# Patient Record
Sex: Female | Born: 1945 | Race: Black or African American | Hispanic: No | Marital: Married | Smoking: Former smoker
Health system: Southern US, Community
[De-identification: ages and names within clinical notes are randomized; demographics above are authoritative.]

## PROBLEM LIST (undated history)

## (undated) DIAGNOSIS — I10 Essential (primary) hypertension: Secondary | ICD-10-CM

## (undated) DIAGNOSIS — M1990 Unspecified osteoarthritis, unspecified site: Secondary | ICD-10-CM

## (undated) DIAGNOSIS — C50919 Malignant neoplasm of unspecified site of unspecified female breast: Secondary | ICD-10-CM

## (undated) DIAGNOSIS — Z803 Family history of malignant neoplasm of breast: Secondary | ICD-10-CM

## (undated) DIAGNOSIS — I639 Cerebral infarction, unspecified: Secondary | ICD-10-CM

## (undated) DIAGNOSIS — R0602 Shortness of breath: Secondary | ICD-10-CM

## (undated) DIAGNOSIS — K219 Gastro-esophageal reflux disease without esophagitis: Secondary | ICD-10-CM

## (undated) DIAGNOSIS — G4730 Sleep apnea, unspecified: Secondary | ICD-10-CM

## (undated) HISTORY — DX: Family history of malignant neoplasm of breast: Z80.3

## (undated) HISTORY — DX: Cerebral infarction, unspecified: I63.9

## (undated) HISTORY — PX: NO PAST SURGERIES: SHX2092

---

## 2001-02-09 ENCOUNTER — Ambulatory Visit (HOSPITAL_COMMUNITY): Admission: RE | Admit: 2001-02-09 | Discharge: 2001-02-09 | Payer: Self-pay | Admitting: Internal Medicine

## 2001-02-09 ENCOUNTER — Encounter: Payer: Self-pay | Admitting: Internal Medicine

## 2001-02-09 ENCOUNTER — Encounter: Admission: RE | Admit: 2001-02-09 | Discharge: 2001-02-09 | Payer: Self-pay | Admitting: Internal Medicine

## 2001-04-21 ENCOUNTER — Encounter: Admission: RE | Admit: 2001-04-21 | Discharge: 2001-04-21 | Payer: Self-pay | Admitting: Internal Medicine

## 2001-05-18 ENCOUNTER — Encounter: Admission: RE | Admit: 2001-05-18 | Discharge: 2001-05-18 | Payer: Self-pay | Admitting: Internal Medicine

## 2001-05-19 ENCOUNTER — Encounter: Payer: Self-pay | Admitting: Internal Medicine

## 2001-05-19 ENCOUNTER — Ambulatory Visit (HOSPITAL_COMMUNITY): Admission: RE | Admit: 2001-05-19 | Discharge: 2001-05-19 | Payer: Self-pay | Admitting: Internal Medicine

## 2001-05-19 ENCOUNTER — Encounter (INDEPENDENT_AMBULATORY_CARE_PROVIDER_SITE_OTHER): Payer: Self-pay | Admitting: Internal Medicine

## 2004-06-30 ENCOUNTER — Emergency Department (HOSPITAL_COMMUNITY): Admission: EM | Admit: 2004-06-30 | Discharge: 2004-06-30 | Payer: Self-pay | Admitting: Emergency Medicine

## 2004-07-06 ENCOUNTER — Ambulatory Visit: Payer: Self-pay | Admitting: Internal Medicine

## 2004-07-13 ENCOUNTER — Ambulatory Visit: Payer: Self-pay | Admitting: Internal Medicine

## 2004-07-31 ENCOUNTER — Ambulatory Visit (HOSPITAL_COMMUNITY): Admission: RE | Admit: 2004-07-31 | Discharge: 2004-07-31 | Payer: Self-pay | Admitting: Internal Medicine

## 2004-07-31 ENCOUNTER — Ambulatory Visit: Payer: Self-pay | Admitting: Internal Medicine

## 2004-08-08 ENCOUNTER — Ambulatory Visit (HOSPITAL_COMMUNITY): Admission: RE | Admit: 2004-08-08 | Discharge: 2004-08-08 | Payer: Self-pay | Admitting: Internal Medicine

## 2004-08-08 ENCOUNTER — Ambulatory Visit: Payer: Self-pay | Admitting: Cardiology

## 2004-08-14 ENCOUNTER — Ambulatory Visit: Payer: Self-pay | Admitting: Internal Medicine

## 2004-08-21 ENCOUNTER — Ambulatory Visit: Payer: Self-pay | Admitting: Internal Medicine

## 2004-09-04 ENCOUNTER — Ambulatory Visit: Payer: Self-pay | Admitting: Internal Medicine

## 2005-03-19 ENCOUNTER — Ambulatory Visit: Payer: Self-pay | Admitting: Hospitalist

## 2005-03-26 ENCOUNTER — Ambulatory Visit: Payer: Self-pay | Admitting: Hospitalist

## 2005-04-09 ENCOUNTER — Ambulatory Visit: Payer: Self-pay | Admitting: Internal Medicine

## 2006-04-30 DIAGNOSIS — F1021 Alcohol dependence, in remission: Secondary | ICD-10-CM

## 2006-04-30 DIAGNOSIS — K219 Gastro-esophageal reflux disease without esophagitis: Secondary | ICD-10-CM | POA: Insufficient documentation

## 2006-04-30 DIAGNOSIS — M545 Low back pain: Secondary | ICD-10-CM | POA: Insufficient documentation

## 2006-04-30 DIAGNOSIS — I10 Essential (primary) hypertension: Secondary | ICD-10-CM

## 2006-07-03 DIAGNOSIS — K5900 Constipation, unspecified: Secondary | ICD-10-CM

## 2006-07-03 DIAGNOSIS — Z87891 Personal history of nicotine dependence: Secondary | ICD-10-CM

## 2006-07-03 DIAGNOSIS — K649 Unspecified hemorrhoids: Secondary | ICD-10-CM

## 2011-12-12 ENCOUNTER — Encounter (HOSPITAL_COMMUNITY): Payer: Self-pay | Admitting: *Deleted

## 2011-12-12 ENCOUNTER — Emergency Department (HOSPITAL_COMMUNITY)
Admission: EM | Admit: 2011-12-12 | Discharge: 2011-12-13 | Disposition: A | Discharge status: Home Health | Payer: Medicare HMO | Attending: Emergency Medicine | Admitting: Emergency Medicine

## 2011-12-12 ENCOUNTER — Emergency Department (HOSPITAL_COMMUNITY): Payer: Medicare HMO

## 2011-12-12 DIAGNOSIS — Z79899 Other long term (current) drug therapy: Secondary | ICD-10-CM | POA: Insufficient documentation

## 2011-12-12 DIAGNOSIS — M129 Arthropathy, unspecified: Secondary | ICD-10-CM | POA: Insufficient documentation

## 2011-12-12 DIAGNOSIS — R5381 Other malaise: Secondary | ICD-10-CM | POA: Insufficient documentation

## 2011-12-12 DIAGNOSIS — K219 Gastro-esophageal reflux disease without esophagitis: Secondary | ICD-10-CM | POA: Insufficient documentation

## 2011-12-12 DIAGNOSIS — N390 Urinary tract infection, site not specified: Secondary | ICD-10-CM

## 2011-12-12 DIAGNOSIS — R42 Dizziness and giddiness: Secondary | ICD-10-CM | POA: Insufficient documentation

## 2011-12-12 DIAGNOSIS — I10 Essential (primary) hypertension: Secondary | ICD-10-CM | POA: Insufficient documentation

## 2011-12-12 DIAGNOSIS — R531 Weakness: Secondary | ICD-10-CM

## 2011-12-12 HISTORY — DX: Essential (primary) hypertension: I10

## 2011-12-12 HISTORY — DX: Unspecified osteoarthritis, unspecified site: M19.90

## 2011-12-12 HISTORY — DX: Gastro-esophageal reflux disease without esophagitis: K21.9

## 2011-12-12 LAB — CBC WITH DIFFERENTIAL/PLATELET
Basophils Absolute: 0.1 10*3/uL (ref 0.0–0.1)
Basophils Relative: 0 % (ref 0–1)
Eosinophils Absolute: 0.1 10*3/uL (ref 0.0–0.7)
Eosinophils Relative: 0 % (ref 0–5)
HCT: 40.2 % (ref 36.0–46.0)
Hemoglobin: 13 g/dL (ref 12.0–15.0)
Lymphocytes Relative: 11 % — ABNORMAL LOW (ref 12–46)
Lymphs Abs: 1.9 10*3/uL (ref 0.7–4.0)
MCH: 27.7 pg (ref 26.0–34.0)
MCHC: 32.3 g/dL (ref 30.0–36.0)
MCV: 85.7 fL (ref 78.0–100.0)
Monocytes Absolute: 1.5 10*3/uL — ABNORMAL HIGH (ref 0.1–1.0)
Monocytes Relative: 9 % (ref 3–12)
Neutro Abs: 13.7 10*3/uL — ABNORMAL HIGH (ref 1.7–7.7)
Neutrophils Relative %: 80 % — ABNORMAL HIGH (ref 43–77)
Platelets: 197 10*3/uL (ref 150–400)
RBC: 4.69 MIL/uL (ref 3.87–5.11)
RDW: 14.2 % (ref 11.5–15.5)
WBC: 17.2 10*3/uL — ABNORMAL HIGH (ref 4.0–10.5)

## 2011-12-12 LAB — URINALYSIS, ROUTINE W REFLEX MICROSCOPIC
Bilirubin Urine: NEGATIVE
Glucose, UA: NEGATIVE mg/dL
Ketones, ur: NEGATIVE mg/dL
Leukocytes, UA: NEGATIVE
Nitrite: NEGATIVE
Protein, ur: 30 mg/dL — AB
Specific Gravity, Urine: 1.018 (ref 1.005–1.030)
Urobilinogen, UA: 1 mg/dL (ref 0.0–1.0)
pH: 5.5 (ref 5.0–8.0)

## 2011-12-12 LAB — URINE MICROSCOPIC-ADD ON

## 2011-12-12 LAB — COMPREHENSIVE METABOLIC PANEL
ALT: 12 U/L (ref 0–35)
AST: 17 U/L (ref 0–37)
Albumin: 3.8 g/dL (ref 3.5–5.2)
Alkaline Phosphatase: 77 U/L (ref 39–117)
BUN: 17 mg/dL (ref 6–23)
CO2: 26 mEq/L (ref 19–32)
Calcium: 9.5 mg/dL (ref 8.4–10.5)
Chloride: 98 mEq/L (ref 96–112)
Creatinine, Ser: 1.23 mg/dL — ABNORMAL HIGH (ref 0.50–1.10)
GFR calc Af Amer: 52 mL/min — ABNORMAL LOW (ref >90)
GFR calc non Af Amer: 45 mL/min — ABNORMAL LOW (ref >90)
Glucose, Bld: 111 mg/dL — ABNORMAL HIGH (ref 70–99)
Potassium: 3.7 mEq/L (ref 3.5–5.1)
Sodium: 136 mEq/L (ref 135–145)
Total Bilirubin: 0.5 mg/dL (ref 0.3–1.2)
Total Protein: 8.7 g/dL — ABNORMAL HIGH (ref 6.0–8.3)

## 2011-12-12 MED ORDER — SODIUM CHLORIDE 0.9 % IV BOLUS (SEPSIS)
1000.0000 mL | Freq: Once | INTRAVENOUS | Status: AC
  Administered 2011-12-12: 1000 mL via INTRAVENOUS

## 2011-12-12 MED ORDER — CIPROFLOXACIN HCL 500 MG PO TABS: 500.0000 mg | ORAL_TABLET | Freq: Two times a day (BID) | ORAL | Status: AC

## 2011-12-12 MED ORDER — DEXTROSE 5 % IV SOLN
1.0000 g | Freq: Once | INTRAVENOUS | Status: AC
  Administered 2011-12-12: 1 g via INTRAVENOUS
  Filled 2011-12-12: qty 10

## 2011-12-12 NOTE — ED Notes (Signed)
Pt. Attempted to urinate, but unsuccessful. Will continue to monitor.

## 2011-12-12 NOTE — Discharge Instructions (Signed)
Fatigue Fatigue is a feeling of tiredness, lack of energy, lack of motivation, or feeling tired all the time. Having enough rest, good nutrition, and reducing stress will normally reduce fatigue. Consult your caregiver if it persists. The nature of your fatigue will help your caregiver to find out its cause. The treatment is based on the cause.  CAUSES  There are many causes for fatigue. Most of the time, fatigue can be traced to one or more of your habits or routines. Most causes fit into one or more of three general areas. They are: Lifestyle problems  Sleep disturbances.   Overwork.   Physical exertion.   Unhealthy habits.   Poor eating habits or eating disorders.   Alcohol and/or drug use .   Lack of proper nutrition (malnutrition).  Psychological problems  Stress and/or anxiety problems.   Depression.   Grief.   Boredom.  Medical Problems or Conditions  Anemia.   Pregnancy.   Thyroid gland problems.   Recovery from major surgery.   Continuous pain.   Emphysema or asthma that is not well controlled   Allergic conditions.   Diabetes.   Infections (such as mononucleosis).   Obesity.   Sleep disorders, such as sleep apnea.   Heart failure or other heart-related problems.   Cancer.   Kidney disease.   Liver disease.   Effects of certain medicines such as antihistamines, cough and cold remedies, prescription pain medicines, heart and blood pressure medicines, drugs used for treatment of cancer, and some antidepressants.  SYMPTOMS  The symptoms of fatigue include:   Lack of energy.   Lack of drive (motivation).   Drowsiness.   Feeling of indifference to the surroundings.  DIAGNOSIS  The details of how you feel help guide your caregiver in finding out what is causing the fatigue. You will be asked about your present and past health condition. It is important to review all medicines that you take, including prescription and non-prescription items. A  thorough exam will be done. You will be questioned about your feelings, habits, and normal lifestyle. Your caregiver may suggest blood tests, urine tests, or other tests to look for common medical causes of fatigue.  TREATMENT  Fatigue is treated by correcting the underlying cause. For example, if you have continuous pain or depression, treating these causes will improve how you feel. Similarly, adjusting the dose of certain medicines will help in reducing fatigue.  HOME CARE INSTRUCTIONS   Try to get the required amount of good sleep every night.   Eat a healthy and nutritious diet, and drink enough water throughout the day.   Practice ways of relaxing (including yoga or meditation).   Exercise regularly.   Make plans to change situations that cause stress. Act on those plans so that stresses decrease over time. Keep your work and personal routine reasonable.   Avoid street drugs and minimize use of alcohol.   Start taking a daily multivitamin after consulting your caregiver.  SEEK MEDICAL CARE IF:   You have persistent tiredness, which cannot be accounted for.   You have fever.   You have unintentional weight loss.   You have headaches.   You have disturbed sleep throughout the night.   You are feeling sad.   You have constipation.   You have dry skin.   You have gained weight.   You are taking any new or different medicines that you suspect are causing fatigue.   You are unable to sleep at night.     You develop any unusual swelling of your legs or other parts of your body.  SEEK IMMEDIATE MEDICAL CARE IF:   You are feeling confused.   Your vision is blurred.   You feel faint or pass out.   You develop severe headache.   You develop severe abdominal, pelvic, or back pain.   You develop chest pain, shortness of breath, or an irregular or fast heartbeat.   You are unable to pass a normal amount of urine.   You develop abnormal bleeding such as bleeding from  the rectum or you vomit blood.   You have thoughts about harming yourself or committing suicide.   You are worried that you might harm someone else.  MAKE SURE YOU:   Understand these instructions.   Will watch your condition.   Will get help right away if you are not doing well or get worse.  Document Released: 03/31/2007 Document Revised: 05/23/2011 Document Reviewed: 03/31/2007 [facility] Patient Information 2012 [place], [place].Urinary Tract Infection Infections of the urinary tract can start in several places. A bladder infection (cystitis), a kidney infection (pyelonephritis), and a prostate infection (prostatitis) are different types of urinary tract infections (UTIs). They usually get better if treated with medicines (antibiotics) that kill germs. Take all the medicine until it is gone. You or your child may feel better in a few days, but TAKE ALL MEDICINE or the infection may not respond and may become more difficult to treat. HOME CARE INSTRUCTIONS   Drink enough water and fluids to keep the urine clear or pale yellow. Cranberry juice is especially recommended, in addition to large amounts of water.   Avoid caffeine, tea, and carbonated beverages. They tend to irritate the bladder.   Alcohol may irritate the prostate.   Only take over-the-counter or prescription medicines for pain, discomfort, or fever as directed by your caregiver.  To prevent further infections:  Empty the bladder often. Avoid holding urine for long periods of time.   After a bowel movement, women should cleanse from front to back. Use each tissue only once.   Empty the bladder before and after sexual intercourse.  FINDING OUT THE RESULTS OF YOUR TEST Not all test results are available during your visit. If your or your child's test results are not back during the visit, make an appointment with your caregiver to find out the results. Do not assume everything is normal if you have not heard from your  caregiver or the medical facility. It is important for you to follow up on all test results. SEEK MEDICAL CARE IF:   There is back pain.   Your baby is older than 3 months with a rectal temperature of 100.5 F (38.1 C) or higher for more than 1 day.   Your or your child's problems (symptoms) are no better in 3 days. Return sooner if you or your child is getting worse.  SEEK IMMEDIATE MEDICAL CARE IF:   There is severe back pain or lower abdominal pain.   You or your child develops chills.   You have a fever.   Your baby is older than 3 months with a rectal temperature of 102 F (38.9 C) or higher.   Your baby is 8 months old or younger with a rectal temperature of 100.4 F (38 C) or higher.   There is nausea or vomiting.   There is continued burning or discomfort with urination.  MAKE SURE YOU:   Understand these instructions.   Will watch your condition.  Will get help right away if you are not doing well or get worse.  Document Released: 03/13/2005 Document Revised: 05/23/2011 Document Reviewed: 10/16/2006 [facility] Patient Information 2012 [place], [place].

## 2011-12-12 NOTE — ED Notes (Signed)
EKG old and new given to EDP, Beaton,MD.

## 2011-12-12 NOTE — ED Notes (Signed)
Pt states she has had frequent urination. Pt also c/o dizziness and weakness. Pt states it is becoming difficult for her swallowing. Pt daughter state patient was looking funny. Pt denies any loc

## 2011-12-13 NOTE — ED Provider Notes (Signed)
History    66yF with generalized fatigue. Onset a couple days ago. Persistent and stable. Urinating more than usual but no other urinary complaints. No fever or chills. No n/v/d. No trauma. NO new meds. Denies ingestion. Does endorse depression. No difficulty breathing but sometimes feels like things get stuck when swallows. No sore throat. No change in voice. No cp or sob. No acute numbness or tingling. No focal loss of strength. No HA. No acute visual chnages.   CSN: [id number]  Arrival date & time 12/12/11  4098   First MD Initiated Contact with Patient 12/12/11 1925      Chief Complaint  Patient presents with  . Dizziness  . Weakness    (Consider location/radiation/quality/duration/timing/severity/associated sxs/prior treatment) HPI  Past Medical History  Diagnosis Date  . GERD (gastroesophageal reflux disease)   . Arthritis   . Hypertension     History reviewed. No pertinent past surgical history.  No family history on file.  History  Substance Use Topics  . Smoking status: Never Smoker   . Smokeless tobacco: Not on file  . Alcohol Use: No    OB History    Grav Para Term Preterm Abortions TAB SAB Ect Mult Living                  Review of Systems   Review of symptoms negative unless otherwise noted in HPI.   Allergies  Review of patient's allergies indicates no known allergies.  Home Medications   Current Outpatient Rx  Name Route Sig Dispense Refill  . CIPROFLOXACIN HCL 500 MG PO TABS Oral Take 1 tablet (500 mg total) by mouth 2 (two) times daily. 14 tablet 0    BP 185/89  Pulse 87  Temp 100.1 F (37.8 C) (Rectal)  Resp 20  SpO2 94%  Physical Exam  Nursing note and vitals reviewed. Constitutional: She is oriented to person, place, and time. She appears well-developed and well-nourished. No distress.  HENT:  Head: Normocephalic and atraumatic.  Eyes: Conjunctivae are normal. Pupils are equal, round, and reactive to light. Right eye exhibits  no discharge. Left eye exhibits no discharge.  Neck: Normal range of motion. Neck supple.  Cardiovascular: Normal rate, regular rhythm and normal heart sounds.  Exam reveals no gallop and no friction rub.   No murmur heard. Pulmonary/Chest: Effort normal and breath sounds normal. No respiratory distress.  Abdominal: Soft. She exhibits no distension. There is no tenderness.  Genitourinary:       No cva tenderness.  Musculoskeletal: She exhibits no edema and no tenderness.  Lymphadenopathy:    She has no cervical adenopathy.  Neurological: She is alert and oriented to person, place, and time. No cranial nerve deficit. She exhibits normal muscle tone. Coordination normal.       Speech clear and content appropriate. Good finger to nose b/l. Gait steady.  Skin: Skin is warm and dry.  Psychiatric: She has a normal mood and affect. Her behavior is normal. Thought content normal.    ED Course  Procedures (including critical care time)  Labs Reviewed  URINALYSIS, ROUTINE W REFLEX MICROSCOPIC - Abnormal; Notable for the following:    APPearance CLOUDY (*)     Hgb urine dipstick TRACE (*)     Protein, ur 30 (*)     All other components within normal limits  CBC WITH DIFFERENTIAL - Abnormal; Notable for the following:    WBC 17.2 (*)     Neutrophils Relative 80 (*)  Neutro Abs 13.7 (*)     Lymphocytes Relative 11 (*)     Monocytes Absolute 1.5 (*)     All other components within normal limits  COMPREHENSIVE METABOLIC PANEL - Abnormal; Notable for the following:    Glucose, Bld 111 (*)     Creatinine, Ser 1.23 (*)     Total Protein 8.7 (*)     GFR calc non Af Amer 45 (*)     GFR calc Af Amer 52 (*)     All other components within normal limits  URINE MICROSCOPIC-ADD ON - Abnormal; Notable for the following:    Squamous Epithelial / LPF MANY (*)     Bacteria, UA FEW (*)     All other components within normal limits   Dg Chest 2 View  12/12/2011  *RADIOLOGY REPORT*  Clinical Data:  Fever with cough and dizziness.  Difficulty swallowing.  CHEST - 2 VIEW  Comparison: None.  Findings: Cardiomegaly.  Calcified tortuous aorta.  Mild vascular congestion without overt failure or infiltrates.  Degenerative change thoracic spine. No effusion or pneumothorax.  IMPRESSION: Cardiomegaly, mild vascular congestion.  No active infiltrates or overt failure.  Original Report Authenticated By: Elsie Stain, M.D.   EKG:  Rhythm: nsr Rate: 82 Axis: left Intervals: normal ST segments: NS St changes    1. UTI (lower urinary tract infection)   2. Generalized weakness       MDM  66yF with generalized weakness. Questionable UTI. Given fatigue and leukocytosis will tx. Do not feel that needs admit at this time. Nonfocal neuro exam. Handling PO in ED. Return precautions discussed.         Raeford Razor, MD 12/19/11 [phone]

## 2012-03-04 ENCOUNTER — Ambulatory Visit (HOSPITAL_COMMUNITY)
Admission: RE | Admit: 2012-03-04 | Discharge: 2012-03-04 | Disposition: A | Discharge status: Home / Self Care | Payer: Medicare HMO | Source: Ambulatory Visit | Attending: Orthopedic Surgery | Admitting: Orthopedic Surgery

## 2012-03-04 ENCOUNTER — Encounter: Payer: Self-pay | Admitting: Internal Medicine

## 2012-03-04 ENCOUNTER — Ambulatory Visit (INDEPENDENT_AMBULATORY_CARE_PROVIDER_SITE_OTHER): Payer: Medicare HMO | Admitting: Internal Medicine

## 2012-03-04 VITALS — BP 191/101 | HR 75 | Temp 98.1°F | Ht 64.0 in | Wt 232.1 lb

## 2012-03-04 DIAGNOSIS — R609 Edema, unspecified: Secondary | ICD-10-CM

## 2012-03-04 DIAGNOSIS — R0602 Shortness of breath: Secondary | ICD-10-CM

## 2012-03-04 DIAGNOSIS — R9431 Abnormal electrocardiogram [ECG] [EKG]: Secondary | ICD-10-CM | POA: Insufficient documentation

## 2012-03-04 DIAGNOSIS — M545 Low back pain, unspecified: Secondary | ICD-10-CM

## 2012-03-04 DIAGNOSIS — Z0000 Encounter for general adult medical examination without abnormal findings: Secondary | ICD-10-CM

## 2012-03-04 DIAGNOSIS — R5383 Other fatigue: Secondary | ICD-10-CM

## 2012-03-04 DIAGNOSIS — M25561 Pain in right knee: Secondary | ICD-10-CM

## 2012-03-04 DIAGNOSIS — R42 Dizziness and giddiness: Secondary | ICD-10-CM

## 2012-03-04 DIAGNOSIS — K219 Gastro-esophageal reflux disease without esophagitis: Secondary | ICD-10-CM

## 2012-03-04 DIAGNOSIS — G479 Sleep disorder, unspecified: Secondary | ICD-10-CM

## 2012-03-04 DIAGNOSIS — E669 Obesity, unspecified: Secondary | ICD-10-CM

## 2012-03-04 DIAGNOSIS — R5381 Other malaise: Secondary | ICD-10-CM

## 2012-03-04 DIAGNOSIS — R600 Localized edema: Secondary | ICD-10-CM | POA: Insufficient documentation

## 2012-03-04 DIAGNOSIS — I517 Cardiomegaly: Secondary | ICD-10-CM

## 2012-03-04 DIAGNOSIS — Z23 Encounter for immunization: Secondary | ICD-10-CM

## 2012-03-04 DIAGNOSIS — Z87891 Personal history of nicotine dependence: Secondary | ICD-10-CM

## 2012-03-04 DIAGNOSIS — M25569 Pain in unspecified knee: Secondary | ICD-10-CM

## 2012-03-04 DIAGNOSIS — I10 Essential (primary) hypertension: Secondary | ICD-10-CM

## 2012-03-04 DIAGNOSIS — N189 Chronic kidney disease, unspecified: Secondary | ICD-10-CM

## 2012-03-04 DIAGNOSIS — K644 Residual hemorrhoidal skin tags: Secondary | ICD-10-CM

## 2012-03-04 DIAGNOSIS — Z1231 Encounter for screening mammogram for malignant neoplasm of breast: Secondary | ICD-10-CM

## 2012-03-04 DIAGNOSIS — F1021 Alcohol dependence, in remission: Secondary | ICD-10-CM

## 2012-03-04 LAB — CBC
Hemoglobin: 13.1 g/dL (ref 12.0–15.0)
MCH: 27.4 pg (ref 26.0–34.0)
MCHC: 32.7 g/dL (ref 30.0–36.0)
RDW: 14.7 % (ref 11.5–15.5)
WBC: 7.6 10*3/uL (ref 4.0–10.5)

## 2012-03-04 LAB — LIPID PANEL
Cholesterol: 220 mg/dL — ABNORMAL HIGH (ref 0–200)
HDL: 36 mg/dL — ABNORMAL LOW (ref >39)
Total CHOL/HDL Ratio: 6.1 Ratio
VLDL: 33 mg/dL (ref 0–40)

## 2012-03-04 MED ORDER — METOPROLOL SUCCINATE ER 25 MG PO TB24: 12.5000 mg | ORAL_TABLET | Freq: Every day | ORAL | Status: DC

## 2012-03-04 MED ORDER — CHLORTHALIDONE 25 MG PO TABS: 12.5000 mg | ORAL_TABLET | Freq: Every day | ORAL | Status: DC

## 2012-03-04 NOTE — Assessment & Plan Note (Signed)
Left axis deviation on ekg, cardiomegaly on cxr. Likely secondary to uncontrolled chronic hypertension.  -Followup echo -Chest x-ray -Start hypertensive medication including metoprolol and chlorthalidone, will likely need to increase dosage with regular followup. -Continue to monitor

## 2012-03-04 NOTE — Assessment & Plan Note (Signed)
Trouble sleeping at night for several years. Claims to nap several times during the day and has trouble sleeping at night, uses 2-3 pillows and lays on her side.  Denies feeling short of breath or feeling like she cant catch her breath at night.  Possibly secondary to poor sleep hygiene vs. OSA vs. Orthopnea? Discussed improve methods of sleep hygiene, less napping during the day to improve sleep in bed at night, weight loss, and possible further investigation of sleep apnea if needed in the future.   -continue to monitor -f/u echo, cxr

## 2012-03-04 NOTE — Assessment & Plan Note (Addendum)
Possibly decreased breath sounds bilaterally, chronic shortness of breath with exertion even with short distances walking from car to clinic today is an example, tachycardia upon exertion, sleeps with 2-3 pillows at night and sleeps on side but does not sleep well, previous cxr from June 2013 shows cardiomegaly.  Hx of smoking in the 1970s, 1 pack/day for at least one year but then stopped. Associated with occasional dizziness.  May be secondary to CHF.   EKG 03/04/12 ABNORMAL: 69bpm NSR, left axis deviation, t wave changes minor flattening and inversions, leads I,V2, no chest pain.  Compared to last ekg from 11/2011  -f/u echo--scheduled for 03/10/12 -f/u cxr--can get done today -continue to monitor

## 2012-03-04 NOTE — Progress Notes (Signed)
Subjective:   Patient ID: [name] female   DOB: 06/18/1945 66 y.o.   MRN: [id number]  HPI: [name] is a 66 y.o. pleasant African American female with past medical history of uncontrolled hypertension who does not regularly followup with primary care physician and last hospital visit was in June 2013 for possible UTI treated with ciprofloxacin.  She also endorses a past medical history of acid reflux, lower back pain, and bilateral knee pain. She was previously prescribed hydrochlorothiazide 25 mg daily in 2003 with her last filled prescription noted to be in 2004. She claims to be non-adherent with medication and does not like taking medications, and did not regularly take her blood pressure medications. She does not check her blood pressure. She comes in today to reestablish care, after being encouraged to go to the doctor by her family members.  Today, [name] endorses occasional dizziness upon standing and walking, shortness of breath even on short distances for example walking from her car to clinic today, fatigue, lower back pain, bilateral knee pain, and lower extremity edema.  She claims to be having all these symptoms for several years. She does not claim that the shortness of breath has gotten worse, but does endorse sleeping with approximately 2-3 pillows at night, and does not sleep well throughout the night having to sleep on her side. She does nap several times throughout the day. She also reports weight gain most likely secondary to not following a healthy diet. She worked at [employer] for several years but is now currently getting disability secondary to chronic pain. She also endorses acid reflux worse at night especially when having a heavy and fatty meal. She does not like taking medications and does not want to take any unnecessary medications at this time.  Finally she notes two episodes in August 2013 of bright red blood streaking in her stool.  She thought it was secondary to her history of hemorrhoids. The bleeding spontaneously resolved and she denies any pain, constipation or diarrhea since.  She endorses a strong malodorous smell in her urine for several months now, but denies any dysuria, hematuria, urgency, frequency, or bladder pressure. She does not drink much if any water but does drink several sodas in particular Pepsi daily.  She reports no other major complaints at this time. She denies any fever, headaches, chest pain, nausea, vomiting, vision disturbances, sore throat, ear pain, and abdominal pain.  Because she has not been to a primary care physician in many years, I discussed with her in detail issues in regards to health maintenance. She has agreed to get her tetanus and pneumococcal vaccinations today but would like to defer flu vaccination until her next followup visit. She will also reconsider getting a Pap smear on followup visit. She did agree to get referrals for mammogram, colonoscopy, and DEXA scan. She is unsure if her insurance will cover her for Zostavax vaccine and would like to investigate further before her getting the prescription. I will also do baseline lab work and urinalysis if she can produce a sufficient sample. I discussed with her in detail in regards to weight loss, diet modifications, exercise as tolerated. And also went over her previous chest x-ray findings which showed cardiomegaly. I also discussed extensively nonadherence with medications and the need to properly adhere to her blood pressure medications which we will start this visit. Her blood pressure in clinic today was noted to be 191/101.  Dr. Kem Kays and I discussed with  her in detail in regards to the possibility of heart failure secondary to the symptoms she is exhibiting in particular shortness of breath upon exertion, left axis deviation on EKG, cardiomegaly on chest x-ray, lower extremity edema, and fatigue. We will need to run more tests for further  examination and she is in agreement.  Past Medical History  Diagnosis Date  . GERD (gastroesophageal reflux disease)   . Arthritis   . Hypertension    Current Outpatient Prescriptions  Medication Sig Dispense Refill  . chlorthalidone (HYGROTON) 25 MG tablet Take 0.5 tablets (12.5 mg total) by mouth daily.  30 tablet  5  . metoprolol succinate (TOPROL XL) 25 MG 24 hr tablet Take 0.5 tablets (12.5 mg total) by mouth daily.  30 tablet  11   History reviewed. No pertinent family history. History   Social History  . Marital Status: Married    Spouse Name: N/A    Number of Children: N/A  . Years of Education: N/A   Social History Main Topics  . Smoking status: Former Smoker    Types: Cigarettes    Start date: 06/17/1968  . Smokeless tobacco: None  . Alcohol Use: No  . Drug Use: No  . Sexually Active: No   Other Topics Concern  . None   Social History Narrative  . None   Review of Systems: Constitutional:  Fatigue.Denies fever, chills, diaphoresis, appetite change. HEENT:  Difficulty hearing.Denies photophobia, eye pain, redness, ear pain, congestion, sore throat, rhinorrhea, sneezing, mouth sores, trouble swallowing, neck pain, neck stiffness and tinnitus.   Respiratory:  Shortness of breath and dyspnea on exertion.Denies cough, chest tightness,  and wheezing.   Cardiovascular:  Leg swelling, occasional heart flutter. Denies chest pain, palpitations.  Gastrointestinal:  Acid reflux, history of hemorrhoids with one episode in August of bright red blood streaks in stool.Denies nausea, vomiting, abdominal pain, diarrhea, constipation,and abdominal distention.  Genitourinary:  Malodorous urine.Denies dysuria, urgency, frequency, hematuria, flank pain and difficulty urinating.  Musculoskeletal:  Chronic lower back pain and bilateral knee pain, bilateral hand and feet swelling, and difficulty walking. Skin: Denies pallor, rash and wound.  Neurological:  Occasional dizziness. Denies  seizures, syncope, weakness, light-headedness, numbness and headaches.  Hematological: Denies adenopathy. Easy bruising, personal or family bleeding history  Psychiatric/Behavioral: Sleep disturbances at night. Denies suicidal ideation, mood changes, confusion, nervousness, and agitation.  Objective:  Physical Exam: Filed Vitals:   03/04/12 1444 03/04/12 1459 03/04/12 1500 03/04/12 1502  BP: 197/90 182/94 191/96 191/101  Pulse: 71 73 96 75  Temp: 98.1 F (36.7 C)     TempSrc: Oral     Height: 5\' 4"  (1.626 m)     Weight: 232 lb 1.6 oz (105.28 kg)     SpO2: 95%      Constitutional: Vital signs reviewed.  Patient is a well-developed and well-nourished female in no acute distress and cooperative with exam. Alert and oriented x3.  Head: Normocephalic and atraumatic Ear: TM normal bilaterally Mouth: no erythema or exudates, MMM Eyes: PERRLA, EOMI, conjunctivae normal, No scleral icterus.  Neck: Supple, Trachea midline normal ROM Cardiovascular: RRR, S1 normal, S2 normal, +  2/6 holosystolic murmur most prominent left sternal border ,pulses symmetric and intact bilaterally Pulmonary/Chest: CTAB, possible decreased breath sounds bilaterally in upper lobes, no wheezes, rales, or rhonchi Abdominal: Soft.  Obese,Non-tender, non-distended, bowel sounds are normal, no masses, organomegaly, or guarding present.  GU: no CVA tenderness Musculoskeletal:  +1 pitting edema bilateral lower extremities, mild edema of bilateral hands, ROM  full and nontender, + 2D/P b/l Neurological: A&O x3, Strength is normal and symmetric bilaterally, cranial nerve II-XII are grossly intact, no focal motor deficit, sensory intact to light touch bilaterally.  Skin: Warm, dry and intact. No rash, cyanosis, or clubbing.  Psychiatric: Normal mood and affect. speech and behavior is normal. Judgment and thought content normal. Cognition and memory are normal.   Assessment & Plan:  Case discussed with Dr. Kem Kays  Appointment  for re-establishment of care.  HTN: started on metoprolol xl and chlorthalidone 12.5mg  PO QD Possible CHF with symptoms of dyspnea on exertion, fatigue, b/l lower extremity edema, and cardiomegaly--repeat ekg done, f/u echo, f/u cxr CKD: unable to give urine sample today, recollect next visit, f/u cmp, urine protein/cr Health maintenance: Tdap and pneumococcal vaccine given today, influenza and pap smear deferred to next visit by patient, referrals for mammogram, colonoscopy, and dexa scan given today.    F/u cbc, cmp, tsh, echo, cxr, lipid panel, and f/u visit 3 weeks--recheck bmp and K+ level at that time s/p started chlorthalidone

## 2012-03-04 NOTE — Assessment & Plan Note (Signed)
Claims to have lower back pain for several years in addition to bilateral knee pain. Denies any trauma. Endorses having difficulty moving around for the past several years which she thinks is likely secondary to old age.   -Will continue to monitor

## 2012-03-04 NOTE — Patient Instructions (Signed)
Please go for echocardiogram of your heart and chest xray as scheduled as soon as possible  Please start taking your blood pressure medicine DAILY: Metoprolol and Chlorthalidone once a day each as prescribed, they have been sent to your pharmacy.  If metoprolol is not covered by insurance call the clinic and let us know and we can adjust it as soon as possible.   RETURN TO CLINIC FOR FOLLOW UP APPOINTMENT WITH ME IN 3 WEEKS--we will check your blood test again for potassium level at that time and get a urine sample  Continue drinking lots of water as we discussed  Please follow up with your appointments for colonoscopy, mammogram, and dexa scan (bone test)  Please continue to try to lose weight, portion control, cutting out soda's as much as possible  I will call you with your results for all your tests  We will do flu shot next visit and maybe papsmear if you like  Today you got your pneumococcal and Tdap vaccinations in clinic  Please take care of yourself and see you soon.  Let me know if you have any questions or concerns

## 2012-03-04 NOTE — Assessment & Plan Note (Addendum)
Creatinine noted to be 1.23 in June 2013 when visiting the hospital for possible UTI and found to have leukocytosis of 17 and temp of 100 F, given ciprofloxacin at that time upon discharged .  Possibly secondary to uncontrolled hypertension.    -Followup CMP -Was not able to produce urine sample for urinalysis today, will recollect for urinalysis on next followup visit, urine protein to creatinine ratio also on followup visit -Continue to monitor

## 2012-03-04 NOTE — Assessment & Plan Note (Signed)
Stopped drinking approximately 10 years ago. She denies heavy use of alcohol in the past but does claim to drink occasionally on the weekends before quitting.

## 2012-03-04 NOTE — Assessment & Plan Note (Addendum)
Chronic uncontrolled hypertension. Was previously on hydrochlorothiazide 25 mg in 2003 which she claims to take on and off until 2004 which is the last time she had a prescription filled.  Blood pressure in clinic today noted to be 197/90. Orthostatic vital signs done showing blood pressure 182/94 with a pulse of 73 when lying and blood pressure 191/96 with a heart rate of 96 when sitting.  Also complains of shortness of breath and occasional dizziness. Shortness of breath is worse upon exertion even with short distances but not worsening. Dizziness occurs sometimes upon standing and usually when walking.  Blood pressure in June 2013 was noted to be 185/89. [name] claims to not like taking medication which is why she has been nonadherent in the past. I discussed with her extensively the importance of taking her medications and the effects of long-term uncontrolled hypertension. She is agreed to be adherent with her medication. I will continue to follow her closely in clinic. We will start her on antihypertensive medication today.  -Start Toprol-XL 12.5 mg daily and chlorthalidone 12.5 mg daily; will likely need to adjust dosage with regular follow-up.   -Counseled on weight loss and diet modifications, reducing sodium intake as much as possible -Will continue to monitor -Return to clinic for followup in 3 weeks

## 2012-03-04 NOTE — Assessment & Plan Note (Signed)
Claims to have chronic bilateral lower extremity edema. On physical exam noted to have +1 pitting edema of bilateral legs.  Possibly secondary to congestive heart failure in the setting of uncontrolled chronic hypertension.  Will also check thyroid function.  -Followup TSH -Followup echo -Counseled on weight loss and advised to put her feet up when sitting -Started on chlorthalidone for hypertension, was previously on hydrochlorothiazide in 2003-2004 been nonadherent with medication -Continue to monitor

## 2012-03-04 NOTE — Assessment & Plan Note (Signed)
Used to smoke approximately 2 packs a day in the 1970s for at least one year and then quit.  No recent smoking history.

## 2012-03-04 NOTE — Assessment & Plan Note (Signed)
Claims to have fatigue with decreased energy for several years and associated with shortness of breath and lower extremity edema and weight gain. Possibly secondary to hypothyroidism versus congestive heart failure versus anemia?  -Followup CBC -Followup echo -Followup TSH -Continue to monitor

## 2012-03-04 NOTE — Assessment & Plan Note (Addendum)
Claims to be told that she had hemorrhoids since the birth of her last child.  She denies any pain. She does however endorse 2 days of occasional bright red streaking of blood in stool in August 2013 that spontaneously resolved. She thought it was likely secondary to hemorrhoids. She denies any constipation or diarrhea. I discussed with her colonoscopy screening in which she's in agreement with.  I will refer her during this visit.  No other episodes of bloody bowel movements since August 2013.  -Followup colonoscopy -Continue to monitor

## 2012-03-04 NOTE — Assessment & Plan Note (Addendum)
Intermittent, worse with exertion at times and also upon standing at times.  Spontaneously resolves quickly.  Orthostatic vital signs done in clinic today: 182/94 lying and 191/96 sitting.  Hx of uncontrolled chronic hypertension, cardiomegaly, and shortness of breath worse with exertion.    -f/u lab work -started on antihypertensive medication today--metoprolol and chlorthalidone, expressed importance of daily adherence to medication -continue to monitor -encouraged to increase water intake and decrease soda intake -f/u echo

## 2012-03-04 NOTE — Assessment & Plan Note (Signed)
Counseled on weight loss, exercise, diet control and modifications. Endorses drinking plenty of sodas nondiet daily with very little water intake.  Claims to have significant weight gain over the past few years along with decreased exercise.  Weight was 232 pounds today  -Followup lipid panel -Discussed dietary changes, exercise, weight loss; she claims she will continue to work on it -Will continue to monitor

## 2012-03-04 NOTE — Assessment & Plan Note (Signed)
Claims to have had acid reflex worse with fatty foods and occasionally at night when she eats late. She does not wish to take any medication for her acid reflux at this time. I presented her with several options and will likely discuss with her on followup visit. She does not like to take medication. I counseled her on diet modifications, portion control, 80 and regularly and having dinner earlier at night.  -Will continue to monitor

## 2012-03-04 NOTE — Assessment & Plan Note (Addendum)
Claims to have long history of bilateral knee pain. She claims that she has difficulty walking for the past few years, but thinks that is mainly due to age. I discussed with her the availability of DEXA scanning which she is interested in and would like to do I will refer her this visit.  Bilateral knee x-rays from 2002 show:1. LARGE EFFUSION ON THE LEFT. 2. MILD DEGENERATIVE CHANGES ON THE LEFT AND MINIMAL DEGENERATIVE CHANGES ON THE RIGHT.  -Followup DEXA scan -Continue to monitor her -Takes Tylenol when necessary for pain

## 2012-03-05 LAB — COMPLETE METABOLIC PANEL WITH GFR
AST: 12 U/L (ref 0–37)
Albumin: 4.3 g/dL (ref 3.5–5.2)
Alkaline Phosphatase: 63 U/L (ref 39–117)
BUN: 20 mg/dL (ref 6–23)
Calcium: 9.6 mg/dL (ref 8.4–10.5)
GFR, Est Non African American: 50 mL/min — ABNORMAL LOW
Glucose, Bld: 89 mg/dL (ref 70–99)
Potassium: 4.1 mEq/L (ref 3.5–5.3)
Sodium: 141 mEq/L (ref 135–145)
Total Bilirubin: 0.6 mg/dL (ref 0.3–1.2)
Total Protein: 7.9 g/dL (ref 6.0–8.3)

## 2012-03-09 NOTE — Progress Notes (Signed)
INTERNAL MEDICINE TEACHING ATTENDING ADDENDUM - Jonah Blue, DO: I personally saw and evaluated [name] in this clinic visit in conjunction with the resident, Dr. Virgina Organ. I have discussed patient's plan of care with medical resident during this visit. I have confirmed the physical exam findings and have read and agree with the clinic note including the plan.

## 2012-03-10 ENCOUNTER — Ambulatory Visit (HOSPITAL_COMMUNITY)
Admission: RE | Admit: 2012-03-10 | Discharge: 2012-03-10 | Disposition: A | Discharge status: Home / Self Care | Payer: Medicare HMO | Source: Ambulatory Visit | Attending: Internal Medicine | Admitting: Internal Medicine

## 2012-03-10 DIAGNOSIS — I10 Essential (primary) hypertension: Secondary | ICD-10-CM | POA: Insufficient documentation

## 2012-03-10 DIAGNOSIS — R0602 Shortness of breath: Secondary | ICD-10-CM | POA: Insufficient documentation

## 2012-03-10 DIAGNOSIS — Z0000 Encounter for general adult medical examination without abnormal findings: Secondary | ICD-10-CM

## 2012-03-10 DIAGNOSIS — I059 Rheumatic mitral valve disease, unspecified: Secondary | ICD-10-CM | POA: Insufficient documentation

## 2012-03-10 DIAGNOSIS — I517 Cardiomegaly: Secondary | ICD-10-CM

## 2012-03-10 NOTE — Progress Notes (Signed)
Echocardiogram 2D Echocardiogram has been performed.  [name] 03/10/2012, 12:56 PM

## 2012-03-20 ENCOUNTER — Ambulatory Visit (HOSPITAL_COMMUNITY)
Admission: RE | Admit: 2012-03-20 | Discharge: 2012-03-20 | Disposition: A | Discharge status: Home / Self Care | Payer: Medicare HMO | Source: Ambulatory Visit | Attending: Internal Medicine | Admitting: Internal Medicine

## 2012-03-20 DIAGNOSIS — Z1231 Encounter for screening mammogram for malignant neoplasm of breast: Secondary | ICD-10-CM | POA: Insufficient documentation

## 2012-03-20 DIAGNOSIS — Z0000 Encounter for general adult medical examination without abnormal findings: Secondary | ICD-10-CM

## 2012-03-24 ENCOUNTER — Telehealth: Payer: Self-pay | Admitting: Internal Medicine

## 2012-03-24 ENCOUNTER — Other Ambulatory Visit: Payer: Self-pay | Admitting: Internal Medicine

## 2012-03-24 DIAGNOSIS — N63 Unspecified lump in unspecified breast: Secondary | ICD-10-CM

## 2012-03-24 NOTE — Progress Notes (Signed)
Have this patient seen [place] in the next month. She needs to have U/S performed.

## 2012-03-24 NOTE — Progress Notes (Signed)
Has appt 10/9, dr Everardo Beals

## 2012-03-24 NOTE — Telephone Encounter (Signed)
Telephone Call Addendum:  I called and spoke to [name] this evening to go over the results of her echocardiogram, lab results, cxr, and mammogram.  She understands all her results and is scheduled to come to the clinic tomorrow for her regular follow up appointment with Dr. Everardo Beals in the morning as well.  I reviewed her mammogram findings in details and she claims she was already contacted by the breast center to schedule follow up diagnostic mammogram and ultrasound which she is scheduled to get on Friday 03/27/12.  Otherwise, she claims to be doing well, taking her medications for the most part.  She does admit to missing some days on her BP medication but is back on track.   We will continue to follow up.

## 2012-03-25 ENCOUNTER — Encounter: Payer: Self-pay | Admitting: Internal Medicine

## 2012-03-25 ENCOUNTER — Ambulatory Visit (INDEPENDENT_AMBULATORY_CARE_PROVIDER_SITE_OTHER): Payer: Medicare HMO | Admitting: Internal Medicine

## 2012-03-25 VITALS — BP 181/91 | HR 66 | Temp 97.2°F | Wt 234.1 lb

## 2012-03-25 DIAGNOSIS — I10 Essential (primary) hypertension: Secondary | ICD-10-CM

## 2012-03-25 NOTE — Patient Instructions (Signed)
-  Please take your medication everyday.  Continue the same dose (half a tablet of each).  Incorporate taking your medication into your daily routine - for example, take it everyday with your coffee or when you brush your teeth.  Make sure you take your medication at the same time everyday.  Please be sure to bring all of your medications with you to every visit.  Should you have any new or worsening symptoms, please be sure to call the clinic at [phone].

## 2012-03-25 NOTE — Progress Notes (Signed)
  Subjective:   Patient ID: [name] female   DOB: June 30, 1945 66 y.o.   MRN: [id number]  HPI: [name] is a 66 y.o. woman with h/o HTN, arthritis & GERD presents today for HTN follow up.  Dizziness improved, no complaints and knows about diagnositic mammo on friday  No vision changes / sob  Some sleepiness with medication - missing some doses, no meds today, taking half of both chlorthalidone and metoprolol  Past Medical History  Diagnosis Date  . GERD (gastroesophageal reflux disease)   . Arthritis   . Hypertension    Current Outpatient Prescriptions  Medication Sig Dispense Refill  . chlorthalidone (HYGROTON) 25 MG tablet Take 0.5 tablets (12.5 mg total) by mouth daily.  30 tablet  5  . metoprolol succinate (TOPROL XL) 25 MG 24 hr tablet Take 0.5 tablets (12.5 mg total) by mouth daily.  30 tablet  11   Family History  Problem Relation Age of Onset  . Heart disease Mother   . Cancer Brother    History   Social History  . Marital Status: Married    Spouse Name: N/A    Number of Children: N/A  . Years of Education: N/A   Social History Main Topics  . Smoking status: Former Smoker -- 2.0 packs/day    Types: Cigarettes    Start date: 06/17/1968  . Smokeless tobacco: None   Comment: Quit in 1970s  . Alcohol Use: No     former drinker for approximately 10 years  . Drug Use: No  . Sexually Active: Yes    Birth Control/ Protection: Post-menopausal   Other Topics Concern  . None   Social History Narrative  . None   Review of Systems: General: no fevers, chills, changes in weight, changes in appetite Skin: no rash HEENT: no blurry vision, hearing changes, sore throat Pulm: no dyspnea, coughing, wheezing CV: no chest pain, palpitations, shortness of breath Abd: no abdominal pain, nausea/vomiting, diarrhea/constipation GU: no dysuria, hematuria, polyuria Ext: no arthralgias, myalgias Neuro: no weakness, numbness, or tingling  Objective:    Physical Exam: Filed Vitals:   03/25/12 1048  BP: 181/91  Pulse: 66  Temp: 97.2 F (36.2 C)  TempSrc: Oral  Weight: 234 lb 1.6 oz (106.187 kg)   Constitutional: Vital signs reviewed.  Patient is a well-developed and well-nourished woman in no acute distress and cooperative with exam. Head: Normocephalic and atraumatic Mouth: no erythema or exudates, MMM Eyes: PERRL, EOMI, conjunctivae normal, No scleral icterus.  Neck: Supple, Trachea midline normal ROM, No JVD, mass, thyromegaly, or carotid bruit present.  Cardiovascular: RRR, S1 normal, S2 normal, +systolic murmur at left sternal border, pulses symmetric and intact bilaterally, 1+ b/l LE pitting edema Pulmonary/Chest: CTAB, no wheezes, rales, or rhonchi Abdominal: Soft. Non-tender, non-distended, bowel sounds are normal, no masses, organomegaly, or guarding present.  Neurological: A&O x3 Skin: Warm, dry and intact. No rash, cyanosis, or clubbing.  Psychiatric: Normal mood and affect. speech and behavior is normal. Judgment and thought content normal. Cognition and memory are normal.   Assessment & Plan:  Case and care discussed with Dr. Kem Kays. Please see problem oriented charting for further details. Patient to return in 2 weeks for BP follow up and bmet.

## 2012-03-25 NOTE — Assessment & Plan Note (Signed)
Patient has been nonadherent to medications. She should take medication everyday at the same time daily for the next two weeks and then return for BP check & BMET.  She voices understanding.

## 2012-03-27 ENCOUNTER — Ambulatory Visit
Admission: RE | Admit: 2012-03-27 | Discharge: 2012-03-27 | Disposition: A | Discharge status: Home / Self Care | Payer: Medicare HMO | Source: Ambulatory Visit | Attending: Internal Medicine | Admitting: Internal Medicine

## 2012-03-27 DIAGNOSIS — N63 Unspecified lump in unspecified breast: Secondary | ICD-10-CM

## 2012-04-08 ENCOUNTER — Encounter: Payer: Self-pay | Admitting: Internal Medicine

## 2012-04-08 ENCOUNTER — Ambulatory Visit (INDEPENDENT_AMBULATORY_CARE_PROVIDER_SITE_OTHER): Payer: Medicare HMO | Admitting: Internal Medicine

## 2012-04-08 VITALS — BP 166/82 | HR 66 | Temp 98.3°F | Wt 233.7 lb

## 2012-04-08 DIAGNOSIS — I10 Essential (primary) hypertension: Secondary | ICD-10-CM

## 2012-04-08 LAB — BASIC METABOLIC PANEL
CO2: 32 mEq/L (ref 19–32)
Calcium: 9.3 mg/dL (ref 8.4–10.5)
Creat: 1.27 mg/dL — ABNORMAL HIGH (ref 0.50–1.10)
Glucose, Bld: 101 mg/dL — ABNORMAL HIGH (ref 70–99)

## 2012-04-08 NOTE — Patient Instructions (Signed)
-  Continue taking your medications every day as you have been.  Great job keeping up with the routine!  Please be sure to bring all of your medications with you to every visit.  Should you have any new or worsening symptoms, please be sure to call the clinic at [phone].

## 2012-04-08 NOTE — Progress Notes (Signed)
  Subjective:   Patient ID: [name] female   DOB: 04-Jun-1946 66 y.o.   MRN: [id number]  HPI: [name] is a 66 y.o. woman with h/o HTN, arthritis & GERD presents today for HTN follow up.  Continues to report dizziness with medications, but not for long.  No vision changes/ SOB/CP.  Reports fatigue/sleepiness with medications again today.   Past Medical History  Diagnosis Date  . GERD (gastroesophageal reflux disease)   . Arthritis   . Hypertension    Current Outpatient Prescriptions  Medication Sig Dispense Refill  . chlorthalidone (HYGROTON) 25 MG tablet Take 0.5 tablets (12.5 mg total) by mouth daily.  30 tablet  5  . metoprolol succinate (TOPROL XL) 25 MG 24 hr tablet Take 0.5 tablets (12.5 mg total) by mouth daily.  30 tablet  11   Family History  Problem Relation Age of Onset  . Heart disease Mother   . Cancer Brother    History   Social History  . Marital Status: Married    Spouse Name: N/A    Number of Children: N/A  . Years of Education: N/A   Social History Main Topics  . Smoking status: Former Smoker -- 2.0 packs/day    Types: Cigarettes    Start date: 06/17/1968  . Smokeless tobacco: Not on file   Comment: Quit in 1970s  . Alcohol Use: No     former drinker for approximately 10 years  . Drug Use: No  . Sexually Active: Yes    Birth Control/ Protection: Post-menopausal   Other Topics Concern  . Not on file   Social History Narrative  . No narrative on file   Review of Systems: General: no fevers, chills, changes in weight, changes in appetite  Skin: no rash  HEENT: no blurry vision, hearing changes, sore throat  Pulm: no dyspnea, coughing, wheezing  CV: no chest pain, palpitations, shortness of breath  Abd: no abdominal pain, nausea/vomiting, diarrhea/constipation  GU: no dysuria, hematuria, polyuria  Ext: no arthralgias, myalgias  Neuro: no weakness, numbness, or tingling   Objective:  Physical Exam: Filed Vitals:   04/08/12 1008 04/08/12 1025  BP: 192/95 166/82  Pulse: 66 66  Temp: 98.3 F (36.8 C)   TempSrc: Oral   Weight: 233 lb 11.2 oz (106.006 kg)     Constitutional: Vital signs reviewed. Patient is a well-developed and well-nourished woman in no acute distress and cooperative with exam.  Head: Normocephalic and atraumatic  Mouth: no erythema or exudates, MMM  Eyes: PERRL, EOMI, conjunctivae normal, No scleral icterus.  Neck: Supple, Trachea midline normal ROM, No JVD, mass, thyromegaly, or carotid bruit present.  Cardiovascular: RRR, S1 normal, S2 normal, +systolic murmur at left sternal border, pulses symmetric and intact bilaterally, 1+ b/l LE pitting edema  Pulmonary/Chest: CTAB, no wheezes, rales, or rhonchi  Abdominal: Soft. Non-tender, non-distended, bowel sounds are normal, no masses, organomegaly, or guarding present.  Neurological: A&O x3  Skin: Warm, dry and intact. No rash, cyanosis, or clubbing.  Psychiatric: Normal mood and affect. speech and behavior is normal. Judgment and thought content normal. Cognition and memory are normal.    Assessment & Plan:  Case and care discussed with Dr. Criselda Peaches. Please see problem oriented charting for further details. Patient to return in 1 month for BP follow up.

## 2012-04-08 NOTE — Assessment & Plan Note (Signed)
Patient has been adherent to medications.  Given complaints of dizziness/fatigue & patient has not taken meds today (daily at 11am), I will not change regimen at this time.  I will give her another month to adjust to daily medications and have her return, at which point we can consider increasing chlorthalidone to 25mg  daily.  BMET today.

## 2012-04-22 NOTE — Addendum Note (Signed)
Addended by: Bufford Spikes on: 04/22/2012 10:39 AM   Modules accepted: Orders

## 2012-05-08 ENCOUNTER — Ambulatory Visit: Payer: Medicare HMO | Admitting: Internal Medicine

## 2012-08-12 ENCOUNTER — Encounter: Payer: Self-pay | Admitting: Internal Medicine

## 2014-02-02 ENCOUNTER — Emergency Department (HOSPITAL_COMMUNITY): Payer: Medicare HMO

## 2014-02-02 ENCOUNTER — Inpatient Hospital Stay (HOSPITAL_COMMUNITY)
Admission: EM | Admit: 2014-02-02 | Discharge: 2014-02-08 | DRG: 065 | Disposition: A | Discharge status: Rehabilitation Facility | Payer: Medicare HMO | Attending: Neurology | Admitting: Neurology

## 2014-02-02 DIAGNOSIS — Z8249 Family history of ischemic heart disease and other diseases of the circulatory system: Secondary | ICD-10-CM

## 2014-02-02 DIAGNOSIS — I10 Essential (primary) hypertension: Secondary | ICD-10-CM

## 2014-02-02 DIAGNOSIS — I619 Nontraumatic intracerebral hemorrhage, unspecified: Principal | ICD-10-CM | POA: Diagnosis present

## 2014-02-02 DIAGNOSIS — E669 Obesity, unspecified: Secondary | ICD-10-CM | POA: Diagnosis present

## 2014-02-02 DIAGNOSIS — Z823 Family history of stroke: Secondary | ICD-10-CM

## 2014-02-02 DIAGNOSIS — R471 Dysarthria and anarthria: Secondary | ICD-10-CM | POA: Diagnosis present

## 2014-02-02 DIAGNOSIS — Z8673 Personal history of transient ischemic attack (TIA), and cerebral infarction without residual deficits: Secondary | ICD-10-CM | POA: Diagnosis present

## 2014-02-02 DIAGNOSIS — I159 Secondary hypertension, unspecified: Secondary | ICD-10-CM

## 2014-02-02 DIAGNOSIS — R29898 Other symptoms and signs involving the musculoskeletal system: Secondary | ICD-10-CM | POA: Diagnosis not present

## 2014-02-02 DIAGNOSIS — Z87891 Personal history of nicotine dependence: Secondary | ICD-10-CM

## 2014-02-02 DIAGNOSIS — R29810 Facial weakness: Secondary | ICD-10-CM | POA: Diagnosis present

## 2014-02-02 DIAGNOSIS — I629 Nontraumatic intracranial hemorrhage, unspecified: Secondary | ICD-10-CM | POA: Diagnosis present

## 2014-02-02 DIAGNOSIS — I610 Nontraumatic intracerebral hemorrhage in hemisphere, subcortical: Secondary | ICD-10-CM

## 2014-02-02 DIAGNOSIS — Z6838 Body mass index (BMI) 38.0-38.9, adult: Secondary | ICD-10-CM

## 2014-02-02 DIAGNOSIS — K219 Gastro-esophageal reflux disease without esophagitis: Secondary | ICD-10-CM | POA: Diagnosis present

## 2014-02-02 DIAGNOSIS — E785 Hyperlipidemia, unspecified: Secondary | ICD-10-CM | POA: Diagnosis present

## 2014-02-02 LAB — I-STAT CHEM 8, ED
BUN: 23 mg/dL (ref 6–23)
Calcium, Ion: 1.15 mmol/L (ref 1.13–1.30)
Chloride: 107 mEq/L (ref 96–112)
Creatinine, Ser: 1.5 mg/dL — ABNORMAL HIGH (ref 0.50–1.10)
Glucose, Bld: 116 mg/dL — ABNORMAL HIGH (ref 70–99)
HEMATOCRIT: 45 % (ref 36.0–46.0)
HEMOGLOBIN: 15.3 g/dL — AB (ref 12.0–15.0)
POTASSIUM: 3.7 meq/L (ref 3.7–5.3)
Sodium: 142 mEq/L (ref 137–147)
TCO2: 26 mmol/L (ref 0–100)

## 2014-02-02 LAB — CBC
HEMATOCRIT: 42.5 % (ref 36.0–46.0)
Hemoglobin: 13.8 g/dL (ref 12.0–15.0)
MCH: 27.9 pg (ref 26.0–34.0)
MCHC: 32.5 g/dL (ref 30.0–36.0)
MCV: 86 fL (ref 78.0–100.0)
PLATELETS: 273 10*3/uL (ref 150–400)
RBC: 4.94 MIL/uL (ref 3.87–5.11)
RDW: 13.6 % (ref 11.5–15.5)
WBC: 7.6 10*3/uL (ref 4.0–10.5)

## 2014-02-02 LAB — DIFFERENTIAL
Basophils Absolute: 0.1 10*3/uL (ref 0.0–0.1)
Basophils Relative: 1 % (ref 0–1)
EOS ABS: 0.1 10*3/uL (ref 0.0–0.7)
Eosinophils Relative: 1 % (ref 0–5)
Lymphocytes Relative: 24 % (ref 12–46)
Lymphs Abs: 1.8 10*3/uL (ref 0.7–4.0)
MONO ABS: 0.6 10*3/uL (ref 0.1–1.0)
Monocytes Relative: 7 % (ref 3–12)
NEUTROS ABS: 5.1 10*3/uL (ref 1.7–7.7)
Neutrophils Relative %: 67 % (ref 43–77)

## 2014-02-02 LAB — I-STAT TROPONIN, ED: Troponin i, poc: 0.02 ng/mL (ref 0.00–0.08)

## 2014-02-02 MED ORDER — LABETALOL HCL 5 MG/ML IV SOLN
INTRAVENOUS | Status: AC
  Administered 2014-02-02: 10 mg via INTRAVENOUS
  Filled 2014-02-02: qty 4

## 2014-02-02 MED ORDER — NICARDIPINE HCL IN NACL 20-0.86 MG/200ML-% IV SOLN
3.0000 mg/h | INTRAVENOUS | Status: DC
  Administered 2014-02-02: 5 mg/h via INTRAVENOUS
  Administered 2014-02-03 (×2): 15 mg/h via INTRAVENOUS
  Administered 2014-02-03 (×2): 10 mg/h via INTRAVENOUS
  Administered 2014-02-03: 12.5 mg/h via INTRAVENOUS
  Administered 2014-02-03: 5 mg/h via INTRAVENOUS
  Administered 2014-02-03: 15 mg/h via INTRAVENOUS
  Administered 2014-02-03: 7.5 mg/h via INTRAVENOUS
  Administered 2014-02-03: 10 mg/h via INTRAVENOUS
  Administered 2014-02-03: 15 mg/h via INTRAVENOUS
  Administered 2014-02-04 (×2): 5 mg/h via INTRAVENOUS
  Filled 2014-02-02 (×16): qty 200

## 2014-02-02 MED ORDER — LABETALOL HCL 5 MG/ML IV SOLN
10.0000 mg | Freq: Once | INTRAVENOUS | Status: AC
  Administered 2014-02-02: 10 mg via INTRAVENOUS

## 2014-02-02 NOTE — ED Notes (Signed)
MD at bedside. 

## 2014-02-02 NOTE — ED Provider Notes (Signed)
CSN: [id number]     Arrival date & time 02/02/14  2301 History   First MD Initiated Contact with Patient 02/02/14 2318     Chief Complaint  Patient presents with  . Code Stroke     (Consider location/radiation/quality/duration/timing/severity/associated sxs/prior Treatment) HPI  [name] is a 68 y.o. female who was seen at triage, at 23:14 with complaints of dysarthria and left-sided weakness. Store called based on his symptoms starting at 21:30. She is hypertensive. Will await CT results for consideration of thrombolysis.  Patient was at church tonight when she suddenly began to be awake. She is brought here by her husband by private vehicle for evaluation.   Level 5 caveat- acute serious illness   Past Medical History  Diagnosis Date  . GERD (gastroesophageal reflux disease)   . Arthritis   . Hypertension    No past surgical history on file. Family History  Problem Relation Age of Onset  . Heart disease Mother   . Cancer Brother    History  Substance Use Topics  . Smoking status: Former Smoker -- 2.00 packs/day    Types: Cigarettes    Start date: 06/17/1968  . Smokeless tobacco: Not on file     Comment: Quit in 1970s  . Alcohol Use: No     Comment: former drinker for approximately 10 years   OB History   Grav Para Term Preterm Abortions TAB SAB Ect Mult Living                 Review of Systems  Unable to perform ROS     Allergies  Review of patient's allergies indicates no known allergies.  Home Medications   Prior to Admission medications   Medication Sig Start Date End Date Taking? Authorizing Provider  chlorthalidone (HYGROTON) 25 MG tablet Take 0.5 tablets (12.5 mg total) by mouth daily. 03/04/12   Wilber Oliphant, MD  metoprolol succinate (TOPROL XL) 25 MG 24 hr tablet Take 0.5 tablets (12.5 mg total) by mouth daily. 03/04/12 03/04/13  Wilber Oliphant, MD   BP 192/104  Pulse 88  Temp(Src) 97.8 F (36.6 C) (Oral)  Resp 18  SpO2  95% Physical Exam  Nursing note and vitals reviewed. Constitutional: She is oriented to person, place, and time. She appears well-developed.  Overweight  HENT:  Head: Normocephalic and atraumatic.  Eyes: Conjunctivae and EOM are normal. Pupils are equal, round, and reactive to light.  Neck: Normal range of motion and phonation normal. Neck supple.  Cardiovascular: Normal rate, regular rhythm and intact distal pulses.   Pulmonary/Chest: Effort normal and breath sounds normal. She exhibits no tenderness.  Abdominal: Soft. She exhibits no distension. There is no tenderness. There is no guarding.  Musculoskeletal: Normal range of motion.  Neurological: She is alert and oriented to person, place, and time. She exhibits normal muscle tone.  Dysarthria present. Left arm drift. Mild weakness of face, and left arm.  Skin: Skin is warm and dry.  Psychiatric: She has a normal mood and affect. Her behavior is normal. Judgment and thought content normal.    ED Course  Procedures (including critical care time)  23:33- Dr. Leonel Ramsay is assuming care for acute right BG bleed. Thrombolytics not given d/t intracranial bleeding.  Medications - No data to display  Patient Vitals for the past 24 hrs:  BP Temp Temp src Pulse Resp SpO2  02/02/14 2313 192/104 mmHg 97.8 F (36.6 C) Oral 88 18 95 %    11:40 PM Reevaluation  with update and discussion. After initial assessment and treatment, an updated evaluation reveals PE unchanged. [place]  APTT  CBC  DIFFERENTIAL  COMPREHENSIVE METABOLIC PANEL  CBG MONITORING, ED  I-STAT CHEM 8, ED  I-STAT TROPOININ, ED    Imaging Review No results found.   EKG Interpretation   Date/Time:  Wednesday February 02 2014 23:58:46 EDT Ventricular Rate:  83 PR Interval:  167 QRS Duration: 94 QT Interval:  423 QTC Calculation: 497 R Axis:   -12 Text Interpretation:  Sinus rhythm Borderline T wave  abnormalities  Borderline prolonged QT interval since last tracing no significant change  Confirmed by Eulis Foster  MD, Joshuan Bolander ([id number]) on 02/03/2014 12:15:35 AM      MDM   Final diagnoses:  Intracranial bleed  Secondary hypertension, unspecified    Patient will needs admission for observation and treatment , and stabilization  Nursing Notes Reviewed/ Care Coordinated, and agree without changes. Applicable Imaging Reviewed.  Interpretation of Laboratory Data incorporated into ED treatment  Admission to the stroke team.    Richarda Blade, MD 02/03/14 Laureen Abrahams

## 2014-02-02 NOTE — ED Notes (Addendum)
2130: at church. Started shout and became shake. Had to sit down. Pt. States, "haven't eat and drink." family said, "mouth twisted to left." no dysarthia. Rt. Tongue deviation. Mild slurred speech. Left sided weakness. Really bad headache.

## 2014-02-02 NOTE — Code Documentation (Signed)
68 yo bf brought in via pvt vehicle to triage for HA & Lt weakness.  Per pt she was at church praising the [place] when she developed Lt side weakness.  On assessment pt has Lt side weakness, facial droop, & slow speech, slight sensory changes.  NIH 8.

## 2014-02-03 ENCOUNTER — Encounter (HOSPITAL_COMMUNITY): Payer: Self-pay

## 2014-02-03 ENCOUNTER — Inpatient Hospital Stay (HOSPITAL_COMMUNITY): Payer: Medicare HMO

## 2014-02-03 DIAGNOSIS — I619 Nontraumatic intracerebral hemorrhage, unspecified: Secondary | ICD-10-CM | POA: Diagnosis present

## 2014-02-03 DIAGNOSIS — Z6838 Body mass index (BMI) 38.0-38.9, adult: Secondary | ICD-10-CM | POA: Diagnosis not present

## 2014-02-03 DIAGNOSIS — R471 Dysarthria and anarthria: Secondary | ICD-10-CM | POA: Diagnosis present

## 2014-02-03 DIAGNOSIS — E785 Hyperlipidemia, unspecified: Secondary | ICD-10-CM | POA: Diagnosis present

## 2014-02-03 DIAGNOSIS — Z8673 Personal history of transient ischemic attack (TIA), and cerebral infarction without residual deficits: Secondary | ICD-10-CM | POA: Diagnosis present

## 2014-02-03 DIAGNOSIS — I6350 Cerebral infarction due to unspecified occlusion or stenosis of unspecified cerebral artery: Secondary | ICD-10-CM

## 2014-02-03 DIAGNOSIS — I10 Essential (primary) hypertension: Secondary | ICD-10-CM | POA: Diagnosis present

## 2014-02-03 DIAGNOSIS — E669 Obesity, unspecified: Secondary | ICD-10-CM | POA: Diagnosis present

## 2014-02-03 DIAGNOSIS — Z823 Family history of stroke: Secondary | ICD-10-CM | POA: Diagnosis not present

## 2014-02-03 DIAGNOSIS — I629 Nontraumatic intracranial hemorrhage, unspecified: Secondary | ICD-10-CM | POA: Diagnosis present

## 2014-02-03 DIAGNOSIS — Z87891 Personal history of nicotine dependence: Secondary | ICD-10-CM | POA: Diagnosis not present

## 2014-02-03 DIAGNOSIS — K219 Gastro-esophageal reflux disease without esophagitis: Secondary | ICD-10-CM | POA: Diagnosis present

## 2014-02-03 DIAGNOSIS — R29898 Other symptoms and signs involving the musculoskeletal system: Secondary | ICD-10-CM | POA: Diagnosis present

## 2014-02-03 DIAGNOSIS — I6789 Other cerebrovascular disease: Secondary | ICD-10-CM | POA: Diagnosis not present

## 2014-02-03 DIAGNOSIS — R29810 Facial weakness: Secondary | ICD-10-CM | POA: Diagnosis present

## 2014-02-03 DIAGNOSIS — Z8249 Family history of ischemic heart disease and other diseases of the circulatory system: Secondary | ICD-10-CM | POA: Diagnosis not present

## 2014-02-03 DIAGNOSIS — Z5189 Encounter for other specified aftercare: Secondary | ICD-10-CM | POA: Diagnosis not present

## 2014-02-03 LAB — COMPREHENSIVE METABOLIC PANEL
ALK PHOS: 73 U/L (ref 39–117)
ALT: 10 U/L (ref 0–35)
AST: 14 U/L (ref 0–37)
Albumin: 4.3 g/dL (ref 3.5–5.2)
Anion gap: 13 (ref 5–15)
BUN: 22 mg/dL (ref 6–23)
CHLORIDE: 101 meq/L (ref 96–112)
CO2: 25 mEq/L (ref 19–32)
Calcium: 9.8 mg/dL (ref 8.4–10.5)
Creatinine, Ser: 1.38 mg/dL — ABNORMAL HIGH (ref 0.50–1.10)
GFR calc non Af Amer: 39 mL/min — ABNORMAL LOW (ref >90)
GFR, EST AFRICAN AMERICAN: 45 mL/min — AB (ref >90)
Glucose, Bld: 111 mg/dL — ABNORMAL HIGH (ref 70–99)
POTASSIUM: 3.8 meq/L (ref 3.7–5.3)
Sodium: 139 mEq/L (ref 137–147)
TOTAL PROTEIN: 8.7 g/dL — AB (ref 6.0–8.3)
Total Bilirubin: 0.5 mg/dL (ref 0.3–1.2)

## 2014-02-03 LAB — HEMOGLOBIN A1C
Hgb A1c MFr Bld: 5.8 % — ABNORMAL HIGH (ref <5.7)
Mean Plasma Glucose: 120 mg/dL — ABNORMAL HIGH (ref <117)

## 2014-02-03 LAB — PROTIME-INR
INR: 1.09 (ref 0.00–1.49)
PROTHROMBIN TIME: 14.1 s (ref 11.6–15.2)

## 2014-02-03 LAB — CBC
HCT: 38.5 % (ref 36.0–46.0)
Hemoglobin: 12.3 g/dL (ref 12.0–15.0)
MCH: 27.8 pg (ref 26.0–34.0)
MCHC: 31.9 g/dL (ref 30.0–36.0)
MCV: 86.9 fL (ref 78.0–100.0)
Platelets: 251 10*3/uL (ref 150–400)
RBC: 4.43 MIL/uL (ref 3.87–5.11)
RDW: 13.8 % (ref 11.5–15.5)
WBC: 7.6 10*3/uL (ref 4.0–10.5)

## 2014-02-03 LAB — CBG MONITORING, ED: GLUCOSE-CAPILLARY: 113 mg/dL — AB (ref 70–99)

## 2014-02-03 LAB — LIPID PANEL
Cholesterol: 195 mg/dL (ref 0–200)
HDL: 42 mg/dL (ref >39)
LDL CALC: 136 mg/dL — AB (ref 0–99)
Total CHOL/HDL Ratio: 4.6 RATIO
Triglycerides: 87 mg/dL (ref <150)
VLDL: 17 mg/dL (ref 0–40)

## 2014-02-03 LAB — BASIC METABOLIC PANEL
ANION GAP: 16 — AB (ref 5–15)
BUN: 18 mg/dL (ref 6–23)
CALCIUM: 8.9 mg/dL (ref 8.4–10.5)
CO2: 21 mEq/L (ref 19–32)
Chloride: 106 mEq/L (ref 96–112)
Creatinine, Ser: 1.19 mg/dL — ABNORMAL HIGH (ref 0.50–1.10)
GFR, EST AFRICAN AMERICAN: 54 mL/min — AB (ref >90)
GFR, EST NON AFRICAN AMERICAN: 46 mL/min — AB (ref >90)
Glucose, Bld: 151 mg/dL — ABNORMAL HIGH (ref 70–99)
Potassium: 3.8 mEq/L (ref 3.7–5.3)
Sodium: 143 mEq/L (ref 137–147)

## 2014-02-03 LAB — APTT: APTT: 30 s (ref 24–37)

## 2014-02-03 LAB — TSH: TSH: 0.923 u[IU]/mL (ref 0.350–4.500)

## 2014-02-03 LAB — MRSA PCR SCREENING: MRSA BY PCR: NEGATIVE

## 2014-02-03 MED ORDER — LABETALOL HCL 5 MG/ML IV SOLN: 10.0000 mg | INTRAVENOUS | Status: DC | PRN

## 2014-02-03 MED ORDER — ACETAMINOPHEN 650 MG RE SUPP
650.0000 mg | RECTAL | Status: DC | PRN
Start: 2014-02-03 — End: 2014-02-04

## 2014-02-03 MED ORDER — STROKE: EARLY STAGES OF RECOVERY BOOK
Freq: Once | Status: DC
  Filled 2014-02-03 (×2): qty 1

## 2014-02-03 MED ORDER — SODIUM CHLORIDE 0.9 % IV SOLN
INTRAVENOUS | Status: DC
  Administered 2014-02-03: 50 mL via INTRAVENOUS

## 2014-02-03 MED ORDER — SENNOSIDES-DOCUSATE SODIUM 8.6-50 MG PO TABS
1.0000 | ORAL_TABLET | Freq: Two times a day (BID) | ORAL | Status: DC
  Filled 2014-02-03: qty 1

## 2014-02-03 MED ORDER — METOPROLOL TARTRATE 25 MG PO TABS
25.0000 mg | ORAL_TABLET | Freq: Two times a day (BID) | ORAL | Status: DC
  Administered 2014-02-03 (×2): 25 mg via ORAL
  Filled 2014-02-03 (×4): qty 1

## 2014-02-03 MED ORDER — ACETAMINOPHEN 325 MG PO TABS
650.0000 mg | ORAL_TABLET | ORAL | Status: DC | PRN
  Administered 2014-02-03 – 2014-02-08 (×9): 650 mg via ORAL
  Filled 2014-02-03 (×9): qty 2

## 2014-02-03 MED ORDER — AMLODIPINE BESYLATE 10 MG PO TABS
10.0000 mg | ORAL_TABLET | Freq: Every day | ORAL | Status: DC
  Administered 2014-02-03 – 2014-02-08 (×6): 10 mg via ORAL
  Filled 2014-02-03 (×6): qty 1

## 2014-02-03 MED ORDER — SENNOSIDES-DOCUSATE SODIUM 8.6-50 MG PO TABS
1.0000 | ORAL_TABLET | Freq: Two times a day (BID) | ORAL | Status: DC
  Administered 2014-02-03 – 2014-02-08 (×9): 1 via ORAL
  Filled 2014-02-03 (×12): qty 1

## 2014-02-03 MED ORDER — PANTOPRAZOLE SODIUM 40 MG IV SOLR
40.0000 mg | Freq: Every day | INTRAVENOUS | Status: DC
  Administered 2014-02-03 – 2014-02-04 (×2): 40 mg via INTRAVENOUS
  Filled 2014-02-03 (×3): qty 40

## 2014-02-03 NOTE — ED Notes (Signed)
The pt  Reports that she does not take any medicines for anything.  She knows she has high bp  But she is afraid of meds

## 2014-02-03 NOTE — Progress Notes (Signed)
  Echocardiogram 2D Echocardiogram has been performed.  Mauricio Po 02/03/2014, 3:22 PM

## 2014-02-03 NOTE — Progress Notes (Signed)
*  PRELIMINARY RESULTS* Vascular Ultrasound Carotid Duplex (Doppler) has been completed. Findings suggest 1-39% internal carotid artery stenosis bilaterally. Vertebral arteries are patent with antegrade flow.   02/03/2014  Estella Husk, RVT

## 2014-02-03 NOTE — H&P (Signed)
Neurology H&P  CC: Left-sided weakness  History is obtained from: Patient, family  HPI: [name] is a 68 y.o. female with a history of hypertension who is not currently taking any treatment who presents with sudden onset left-sided weakness while in church earlier today. She also complains of headache.  She is been told that she has hypertension the past, but did not like taking medications and therefore has not been under treatment for it for the past 2 years.  LKW: 9:30 PM tpa given?: no, hemorrhage    ROS: A 14 point ROS was performed and is negative except as noted in the HPI.   Past Medical History  Diagnosis Date  . GERD (gastroesophageal reflux disease)   . Arthritis   . Hypertension     Family History: Brother-intracranial hemorrhage  Social History: Tob: Quit 1970s  Exam: Current vital signs: BP 154/71  Pulse 82  Temp(Src) 97.8 F (36.6 C) (Oral)  Resp 20  SpO2 94% Vital signs in last 24 hours: Temp:  [97.8 F (36.6 C)] 97.8 F (36.6 C) (08/19 2313) Pulse Rate:  [77-89] 82 (08/20 0024) Resp:  [18-27] 20 (08/20 0024) BP: (137-218)/(70-107) 154/71 mmHg (08/20 0024) SpO2:  [94 %-98 %] 94 % (08/20 0024)  General: In bed, NAD HEENT: No signs of mucositis or other mouth lesions CV: Regular rate and rhythm Resp: Clear to auscultation Abdomen: Nontender, nondistended Extremities: Trace edema Mental Status: Patient is awake, alert, oriented to person, place, month, year, and situation. Immediate and remote memory are intact. Patient is able to give a clear and coherent history. No signs of aphasia or neglect Cranial Nerves: II: Visual Fields are full. Pupils are equal, round, and reactive to light.  Discs are difficult to visualize. III,IV, VI: EOMI without ptosis or diploplia.  V: Facial sensation is symmetric to pin VII: Facial movement is notable for left facial weakness VIII: hearing is intact to voice X: Uvula elevates symmetrically XI:  Shoulder shrug is symmetric. XII: tongue is midline without atrophy or fasciculations.  Motor: Tone is normal. Bulk is normal. 5/5 strength was present on the right side, she has 2/5 strength of the left arm and leg Sensory: Sensation is mildly diminished to pin in the left arm Deep Tendon Reflexes: 2+ and symmetric in the biceps and patellae.  Cerebellar: FNF and HKS are intact on right, unable to perform on left Gait: Not tested due to weakness   I have reviewed labs in epic and the results pertinent to this consultation are: Mildly elevated creatinine, appears consistent with previous measurements  I have reviewed the images obtained: CT head-right basal ganglia hemorrhage with IVH  Impression: 68 year old female with a history of hypertension that is untreated who presents with right basal ganglia hemorrhage. She will be admitted to the intensive care unit for aggressive blood pressure control.  Recommendations: 1) Admit to ICU 2) no antiplatelets or anticoagulants 3) blood pressure control with goal systolic <062 4) Frequent neuro checks 5) If symptoms worsen or there is decreased mental status, repeat stat head CT 6) PT,OT,ST   This patient is critically ill and at significant risk of neurological worsening, death and care requires constant monitoring of vital signs, hemodynamics,respiratory and cardiac monitoring, neurological assessment, discussion with family, other specialists and medical decision making of high complexity. I spent 60 minutes of neurocritical care time  in the care of  this patient.  Roland Rack, MD [facility] [phone]  If 7pm- 7am, please page neurology on call  as listed in College. 02/03/2014  12:35 AM

## 2014-02-03 NOTE — Progress Notes (Signed)
STROKE TEAM PROGRESS NOTE   HISTORY [name] is a 68 y.o. female with a history of hypertension who is not currently taking any treatment who presents with sudden onset left-sided weakness while in church earlier today 02/02/2014 at 0930 pm.. She also complains of headache. She is been told that she has hypertension the past, but did not like taking medications and therefore has not been under treatment for it for the past 2 years. Patient was not administered TPA secondary to hemorrhage. She was admitted to the neuro ICU for further evaluation and treatment.   SUBJECTIVE (INTERVAL HISTORY) Her granddaughter is at the bedside.  Overall she feels her condition is stable. She stated that she was not compliant with medication and she did not like any medication. But now she knows she had a brain bleeding and she will take care of her BP and take medications at home. She complains mild headache this am. No N/V.  OBJECTIVE  Ct Head Wo Contrast 02/03/2014 9:35am  IMPRESSION: Right thalamic hemorrhage with ventricular extension unchanged from yesterday.     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 This also blood in the right lateral ventricle.      BMET    Component Value Date/Time   NA 142 02/02/2014 2349   K 3.7 02/02/2014 2349   CL 107 02/02/2014 2349   CO2 25 02/02/2014 2340   GLUCOSE 116* 02/02/2014 2349   BUN 23 02/02/2014 2349   CREATININE 1.50* 02/02/2014 2349   CREATININE 1.27* 04/08/2012 1109   CALCIUM 9.8 02/02/2014 2340   GFRNONAA 39* 02/02/2014 2340   GFRNONAA 50* 03/04/2012 1644   GFRAA 45* 02/02/2014 2340   GFRAA 58* 03/04/2012 1644   CBC    Component Value Date/Time   WBC 7.6 02/02/2014 2340   RBC 4.94 02/02/2014 2340   HGB 15.3* 02/02/2014 2349   HCT 45.0 02/02/2014 2349   PLT 273 02/02/2014 2340   MCV 86.0 02/02/2014 2340   MCH 27.9 02/02/2014 2340   MCHC 32.5 02/02/2014 2340   RDW 13.6 02/02/2014 2340   LYMPHSABS 1.8  02/02/2014 2340   MONOABS 0.6 02/02/2014 2340   EOSABS 0.1 02/02/2014 2340   BASOSABS 0.1 02/02/2014 2340    PHYSICAL EXAM  Temp:  [97.8 F (36.6 C)-99.5 F (37.5 C)] 99.5 F (37.5 C) (08/20 1116) Pulse Rate:  [77-89] 87 (08/20 1115) Resp:  [13-30] 26 (08/20 1115) BP: (135-218)/(59-107) 140/60 mmHg (08/20 1115) SpO2:  [92 %-99 %] 97 % (08/20 1115) Weight:  [224 lb 3.3 oz (101.7 kg)] 224 lb 3.3 oz (101.7 kg) (08/20 0100)  General - Well nourished, well developed, in no apparent distress.  Ophthalmologic - not able to see through.  Cardiovascular - Regular rate and rhythm with no murmur.  Mental Status -  Level of arousal and orientation to time, place, and person were intact. Language including expression, naming, repetition, comprehension was assessed and found intact. Fund of Knowledge was assessed and was intact.  Cranial Nerves II - XII - II - Visual field intact OU. III, IV, VI - Extraocular movements intact. V - Facial sensation intact bilaterally. VII - Facial movement intact bilaterally. VIII - Hearing & vestibular intact bilaterally. X - Palate elevates symmetrically. XI - Chin turning & shoulder shrug intact bilaterally. XII - Tongue protrusion intact.  Motor Strength - The patient's strength was 4-/5 LUE proximal, 5/5 distal, 4/5 LLE proximal and 4+/5 distally. positive pronator drift on the left.  Bulk was normal and fasciculations were absent.   Motor Tone - Muscle tone was assessed at the neck and appendages and was normal.  Reflexes - The patient's reflexes were normal in all extremities and she had no pathological reflexes.  Sensory - Light touch, temperature/pinprick were assessed and were normal.    Coordination - The patient had normal movements in the hands and feet with no ataxia or dysmetria.  Tremor was absent.  Gait and Station - not tested due to BP concern.   ASSESSMENT/PLAN  [name] is a 68 y.o. female with hx of hypertension  not taking medications presenting with left sided weakness. 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 Volume: 15 cc. Placed on cardene drip. Stroke work up underway. Repeat CT in am showed stable hematoma and ventricular extension.  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  Check 2D Echo     Check Carotid   SCDs for VTE prophylaxis. Consider heparin subq tomorrow  Clear Liquid thin liquids, waiting for swallow eval.   Therapy needs:  evals pending for Friday  Risk factor management/education  Patient counseled to be compliant with his antithrombotic medications  Malignant Hypertension   BP 218/107 on admission  Placed on cardene drip  Home meds:  Metoprolol 12.5, hygroton 12.5.   Will start on metoprolol and amlodipine due to elevated Cre  BP 135-218/66-107 past 24h  SBP goal < 140  Unstable, try to wean off cardene  Patient counseled to be compliant with his blood pressure medications  Hyperlipidemia Hx  Check lipids in am (alone with HgbA1c, TSH)  Other Stroke Risk Factors   Obesity, Body mass index is 38.47 kg/(m^2).    Family hx stroke (brother with Doyle)  Hospital day # 1  This patient is critically ill due to left BG bleeding and difficulty BP control and at significant risk of neurological worsening, death form hematoma expansion, hydrocephalus, brain edema and cerebral herniation. This patient's care requires constant monitoring of vital signs, hemodynamics, respiratory and cardiac monitoring, review of multiple databases, neurological assessment, discussion with family, other specialists and medical decision making of high complexity. I spent 40 minutes of neurocritical care time in the care of this  patient.  Rosalin Hawking, MD PhD Stroke Neurology 02/03/2014 12:52 PM  To contact Stroke Continuity provider, please refer to [email or web address] After hours, contact General Neurology

## 2014-02-03 NOTE — ED Notes (Signed)
Pt has returned from [place], neurologist at bedside. RR RN at bedside. Family being updated by Dr. Leonel Ramsay

## 2014-02-03 NOTE — ED Notes (Signed)
The pt passed her swallow screen

## 2014-02-03 NOTE — ED Notes (Signed)
Husband and minister at the bedside

## 2014-02-03 NOTE — ED Notes (Signed)
Pt c/o a headache.  She reports that it is not as bad as it was.

## 2014-02-03 NOTE — Progress Notes (Addendum)
Pt had worsening mental status. She was up and wake whole morning. Had multiple family member visit in the morning. Pt fully awake alert and conversant at that time. This afternoon, she became more lethargic, sleepy, drowsy. Came to bedside for evaluation, she was drowsy, but able to open eyes on command, but fell back to sleep without stimulation. She was still orientated to place time and people but seems worsening left arm and leg strength. PERRL, EOMI, denies any HA, N/V. Will repeat stat CT again to rule out any hematoma expansion or hydrocephalus. BP was controlled well with cardene, < 140. Had po meds amlodipine and metoprolol given.   Rosalin Hawking, MD PhD Stroke Neurology 02/03/2014 4:22 PM   ADDENDUM Pt condition much improved after her daughter and grandaugter are in room. She bascially woke up and conversed normally. Back to her previous state. Will d/c stat CT. Continue to monitor BP and mental status.

## 2014-02-04 DIAGNOSIS — I619 Nontraumatic intracerebral hemorrhage, unspecified: Secondary | ICD-10-CM

## 2014-02-04 DIAGNOSIS — I10 Essential (primary) hypertension: Secondary | ICD-10-CM

## 2014-02-04 LAB — CBC
HCT: 38.2 % (ref 36.0–46.0)
HEMOGLOBIN: 12.3 g/dL (ref 12.0–15.0)
MCH: 27.8 pg (ref 26.0–34.0)
MCHC: 32.2 g/dL (ref 30.0–36.0)
MCV: 86.4 fL (ref 78.0–100.0)
Platelets: 241 10*3/uL (ref 150–400)
RBC: 4.42 MIL/uL (ref 3.87–5.11)
RDW: 14 % (ref 11.5–15.5)
WBC: 10.1 10*3/uL (ref 4.0–10.5)

## 2014-02-04 LAB — BASIC METABOLIC PANEL
ANION GAP: 12 (ref 5–15)
BUN: 16 mg/dL (ref 6–23)
CHLORIDE: 105 meq/L (ref 96–112)
CO2: 23 mEq/L (ref 19–32)
Calcium: 8.9 mg/dL (ref 8.4–10.5)
Creatinine, Ser: 1.21 mg/dL — ABNORMAL HIGH (ref 0.50–1.10)
GFR calc non Af Amer: 45 mL/min — ABNORMAL LOW (ref >90)
GFR, EST AFRICAN AMERICAN: 52 mL/min — AB (ref >90)
Glucose, Bld: 122 mg/dL — ABNORMAL HIGH (ref 70–99)
Potassium: 3.9 mEq/L (ref 3.7–5.3)
SODIUM: 140 meq/L (ref 137–147)

## 2014-02-04 MED ORDER — HEPARIN SODIUM (PORCINE) 5000 UNIT/ML IJ SOLN
5000.0000 [IU] | Freq: Three times a day (TID) | INTRAMUSCULAR | Status: DC
  Administered 2014-02-04 – 2014-02-08 (×10): 5000 [IU] via SUBCUTANEOUS
  Filled 2014-02-04 (×14): qty 1

## 2014-02-04 MED ORDER — METOPROLOL TARTRATE 50 MG PO TABS
50.0000 mg | ORAL_TABLET | Freq: Two times a day (BID) | ORAL | Status: DC
Start: 2014-02-04 — End: 2014-02-05
  Administered 2014-02-04 (×2): 50 mg via ORAL
  Filled 2014-02-04 (×4): qty 1

## 2014-02-04 MED ORDER — PANTOPRAZOLE SODIUM 40 MG PO TBEC
40.0000 mg | DELAYED_RELEASE_TABLET | Freq: Every day | ORAL | Status: DC
  Administered 2014-02-05 – 2014-02-08 (×4): 40 mg via ORAL
  Filled 2014-02-04 (×4): qty 1

## 2014-02-04 MED ORDER — LABETALOL HCL 5 MG/ML IV SOLN
10.0000 mg | INTRAVENOUS | Status: DC | PRN
  Administered 2014-02-05: 10 mg via INTRAVENOUS
  Filled 2014-02-04: qty 4

## 2014-02-04 NOTE — Evaluation (Signed)
Physical Therapy Evaluation Patient Details Name: [name] MRN: [id number] DOB: June 19, 1945 Today's Date: 02/04/2014   History of Present Illness  [name] is a 68 y.o. female with hx of hypertension not taking medications presenting with left sided weakness. 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  Clinical Impression  Patient demonstrates deficits in functional mobility as indicated below. Will need continued skilled PT to address deficits and maximize function. At this time, recommend CIR upon acute discharge to facilitate safety with mobility and recovery of function.    Follow Up Recommendations CIR;Supervision/Assistance - 24 hour    Equipment Recommendations  Other (comment) (TBD)    Recommendations for Other Services Rehab consult;OT consult     Precautions / Restrictions Precautions Precautions: Fall      Mobility  Bed Mobility Overal bed mobility: Needs Assistance Bed Mobility: Supine to Sit     Supine to sit: Mod assist;+2 for physical assistance     General bed mobility comments: Assist for initiation of movement, cues for hand placement and assit to faciliate trunk power up to upright position and provide stability.  Transfers Overall transfer level: Needs assistance Equipment used: 2 person hand held assist (face to face with gait belt) Transfers: Sit to/from [employer] Transfers Sit to Stand: Mod assist;+2 physical assistance Stand pivot transfers: Mod assist;+2 physical assistance       General transfer comment: performed SPT x2 with cues for safety and hand placement, max cues for pivotal steps cues to attend to LLE and initiate steps  Ambulation/Gait                Stairs            Wheelchair Mobility    Modified Rankin (Stroke Patients Only) Modified Rankin (Stroke Patients Only) Pre-Morbid Rankin Score: No symptoms Modified Rankin: Moderately severe  disability     Balance Overall balance assessment: Needs assistance Sitting-balance support: Feet supported Sitting balance-Leahy Scale: Poor Sitting balance - Comments: posterior support provided initially Postural control: Posterior lean Standing balance support: Bilateral upper extremity supported;During functional activity Standing balance-Leahy Scale: Poor Standing balance comment: difficulty attending to movement with LLE, able to perform with increased time and cues to attend                             Pertinent Vitals/Pain Pain Assessment: No/denies pain    Home Living Family/patient expects to be discharged to:: Private residence Living Arrangements: Spouse/significant other Available Help at Discharge: Family Type of Home: House Home Access: Level entry (threshold step)     Home Layout: One level Home Equipment: None      Prior Function Level of Independence: Independent               Hand Dominance   Dominant Hand: Right    Extremity/Trunk Assessment   Upper Extremity Assessment: Defer to OT evaluation           Lower Extremity Assessment: Generalized weakness;LLE deficits/detail;Difficult to assess due to impaired cognition   LLE Deficits / Details: asymetrical weakness LLE, inattention  Cervical / Trunk Assessment:  (increased body habitus)  Communication      Cognition Arousal/Alertness: Lethargic Behavior During Therapy: Flat affect Overall Cognitive Status: Impaired/Different from baseline Area of Impairment: Attention;Following commands;Safety/judgement;Awareness;Problem solving   Current Attention Level: Focused   Following Commands: Follows one step commands consistently;Follows one step commands with increased time Safety/Judgement: Decreased awareness of  deficits Awareness: Intellectual Problem Solving: Slow processing;Decreased initiation;Difficulty sequencing;Requires verbal cues;Requires tactile cues General  Comments: patient with inattention to Left side, cues for tasks, patient able to carry out function with LLE when cued    General Comments      Exercises        Assessment/Plan    PT Assessment Patient needs continued PT services  PT Diagnosis Difficulty walking;Abnormality of gait   PT Problem List Decreased strength;Decreased activity tolerance;Decreased balance;Decreased mobility;Decreased safety awareness;Obesity  PT Treatment Interventions DME instruction;Gait training;Functional mobility training;Therapeutic activities;Therapeutic exercise;Balance training;Patient/family education   PT Goals (Current goals can be found in the Care Plan section) Acute Rehab PT Goals Patient Stated Goal: to be able to walk again PT Goal Formulation: With patient Time For Goal Achievement: 02/18/14 Potential to Achieve Goals: Fair    Frequency Min 3X/week   Barriers to discharge        Co-evaluation               End of Session Equipment Utilized During Treatment: Gait belt;Oxygen Activity Tolerance: Patient tolerated treatment well;Patient limited by fatigue;Patient limited by lethargy Patient left: in chair;with call bell/phone within reach Nurse Communication: Mobility status         Time: 5035-4656 PT Time Calculation (min): 20 min   Charges:   PT Evaluation $Initial PT Evaluation Tier I: 1 Procedure PT Treatments $Therapeutic Activity: 8-22 mins   PT G CodesDuncan Dull 02/04/2014, 2:23 PM Alben Deeds, [place] DPT  [phone]

## 2014-02-04 NOTE — Evaluation (Signed)
Speech Language Pathology Evaluation Patient Details Name: GISSELLA NIBLACK MRN: [id number] DOB: June 12, 1946 Today's Date: 02/04/2014 Time: 1157-1207 SLP Time Calculation (min): 10 min  Problem List:  Patient Active Problem List   Diagnosis Date Noted  . ICH (intracerebral hemorrhage) 02/03/2014  . Intracranial bleed 02/03/2014  . Shortness of breath 03/04/2012  . Bilateral lower extremity edema 03/04/2012  . Knee pain, bilateral 03/04/2012  . Cardiomegaly 03/04/2012  . Obesity (BMI 30-39.9) 03/04/2012  . Fatigue 03/04/2012  . Chronic kidney disease unknown stage 03/04/2012  . Dizziness 03/04/2012  . Sleep disturbance 03/04/2012  . Hemorrhoids 07/03/2006  . TOBACCO ABUSE, HX OF 07/03/2006  . HYPERTENSION 04/30/2006  . GERD 04/30/2006  . LOW BACK PAIN 04/30/2006  . Hx of alcohol use 04/30/2006   Past Medical History:  Past Medical History  Diagnosis Date  . GERD (gastroesophageal reflux disease)   . Arthritis   . Hypertension    Past Surgical History: History reviewed. No pertinent past surgical history. HPI:  68 year old female with a history of hypertension that is untreated who presents with right thalamic hemorrhage with ventricular extension    Assessment / Plan / Recommendation Clinical Impression  Pt participated in limited cognitive-linguistic evaluation due to lethargy, requiring max verbal/tactile prompting to improve alertness and attention to assessment tasks.  When alert, maintaining closed eyes, pt answered basic orientation and biographic questions.  Recommend continued SLP f/u as MS improves to address overall cognition.     SLP Assessment  Patient needs continued Speech Lanaguage Pathology Services    Follow Up Recommendations    TBA   Frequency and Duration min 3x week  2 weeks        SLP Goals  SLP Goals Potential to Achieve Goals: Fair  SLP Evaluation Prior Functioning  Type of Home: House  Lives With: Spouse Vocation: Retired (worked  at [employer] for 14 years)   Cognition  Overall Cognitive Status: Impaired/Different from baseline Arousal/Alertness: Lethargic Orientation Level: Oriented to person;Oriented to place;Oriented to situation Attention: Focused Focused Attention: Impaired Focused Attention Impairment: Verbal basic Memory: Impaired Memory Impairment: Retrieval deficit Awareness: Impaired Awareness Impairment: Intellectual impairment Problem Solving: Impaired Problem Solving Impairment: Verbal basic;Functional basic Safety/Judgment: Impaired    Comprehension  Auditory Comprehension Overall Auditory Comprehension: Appears within functional limits for tasks assessed    Expression Expression Primary Mode of Expression: Verbal Verbal Expression Overall Verbal Expression:  (TBA)   Oral / Motor Oral Motor/Sensory Function Overall Oral Motor/Sensory Function: Appears within functional limits for tasks assessed Motor Speech Overall Motor Speech: Appears within functional limits for tasks assessed   Kimara Bencomo L. Tivis Ringer, [place] CCC/SLP Pager [phone]      Juan Quam Laurice 02/04/2014, 1:10 PM

## 2014-02-04 NOTE — Consult Note (Signed)
Physical Medicine and Rehabilitation Consult  Reason for Consult: Left sided weakness, facial droop, left inattention Referring Physician:  Dr. Erlinda Hong.    HPI: [name] is a 68 y.o. female with history of GERD, HTN--no medications X 2 years; who was admitted on 02/02/14 with sudden onset of left sided weakness, facial droop as well as speech difficulty as well as HA.  BP at admission 192/104.  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 Carotid dopplers with 1-39% ICS stenosis. 2D echo with EF 65-70%, calcified MV and grade 1 diastolic dysfunction. Follow up CCT with stable hemorrhage. Therapy initiated and patient limited by left sided weak, left inattention, has difficulty following commands with decreased awareness of deficits. CIR recommended by MD and therapy team.   Review of Systems  Constitutional: Negative for chills and weight loss.  HENT: Negative for ear discharge and nosebleeds.   Respiratory: Negative for cough.   Cardiovascular: Negative for chest pain and palpitations.  Gastrointestinal: Negative for nausea and vomiting.  Neurological: Positive for dizziness, sensory change, speech change and focal weakness.     Past Medical History  Diagnosis Date  . GERD (gastroesophageal reflux disease)   . Arthritis   . Hypertension     History reviewed. No pertinent past surgical history.  Family History  Problem Relation Age of Onset  . Heart disease Mother   . Cancer Brother    Social History:  Married.  reports that she has quit smoking in '70's. Her smoking use included Cigarettes. She started smoking about 45 years ago. She smoked 2.00 packs per day. She does not have any smokeless tobacco history on file. She reports that she does not drink alcohol or use illicit drugs.  Allergies: No Known Allergies  No prescriptions prior to admission    Home: Home  Living Family/patient expects to be discharged to:: Private residence Living Arrangements: Spouse/significant other Available Help at Discharge: Family Type of Home: House Home Access: Level entry (threshold step) Home Layout: One level Home Equipment: None  Lives With: Spouse  Functional History: Prior Function Level of Independence: Independent Comments: At [facility] when stroke occurred Functional Status:  Mobility: Bed Mobility Overal bed mobility: Needs Assistance Bed Mobility: Supine to Sit Supine to sit: Mod assist;+2 for physical assistance General bed mobility comments: Assist for initiation of movement, cues for hand placement and assit to faciliate trunk power up to upright position and provide stability. Transfers Overall transfer level: Needs assistance Equipment used: 1 person hand held assist (face to face with gait belt) Transfers: Sit to/from [employer] Transfers Sit to Stand: Mod assist Stand pivot transfers: Mod assist General transfer comment: slow processing; mod cues for L attention. able to maintain control LLE during mobility. Able to use LUE to assist with handrest on recliner      ADL: ADL Overall ADL's : Needs assistance/impaired Eating/Feeding: Set up;Sitting Grooming: Minimal assistance;Sitting Upper Body Bathing: Minimal assitance;Sitting Lower Body Bathing: Maximal assistance;Sit to/from stand Upper Body Dressing : Moderate assistance;Sitting Lower Body Dressing: Maximal assistance;Sit to/from stand Toilet Transfer: Moderate assistance;Stand-pivot;BSC Toileting- Clothing Manipulation and Hygiene: Maximal assistance;Sit to/from stand Functional mobility during ADLs: Moderate assistance General ADL Comments: affected by L inattention and LUE weakness  Cognition: Cognition Overall Cognitive Status: Impaired/Different from baseline Arousal/Alertness: Lethargic Orientation Level: Oriented to place;Oriented to situation;Oriented to  person Attention: Focused Focused Attention: Impaired Focused Attention Impairment: Verbal basic Memory:  Impaired Memory Impairment: Retrieval deficit Awareness: Impaired Awareness Impairment: Intellectual impairment Problem Solving: Impaired Problem Solving Impairment: Verbal basic;Functional basic Safety/Judgment: Impaired Cognition Arousal/Alertness: Lethargic Behavior During Therapy: Flat affect Overall Cognitive Status: Impaired/Different from baseline Area of Impairment: Attention;Following commands;Safety/judgement;Awareness;Problem solving Current Attention Level: Sustained Following Commands: Follows one step commands consistently Safety/Judgement: Decreased awareness of deficits Awareness: Emergent Problem Solving: Slow processing;Requires verbal cues;Requires tactile cues;Difficulty sequencing General Comments: L inattention  Blood pressure 129/63, pulse 58, temperature 98.2 F (36.8 C), temperature source Oral, resp. rate 20, height 5\' 4"  (1.626 m), weight 101.7 kg (224 lb 3.3 oz), SpO2 99.00%. Physical Exam  Constitutional: She appears well-developed and well-nourished.  Obese, sitting in chair eating   HENT:  Head: Normocephalic and atraumatic.  Right Ear: External ear normal.  Left Ear: External ear normal.  Eyes: Conjunctivae are normal. Pupils are equal, round, and reactive to light.  Neck: No JVD present. No tracheal deviation present. No thyromegaly present.  Cardiovascular: Normal rate.   Respiratory: Effort normal.  GI: She exhibits no distension.  Musculoskeletal:  Bilateral UE edema, left more than right  Lymphadenopathy:    She has no cervical adenopathy.  Neurological:  Left tongue deviation, dysarthric but intelligible. LUE: 1-2 deltoid, bicep, tricep, wrist 2+, HI 2+.  LLE: 2/5 HF, KE and ankle.  Sensation 1/2 Left upper and lower---does sense gross touch.   Displays reasonable insight and awareness  Psychiatric: She has a normal mood and  affect. Her behavior is normal.    Results for orders placed during the hospital encounter of 02/02/14 (from the past 24 hour(s))  BASIC METABOLIC PANEL     Status: Abnormal   Collection Time    02/04/14  3:28 AM      Result Value Ref Range   Sodium 140  137 - 147 mEq/L   Potassium 3.9  3.7 - 5.3 mEq/L   Chloride 105  96 - 112 mEq/L   CO2 23  19 - 32 mEq/L   Glucose, Bld 122 (*) 70 - 99 mg/dL   BUN 16  6 - 23 mg/dL   Creatinine, Ser 1.21 (*) 0.50 - 1.10 mg/dL   Calcium 8.9  8.4 - 10.5 mg/dL   GFR calc non Af Amer 45 (*) >90 mL/min   GFR calc Af Amer 52 (*) >90 mL/min   Anion gap 12  5 - 15  CBC     Status: None   Collection Time    02/04/14  3:28 AM      Result Value Ref Range   WBC 10.1  4.0 - 10.5 K/uL   RBC 4.42  3.87 - 5.11 MIL/uL   Hemoglobin 12.3  12.0 - 15.0 g/dL   HCT 38.2  36.0 - 46.0 %   MCV 86.4  78.0 - 100.0 fL   MCH 27.8  26.0 - 34.0 pg   MCHC 32.2  30.0 - 36.0 g/dL   RDW 14.0  11.5 - 15.5 %   Platelets 241  150 - 400 K/uL   Ct Head Wo Contrast  02/03/2014   CLINICAL DATA:  Intracranial hemorrhage  EXAM: CT HEAD WITHOUT CONTRAST  TECHNIQUE: Contiguous axial images were obtained from the base of the skull through the vertex without intravenous contrast.  COMPARISON:  02/02/2014  FINDINGS: Right thalamic hemorrhage unchanged measuring approximately 25 x 20 mm. There is extension into the right lateral ventricle. Moderate blood in the right lateral ventricle is unchanged. No hydrocephalus. No new hemorrhage  Ventricle size is normal.  No acute ischemic infarct or mass lesion.  IMPRESSION: Right thalamic hemorrhage with ventricular extension unchanged from yesterday.   Electronically Signed   By: Franchot Gallo M.D.   On: 02/03/2014 09:42   Ct Head (brain) Wo Contrast  02/03/2014   CLINICAL DATA:  Left-sided weakness.  EXAM: CT HEAD WITHOUT CONTRAST  TECHNIQUE: Contiguous axial images were obtained from the base of the skull through the vertex without intravenous  contrast.  COMPARISON:  None.  FINDINGS: There is a hematoma in the right thymic area measuring a maximum of 2.6 x 1.9 cm. There is mass effect on the third ventricle and there is blood in the right lateral ventricle. No ventricular dilatation. No sub fluid collection. There are benign-appearing basal ganglia calcifications. Periventricular white matter disease is noted. The brainstem and cerebellum are grossly normal. The CSF spaces around the brainstem are maintained.  The bony structures are unremarkable. The paranasal sinuses and mastoid air cells are clear. The globes are intact.  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 This also blood in the right lateral ventricle.   Electronically Signed   By: Kalman Jewels M.D.   On: 02/03/2014 00:15    Assessment/Plan: Diagnosis: right thalamic hemorrhage 1. Does the need for close, 24 hr/day medical supervision in concert with the patient's rehab needs make it unreasonable for this patient to be served in a less intensive setting? Yes 2. Co-Morbidities requiring supervision/potential complications: htn, bilateral knee pain, obesity 3. Due to bladder management, bowel management, safety, skin/wound care, disease management, medication administration, pain management and patient education, does the patient require 24 hr/day rehab nursing? Yes 4. Does the patient require coordinated care of a physician, rehab nurse, PT (1-2 hrs/day, 5 days/week), OT (1-2 hrs/day, 5 days/week) and SLP (1-2 hrs/day, 5 days/week) to address physical and functional deficits in the context of the above medical diagnosis(es)? Yes Addressing deficits in the following areas: balance, endurance, locomotion, strength, transferring, bowel/bladder control, bathing, dressing, feeding, grooming, toileting, cognition, speech and psychosocial support 5. Can the patient actively participate in an intensive therapy program of at least 3 hrs of therapy per day at  least 5 days per week? Yes 6. The potential for patient to make measurable gains while on inpatient rehab is excellent 7. Anticipated functional outcomes upon discharge from inpatient rehab are supervision  with PT, supervision with OT, modified independent with SLP. 8. Estimated rehab length of stay to reach the above functional goals is: 13-17 days 9. Does the patient have adequate social supports to accommodate these discharge functional goals? Yes 10. Anticipated D/C setting: Home 11. Anticipated post D/C treatments: HH therapy and Outpatient therapy 12. Overall Rehab/Functional Prognosis: excellent  RECOMMENDATIONS: This patient's condition is appropriate for continued rehabilitative care in the following setting: CIR Patient has agreed to participate in recommended program. Yes Note that insurance prior authorization may be required for reimbursement for recommended care.  Comment: Rehab Admissions Coordinator to follow up.  Thanks,  Meredith Staggers, MD, Mellody Drown     02/04/2014

## 2014-02-04 NOTE — Progress Notes (Signed)
Occupational Therapy Evaluation Patient Details Name: [name] MRN: [id number] DOB: 1946-03-02 Today's Date: 02/04/2014    History of Present Illness [name] is a 68 y.o. female with hx of hypertension not taking medications presenting with left sided weakness. 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   Clinical Impression   PTA, pt was independent with ADL and mobility. Very involved with her church. Pt presents with L hemiparesis and L inattention, in addition to deficits listed below and is an excellent CIR candidate. Pt will benefit from skilled OT services to facilitate D/C to CIR due to below deficits.    Follow Up Recommendations  CIR;Supervision/Assistance - 24 hour    Equipment Recommendations  3 in 1 bedside comode;Tub/shower bench    Recommendations for Other Services Rehab consult     Precautions / Restrictions Precautions Precautions: Fall Precaution Comments: L inattention      Mobility Bed Mobility Overal bed mobility: Needs Assistance Bed Mobility: Supine to Sit     Supine to sit: Mod assist;+2 for ambulation     General bed mobility comments:Good motor control of LLE. Impacted by L inattention  Transfers Overall transfer level: Needs assistance Equipment used: 1 person hand held assist Transfers: Sit to/from [employer] Sit to Stand: Mod assist Stand pivot transfers: Mod assist       General transfer comment: slow processing; mod cues for L attention. able to maintain control LLE during mobility. Able to use LUE to assist with handrest on recliner    Balance Overall balance assessment: Needs assistance Sitting-balance support: Feet supported Sitting balance-Leahy Scale: Poor Sitting balance - Comments: posterior support provided initially Postural control: Posterior lean Standing balance support: Bilateral upper extremity supported;During functional  activity Standing balance-Leahy Scale: Poor Standing balance comment: difficulty attending to movement with LLE, able to perform with increased time and cues to attend                            ADL Overall ADL's : Needs assistance/impaired Eating/Feeding: Set up;Sitting   Grooming: Minimal assistance;Sitting   Upper Body Bathing: Minimal assitance;Sitting   Lower Body Bathing: Maximal assistance;Sit to/from stand   Upper Body Dressing : Moderate assistance;Sitting   Lower Body Dressing: Maximal assistance;Sit to/from stand   Toilet Transfer: Moderate assistance;Stand-pivot;BSC   Toileting- Clothing Manipulation and Hygiene: Maximal assistance;Sit to/from stand       Functional mobility during ADLs: Moderate assistance General ADL Comments: affected by L inattention and LUE weakness     Vision                 Additional Comments: unable to maintain visual attention in L visual field. VF appear intact   Perception     Praxis Praxis Praxis tested?: Within functional limits    Pertinent Vitals/Pain Pain Assessment: No/denies pain     Hand Dominance Right   Extremity/Trunk Assessment Upper Extremity Assessment Upper Extremity Assessment: LUE deficits/detail LUE Deficits / Details: Brunstrom level V (relative independence from synergy) LUE Sensation: decreased light touch LUE Coordination: decreased fine motor;decreased gross motor Able to use LUE to bring washcloth to face and wipe face gently; forgets she has washcloth in her hand; requires mod vc to use washcloth   Lower Extremity Assessment Lower Extremity Assessment: Defer to PT evaluation LLE Deficits / Details: asymetrical weakness LLE, inattention LLE Coordination: decreased fine motor;decreased gross motor   Cervical / Trunk Assessment Cervical / Trunk  Assessment: Other exceptions (increased body habitus) Cervical / Trunk Exceptions: R bias   Communication  Communication Communication: No difficulties   Cognition Arousal/Alertness: Lethargic Behavior During Therapy: Flat affect Overall Cognitive Status: Impaired/Different from baseline Area of Impairment: Attention;Following commands;Safety/judgement;Awareness;Problem solving   Current Attention Level: Sustained   Following Commands: Follows one step commands consistently Safety/Judgement: Decreased awareness of deficits Awareness: Emergent Problem Solving: Slow processing;Requires verbal cues;Requires tactile cues;Difficulty sequencing General Comments: L inattention   General Comments       Exercises       Shoulder Instructions      Home Living Family/patient expects to be discharged to:: Private residence Living Arrangements: Spouse/significant other Available Help at Discharge: Family Type of Home: House Home Access: Level entry (threshold step)     Home Layout: One level     Bathroom Shower/Tub: Tub/shower unit Shower/tub characteristics: Architectural technologist: Standard     Home Equipment: None      Lives With: Spouse    Prior Functioning/Environment Level of Independence: Independent        Comments: At [facility] stroke occurred    OT Diagnosis: Generalized weakness;Cognitive deficits;Disturbance of vision;Hemiplegia non-dominant side   OT Problem List: Decreased strength;Decreased range of motion;Decreased activity tolerance;Impaired balance (sitting and/or standing);Impaired vision/perception;Decreased coordination;Decreased cognition;Decreased safety awareness;Decreased knowledge of use of DME or AE;Impaired sensation;Obesity;Impaired UE functional use   OT Treatment/Interventions: Self-care/ADL training;Therapeutic exercise;Neuromuscular education;DME and/or AE instruction;Therapeutic activities;Cognitive remediation/compensation;Visual/perceptual remediation/compensation;Patient/family education;Balance training    OT Goals(Current goals can be  found in the care plan section) Acute Rehab OT Goals Patient Stated Goal: to be able to take care of myself OT Goal Formulation: With patient Time For Goal Achievement: 02/18/14 Potential to Achieve Goals: Good  OT Frequency: Min 3X/week   Barriers to D/C:            Co-evaluation              End of Session Equipment Utilized During Treatment: Gait belt;Oxygen Nurse Communication: Mobility status;Other (comment) (transfer pt toward R side)  Activity Tolerance: Patient tolerated treatment well Patient left: in chair;with call bell/phone within reach   Time: 1432-1521 OT Time Calculation (min): 49 min Charges:  OT General Charges $OT Visit: 1 Procedure OT Evaluation $Initial OT Evaluation Tier I: 1 Procedure OT Treatments $Self Care/Home Management : 23-37 mins G-Codes:    Brodey Bonn,HILLARY 28-Feb-2014, 3:47 PM   [facility], OTR/L  [phone] 02-28-14

## 2014-02-04 NOTE — Progress Notes (Signed)
Rehab Admissions Coordinator Note:  Patient was screened by Haniyah Maciolek L for appropriateness for an Inpatient Acute Rehab Consult.  At this time, we are recommending Inpatient Rehab consult.  Maleena Eddleman, PT Rehabilitation Admissions Coordinator [phone]  

## 2014-02-04 NOTE — Progress Notes (Signed)
Patient being transferred to 3w bed 9. Family aware. Patient stable. No neurological changes.

## 2014-02-04 NOTE — Progress Notes (Signed)
STROKE TEAM PROGRESS NOTE   HISTORY [name] is a 68 y.o. female with a history of hypertension who is not currently taking any treatment who presents with sudden onset left-sided weakness while in church earlier today 02/02/2014 at 0930 pm.. She also complains of headache. She is been told that she has hypertension the past, but did not like taking medications and therefore has not been under treatment for it for the past 2 years. Patient was not administered TPA secondary to hemorrhage. She was admitted to the neuro ICU for further evaluation and treatment.   SUBJECTIVE (INTERVAL HISTORY) Her husband is at the bedside.  Overall she feels her condition is stable. She denies any headache, N/V. She still has high BP overnight and she was kept on cardene drip. Will loose BP goal today and try to wean off cardene and transfer to floor if able.   OBJECTIVE  Ct Head Wo Contrast 02/03/2014 9:35am  IMPRESSION: Right thalamic hemorrhage with ventricular extension unchanged from yesterday.     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 This also blood in the right lateral ventricle.     CUS - 1-39% internal carotid artery stenosis bilaterally. Vertebral arteries are patent with antegrade flow.   Echo - - Left ventricle: The cavity size was normal. Wall thickness was normal. Systolic function was vigorous. The estimated ejection fraction was in the range of 65% to 70%. Doppler parameters are consistent with abnormal left ventricular relaxation (grade 1 diastolic dysfunction). - Mitral valve: Calcified annulus. Mildly thickened leaflets .  BMET    Component Value Date/Time   NA 140 02/04/2014 0328   K 3.9 02/04/2014 0328   CL 105 02/04/2014 0328   CO2 23 02/04/2014 0328   GLUCOSE 122* 02/04/2014 0328   BUN 16 02/04/2014 0328   CREATININE 1.21* 02/04/2014 0328   CREATININE 1.27* 04/08/2012 1109   CALCIUM 8.9 02/04/2014 0328   GFRNONAA 45*  02/04/2014 0328   GFRNONAA 50* 03/04/2012 1644   GFRAA 52* 02/04/2014 0328   GFRAA 58* 03/04/2012 1644   CBC    Component Value Date/Time   WBC 10.1 02/04/2014 0328   RBC 4.42 02/04/2014 0328   HGB 12.3 02/04/2014 0328   HCT 38.2 02/04/2014 0328   PLT 241 02/04/2014 0328   MCV 86.4 02/04/2014 0328   MCH 27.8 02/04/2014 0328   MCHC 32.2 02/04/2014 0328   RDW 14.0 02/04/2014 0328   LYMPHSABS 1.8 02/02/2014 2340   MONOABS 0.6 02/02/2014 2340   EOSABS 0.1 02/02/2014 2340   BASOSABS 0.1 02/02/2014 2340   LDL 136 and A1C 5.8  PHYSICAL EXAM  Temp:  [98 F (36.7 C)-99.4 F (37.4 C)] 98.2 F (36.8 C) (08/21 0800) Pulse Rate:  [57-83] 57 (08/21 1200) Resp:  [16-29] 21 (08/21 1200) BP: (119-146)/(55-80) 146/67 mmHg (08/21 1200) SpO2:  [90 %-99 %] 99 % (08/21 1200)  General - Well nourished, well developed, in no apparent distress.  Ophthalmologic - not able to see through.  Cardiovascular - Regular rate and rhythm with no murmur.  Mental Status -  Level of arousal and orientation to time, place, and person were intact. Language including expression, naming, repetition, comprehension was assessed and found intact. Fund of Knowledge was assessed and was intact.  Cranial Nerves II - XII - II - Visual field intact OU. III, IV, VI - Extraocular movements intact. V - Facial sensation intact bilaterally. VII - Facial movement intact bilaterally. VIII - Hearing &  vestibular intact bilaterally. X - Palate elevates symmetrically. XI - Chin turning & shoulder shrug intact bilaterally. XII - Tongue protrusion intact.  Motor Strength - The patient's strength was 4-/5 LUE proximal, 5-/5 distal, 4+/5 LLE proximal and 5/5 distally. positive pronator drift on the left. Bulk was normal and fasciculations were absent.   Motor Tone - Muscle tone was assessed at the neck and appendages and was normal.  Reflexes - The patient's reflexes were normal in all extremities and she had no pathological  reflexes.  Sensory - Light touch, temperature/pinprick were assessed and were normal.    Coordination - The patient had normal movements in the hands and feet with no ataxia or dysmetria.  Tremor was absent.  Gait and Station - not tested due to BP concern.   ASSESSMENT/PLAN  [name] is a 68 y.o. female with hx of hypertension not taking medications presenting with left sided weakness. 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 Volume: 15 cc. Placed on cardene drip. Stroke work up underway. Repeat CT in am showed stable hematoma and ventricular extension.  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  2D Echo unremarkable  Carotid unremarkable   SCDs and heparin subq for VTE prophylaxis.   Clear Liquid thin liquids, waiting for swallow eval.   Therapy needs: CIR  Risk factor management/education  Patient counseled to be compliant with his antithrombotic medications  Malignant Hypertension   BP 218/107 on admission  Still on cardene drip this am  Home meds:  Metoprolol 12.5, hygroton 12.5.   on metoprolol 50mg  and amlodipine 10mg   BP 130-150/50-60 past 24h  SBP goal < 160  try to wean off cardene  Patient counseled to be compliant with his blood pressure medications  Hyperlipidemia Hx  LDL 136, will hold off statin for now due to [place]  Other Stroke Risk Factors   Obesity, Body mass index is 38.47 kg/(m^2).    Family hx stroke (brother with [place])  Hospital day # 2  This patient is critically ill due to left BG bleeding and difficulty BP control and at significant risk of neurological worsening, death form hematoma  expansion, hydrocephalus, brain edema and cerebral herniation. This patient's care requires constant monitoring of vital signs, hemodynamics, respiratory and cardiac monitoring, review of multiple databases, neurological assessment, discussion with family, other specialists and medical decision making of high complexity. I spent 31 minutes of neurocritical care time in the care of this patient.  Rosalin Hawking, MD PhD Stroke Neurology 02/04/2014 2:42 PM  To contact Stroke Continuity provider, please refer to [email or web address] After hours, contact General Neurology

## 2014-02-05 DIAGNOSIS — I619 Nontraumatic intracerebral hemorrhage, unspecified: Principal | ICD-10-CM

## 2014-02-05 LAB — BASIC METABOLIC PANEL
Anion gap: 14 (ref 5–15)
BUN: 18 mg/dL (ref 6–23)
CO2: 23 mEq/L (ref 19–32)
Calcium: 9.3 mg/dL (ref 8.4–10.5)
Chloride: 103 mEq/L (ref 96–112)
Creatinine, Ser: 1.25 mg/dL — ABNORMAL HIGH (ref 0.50–1.10)
GFR calc Af Amer: 50 mL/min — ABNORMAL LOW (ref >90)
GFR, EST NON AFRICAN AMERICAN: 43 mL/min — AB (ref >90)
GLUCOSE: 99 mg/dL (ref 70–99)
POTASSIUM: 4.2 meq/L (ref 3.7–5.3)
SODIUM: 140 meq/L (ref 137–147)

## 2014-02-05 LAB — CBC
HCT: 41.4 % (ref 36.0–46.0)
Hemoglobin: 13.5 g/dL (ref 12.0–15.0)
MCH: 28.2 pg (ref 26.0–34.0)
MCHC: 32.6 g/dL (ref 30.0–36.0)
MCV: 86.4 fL (ref 78.0–100.0)
PLATELETS: 260 10*3/uL (ref 150–400)
RBC: 4.79 MIL/uL (ref 3.87–5.11)
RDW: 13.9 % (ref 11.5–15.5)
WBC: 8.7 10*3/uL (ref 4.0–10.5)

## 2014-02-05 MED ORDER — METOPROLOL TARTRATE 50 MG PO TABS
75.0000 mg | ORAL_TABLET | Freq: Two times a day (BID) | ORAL | Status: DC
  Administered 2014-02-05 – 2014-02-08 (×7): 75 mg via ORAL
  Filled 2014-02-05 (×10): qty 1

## 2014-02-05 NOTE — Progress Notes (Signed)
STROKE TEAM PROGRESS NOTE   HISTORY [name] is a 68 y.o. female with a history of hypertension who is not currently taking any treatment who presents with sudden onset left-sided weakness while in church earlier today 02/02/2014 at 0930 pm.. She also complains of headache. She is been told that she has hypertension the past, but did not like taking medications and therefore has not been under treatment for it for the past 2 years. Patient was not administered TPA secondary to hemorrhage. She was admitted to the neuro ICU for further evaluation and treatment.   SUBJECTIVE (INTERVAL HISTORY) No family is at the bedside.  Overall she feels her condition is stable. She denies any headache, N/V. She still has slightly high BP overnight and will increase her metoprolol dose.   OBJECTIVE  Ct Head Wo Contrast 02/03/2014 9:35am  IMPRESSION: Right thalamic hemorrhage with ventricular extension unchanged from yesterday.     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 This also blood in the right lateral ventricle.     CUS - 1-39% internal carotid artery stenosis bilaterally. Vertebral arteries are patent with antegrade flow.   Echo - - Left ventricle: The cavity size was normal. Wall thickness was normal. Systolic function was vigorous. The estimated ejection fraction was in the range of 65% to 70%. Doppler parameters are consistent with abnormal left ventricular relaxation (grade 1 diastolic dysfunction). - Mitral valve: Calcified annulus. Mildly thickened leaflets .  BMET    Component Value Date/Time   NA 140 02/05/2014 0550   K 4.2 02/05/2014 0550   CL 103 02/05/2014 0550   CO2 23 02/05/2014 0550   GLUCOSE 99 02/05/2014 0550   BUN 18 02/05/2014 0550   CREATININE 1.25* 02/05/2014 0550   CREATININE 1.27* 04/08/2012 1109   CALCIUM 9.3 02/05/2014 0550   GFRNONAA 43* 02/05/2014 0550   GFRNONAA 50* 03/04/2012 1644   GFRAA 50* 02/05/2014 0550    GFRAA 58* 03/04/2012 1644   CBC    Component Value Date/Time   WBC 8.7 02/05/2014 0550   RBC 4.79 02/05/2014 0550   HGB 13.5 02/05/2014 0550   HCT 41.4 02/05/2014 0550   PLT 260 02/05/2014 0550   MCV 86.4 02/05/2014 0550   MCH 28.2 02/05/2014 0550   MCHC 32.6 02/05/2014 0550   RDW 13.9 02/05/2014 0550   LYMPHSABS 1.8 02/02/2014 2340   MONOABS 0.6 02/02/2014 2340   EOSABS 0.1 02/02/2014 2340   BASOSABS 0.1 02/02/2014 2340   LDL 136 and A1C 5.8  PHYSICAL EXAM  Temp:  [97.4 F (36.3 C)-99.3 F (37.4 C)] 98.8 F (37.1 C) (08/22 0853) Pulse Rate:  [58-71] 64 (08/22 0853) Resp:  [13-22] 18 (08/22 0853) BP: (102-176)/(58-89) 142/78 mmHg (08/22 0853) SpO2:  [91 %-99 %] 96 % (08/22 0853) Weight:  [230 lb 2.6 oz (104.4 kg)] 230 lb 2.6 oz (104.4 kg) (08/21 2338)  General - Well nourished, well developed, in no apparent distress.  Ophthalmologic - not able to see through.  Cardiovascular - Regular rate and rhythm with no murmur.  Mental Status -  Level of arousal and orientation to time, place, and person were intact. Language including expression, naming, repetition, comprehension was assessed and found intact. Fund of Knowledge was assessed and was intact.  Cranial Nerves II - XII - II - Visual field intact OU. III, IV, VI - Extraocular movements intact. V - Facial sensation intact bilaterally. VII - Facial movement intact bilaterally. VIII - Hearing & vestibular  intact bilaterally. X - Palate elevates symmetrically. XI - Chin turning & shoulder shrug intact bilaterally. XII - Tongue protrusion intact.  Motor Strength - The patient's strength was 4-/5 LUE proximal, 5-/5 distal, 4+/5 LLE proximal and 5/5 distally. positive pronator drift on the left. Bulk was normal and fasciculations were absent.   Motor Tone - Muscle tone was assessed at the neck and appendages and was normal.  Reflexes - The patient's reflexes were normal in all extremities and she had no pathological  reflexes.  Sensory - Light touch, temperature/pinprick were assessed and were normal.    Coordination - The patient had normal movements in the hands and feet with no ataxia or dysmetria.  Tremor was absent.  Gait and Station - not tested due to BP concern.   ASSESSMENT/PLAN  [name] is a 67 y.o. female with hx of hypertension not taking medications presenting with left sided weakness. 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 Volume: 15 cc. Placed on cardene drip. Stroke work up underway. Repeat CT in am showed stable hematoma and ventricular extension.  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  2D Echo unremarkable  Carotid unremarkable   SCDs and heparin subq for VTE prophylaxis.   Cardiac thin liquids   Therapy needs: CIR  Risk factor management/education  Patient counseled to be compliant with his antithrombotic medications  Malignant Hypertension   BP 218/107 on admission  Still on cardene drip this am  Home meds:  Metoprolol 12.5, hygroton 12.5.   increase metoprolol to 75mg  and continue amlodipine 10mg   BP 130-150/50-60 past 24h  SBP goal < 160  Patient counseled to be compliant with his blood pressure medications  Hyperlipidemia Hx  LDL 136, will hold off statin for now due to [place]  Other Stroke Risk Factors   Obesity, Body mass index is 39.49 kg/(m^2).    Family hx stroke (brother with [place])  Hospital day # 3  Rosalin Hawking, MD PhD Stroke Neurology 02/05/2014 2:54 PM  To contact Stroke Continuity provider, please refer to [email or web address] After hours, contact General Neurology

## 2014-02-05 NOTE — Progress Notes (Signed)
RN called to room by family. Pt c/o of SOB. O2 sat 91% on RA. Pt placed on 1.5 L via [place] O2 sat went up to 98 %. Pt stated she felt better. Will continue to monitor the pt. Hoover Brunette, RN

## 2014-02-06 LAB — CBC
HEMATOCRIT: 41.3 % (ref 36.0–46.0)
Hemoglobin: 12.9 g/dL (ref 12.0–15.0)
MCH: 26.8 pg (ref 26.0–34.0)
MCHC: 31.2 g/dL (ref 30.0–36.0)
MCV: 85.7 fL (ref 78.0–100.0)
Platelets: 216 10*3/uL (ref 150–400)
RBC: 4.82 MIL/uL (ref 3.87–5.11)
RDW: 13.7 % (ref 11.5–15.5)
WBC: 6.2 10*3/uL (ref 4.0–10.5)

## 2014-02-06 LAB — BASIC METABOLIC PANEL
Anion gap: 12 (ref 5–15)
BUN: 22 mg/dL (ref 6–23)
CHLORIDE: 104 meq/L (ref 96–112)
CO2: 25 meq/L (ref 19–32)
Calcium: 9.3 mg/dL (ref 8.4–10.5)
Creatinine, Ser: 1.24 mg/dL — ABNORMAL HIGH (ref 0.50–1.10)
GFR calc Af Amer: 51 mL/min — ABNORMAL LOW (ref >90)
GFR calc non Af Amer: 44 mL/min — ABNORMAL LOW (ref >90)
GLUCOSE: 106 mg/dL — AB (ref 70–99)
Potassium: 3.9 mEq/L (ref 3.7–5.3)
SODIUM: 141 meq/L (ref 137–147)

## 2014-02-06 MED ORDER — ATORVASTATIN CALCIUM 20 MG PO TABS
20.0000 mg | ORAL_TABLET | Freq: Every day | ORAL | Status: DC
  Administered 2014-02-06 – 2014-02-07 (×2): 20 mg via ORAL
  Filled 2014-02-06 (×3): qty 1

## 2014-02-06 NOTE — Progress Notes (Signed)
STROKE TEAM PROGRESS NOTE   HISTORY [name] is a 68 y.o. female with a history of hypertension who is not currently taking any treatment who presents with sudden onset left-sided weakness while in church earlier today 02/02/2014 at 0930 pm.. She also complains of headache. She is been told that she has hypertension the past, but did not like taking medications and therefore has not been under treatment for it for the past 2 years. Patient was not administered TPA secondary to hemorrhage. She was admitted to the neuro ICU for further evaluation and treatment.   SUBJECTIVE (INTERVAL HISTORY) Husband is at the bedside.  Overall she feels her condition is stable. She denies any headache, N/V, but complains of indigestion after breakfast. She still has slightly high BP overnight and will increase her metoprolol dose.   OBJECTIVE  Ct Head Wo Contrast 02/03/2014 9:35am  IMPRESSION: Right thalamic hemorrhage with ventricular extension unchanged from yesterday.     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 This also blood in the right lateral ventricle.     CUS - 1-39% internal carotid artery stenosis bilaterally. Vertebral arteries are patent with antegrade flow.   Echo - - Left ventricle: The cavity size was normal. Wall thickness was normal. Systolic function was vigorous. The estimated ejection fraction was in the range of 65% to 70%. Doppler parameters are consistent with abnormal left ventricular relaxation (grade 1 diastolic dysfunction). - Mitral valve: Calcified annulus. Mildly thickened leaflets .  BMET    Component Value Date/Time   NA 141 02/06/2014 0515   K 3.9 02/06/2014 0515   CL 104 02/06/2014 0515   CO2 25 02/06/2014 0515   GLUCOSE 106* 02/06/2014 0515   BUN 22 02/06/2014 0515   CREATININE 1.24* 02/06/2014 0515   CREATININE 1.27* 04/08/2012 1109   CALCIUM 9.3 02/06/2014 0515   GFRNONAA 44* 02/06/2014 0515   GFRNONAA 50*  03/04/2012 1644   GFRAA 51* 02/06/2014 0515   GFRAA 58* 03/04/2012 1644   CBC    Component Value Date/Time   WBC 6.2 02/06/2014 0515   RBC 4.82 02/06/2014 0515   HGB 12.9 02/06/2014 0515   HCT 41.3 02/06/2014 0515   PLT 216 02/06/2014 0515   MCV 85.7 02/06/2014 0515   MCH 26.8 02/06/2014 0515   MCHC 31.2 02/06/2014 0515   RDW 13.7 02/06/2014 0515   LYMPHSABS 1.8 02/02/2014 2340   MONOABS 0.6 02/02/2014 2340   EOSABS 0.1 02/02/2014 2340   BASOSABS 0.1 02/02/2014 2340   LDL 136 and A1C 5.8  PHYSICAL EXAM  Temp:  [97.4 F (36.3 C)-98.8 F (37.1 C)] 97.4 F (36.3 C) (08/23 1210) Pulse Rate:  [51-63] 63 (08/23 1210) Resp:  [16-18] 18 (08/23 1210) BP: (139-165)/(65-75) 152/75 mmHg (08/23 1210) SpO2:  [94 %-99 %] 99 % (08/23 1210)  General - Well nourished, well developed, in no apparent distress.  Ophthalmologic - not able to see through.  Cardiovascular - Regular rate and rhythm with no murmur.  Mental Status -  Level of arousal and orientation to time, place, and person were intact. Language including expression, naming, repetition, comprehension was assessed and found intact. Fund of Knowledge was assessed and was intact.  Cranial Nerves II - XII - II - Visual field intact OU. III, IV, VI - Extraocular movements intact. V - Facial sensation intact bilaterally. VII - Facial movement intact bilaterally. VIII - Hearing & vestibular intact bilaterally. X - Palate elevates symmetrically. XI - Chin turning &  shoulder shrug intact bilaterally. XII - Tongue protrusion intact.  Motor Strength - The patient's strength was 4-/5 LUE proximal, 5-/5 distal, 4+/5 LLE proximal and 5/5 distally. positive pronator drift on the left. Bulk was normal and fasciculations were absent.   Motor Tone - Muscle tone was assessed at the neck and appendages and was normal.  Reflexes - The patient's reflexes were normal in all extremities and she had no pathological reflexes.  Sensory - Light touch,  temperature/pinprick were assessed and were normal.    Coordination - The patient had normal movements in the hands and feet with no ataxia or dysmetria.  Tremor was absent.  Gait and Station - not tested due to BP concern.   ASSESSMENT/PLAN  [name] is a 68 y.o. female with hx of hypertension not taking medications presenting with left sided weakness. 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 Volume: 15 cc. Placed on cardene drip. Stroke work up underway. Repeat CT in am showed stable hematoma and ventricular extension.  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  2D Echo unremarkable  Carotid unremarkable   SCDs and heparin subq for VTE prophylaxis.   Cardiac thin liquids   Therapy needs: CIR - admit Monday  Risk factor management/education  Patient counseled to be compliant with his antithrombotic medications  Malignant Hypertension   BP 218/107 on admission  Still on cardene drip this am  Home meds:  Metoprolol 12.5, hygroton 12.5.   increase metoprolol to 75mg  and continue amlodipine 10mg   BP 139-165/50-60 past 24h  SBP goal < 160  Patient counseled to be compliant with his blood pressure medications  Hyperlipidemia Hx  LDL 136, start lipitor 20mg  daily today.  Other Stroke Risk Factors   Obesity, Body mass index is 39.49 kg/(m^2).    Family hx stroke (brother with [place])  Hospital day # 4  Rosalin Hawking, MD PhD Stroke Neurology 02/06/2014 1:04 PM  To contact Stroke Continuity provider, please refer to [email or web address] After hours, contact General Neurology

## 2014-02-07 DIAGNOSIS — I629 Nontraumatic intracranial hemorrhage, unspecified: Secondary | ICD-10-CM

## 2014-02-07 LAB — BASIC METABOLIC PANEL
ANION GAP: 12 (ref 5–15)
BUN: 24 mg/dL — ABNORMAL HIGH (ref 6–23)
CHLORIDE: 102 meq/L (ref 96–112)
CO2: 25 meq/L (ref 19–32)
CREATININE: 1.52 mg/dL — AB (ref 0.50–1.10)
Calcium: 9.3 mg/dL (ref 8.4–10.5)
GFR calc Af Amer: 40 mL/min — ABNORMAL LOW (ref >90)
GFR calc non Af Amer: 34 mL/min — ABNORMAL LOW (ref >90)
Glucose, Bld: 118 mg/dL — ABNORMAL HIGH (ref 70–99)
POTASSIUM: 4.2 meq/L (ref 3.7–5.3)
Sodium: 139 mEq/L (ref 137–147)

## 2014-02-07 LAB — CBC
HCT: 39.3 % (ref 36.0–46.0)
HEMOGLOBIN: 12.8 g/dL (ref 12.0–15.0)
MCH: 27.6 pg (ref 26.0–34.0)
MCHC: 32.6 g/dL (ref 30.0–36.0)
MCV: 84.9 fL (ref 78.0–100.0)
PLATELETS: 263 10*3/uL (ref 150–400)
RBC: 4.63 MIL/uL (ref 3.87–5.11)
RDW: 13.8 % (ref 11.5–15.5)
WBC: 7.1 10*3/uL (ref 4.0–10.5)

## 2014-02-07 NOTE — Progress Notes (Signed)
Physical Therapy Treatment Patient Details Name: LORETTA DOUTT MRN: [id number] DOB: [birth year] Today's Date: 02/07/2014    History of Present Illness Ms. EZELL MELIKIAN is a 68 y.o. female with hx of hypertension not taking medications presenting with left sided weakness. 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    PT Comments    Pt progressing with mobility and able to ambulate with assist today. Pt continues to demonstrate left inattention, decreased awareness and cueing needed for all mobility. Dgtr present and assisting throughout session. Family encouraged to sit to pt's left to assist with attention to left, pt encouraged to continue moving LLE to increase strength and function. Will continue to follow to maximize mobility, balance and gait and would benefit from CIR.   Follow Up Recommendations  CIR;Supervision/Assistance - 24 hour     Equipment Recommendations       Recommendations for Other Services       Precautions / Restrictions Precautions Precautions: Fall Precaution Comments: L inattention, left hemiparesis    Mobility  Bed Mobility Overal bed mobility: Needs Assistance Bed Mobility: Rolling;Sidelying to Sit Rolling: Min guard Sidelying to sit: Min assist       General bed mobility comments: cues for sequence with increased time pt able to use rail to roll and min assist to fully elevate trunk from surface  Transfers Overall transfer level: Needs assistance   Transfers: Sit to/from Stand;Stand Pivot Transfers Sit to Stand: Min assist Stand pivot transfers: Min assist       General transfer comment: cues for hand placement, foot position, scooting to edge of surface and assist for anterior translation. With sitting cues for bil hand placement, safety and sequence  Ambulation/Gait Ambulation/Gait assistance: Min assist;+2 safety/equipment (dgtr followed with chair) Ambulation Distance (Feet): 50 Feet (15'  then 52' after seated rest) Assistive device: Rolling walker (2 wheeled) Gait Pattern/deviations: Shuffle;Decreased stride length   Gait velocity interpretation: Below normal speed for age/gender General Gait Details: pt with min assist to maintain grip of LUE on RW, cues to step into RW and midline right to left pt with tendency to pull Rw right. May perform better with hemiwalker. Max cues to scan environment and attend to left   Stairs            Wheelchair Mobility    Modified Rankin (Stroke Patients Only) Modified Rankin (Stroke Patients Only) Pre-Morbid Rankin Score: No symptoms Modified Rankin: Moderately severe disability     Balance Overall balance assessment: Needs assistance   Sitting balance-Leahy Scale: Good       Standing balance-Leahy Scale: Fair                      Cognition Arousal/Alertness: Awake/alert Behavior During Therapy: Flat affect Overall Cognitive Status: Impaired/Different from baseline Area of Impairment: Attention;Following commands;Safety/judgement;Awareness;Problem solving   Current Attention Level: Sustained Memory: Decreased short-term memory;Decreased recall of precautions Following Commands: Follows one step commands consistently Safety/Judgement: Decreased awareness of deficits   Problem Solving: Slow processing;Requires verbal cues;Requires tactile cues;Difficulty sequencing General Comments: L inattention    Exercises      General Comments        Pertinent Vitals/Pain Pain Assessment: 0-10 Pain Score: 6  Pain Location: head Pain Descriptors / Indicators: Aching Pain Intervention(s): Repositioned;Other (comment) (RN notified)    Home Living                      Prior Function  PT Goals (current goals can now be found in the care plan section) Progress towards PT goals: Progressing toward goals    Frequency       PT Plan Current plan remains appropriate    Co-evaluation              End of Session Equipment Utilized During Treatment: Gait belt Activity Tolerance: Patient tolerated treatment well Patient left: in chair;with call bell/phone within reach;with chair alarm set;with family/visitor present     Time: 5374-8270 PT Time Calculation (min): 33 min  Charges:  $Gait Training: 8-22 mins $Therapeutic Activity: 8-22 mins                    G Codes:      Melford Aase 02-12-2014, 1:33 PM Elwyn Reach, [place]

## 2014-02-07 NOTE — Progress Notes (Signed)
Rehab admissions - I met with pt and her family in follow up to rehab MD consult to explain the possibility of inpatient rehab. Questions were answered and pt/family are interested in pursuing this. Several dtrs, aunt and grandson were present. Informational brochures were given.  I will now open her case with [employer] to seek insurance authorization. I will keep the pt/family and medical team aware of any updates.  Brenda, case manager is aware. Thanks.  Janine Turnington, PT Rehabilitation Admissions Coordinator [phone]  

## 2014-02-07 NOTE — Care Management Note (Signed)
    Page 1 of 1   02/07/2014     11:50:13 AM CARE MANAGEMENT NOTE 02/07/2014  Patient:  ARIANY, KESSELMAN   Account Number:  [id number]  Date Initiated:  02/07/2014  Documentation initiated by:  GRAVES-BIGELOW,Casimir Barcellos  Subjective/Objective Assessment:   Pt admitted for Left sided weakness. 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 Per daughters pt is from home with husband.     Action/Plan:   CIR is consulting and if not approved via insurance backup plan home with [facility] services. CM did call the financial counselor per request of family for medicaid assistance.   Anticipated DC Date:  02/07/2014   Anticipated DC Plan:  IP REHAB FACILITY  In-house referral  Financial Counselor      DC Planning Services  CM consult      Choice offered to / List presented to:             Status of service:  In process, will continue to follow Medicare Important Message given?  YES (If response is "NO", the following Medicare IM given date fields will be blank) Date Medicare IM given:  02/07/2014 Medicare IM given by:  GRAVES-BIGELOW,Ruffin Lada Date Additional Medicare IM given:   Additional Medicare IM given by:    Discharge Disposition:    Per UR Regulation:  Reviewed for med. necessity/level of care/duration of stay  If discussed at [place] of Stay Meetings, dates discussed:    Comments:

## 2014-02-07 NOTE — Progress Notes (Signed)
STROKE TEAM PROGRESS NOTE   HISTORY [name] is a 68 y.o. female with a history of hypertension who is not currently taking any treatment who presents with sudden onset left-sided weakness while in church earlier today 02/02/2014 at 0930 pm.. She also complains of headache. She is been told that she has hypertension the past, but did not like taking medications and therefore has not been under treatment for it for the past 2 years. Patient was not administered TPA secondary to hemorrhage. She was admitted to the neuro ICU for further evaluation and treatment.   SUBJECTIVE (INTERVAL HISTORY) Multiple family members are at the bedside. She is feeling better and would like to go home but likely needs inpatient rehabilitation medical stable to go there today if bed is available  OBJECTIVE Temp:  [97.4 F (36.3 C)-98.5 F (36.9 C)] 98.3 F (36.8 C) (08/24 0745) Pulse Rate:  [55-63] 61 (08/24 0900) Resp:  [16-18] 16 (08/24 0745) BP: (138-169)/(63-93) 142/86 mmHg (08/24 0859) SpO2:  [96 %-99 %] 99 % (08/24 0745)  Ct Head Wo Contrast 02/03/2014 9:35am  IMPRESSION: Right thalamic hemorrhage with ventricular extension unchanged from yesterday.     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 This also blood in the right lateral ventricle.     CUS - 1-39% internal carotid artery stenosis bilaterally. Vertebral arteries are patent with antegrade flow.   Echo - - Left ventricle: The cavity size was normal. Wall thickness was normal. Systolic function was vigorous. The estimated ejection fraction was in the range of 65% to 70%. Doppler parameters are consistent with abnormal left ventricular relaxation (grade 1 diastolic dysfunction). - Mitral valve: Calcified annulus. Mildly thickened leaflets .  BMET    Component Value Date/Time   NA 139 02/07/2014 0345   K 4.2 02/07/2014 0345   CL 102 02/07/2014 0345   CO2 25 02/07/2014 0345   GLUCOSE 118*  02/07/2014 0345   BUN 24* 02/07/2014 0345   CREATININE 1.52* 02/07/2014 0345   CREATININE 1.27* 04/08/2012 1109   CALCIUM 9.3 02/07/2014 0345   GFRNONAA 34* 02/07/2014 0345   GFRNONAA 50* 03/04/2012 1644   GFRAA 40* 02/07/2014 0345   GFRAA 58* 03/04/2012 1644   CBC    Component Value Date/Time   WBC 7.1 02/07/2014 0345   RBC 4.63 02/07/2014 0345   HGB 12.8 02/07/2014 0345   HCT 39.3 02/07/2014 0345   PLT 263 02/07/2014 0345   MCV 84.9 02/07/2014 0345   MCH 27.6 02/07/2014 0345   MCHC 32.6 02/07/2014 0345   RDW 13.8 02/07/2014 0345   LYMPHSABS 1.8 02/02/2014 2340   MONOABS 0.6 02/02/2014 2340   EOSABS 0.1 02/02/2014 2340   BASOSABS 0.1 02/02/2014 2340   Lipid Panel     Component Value Date/Time   CHOL 195 02/03/2014 1050   TRIG 87 02/03/2014 1050   HDL 42 02/03/2014 1050   CHOLHDL 4.6 02/03/2014 1050   VLDL 17 02/03/2014 1050   LDLCALC 136* 02/03/2014 1050   A1C 5.8  PHYSICAL EXAM  Temp:  [97.4 F (36.3 C)-98.5 F (36.9 C)] 98.3 F (36.8 C) (08/24 0745) Pulse Rate:  [55-63] 61 (08/24 0900) Resp:  [16-18] 16 (08/24 0745) BP: (138-169)/(63-93) 142/86 mmHg (08/24 0859) SpO2:  [96 %-99 %] 99 % (08/24 0745)  General - obese middle aged african american lady., in no apparent distress.  Ophthalmologic - not able to see through.  Cardiovascular - Regular rate and rhythm with no murmur.  Mental Status -  Level of arousal and orientation to time, place, and person were intact. Language including expression, naming, repetition, comprehension was assessed and found intact. Fund of Knowledge was assessed and was intact.  Cranial Nerves II - XII - II - Visual field intact OU. III, IV, VI - Extraocular movements intact. V - Facial sensation intact bilaterally. VII - Facial movement intact bilaterally. VIII - Hearing & vestibular intact bilaterally. X - Palate elevates symmetrically. XI - Chin turning & shoulder shrug intact bilaterally. XII - Tongue protrusion intact.  Motor Strength -  The patient's strength was 4-/5 LUE proximal, 5-/5 distal, 4+/5 LLE proximal and 5/5 distally. positive pronator drift on the left. Bulk was normal and fasciculations were absent.   Motor Tone - Muscle tone was assessed at the neck and appendages and was normal.  Reflexes - The patient's reflexes were normal in all extremities and she had no pathological reflexes.  Sensory - Light touch, temperature/pinprick were assessed and were normal.    Coordination - The patient had normal movements in the hands and feet with no ataxia or dysmetria.  Tremor was absent.  Gait and Station - not tested due to BP concern.   ASSESSMENT/PLAN  [name] is a 68 y.o. female with hx of hypertension not taking medications presenting with left sided weakness. 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 Volume: 15 cc. Placed on cardene drip. Stroke work up underway. Repeat CT in am showed stable hematoma and ventricular extension.  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  2D Echo unremarkable  Carotid unremarkable   SCDs and heparin subq for VTE prophylaxis.   Cardiac thin liquids   Therapy needs: CIR - admit Monday  Risk factor management/education  Patient counseled to be compliant with his antithrombotic medications  Malignant Hypertension   BP 218/107 on admission  Still on cardene drip this am  Home meds:  Metoprolol 12.5, hygroton 12.5.   increase metoprolol to 75mg  and continue amlodipine 10mg   BP 139-165/50-60 past 24h  SBP goal < 160  Patient counseled to be compliant with his blood pressure medications  Hyperlipidemia Hx  LDL 136, start lipitor 20mg  daily today.  Other Stroke Risk Factors   Obesity, Body mass  index is 39.49 kg/(m^2).    Family hx stroke (brother with [place])  Hospital day # 5  Antony Contras, MD   To contact Stroke Continuity provider, please refer to [email or web address] After hours, contact General Neurology

## 2014-02-08 ENCOUNTER — Encounter (HOSPITAL_COMMUNITY): Payer: Self-pay | Admitting: *Deleted

## 2014-02-08 ENCOUNTER — Inpatient Hospital Stay (HOSPITAL_COMMUNITY)
Admission: RE | Admit: 2014-02-08 | Discharge: 2014-02-16 | DRG: 945 | Disposition: A | Discharge status: Home Health | Payer: Medicare HMO | Source: Intra-hospital | Attending: Physical Medicine & Rehabilitation | Admitting: Physical Medicine & Rehabilitation

## 2014-02-08 DIAGNOSIS — F1010 Alcohol abuse, uncomplicated: Secondary | ICD-10-CM

## 2014-02-08 DIAGNOSIS — Z5189 Encounter for other specified aftercare: Secondary | ICD-10-CM | POA: Diagnosis present

## 2014-02-08 DIAGNOSIS — G479 Sleep disorder, unspecified: Secondary | ICD-10-CM

## 2014-02-08 DIAGNOSIS — Z8249 Family history of ischemic heart disease and other diseases of the circulatory system: Secondary | ICD-10-CM

## 2014-02-08 DIAGNOSIS — G609 Hereditary and idiopathic neuropathy, unspecified: Secondary | ICD-10-CM | POA: Diagnosis not present

## 2014-02-08 DIAGNOSIS — I619 Nontraumatic intracerebral hemorrhage, unspecified: Secondary | ICD-10-CM

## 2014-02-08 DIAGNOSIS — Z87891 Personal history of nicotine dependence: Secondary | ICD-10-CM

## 2014-02-08 DIAGNOSIS — I10 Essential (primary) hypertension: Secondary | ICD-10-CM | POA: Diagnosis not present

## 2014-02-08 DIAGNOSIS — R29810 Facial weakness: Secondary | ICD-10-CM | POA: Diagnosis not present

## 2014-02-08 DIAGNOSIS — K2100 Gastro-esophageal reflux disease with esophagitis, without bleeding: Secondary | ICD-10-CM

## 2014-02-08 DIAGNOSIS — G4730 Sleep apnea, unspecified: Secondary | ICD-10-CM | POA: Diagnosis not present

## 2014-02-08 DIAGNOSIS — Z8673 Personal history of transient ischemic attack (TIA), and cerebral infarction without residual deficits: Secondary | ICD-10-CM | POA: Diagnosis present

## 2014-02-08 DIAGNOSIS — K5900 Constipation, unspecified: Secondary | ICD-10-CM | POA: Diagnosis not present

## 2014-02-08 DIAGNOSIS — E669 Obesity, unspecified: Secondary | ICD-10-CM

## 2014-02-08 DIAGNOSIS — I610 Nontraumatic intracerebral hemorrhage in hemisphere, subcortical: Secondary | ICD-10-CM

## 2014-02-08 DIAGNOSIS — K219 Gastro-esophageal reflux disease without esophagitis: Secondary | ICD-10-CM | POA: Diagnosis not present

## 2014-02-08 DIAGNOSIS — N183 Chronic kidney disease, stage 3 unspecified: Secondary | ICD-10-CM

## 2014-02-08 DIAGNOSIS — R42 Dizziness and giddiness: Secondary | ICD-10-CM

## 2014-02-08 DIAGNOSIS — R471 Dysarthria and anarthria: Secondary | ICD-10-CM | POA: Diagnosis not present

## 2014-02-08 DIAGNOSIS — E785 Hyperlipidemia, unspecified: Secondary | ICD-10-CM

## 2014-02-08 DIAGNOSIS — I517 Cardiomegaly: Secondary | ICD-10-CM

## 2014-02-08 HISTORY — DX: Shortness of breath: R06.02

## 2014-02-08 HISTORY — DX: Sleep apnea, unspecified: G47.30

## 2014-02-08 LAB — BASIC METABOLIC PANEL
ANION GAP: 15 (ref 5–15)
BUN: 29 mg/dL — ABNORMAL HIGH (ref 6–23)
CALCIUM: 9.9 mg/dL (ref 8.4–10.5)
CHLORIDE: 102 meq/L (ref 96–112)
CO2: 23 mEq/L (ref 19–32)
CREATININE: 1.47 mg/dL — AB (ref 0.50–1.10)
GFR calc non Af Amer: 36 mL/min — ABNORMAL LOW (ref >90)
GFR, EST AFRICAN AMERICAN: 41 mL/min — AB (ref >90)
Glucose, Bld: 111 mg/dL — ABNORMAL HIGH (ref 70–99)
Potassium: 4.2 mEq/L (ref 3.7–5.3)
SODIUM: 140 meq/L (ref 137–147)

## 2014-02-08 LAB — CBC
HCT: 41 % (ref 36.0–46.0)
Hemoglobin: 13.1 g/dL (ref 12.0–15.0)
MCH: 27.8 pg (ref 26.0–34.0)
MCHC: 32 g/dL (ref 30.0–36.0)
MCV: 86.9 fL (ref 78.0–100.0)
Platelets: 283 10*3/uL (ref 150–400)
RBC: 4.72 MIL/uL (ref 3.87–5.11)
RDW: 13.7 % (ref 11.5–15.5)
WBC: 8.2 10*3/uL (ref 4.0–10.5)

## 2014-02-08 MED ORDER — SORBITOL 70 % SOLN
30.0000 mL | Freq: Every day | Status: DC | PRN
  Administered 2014-02-15: 30 mL via ORAL
  Filled 2014-02-08: qty 30

## 2014-02-08 MED ORDER — ACETAMINOPHEN 325 MG PO TABS: 650.0000 mg | ORAL_TABLET | ORAL | Status: DC | PRN

## 2014-02-08 MED ORDER — METOPROLOL TARTRATE 50 MG PO TABS
75.0000 mg | ORAL_TABLET | Freq: Two times a day (BID) | ORAL | Status: DC
  Administered 2014-02-08 – 2014-02-12 (×8): 75 mg via ORAL
  Filled 2014-02-08 (×10): qty 1

## 2014-02-08 MED ORDER — GABAPENTIN 100 MG PO CAPS
100.0000 mg | ORAL_CAPSULE | Freq: Two times a day (BID) | ORAL | Status: DC
  Administered 2014-02-08 – 2014-02-16 (×16): 100 mg via ORAL
  Filled 2014-02-08 (×20): qty 1

## 2014-02-08 MED ORDER — ONDANSETRON HCL 4 MG/2ML IJ SOLN: 4.0000 mg | Freq: Four times a day (QID) | INTRAMUSCULAR | Status: DC | PRN

## 2014-02-08 MED ORDER — ACETAMINOPHEN 325 MG PO TABS
325.0000 mg | ORAL_TABLET | ORAL | Status: DC | PRN
  Administered 2014-02-08 – 2014-02-13 (×8): 650 mg via ORAL
  Filled 2014-02-08 (×8): qty 2

## 2014-02-08 MED ORDER — HEPARIN SODIUM (PORCINE) 5000 UNIT/ML IJ SOLN
5000.0000 [IU] | Freq: Three times a day (TID) | INTRAMUSCULAR | Status: DC
  Administered 2014-02-09 – 2014-02-16 (×23): 5000 [IU] via SUBCUTANEOUS
  Filled 2014-02-08 (×26): qty 1

## 2014-02-08 MED ORDER — AMLODIPINE BESYLATE 10 MG PO TABS: 10.0000 mg | ORAL_TABLET | Freq: Every day | ORAL | Status: DC

## 2014-02-08 MED ORDER — ATORVASTATIN CALCIUM 20 MG PO TABS
20.0000 mg | ORAL_TABLET | Freq: Every day | ORAL | Status: DC
  Administered 2014-02-08 – 2014-02-15 (×8): 20 mg via ORAL
  Filled 2014-02-08 (×9): qty 1

## 2014-02-08 MED ORDER — HEPARIN SODIUM (PORCINE) 5000 UNIT/ML IJ SOLN: 5000.0000 [IU] | Freq: Three times a day (TID) | INTRAMUSCULAR | Status: DC

## 2014-02-08 MED ORDER — ONDANSETRON HCL 4 MG PO TABS: 4.0000 mg | ORAL_TABLET | Freq: Four times a day (QID) | ORAL | Status: DC | PRN

## 2014-02-08 MED ORDER — SORBITOL 70 % SOLN
30.0000 mL | Freq: Every day | Status: DC | PRN
  Filled 2014-02-08: qty 30

## 2014-02-08 MED ORDER — PANTOPRAZOLE SODIUM 40 MG PO TBEC: 40.0000 mg | DELAYED_RELEASE_TABLET | Freq: Every day | ORAL | Status: DC

## 2014-02-08 MED ORDER — ONDANSETRON HCL 4 MG PO TABS
4.0000 mg | ORAL_TABLET | Freq: Four times a day (QID) | ORAL | Status: DC | PRN
Start: 2014-02-08 — End: 2014-02-08

## 2014-02-08 MED ORDER — AMLODIPINE BESYLATE 10 MG PO TABS
10.0000 mg | ORAL_TABLET | Freq: Every day | ORAL | Status: DC
  Administered 2014-02-09 – 2014-02-16 (×8): 10 mg via ORAL
  Filled 2014-02-08 (×10): qty 1

## 2014-02-08 MED ORDER — ATORVASTATIN CALCIUM 20 MG PO TABS
20.0000 mg | ORAL_TABLET | Freq: Every day | ORAL | Status: DC
  Filled 2014-02-08: qty 1

## 2014-02-08 MED ORDER — METOPROLOL TARTRATE 50 MG PO TABS
75.0000 mg | ORAL_TABLET | Freq: Two times a day (BID) | ORAL | Status: DC
  Filled 2014-02-08: qty 1

## 2014-02-08 MED ORDER — SENNOSIDES-DOCUSATE SODIUM 8.6-50 MG PO TABS
1.0000 | ORAL_TABLET | Freq: Two times a day (BID) | ORAL | Status: DC
  Filled 2014-02-08: qty 1

## 2014-02-08 MED ORDER — PANTOPRAZOLE SODIUM 40 MG PO TBEC
40.0000 mg | DELAYED_RELEASE_TABLET | Freq: Every day | ORAL | Status: DC
  Administered 2014-02-09 – 2014-02-16 (×8): 40 mg via ORAL
  Filled 2014-02-08 (×10): qty 1

## 2014-02-08 MED ORDER — SENNOSIDES-DOCUSATE SODIUM 8.6-50 MG PO TABS
1.0000 | ORAL_TABLET | Freq: Two times a day (BID) | ORAL | Status: DC
  Administered 2014-02-08 – 2014-02-16 (×15): 1 via ORAL
  Filled 2014-02-08 (×19): qty 1

## 2014-02-08 NOTE — Progress Notes (Signed)
Received pt. As a transfer from [address].Pt. And family were oriented to unit.Safety video was showed,safety plan was sign.Pt. Skin is intact.No equipment at bedside.Keep monitoring pt. Closely and assessing her needs.

## 2014-02-08 NOTE — Progress Notes (Signed)
[facility] Admission Coordinator Signed Physical Medicine and Rehabilitation PMR Pre-admission Service date: 02/08/2014 11:14 AM  Related encounter: Admission (Discharged) from 02/02/2014 in [place]   PMR Admission Coordinator Pre-Admission Assessment  Patient: [name] is an 68 y.o., female  MRN: [id number]  DOB: [birth year]  Height: _0  (162.6 cm)  Weight: 101.878 kg (224 lb 9.6 oz)  Insurance Information  HMO: yes PPO: PCP: IPA: 80/20: OTHER:  PRIMARY[employer]arthur Rossetti Medicare HMO Policy#: [id number] Subscriber: self  CM Name: Clayborne Artist, RN Phone#: [phone] Fax#: [phone]  Follow up will be with onsite reviewer Shae.  Pre-Cert#: [id number] Employer: retired  Benefits: Phone #: [phone] Name: Tommas Olp. Date: 06-17-12 Deduct: none Out of Pocket Max: $4900 (met $45.00)  Life Max: unlimited  CIR: $245/day for days 1-7 SNF: $0/day for days 1-20; $156/copay per day for days 21-100 (100 days visit)  Outpatient: 100% Co-Pay: $10 copay  Home Health: 100% Co-Pay: no copay, visit limit based on medical necessity  DME: 80% Co-Pay: 20%  Providers: in network  Family would like assistance in starting the Medicaid application. Acute care case manager aware and states that she has contacted Development worker, community.  Emergency Contact Information    Contact Information     Name  Relation  Home  Work  Mobile     Geron,James   [phone]   Wake  Daughter  [phone]   [phone]     Deeann Saint  Daughter    [phone]     Churchill,Betty  Daughter    [phone]        Current Medical History  Patient Admitting Diagnosis: right thalamic hemorrhage  History of Present Illness: [name] is a 68 y.o. female with history of GERD, HTN--no medications X 2 years; who was admitted on 02/02/14 with sudden onset of left sided weakness, facial droop as well as speech difficulty as well as HA. BP at admission  192/104. 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 Carotid dopplers with 1-39% ICS stenosis. 2D echo with EF 65-70%, calcified MV and grade 1 diastolic dysfunction. Follow up CCT with stable hemorrhage. Therapy initiated and patient limited by left sided weak, left inattention, has difficulty following commands with decreased awareness of deficits. CIR recommended by MD and therapy team.  NIH Total: 4  Past Medical History    Past Medical History    Diagnosis  Date    .  GERD (gastroesophageal reflux disease)     .  Arthritis     .  Hypertension      Family History  family history includes Cancer in her brother; Heart disease in her mother.  Prior Rehab/Hospitalizations: none  Current Medications  Current facility-administered medications: stroke: mapping our early stages of recovery book, , Does not apply, Once, Roland Rack, MD; acetaminophen (TYLENOL) tablet 650 mg, 650 mg, Oral, Q4H PRN, Roland Rack, MD, 650 mg at 02/08/14 0311; amLODipine (NORVASC) tablet 10 mg, 10 mg, Oral, Daily, Rosalin Hawking, MD, 10 mg at 02/08/14 1050; atorvastatin (LIPITOR) tablet 20 mg, 20 mg, Oral, q1800, Rosalin Hawking, MD, 20 mg at 02/07/14 1701  heparin injection 5,000 Units, 5,000 Units, Subcutaneous, 3 times per day, Rosalin Hawking, MD, 5,000 Units at 02/07/14 2200; labetalol (NORMODYNE,TRANDATE) injection 10-20 mg, 10-20 mg, Intravenous, Q2H PRN, Rosalin Hawking, MD, 10 mg at 02/05/14 0446; metoprolol tartrate (LOPRESSOR) tablet  75 mg, 75 mg, Oral, BID, Rosalin Hawking, MD, 75 mg at 02/08/14 1050; pantoprazole (PROTONIX) EC tablet 40 mg, 40 mg, Oral, Daily, Rosalin Hawking, MD, 40 mg at 02/08/14 1050  senna-docusate (Senokot-S) tablet 1 tablet, 1 tablet, Oral, BID, Roland Rack, MD, 1 tablet at 02/08/14 1050  Patients Current Diet: Cardiac  Precautions / Restrictions  Precautions  Precautions: Fall  Precaution Comments: L  inattention  Prior Activity Level  Community (5-7x/wk): Pt was independent prior to admit, enjoys shopping and her flowers.  Home Assistive Devices / Equipment  Home Assistive Devices/Equipment: None  Home Equipment: None  Prior Functional Level  Prior Function  Level of Independence: Independent  Comments: At [facility] when stroke occurred  Current Functional Level    Cognition  Arousal/Alertness: Lethargic  Overall Cognitive Status: Impaired/Different from baseline  Current Attention Level: Selective  Orientation Level: Oriented to person;Oriented to place;Oriented to situation;Disoriented to time  Following Commands: Follows one step commands consistently  Safety/Judgement: Decreased awareness of safety;Decreased awareness of deficits  General Comments: L inattention  Attention: Focused  Focused Attention: Impaired  Focused Attention Impairment: Verbal basic  Memory: Impaired  Memory Impairment: Retrieval deficit  Awareness: Impaired  Awareness Impairment: Intellectual impairment  Problem Solving: Impaired  Problem Solving Impairment: Verbal basic;Functional basic  Safety/Judgment: Impaired    Extremity Assessment  (includes Sensation/Coordination)      ADLs  Overall ADL's : Needs assistance/impaired  Eating/Feeding: Set up;Sitting  Grooming: Minimal assistance;Sitting  Upper Body Bathing: Supervision/ safety;Set up;Sitting  Lower Body Bathing: Maximal assistance;Sit to/from stand  Upper Body Dressing : Moderate assistance;Sitting  Lower Body Dressing: Moderate assistance;Sit to/from stand  Toilet Transfer: Minimal assistance;Ambulation  Toileting- Clothing Manipulation and Hygiene: Maximal assistance;Sit to/from stand  Functional mobility during ADLs: Minimal assistance;Rolling walker  General ADL Comments: On entrance to room, pt ambulating with fmaily @ RW level. Explained to family need to have staff assist pt with all aspects of mobility to reduce risk of falls. Educated  family on L inattention and attnetional deficits caused by CVA. Discussed need to decrease noise and distraction so that pt can work on getting better and the importance of rest peiods during the day so that her brain can "heal". Family verbalized understanding.    Mobility  Overal bed mobility: Needs Assistance  Bed Mobility: Rolling;Sidelying to Sit  Rolling: Min guard  Sidelying to sit: Min assist  Supine to sit: Mod assist;+2 for physical assistance  General bed mobility comments: cues for sequence with increased time pt able to use rail to roll and min assist to fully elevate trunk from surface    Transfers  Overall transfer level: Needs assistance  Equipment used: 1 person hand held assist (face to face with gait belt)  Transfers: Sit to/from Stand;Stand Pivot Transfers  Sit to Stand: Min assist  Stand pivot transfers: Min assist  General transfer comment: vc for positioning and to use L hand to assist with transfers; tactile cues for trunk control to facilitate appropriate weight shifts and control speed of descent    Ambulation / Gait / Stairs / Wheelchair Mobility  Ambulation/Gait  Ambulation/Gait assistance: Min assist;+2 safety/equipment (dgtr followed with chair)  Ambulation Distance (Feet): 50 Feet (15' then 32' after seated rest)  Assistive device: Rolling walker (2 wheeled)  Gait Pattern/deviations: Shuffle;Decreased stride length  Gait velocity interpretation: Below normal speed for age/gender  General Gait Details: pt with min assist to maintain grip of LUE on RW, cues to step into RW and midline right to left  pt with tendency to pull Rw right. May perform better with hemiwalker. Max cues to scan environment and attend to left    Posture / Balance  Dynamic Sitting Balance  Sitting balance - Comments: postural control affected by inattention    Special needs/care consideration  BiPAP/CPAP - pt was fitted for CPAP but never got the unit due to financial limitations  CPM no   Continuous Drip IV no  Dialysis no  Life Vest no  Oxygen no  Special Bed no  Trach Size no  Wound Vac (area) no  Skin - no current issues  Bowel mgmt: last BM on 02-07-14  Bladder mgmt: currently using bedside commode or commode with assist  Diabetic mgmt no  Note: pt prefers female caregivers/staff. I made charge nurse on rehab unit aware.    Previous Home Environment  Living Arrangements: Spouse/significant other  Lives With: Spouse  Available Help at Discharge: Family  Type of Home: House  Home Layout: One level  Home Access: Level entry (threshold step)  Bathroom Shower/Tub: Administrator, Civil Service: [place]: No  Discharge Living Setting  Plans for Discharge Living Setting: Patient's home  Type of Home at Discharge: House  Discharge Home Layout: One level  Discharge Home Access: Stairs to enter  Entrance Stairs-Rails: None  Entrance Stairs-Number of Steps: 2  Does the patient have any problems obtaining your medications?: No  Social/Family/Support Systems  Patient Roles: (involved with her family)  Contact Information: husband and three dtrs (three dtrs are in communication with their father)  Anticipated Caregiver: husband and 3 dtrs  Anticipated Caregiver's Contact Information: see above  Ability/Limitations of Caregiver: none  Caregiver Availability: 24/7  Discharge Plan Discussed with Primary Caregiver: Yes  Is Caregiver In Agreement with Plan?: Yes  Does Caregiver/Family have Issues with Lodging/Transportation while Pt is in Rehab?: No  Goals/Additional Needs  Patient/Family Goal for Rehab: Supervision with PT/OT and Mod Ind with SLP  Expected length of stay: 13-17 days  Cultural Considerations: none  Dietary Needs: heart healthy  Equipment Needs: to be determined  Pt/Family Agrees to Admission and willing to participate: Yes (spoke with 2 daughters and several other family in room)  Program Orientation Provided & Reviewed with  Pt/Caregiver Including Roles & Responsibilities: Yes  Decrease burden of Care through IP rehab admission: NA  Possible need for SNF placement upon discharge: not anticipated  Patient Condition: This patient's medical and functional status has changed since the consult dated: 02-04-14 in which the Rehabilitation Physician determined and documented that the patient's condition is appropriate for intensive rehabilitative care in an inpatient rehabilitation facility. See "History of Present Illness" (above) for medical update. Functional changes are: minimal to moderate assist with mobility, ambulating 50' intervals. Patient's medical and functional status update has been discussed with the Rehabilitation physician and patient remains appropriate for inpatient rehabilitation. Will admit to inpatient rehab today.  Preadmission Screen Completed By: Nanetta Batty, PT, 02/08/2014 1357 AM  ______________________________________________________________________  Discussed status with Dr. Naaman Plummer on 02/08/14 at 1357 and received telephone approval for admission today.  Admission Coordinator: Nanetta Batty, PT, time 1357/Date 02/08/14    Cosigned by: Meredith Staggers, MD [02/08/2014 2:06 PM]

## 2014-02-08 NOTE — Progress Notes (Signed)
Occupational Therapy Treatment Patient Details Name: [name] MRN: [id number] DOB: 1945-08-25 Today's Date: 02/08/2014    History of present illness [name] is a 68 y.o. female with hx of hypertension not taking medications presenting with left sided weakness. 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   OT comments  Pt making steady progress. Increased strength LUE. L inattention. Excellent CIR candidate. Educated family on need to have staff assist with mobilizing pt for safety reasons. Anticipate D/C to CIR.  Follow Up Recommendations  CIR;Supervision/Assistance - 24 hour    Equipment Recommendations  3 in 1 bedside comode;Tub/shower bench    Recommendations for Other Services Rehab consult    Precautions / Restrictions Precautions Precautions: Fall Precaution Comments: L inattention       Mobility Bed Mobility                  Transfers Overall transfer level: Needs assistance   Transfers: Sit to/from Stand;Stand Pivot Transfers Sit to Stand: Min assist Stand pivot transfers: Min assist       General transfer comment: vc for positioning and to use L hand to assist with transfers; tactile cues for trunk control to facilitate appropriate weight shifts and control speed of descent    Balance Overall balance assessment: Needs assistance   Sitting balance-Leahy Scale: Fair Sitting balance - Comments: postural control affected by inattention     Standing balance-Leahy Scale: Fair Standing balance comment: balance affected by inattention. Requires cues for positionoing to maintain balance when distracted                   ADL Overall ADL's : Needs assistance/impaired         Upper Body Bathing: Supervision/ safety;Set up;Sitting           Lower Body Dressing: Moderate assistance;Sit to/from stand   Toilet Transfer: Minimal assistance;Ambulation           Functional mobility  during ADLs: Minimal assistance;Rolling walker General ADL Comments: On entrance to room, pt ambulating with fmaily @ RW level. Explained to family need to have staff assist pt with all aspects of mobility to reduce risk of falls. Educated family on L inattention and attnetional deficits caused by CVA. Discussed need to decrease noise and distraction so that pt can work on getting better and the importance of rest peiods during the day so that her brain can "heal". Family verbalized understanding.       Vision                     Perception     Praxis      Cognition   Behavior During Therapy: Flat affect Overall Cognitive Status: Impaired/Different from baseline Area of Impairment: Attention;Following commands;Safety/judgement;Awareness;Problem solving   Current Attention Level: Selective Memory: Decreased recall of precautions;Decreased short-term memory  Following Commands: Follows one step commands consistently Safety/Judgement: Decreased awareness of safety;Decreased awareness of deficits Awareness: Emergent Problem Solving: Slow processing;Difficulty sequencing;Requires verbal cues General Comments: L inattention    Extremity/Trunk Assessment               Exercises Other Exercises Other Exercises: core strengthening ex Other Exercises: activities to increase attention to L Other Exercises: PNF patterns with LUE. Family educated   Shoulder Instructions       General Comments      Pertinent Vitals/ Pain       Pain Assessment: 0-10 Pain Score: 3  Pain Location:  back Pain Descriptors / Indicators: Aching Pain Intervention(s): Monitored during session;Repositioned  Home Living                                          Prior Functioning/Environment              Frequency Min 3X/week     Progress Toward Goals  OT Goals(current goals can now be found in the care plan section)  Progress towards OT goals: Progressing toward  goals  Acute Rehab OT Goals Patient Stated Goal: to be able to take care of myself OT Goal Formulation: With patient Time For Goal Achievement: 02/18/14 Potential to Achieve Goals: Good ADL Goals Pt Will Perform Eating: with modified independence;sitting Pt Will Perform Grooming: with modified independence;sitting Pt Will Perform Upper Body Bathing: with supervision;sitting Pt Will Perform Upper Body Dressing: with supervision;sitting Pt Will Transfer to Toilet: with supervision;bedside commode Additional ADL Goal #1: Demonstrate anticipatory awareness during ADL task with min vc for use of LUE  Plan Discharge plan remains appropriate    Co-evaluation                 End of Session     Activity Tolerance Patient tolerated treatment well   Patient Left in chair;with call bell/phone within reach;with family/visitor present   Nurse Communication Mobility status        Time: 6195-0932 OT Time Calculation (min): 24 min  Charges: OT General Charges $OT Visit: 1 Procedure OT Treatments $Self Care/Home Management : 23-37 mins  Leeyah Heather,HILLARY 02/08/2014, 11:05 AM   Maurie Boettcher, OTR/L  [phone] 02/08/2014

## 2014-02-08 NOTE — Progress Notes (Signed)
Meredith Staggers, MD Physician Signed Physical Medicine and Rehabilitation Consult Note Service date: 02/04/2014 4:04 PM  Related encounter: Admission (Discharged) from 02/02/2014 in [place]           Physical Medicine and Rehabilitation Consult   Reason for Consult: Left sided weakness, facial droop, left inattention Referring Physician:  Dr. Erlinda Hong.      HPI: [name] is a 68 y.o. female with history of GERD, HTN--no medications X 2 years; who was admitted on 02/02/14 with sudden onset of left sided weakness, facial droop as well as speech difficulty as well as HA.  BP at admission 192/104.  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 Carotid dopplers with 1-39% ICS stenosis. 2D echo with EF 65-70%, calcified MV and grade 1 diastolic dysfunction. Follow up CCT with stable hemorrhage. Therapy initiated and patient limited by left sided weak, left inattention, has difficulty following commands with decreased awareness of deficits. CIR recommended by MD and therapy team.    Review of Systems  Constitutional: Negative for chills and weight loss.  HENT: Negative for ear discharge and nosebleeds.   Respiratory: Negative for cough.   Cardiovascular: Negative for chest pain and palpitations.  Gastrointestinal: Negative for nausea and vomiting.  Neurological: Positive for dizziness, sensory change, speech change and focal weakness.       Past Medical History   Diagnosis  Date   .  GERD (gastroesophageal reflux disease)     .  Arthritis     .  Hypertension        History reviewed. No pertinent past surgical history.    Family History   Problem  Relation  Age of Onset   .  Heart disease  Mother     .  Cancer  Brother      Social History:  Married.  reports that she has quit smoking in '70's. Her smoking use included Cigarettes. She started smoking about 45  years ago. She smoked 2.00 packs per day. She does not have any smokeless tobacco history on file. She reports that she does not drink alcohol or use illicit drugs.   Allergies: No Known Allergies    No prescriptions prior to admission      Home: Home Living Family/patient expects to be discharged to:: Private residence Living Arrangements: Spouse/significant other Available Help at Discharge: Family Type of Home: House Home Access: Level entry (threshold step) Home Layout: One level Home Equipment: None  Lives With: Spouse   Functional History: Prior Function Level of Independence: Independent Comments: At [facility] when stroke occurred Functional Status:   Mobility: Bed Mobility Overal bed mobility: Needs Assistance Bed Mobility: Supine to Sit Supine to sit: Mod assist;+2 for physical assistance General bed mobility comments: Assist for initiation of movement, cues for hand placement and assit to faciliate trunk power up to upright position and provide stability. Transfers Overall transfer level: Needs assistance Equipment used: 1 person hand held assist (face to face with gait belt) Transfers: Sit to/from [employer] Transfers Sit to Stand: Mod assist Stand pivot transfers: Mod assist General transfer comment: slow processing; mod cues for L attention. able to maintain control LLE during mobility. Able to use LUE to assist with handrest on recliner   ADL: ADL Overall ADL's : Needs assistance/impaired Eating/Feeding: Set up;Sitting Grooming: Minimal assistance;Sitting Upper Body Bathing: Minimal assitance;Sitting Lower Body Bathing: Maximal assistance;Sit to/from  stand Upper Body Dressing : Moderate assistance;Sitting Lower Body Dressing: Maximal assistance;Sit to/from stand Toilet Transfer: Moderate assistance;Stand-pivot;BSC Toileting- Clothing Manipulation and Hygiene: Maximal assistance;Sit to/from stand Functional mobility during ADLs: Moderate  assistance General ADL Comments: affected by L inattention and LUE weakness   Cognition: Cognition Overall Cognitive Status: Impaired/Different from baseline Arousal/Alertness: Lethargic Orientation Level: Oriented to place;Oriented to situation;Oriented to person Attention: Focused Focused Attention: Impaired Focused Attention Impairment: Verbal basic Memory: Impaired Memory Impairment: Retrieval deficit Awareness: Impaired Awareness Impairment: Intellectual impairment Problem Solving: Impaired Problem Solving Impairment: Verbal basic;Functional basic Safety/Judgment: Impaired Cognition Arousal/Alertness: Lethargic Behavior During Therapy: Flat affect Overall Cognitive Status: Impaired/Different from baseline Area of Impairment: Attention;Following commands;Safety/judgement;Awareness;Problem solving Current Attention Level: Sustained Following Commands: Follows one step commands consistently Safety/Judgement: Decreased awareness of deficits Awareness: Emergent Problem Solving: Slow processing;Requires verbal cues;Requires tactile cues;Difficulty sequencing General Comments: L inattention   Blood pressure 129/63, pulse 58, temperature 98.2 F (36.8 C), temperature source Oral, resp. rate 20, height 5\' 4"  (1.626 m), weight 101.7 kg (224 lb 3.3 oz), SpO2 99.00%. Physical Exam  Constitutional: She appears well-developed and well-nourished.  Obese, sitting in chair eating   HENT:   Head: Normocephalic and atraumatic.  Right Ear: External ear normal.  Left Ear: External ear normal.  Eyes: Conjunctivae are normal. Pupils are equal, round, and reactive to light.  Neck: No JVD present. No tracheal deviation present. No thyromegaly present.  Cardiovascular: Normal rate.   Respiratory: Effort normal.  GI: She exhibits no distension.  Musculoskeletal:  Bilateral UE edema, left more than right  Lymphadenopathy:    She has no cervical adenopathy.  Neurological:  Left tongue  deviation, dysarthric but intelligible. LUE: 1-2 deltoid, bicep, tricep, wrist 2+, HI 2+.  LLE: 2/5 HF, KE and ankle.  Sensation 1/2 Left upper and lower---does sense gross touch.   Displays reasonable insight and awareness  Psychiatric: She has a normal mood and affect. Her behavior is normal.     Results for orders placed during the hospital encounter of 02/02/14 (from the past 24 hour(s))   BASIC METABOLIC PANEL     Status: Abnormal     Collection Time      02/04/14  3:28 AM       Result  Value  Ref Range     Sodium  140   137 - 147 mEq/L     Potassium  3.9   3.7 - 5.3 mEq/L     Chloride  105   96 - 112 mEq/L     CO2  23   19 - 32 mEq/L     Glucose, Bld  122 (*)  70 - 99 mg/dL     BUN  16   6 - 23 mg/dL     Creatinine, Ser  1.21 (*)  0.50 - 1.10 mg/dL     Calcium  8.9   8.4 - 10.5 mg/dL     GFR calc non Af Amer  45 (*)  >90 mL/min     GFR calc Af Amer  52 (*)  >90 mL/min     Anion gap  12   5 - 15   CBC     Status: None     Collection Time      02/04/14  3:28 AM       Result  Value  Ref Range     WBC  10.1   4.0 - 10.5 K/uL     RBC  4.42   3.87 - 5.11 MIL/uL  Hemoglobin  12.3   12.0 - 15.0 g/dL     HCT  38.2   36.0 - 46.0 %     MCV  86.4   78.0 - 100.0 fL     MCH  27.8   26.0 - 34.0 pg     MCHC  32.2   30.0 - 36.0 g/dL     RDW  14.0   11.5 - 15.5 %     Platelets  241   150 - 400 K/uL    Ct Head Wo Contrast   02/03/2014   CLINICAL DATA:  Intracranial hemorrhage  EXAM: CT HEAD WITHOUT CONTRAST  TECHNIQUE: Contiguous axial images were obtained from the base of the skull through the vertex without intravenous contrast.  COMPARISON:  02/02/2014  FINDINGS: Right thalamic hemorrhage unchanged measuring approximately 25 x 20 mm. There is extension into the right lateral ventricle. Moderate blood in the right lateral ventricle is unchanged. No hydrocephalus. No new hemorrhage  Ventricle size is normal.  No acute ischemic infarct or mass lesion.  IMPRESSION: Right thalamic  hemorrhage with ventricular extension unchanged from yesterday.   Electronically Signed   By: Franchot Gallo M.D.   On: 02/03/2014 09:42    Ct Head (brain) Wo Contrast   02/03/2014   CLINICAL DATA:  Left-sided weakness.  EXAM: CT HEAD WITHOUT CONTRAST  TECHNIQUE: Contiguous axial images were obtained from the base of the skull through the vertex without intravenous contrast.  COMPARISON:  None.  FINDINGS: There is a hematoma in the right thymic area measuring a maximum of 2.6 x 1.9 cm. There is mass effect on the third ventricle and there is blood in the right lateral ventricle. No ventricular dilatation. No sub fluid collection. There are benign-appearing basal ganglia calcifications. Periventricular white matter disease is noted. The brainstem and cerebellum are grossly normal. The CSF spaces around the brainstem are maintained.  The bony structures are unremarkable. The paranasal sinuses and mastoid air cells are clear. The globes are intact.  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 This also blood in the right lateral ventricle.   Electronically Signed   By: Kalman Jewels M.D.   On: 02/03/2014 00:15     Assessment/Plan: Diagnosis: right thalamic hemorrhage Does the need for close, 24 hr/day medical supervision in concert with the patient's rehab needs make it unreasonable for this patient to be served in a less intensive setting? Yes Co-Morbidities requiring supervision/potential complications: htn, bilateral knee pain, obesity Due to bladder management, bowel management, safety, skin/wound care, disease management, medication administration, pain management and patient education, does the patient require 24 hr/day rehab nursing? Yes Does the patient require coordinated care of a physician, rehab nurse, PT (1-2 hrs/day, 5 days/week), OT (1-2 hrs/day, 5 days/week) and SLP (1-2 hrs/day, 5 days/week) to address physical and functional deficits in the context of the  above medical diagnosis(es)? Yes Addressing deficits in the following areas: balance, endurance, locomotion, strength, transferring, bowel/bladder control, bathing, dressing, feeding, grooming, toileting, cognition, speech and psychosocial support Can the patient actively participate in an intensive therapy program of at least 3 hrs of therapy per day at least 5 days per week? Yes The potential for patient to make measurable gains while on inpatient rehab is excellent Anticipated functional outcomes upon discharge from inpatient rehab are supervision with PT, supervision with OT, modified independent with SLP. Estimated rehab length of stay to reach the above functional goals is: 13-17 days Does the patient have adequate social supports  to accommodate these discharge functional goals? Yes Anticipated D/C setting: Home Anticipated post D/C treatments: HH therapy and Outpatient therapy Overall Rehab/Functional Prognosis: excellent   RECOMMENDATIONS: This patient's condition is appropriate for continued rehabilitative care in the following setting: CIR Patient has agreed to participate in recommended program. Yes Note that insurance prior authorization may be required for reimbursement for recommended care.   Comment: Rehab Admissions Coordinator to follow up.   Thanks,   Meredith Staggers, MD, Mellody Drown         02/04/2014    Revision History...     Date/Time User Action   02/04/2014 4:38 PM Meredith Staggers, MD Sign   02/04/2014 4:28 PM Bary Leriche, PA-C Share  View Details Report   Routing History...     Date/Time From To Method   02/04/2014 4:38 PM Meredith Staggers, MD Meredith Staggers, MD In Basket   02/04/2014 4:38 PM Meredith Staggers, MD Wilber Oliphant, MD In [place]
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
## 2014-02-08 NOTE — PMR Pre-admission (Signed)
PMR Admission Coordinator Pre-Admission Assessment  Patient: [name] is an 68 y.o., female MRN: [id number] DOB: [birth year] Height: 5' 4" (162.6 cm) Weight: 101.878 kg (224 lb 9.6 oz)              Insurance Information HMO: yes    PPO:      PCP:      IPA:      80/20:      OTHER:  [employer] Medicare HMO      Policy#: [id number]      Subscriber: self CM Name: Clayborne Artist, RN      Phone#: [phone]     Fax#: [phone] Follow up will be with onsite reviewer Shae. Pre-Cert#: [id number]      Employer: retired Benefits:  Phone #: [phone]     Name: Tommas Olp. Date: 06-17-12     Deduct: none      Out of Pocket Max: $4900 (met $45.00)      Life Max: unlimited CIR: $245/day for days 1-7      SNF: $0/day for days 1-20; $156/copay per day for days 21-100 (100 days visit) Outpatient: 100%     Co-Pay: $10 copay Home Health: 100%      Co-Pay: no copay, visit limit based on medical necessity DME: 80%     Co-Pay: 20% Providers: in network  Family would like assistance in starting the Medicaid application. Acute care case manager aware and states that she has contacted Development worker, community.  Emergency Contact Information Contact Information   Name Relation Home Work Mobile   Stolp,James  [phone]  [place] Daughter [phone]  [phone]   Deeann Saint Daughter   [phone]   Pless,Betty Daughter   [phone]     Current Medical History  Patient Admitting Diagnosis: right thalamic hemorrhage  History of Present Illness: [name] is a 68 y.o. female with history of GERD, HTN--no medications X 2 years; who was admitted on 02/02/14 with sudden onset of left sided weakness, facial droop as well as speech difficulty as well as HA.  BP at admission 192/104.  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 Carotid dopplers with 1-39% ICS stenosis. 2D echo with EF  65-70%, calcified MV and grade 1 diastolic dysfunction. Follow up CCT with stable hemorrhage. Therapy initiated and patient limited by left sided weak, left inattention, has difficulty following commands with decreased awareness of deficits. CIR recommended by MD and therapy team.   NIH Total: 4  Past Medical History  Past Medical History  Diagnosis Date  . GERD (gastroesophageal reflux disease)   . Arthritis   . Hypertension     Family History  family history includes Cancer in her brother; Heart disease in her mother.  Prior Rehab/Hospitalizations: none   Current Medications  Current facility-administered medications: stroke: mapping our early stages of recovery book, , Does not apply, Once, Roland Rack, MD;  acetaminophen (TYLENOL) tablet 650 mg, 650 mg, Oral, Q4H PRN, Roland Rack, MD, 650 mg at 02/08/14 0311;  amLODipine (NORVASC) tablet 10 mg, 10 mg, Oral, Daily, Rosalin Hawking, MD, 10 mg at 02/08/14 1050;  atorvastatin (LIPITOR) tablet 20 mg, 20 mg, Oral, q1800, Rosalin Hawking, MD, 20 mg at 02/07/14 1701 heparin injection 5,000 Units, 5,000 Units, Subcutaneous, 3 times per day, Rosalin Hawking, MD, 5,000 Units at 02/07/14 2200;  labetalol (NORMODYNE,TRANDATE) injection 10-20 mg, 10-20 mg, Intravenous, Q2H PRN, Rosalin Hawking, MD, 10  mg at 02/05/14 0446;  metoprolol tartrate (LOPRESSOR) tablet 75 mg, 75 mg, Oral, BID, Rosalin Hawking, MD, 75 mg at 02/08/14 1050;  pantoprazole (PROTONIX) EC tablet 40 mg, 40 mg, Oral, Daily, Rosalin Hawking, MD, 40 mg at 02/08/14 1050 senna-docusate (Senokot-S) tablet 1 tablet, 1 tablet, Oral, BID, Roland Rack, MD, 1 tablet at 02/08/14 1050  Patients Current Diet: Cardiac  Precautions / Restrictions Precautions Precautions: Fall Precaution Comments: L inattention   Prior Activity Level Community (5-7x/wk): Pt was independent prior to admit, enjoys shopping and her flowers.  Home Assistive Devices / Equipment Home Assistive Devices/Equipment:  None Home Equipment: None  Prior Functional Level Prior Function Level of Independence: Independent Comments: At [facility] when stroke occurred  Current Functional Level Cognition  Arousal/Alertness: Lethargic Overall Cognitive Status: Impaired/Different from baseline Current Attention Level: Selective Orientation Level: Oriented to person;Oriented to place;Oriented to situation;Disoriented to time Following Commands: Follows one step commands consistently Safety/Judgement: Decreased awareness of safety;Decreased awareness of deficits General Comments: L inattention Attention: Focused Focused Attention: Impaired Focused Attention Impairment: Verbal basic Memory: Impaired Memory Impairment: Retrieval deficit Awareness: Impaired Awareness Impairment: Intellectual impairment Problem Solving: Impaired Problem Solving Impairment: Verbal basic;Functional basic Safety/Judgment: Impaired    Extremity Assessment (includes Sensation/Coordination)          ADLs  Overall ADL's : Needs assistance/impaired Eating/Feeding: Set up;Sitting Grooming: Minimal assistance;Sitting Upper Body Bathing: Supervision/ safety;Set up;Sitting Lower Body Bathing: Maximal assistance;Sit to/from stand Upper Body Dressing : Moderate assistance;Sitting Lower Body Dressing: Moderate assistance;Sit to/from stand Toilet Transfer: Minimal assistance;Ambulation Toileting- Clothing Manipulation and Hygiene: Maximal assistance;Sit to/from stand Functional mobility during ADLs: Minimal assistance;Rolling walker General ADL Comments: On entrance to room, pt ambulating with fmaily @ RW level. Explained to family need to have staff assist pt with all aspects of mobility to reduce risk of falls. Educated family on L inattention and attnetional deficits caused by CVA. Discussed need to decrease noise and distraction so that pt can work on getting better and the importance of rest peiods during the day so that her brain can  "heal". Family verbalized understanding.     Mobility  Overal bed mobility: Needs Assistance Bed Mobility: Rolling;Sidelying to Sit Rolling: Min guard Sidelying to sit: Min assist Supine to sit: Mod assist;+2 for physical assistance General bed mobility comments: cues for sequence with increased time pt able to use rail to roll and min assist to fully elevate trunk from surface    Transfers  Overall transfer level: Needs assistance Equipment used: 1 person hand held assist (face to face with gait belt) Transfers: Sit to/from Stand;Stand Pivot Transfers Sit to Stand: Min assist Stand pivot transfers: Min assist General transfer comment: vc for positioning and to use L hand to assist with transfers; tactile cues for trunk control to facilitate appropriate weight shifts and control speed of descent    Ambulation / Gait / Stairs / Wheelchair Mobility  Ambulation/Gait Ambulation/Gait assistance: Min assist;+2 safety/equipment (dgtr followed with chair) Ambulation Distance (Feet): 50 Feet (15' then 47' after seated rest) Assistive device: Rolling walker (2 wheeled) Gait Pattern/deviations: Shuffle;Decreased stride length Gait velocity interpretation: Below normal speed for age/gender General Gait Details: pt with min assist to maintain grip of LUE on RW, cues to step into RW and midline right to left pt with tendency to pull Rw right. May perform better with hemiwalker. Max cues to scan environment and attend to left    Posture / Balance Dynamic Sitting Balance Sitting balance - Comments: postural control affected by inattention  Special needs/care consideration BiPAP/CPAP - pt was fitted for CPAP but never got the unit due to financial limitations CPM no  Continuous Drip IV no  Dialysis no         Life Vest no  Oxygen no  Special Bed no  Trach Size no  Wound Vac (area) no       Skin - no current issues                               Bowel mgmt: last BM on 02-07-14 Bladder mgmt:  currently using bedside commode or commode with assist Diabetic mgmt no  Note: pt prefers female caregivers/staff. I made charge nurse on rehab unit aware.   Previous Home Environment Living Arrangements: Spouse/significant other  Lives With: Spouse Available Help at Discharge: Family Type of Home: House Home Layout: One level Home Access: Level entry (threshold step) Bathroom Shower/Tub: Chiropodist: [place]: No  Discharge Living Setting Plans for Discharge Living Setting: Patient's home Type of Home at Discharge: House Discharge Home Layout: One level Discharge Home Access: Stairs to enter Entrance Stairs-Rails: None Entrance Stairs-Number of Steps: 2 Does the patient have any problems obtaining your medications?: No  Social/Family/Support Systems Patient Roles:  (involved with her family) Contact Information: husband and three dtrs (three dtrs are in communication with their father) Anticipated Caregiver: husband and 3 dtrs Anticipated Caregiver's Contact Information: see above Ability/Limitations of Caregiver: none Caregiver Availability: 24/7 Discharge Plan Discussed with Primary Caregiver: Yes Is Caregiver In Agreement with Plan?: Yes Does Caregiver/Family have Issues with Lodging/Transportation while Pt is in Rehab?: No  Goals/Additional Needs Patient/Family Goal for Rehab: Supervision with PT/OT and Mod Ind with SLP Expected length of stay: 13-17 days Cultural Considerations: none Dietary Needs: heart healthy Equipment Needs: to be determined Pt/Family Agrees to Admission and willing to participate: Yes (spoke with 2 daughters and several other family in room) Program Orientation Provided & Reviewed with Pt/Caregiver Including Roles  & Responsibilities: Yes   Decrease burden of Care through IP rehab admission: NA  Possible need for SNF placement upon discharge: not anticipated  Patient Condition: This patient's medical  and functional status has changed since the consult dated: 02-04-14 in which the Rehabilitation Physician determined and documented that the patient's condition is appropriate for intensive rehabilitative care in an inpatient rehabilitation facility. See "History of Present Illness" (above) for medical update. Functional changes are: minimal to moderate assist with mobility, ambulating 50' intervals. Patient's medical and functional status update has been discussed with the Rehabilitation physician and patient remains appropriate for inpatient rehabilitation. Will admit to inpatient rehab today.  Preadmission Screen Completed By:  Nanetta Batty, PT, 02/08/2014 1357 AM ______________________________________________________________________   Discussed status with Dr. Naaman Plummer on 02/08/14 at 1357 and received telephone approval for admission today.  Admission Coordinator: Nanetta Batty, PT, time 1357/Date 02/08/14

## 2014-02-08 NOTE — Progress Notes (Addendum)
Rehab admissions - We did receive insurance authorization for inpatient rehab from [facility]. I spoke with neuro NP Ivin Booty and she stated pt is medically stable for inpatient rehab. Bed is available and will admit to inpatient rehab later today.  I updated pt and her family. Three daughters and other family were present and I completed admission paperwork with pt's daughters. All were pleased with the plan for inpatient rehab. Daughters stated that they would update their father on this plan.   I updated pt's RN, Hassan Rowan with case management and [place] with social work. Please call me with any questions. Thanks.   Nanetta Batty, PT Rehabilitation Admissions Coordinator [phone]

## 2014-02-08 NOTE — Progress Notes (Signed)
STROKE TEAM PROGRESS NOTE   HISTORY [name] is a 68 y.o. female with a history of hypertension who is not currently taking any treatment who presents with sudden onset left-sided weakness while in church earlier today 02/02/2014 at 0930 pm.. She also complains of headache. She is been told that she has hypertension the past, but did not like taking medications and therefore has not been under treatment for it for the past 2 years. Patient was not administered TPA secondary to hemorrhage. She was admitted to the neuro ICU for further evaluation and treatment.   SUBJECTIVE (INTERVAL HISTORY) She is doing well without any neurological changes. Her blood pressures were well controlled. She has been accepted for inpatient rehabilitation but we are awaiting insurance approval.  OBJECTIVE Temp:  [98.3 F (36.8 C)-98.9 F (37.2 C)] 98.9 F (37.2 C) (08/25 0906) Pulse Rate:  [52-75] 75 (08/25 0906) Resp:  [16-18] 18 (08/25 0906) BP: (126-165)/(64-99) 149/88 mmHg (08/25 0906) SpO2:  [96 %-100 %] 99 % (08/25 0906) Weight:  [101.878 kg (224 lb 9.6 oz)] 101.878 kg (224 lb 9.6 oz) (08/25 0400)  Ct Head Wo Contrast 02/03/2014 9:35am  IMPRESSION: Right thalamic hemorrhage with ventricular extension unchanged from yesterday.     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 This also blood in the right lateral ventricle.     CUS - 1-39% internal carotid artery stenosis bilaterally. Vertebral arteries are patent with antegrade flow.   Echo - - Left ventricle: The cavity size was normal. Wall thickness was normal. Systolic function was vigorous. The estimated ejection fraction was in the range of 65% to 70%. Doppler parameters are consistent with abnormal left ventricular relaxation (grade 1 diastolic dysfunction). - Mitral valve: Calcified annulus. Mildly thickened leaflets .  BMET    Component Value Date/Time   NA 140 02/08/2014 0546   K 4.2  02/08/2014 0546   CL 102 02/08/2014 0546   CO2 23 02/08/2014 0546   GLUCOSE 111* 02/08/2014 0546   BUN 29* 02/08/2014 0546   CREATININE 1.47* 02/08/2014 0546   CREATININE 1.27* 04/08/2012 1109   CALCIUM 9.9 02/08/2014 0546   GFRNONAA 36* 02/08/2014 0546   GFRNONAA 50* 03/04/2012 1644   GFRAA 41* 02/08/2014 0546   GFRAA 58* 03/04/2012 1644   CBC    Component Value Date/Time   WBC 8.2 02/08/2014 0546   RBC 4.72 02/08/2014 0546   HGB 13.1 02/08/2014 0546   HCT 41.0 02/08/2014 0546   PLT 283 02/08/2014 0546   MCV 86.9 02/08/2014 0546   MCH 27.8 02/08/2014 0546   MCHC 32.0 02/08/2014 0546   RDW 13.7 02/08/2014 0546   LYMPHSABS 1.8 02/02/2014 2340   MONOABS 0.6 02/02/2014 2340   EOSABS 0.1 02/02/2014 2340   BASOSABS 0.1 02/02/2014 2340   Lipid Panel     Component Value Date/Time   CHOL 195 02/03/2014 1050   TRIG 87 02/03/2014 1050   HDL 42 02/03/2014 1050   CHOLHDL 4.6 02/03/2014 1050   VLDL 17 02/03/2014 1050   LDLCALC 136* 02/03/2014 1050   A1C 5.8  PHYSICAL EXAM  Temp:  [98.3 F (36.8 C)-98.9 F (37.2 C)] 98.9 F (37.2 C) (08/25 0906) Pulse Rate:  [52-75] 75 (08/25 0906) Resp:  [16-18] 18 (08/25 0906) BP: (126-165)/(64-99) 149/88 mmHg (08/25 0906) SpO2:  [96 %-100 %] 99 % (08/25 0906) Weight:  [101.878 kg (224 lb 9.6 oz)] 101.878 kg (224 lb 9.6 oz) (08/25 0400)  General - obese middle  aged african american lady., in no apparent distress.  Ophthalmologic - not able to see through.  Cardiovascular - Regular rate and rhythm with no murmur.  Mental Status -  Level of arousal and orientation to time, place, and person were intact. Language including expression, naming, repetition, comprehension was assessed and found intact. Fund of Knowledge was assessed and was intact.  Cranial Nerves II - XII - II - Visual field intact OU. III, IV, VI - Extraocular movements intact. V - Facial sensation intact bilaterally. VII - Facial movement intact bilaterally. VIII - Hearing & vestibular intact  bilaterally. X - Palate elevates symmetrically. XI - Chin turning & shoulder shrug intact bilaterally. XII - Tongue protrusion intact.  Motor Strength - The patient's strength was 4-/5 LUE proximal, 5-/5 distal, 4+/5 LLE proximal and 5/5 distally. positive pronator drift on the left. Bulk was normal and fasciculations were absent.   Motor Tone - Muscle tone was assessed at the neck and appendages and was normal.  Reflexes - The patient's reflexes were normal in all extremities and she had no pathological reflexes.  Sensory - Light touch, temperature/pinprick were assessed and were normal.    Coordination - The patient had normal movements in the hands and feet with no ataxia or dysmetria.  Tremor was absent.  Gait and Station - not tested due to BP concern.   ASSESSMENT/PLAN  [name] is a 68 y.o. female with hx of hypertension not taking medications presenting with left sided weakness. 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 Volume: 15 cc. Placed on cardene drip.  . Repeat CT i showed stable hematoma and ventricular extension.  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  2D Echo unremarkable  Carotid unremarkable   SCDs and heparin subq for VTE prophylaxis.   Cardiac thin liquids   Therapy needs: CIR - admission awaited  Risk factor management/education  Dispo:  CIR, await insurance approval  Malignant Hypertension   BP 218/107 on admission  Still on cardene drip this am  Home meds:  Metoprolol 12.5, hygroton 12.5.   increase metoprolol to 75mg  and continue amlodipine 10mg   SBP goal < 160  Patient counseled to be compliant with blood pressure medications  Hyperlipidemia Hx  LDL 136, start lipitor  20mg  daily today.  Other Stroke Risk Factors   Obesity, Body mass index is 38.53 kg/(m^2).    Family hx stroke (brother with [place])  Hospital day # 6  Antony Contras, MD   To contact Stroke Continuity provider, please refer to [email or web address] After hours, contact General Neurology

## 2014-02-09 ENCOUNTER — Inpatient Hospital Stay (HOSPITAL_COMMUNITY): Payer: Medicare HMO | Admitting: Physical Therapy

## 2014-02-09 ENCOUNTER — Inpatient Hospital Stay (HOSPITAL_COMMUNITY): Payer: Medicare HMO | Admitting: Occupational Therapy

## 2014-02-09 ENCOUNTER — Inpatient Hospital Stay (HOSPITAL_COMMUNITY): Payer: Medicare HMO | Admitting: Speech Pathology

## 2014-02-09 DIAGNOSIS — Z5189 Encounter for other specified aftercare: Secondary | ICD-10-CM

## 2014-02-09 DIAGNOSIS — I619 Nontraumatic intracerebral hemorrhage, unspecified: Secondary | ICD-10-CM

## 2014-02-09 LAB — COMPREHENSIVE METABOLIC PANEL
ALK PHOS: 70 U/L (ref 39–117)
ALT: 41 U/L — ABNORMAL HIGH (ref 0–35)
ANION GAP: 13 (ref 5–15)
AST: 24 U/L (ref 0–37)
Albumin: 3.6 g/dL (ref 3.5–5.2)
BILIRUBIN TOTAL: 0.4 mg/dL (ref 0.3–1.2)
BUN: 29 mg/dL — AB (ref 6–23)
CHLORIDE: 101 meq/L (ref 96–112)
CO2: 24 meq/L (ref 19–32)
Calcium: 9.4 mg/dL (ref 8.4–10.5)
Creatinine, Ser: 1.34 mg/dL — ABNORMAL HIGH (ref 0.50–1.10)
GFR, EST AFRICAN AMERICAN: 46 mL/min — AB (ref >90)
GFR, EST NON AFRICAN AMERICAN: 40 mL/min — AB (ref >90)
Glucose, Bld: 105 mg/dL — ABNORMAL HIGH (ref 70–99)
POTASSIUM: 4.3 meq/L (ref 3.7–5.3)
Sodium: 138 mEq/L (ref 137–147)
Total Protein: 7.9 g/dL (ref 6.0–8.3)

## 2014-02-09 LAB — CBC WITH DIFFERENTIAL/PLATELET
Basophils Absolute: 0.1 10*3/uL (ref 0.0–0.1)
Basophils Relative: 2 % — ABNORMAL HIGH (ref 0–1)
Eosinophils Absolute: 0.2 10*3/uL (ref 0.0–0.7)
Eosinophils Relative: 3 % (ref 0–5)
HEMATOCRIT: 40.9 % (ref 36.0–46.0)
HEMOGLOBIN: 13.2 g/dL (ref 12.0–15.0)
LYMPHS ABS: 2.1 10*3/uL (ref 0.7–4.0)
Lymphocytes Relative: 34 % (ref 12–46)
MCH: 27.6 pg (ref 26.0–34.0)
MCHC: 32.3 g/dL (ref 30.0–36.0)
MCV: 85.4 fL (ref 78.0–100.0)
MONOS PCT: 10 % (ref 3–12)
Monocytes Absolute: 0.6 10*3/uL (ref 0.1–1.0)
NEUTROS PCT: 51 % (ref 43–77)
Neutro Abs: 3.2 10*3/uL (ref 1.7–7.7)
Platelets: 252 10*3/uL (ref 150–400)
RBC: 4.79 MIL/uL (ref 3.87–5.11)
RDW: 13.6 % (ref 11.5–15.5)
WBC: 6.3 10*3/uL (ref 4.0–10.5)
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
## 2014-02-09 NOTE — Evaluation (Signed)
Physical Therapy Assessment and Plan  Patient Details  Name: [name] MRN: [id number] Date of Birth: [birth year]  PT Diagnosis: Abnormal posture, Abnormality of gait, Cognitive deficits, Hemiplegia non-dominant, Impaired sensation and Muscle weakness Rehab Potential: Good ELOS: 7 days   Today's Date: 02/09/2014 PT Individual Time: 1410-1510 PT Individual Time Calculation (min): 60 min    Problem List:  Patient Active Problem List   Diagnosis Date Noted  . 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 ICH (intracerebral hemorrhage) 02/03/2014  . Intracranial bleed 02/03/2014  . Shortness of breath 03/04/2012  . Bilateral lower extremity edema 03/04/2012  . Knee pain, bilateral 03/04/2012  . Cardiomegaly 03/04/2012  . Obesity (BMI 30-39.9) 03/04/2012  . Fatigue 03/04/2012  . Chronic kidney disease unknown stage 03/04/2012  . Dizziness 03/04/2012  . Sleep disturbance 03/04/2012  . Hemorrhoids 07/03/2006  . TOBACCO ABUSE, HX OF 07/03/2006  . HYPERTENSION 04/30/2006  . GERD 04/30/2006  . LOW BACK PAIN 04/30/2006  . Hx of alcohol use 04/30/2006    Past Medical History:  Past Medical History  Diagnosis Date  . GERD (gastroesophageal reflux disease)   . Arthritis   . Hypertension   . Shortness of breath   . Sleep apnea    Past Surgical History: No past surgical history on file.  Assessment & Plan Clinical Impression: [name] is a 68 y.o. right-handed female with history of GERD, HTN--no medications X 2 years; who was admitted on 02/02/14 with sudden onset of left sided weakness, facial droop as well as speech difficulty as well as HA. BP at admission 192/104. 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 Patient did not receive TPA. Carotid dopplers with 1-39% ICS stenosis. 2D echo with EF 65-70%, calcified MV and grade 1 diastolic dysfunction. Follow up CCT with stable hemorrhage. Therapy  initiated and patient limited by left sided weak, left inattention, has difficulty following commands with decreased awareness of deficits. Subcutaneous heparin added for DVT prophylaxis 02/04/2014. Maintained on a regular consistency diet. Patient transferred to CIR on 02/08/2014 .   Patient currently requires min with mobility secondary to muscle weakness, decreased cardiorespiratoy endurance and impaired timing and sequencing, abnormal tone, unbalanced muscle activation, decreased coordination and decreased motor planning.  Prior to hospitalization, patient was independent  with mobility and lived with Spouse in a House home.  Home access is 2Stairs to enter.  Patient will benefit from skilled PT intervention to maximize safe functional mobility, minimize fall risk and decrease caregiver burden for planned discharge home with 24 hour supervision.  Anticipate patient will benefit from follow up [place] at discharge.  PT - End of Session Activity Tolerance: Decreased this session;Tolerates 30+ min activity with multiple rests Endurance Deficit: Yes PT Assessment Rehab Potential: Good Barriers to Discharge: Inaccessible home environment PT Patient demonstrates impairments in the following area(s): Balance;Behavior;Endurance;Motor;Perception;Safety;Sensory PT Transfers Functional Problem(s): Bed Mobility;Bed to Chair;Car;Furniture PT Locomotion Functional Problem(s): Ambulation;Wheelchair Mobility;Stairs PT Plan PT Intensity: Minimum of 1-2 x/day ,45 to 90 minutes PT Frequency: 5 out of 7 days PT Duration Estimated Length of Stay: 7 days PT Treatment/Interventions: Ambulation/gait training;Balance/vestibular training;Discharge planning;DME/adaptive equipment instruction;Functional mobility training;Neuromuscular re-education;Patient/family education;Stair training;Therapeutic Activities;Therapeutic Exercise;UE/LE Strength taining/ROM;UE/LE Coordination activities;Wheelchair propulsion/positioning;Pain  management;Disease management/prevention PT Transfers Anticipated Outcome(s): supervision PT Locomotion Anticipated Outcome(s): supervision PT Recommendation Follow Up Recommendations: Home health PT Patient destination: Home Equipment Recommended: To be determined  Skilled Therapeutic Intervention Skilled therapeutic intervention initiated after completion of  evaluation. Discussed with patient falls risk, safety within room, and focus of therapy during stay. Discussed possible LOS, goals, and f/u therapy.  PT Evaluation Precautions/Restrictions Precautions Precautions: Fall Precaution Comments: L inattention Restrictions Weight Bearing Restrictions: No General Chart Reviewed: Yes Family/Caregiver Present: Yes Vital SignsTherapy Vitals Temp: 98.6 F (37 C) Temp src: Oral Pulse Rate: 50 Resp: 18 BP: 118/61 mmHg Patient Position (if appropriate): Lying Oxygen Therapy SpO2: 98 % O2 Device: None (Room air) Pain Pain Assessment Pain Assessment: No/denies pain Home Living/Prior Functioning Home Living Living Arrangements: Spouse/significant other Available Help at Discharge: Family Type of Home: House Home Access: Stairs to enter Technical brewer of Steps: 2 Home Layout: One level  Lives With: Spouse Prior Function Level of Independence: Independent with transfers;Independent with gait;Independent with basic ADLs  Able to Take Stairs?: Yes Driving: Yes Vocation: Retired Leisure: Hobbies-yes (Comment) Comments: fishing, gardening, going to church  Vision/Perception   Defer to OT evaluation  Cognition Overall Cognitive Status: Impaired/Different from baseline Arousal/Alertness: Lethargic Orientation Level: Oriented to person;Oriented to place;Oriented to situation;Disoriented to time (generally) Attention: Sustained Sustained Attention: Impaired Sustained Attention Impairment: Verbal basic;Functional basic Memory: Impaired Awareness: Impaired Awareness  Impairment: Intellectual impairment Problem Solving: Impaired Problem Solving Impairment: Verbal basic;Functional basic Executive Function: Reasoning;Decision Making;Initiating;Self Monitoring;Self Correcting Reasoning: Impaired Reasoning Impairment: Functional basic;Verbal basic Decision Making: Impaired Decision Making Impairment: Verbal basic;Functional basic Initiating: Impaired Initiating Impairment: Verbal basic;Functional basic Self Monitoring: Impaired Self Monitoring Impairment: Functional basic;Verbal basic Self Correcting: Impaired Self Correcting Impairment: Verbal basic;Functional basic Safety/Judgment: Impaired Sensation Sensation Light Touch: Impaired Detail Light Touch Impaired Details: Impaired LLE;Impaired LUE Stereognosis: Not tested Hot/Cold: Not tested Proprioception: Not tested Coordination Gross Motor Movements are Fluid and Coordinated: No Fine Motor Movements are Fluid and Coordinated: No Coordination and Movement Description: decreased coordination LUE > LLE Heel Shin Test: impaired LLE Motor  Motor Motor: Hemiplegia;Abnormal postural alignment and control Motor - Skilled Clinical Observations: L hemiplegia UE > LE, impairments due to L inattention  Mobility Bed Mobility Bed Mobility: Supine to Sit;Sit to Supine Supine to Sit: 5: Supervision Sit to Supine: 5: Supervision Transfers Transfers: Yes Stand Pivot Transfers: 4: Min assist;With armrests Stand Pivot Transfer Details (indicate cue type and reason): sit <> stand with supervision, min assist to facilitate weightshifting for transfer, verbal cues for hand placement Locomotion  Ambulation Ambulation: Yes Ambulation/Gait Assistance: 4: Min assist;4: Min guard Ambulation Distance (Feet): 100 Feet Assistive device: Rolling walker Gait Gait: Yes Gait Pattern: Impaired Gait Pattern: Step-to pattern;Step-through pattern;Decreased step length - left;Decreased stride length;Shuffle;Wide base of  support;Trunk flexed;Decreased trunk rotation Gait velocity: decreased Stairs / Additional Locomotion Stairs: Yes Stairs Assistance: 4: Min assist;4: Min guard Stairs Assistance Details: Verbal cues for technique;Verbal cues for sequencing Stair Management Technique: Two rails;Forwards Number of Stairs: 5 Height of Stairs: 6 Wheelchair Mobility Wheelchair Mobility: Yes Wheelchair Assistance: 2: Max Technical sales engineer Details: Verbal cues for sequencing;Tactile cues for sequencing;Verbal cues for precautions/safety Wheelchair Propulsion: Both upper extremities Wheelchair Parts Management: Needs assistance Distance: 30 ft  Trunk/Postural Assessment  Cervical Assessment Cervical Assessment: Within Functional Limits Thoracic Assessment Thoracic Assessment: Exceptions to [facility] (kyphotic) Lumbar Assessment Lumbar Assessment: Exceptions to [facility] (decreased extensibility, history of back pain) Postural Control Postural Control: Deficits on evaluation Righting Reactions: delayed/impaired, limited by L inattention Protective Responses: delayed/impaired, limited by L inattention  Balance Balance Balance Assessed: Yes Static Standing Balance Static Standing - Balance Support: During functional activity Static Standing - Level of Assistance: 5: Stand by assistance Dynamic Standing Balance Dynamic  Standing - Balance Support: During functional activity;Bilateral upper extremity supported Dynamic Standing - Level of Assistance: 5: Stand by assistance Extremity Assessment  RLE Assessment RLE Assessment: Within Functional Limits LLE Assessment LLE Assessment: Exceptions to [facility] LLE Strength LLE Overall Strength: Deficits LLE Overall Strength Comments: grossly 4+ to 5/5 except hip flexion 4-/5  FIM:  FIM - Bed/Chair Transfer Bed/Chair Transfer Assistive Devices: Arm rests;Walker Bed/Chair Transfer: 5: Supine > Sit: Supervision (verbal cues/safety issues);5: Sit > Supine: Supervision  (verbal cues/safety issues);4: Bed > Chair or W/C: Min A (steadying Pt. > 75%);4: Chair or W/C > Bed: Min A (steadying Pt. > 75%) FIM - Locomotion: Wheelchair Distance: 30 ft Locomotion: Wheelchair: 1: Travels less than 50 ft with maximal assistance (Pt: 25 - 49%) FIM - Locomotion: Ambulation Locomotion: Ambulation Assistive Devices: Administrator Ambulation/Gait Assistance: 4: Min assist;4: Min guard Locomotion: Ambulation: 2: Travels 50 - 149 ft with minimal assistance (Pt.>75%) FIM - Locomotion: Stairs Locomotion: Scientist, physiological: Hand rail - 2 Locomotion: Stairs: 2: Up and Down 4 - 11 stairs with minimal assistance (Pt.>75%)   Refer to Care Plan for Long Term Goals  Recommendations for other services: None  Discharge Criteria: Patient will be discharged from PT if patient refuses treatment 3 consecutive times without medical reason, if treatment goals not met, if there is a change in medical status, if patient makes no progress towards goals or if patient is discharged from hospital.  The above assessment, treatment plan, treatment alternatives and goals were discussed and mutually agreed upon: by patient and by family  Laretta Alstrom 02/09/2014, 5:00 PM

## 2014-02-09 NOTE — Progress Notes (Signed)
Social Work Assessment and Plan Social Work Assessment and Plan  Patient Details  Name: [name] MRN: [id number] Date of Birth: [birth year]  Today's Date: 02/09/2014  Problem List:  Patient Active Problem List   Diagnosis Date Noted  . 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 ICH (intracerebral hemorrhage) 02/03/2014  . Intracranial bleed 02/03/2014  . Shortness of breath 03/04/2012  . Bilateral lower extremity edema 03/04/2012  . Knee pain, bilateral 03/04/2012  . Cardiomegaly 03/04/2012  . Obesity (BMI 30-39.9) 03/04/2012  . Fatigue 03/04/2012  . Chronic kidney disease unknown stage 03/04/2012  . Dizziness 03/04/2012  . Sleep disturbance 03/04/2012  . Hemorrhoids 07/03/2006  . TOBACCO ABUSE, HX OF 07/03/2006  . HYPERTENSION 04/30/2006  . GERD 04/30/2006  . LOW BACK PAIN 04/30/2006  . Hx of alcohol use 04/30/2006   Past Medical History:  Past Medical History  Diagnosis Date  . GERD (gastroesophageal reflux disease)   . Arthritis   . Hypertension   . Shortness of breath   . Sleep apnea    Past Surgical History: No past surgical history on file. Social History:  reports that she has quit smoking. Her smoking use included Cigarettes. She started smoking about 45 years ago. She smoked 2.00 packs per day. She does not have any smokeless tobacco history on file. She reports that she does not drink alcohol or use illicit drugs.  Family / Support Systems Marital Status: Married Patient Roles: Spouse;Parent;Volunteer Spouse/Significant OtherJeneen Rinks  [phone]  [phone] Children: Mary-daughter  [phone]  [phone]  Lattie [place]-daughter  [phone]-cell Other Supports: Betty-daughter  [phone] Anticipated Caregiver: husband and daughter's Ability/Limitations of Caregiver: husband ambulates with a cane, daughter's work-husbnand can do light assist Caregiver Availability: 24/7 Family Dynamics: Close knit family who are involved and supportive.  They will do  anything for their [place] and Daddy.  Someone is laways here with pt,  this makes pt feel  better and family feels more comfortable with this  Social History Preferred language: English Religion:  Cultural Background: No issues Education: High School Read: Yes Write: Yes Employment Status: Retired Freight forwarder Issues: No issues Guardian/Conservator: None-according to MD pt is capablee of making her own decisions while here.   Abuse/Neglect Physical Abuse: Denies Verbal Abuse: Denies Sexual Abuse: Denies Exploitation of patient/patient's resources: Denies Self-Neglect: Denies  Emotional Status [name] affect, behavior adn adjustment status: Pt is motivated to improve and wants to only be here five days.  This is her goal for going home.  She has always been one who has taken care of others not the other way around.  She is learning from this and will do what her God wants her to do. Recent Psychosocial Issues: Other medical issues but has managed Pyschiatric History: No history deferred depression screen at this time due to coping appropriately at this time and she has a very strong faith to get her through difficult times.  She is very involved in her church. Substance Abuse History: No issues  Patient / Family Perceptions, Expectations & Goals Pt/Family understanding of illness & functional limitations: Pt and family can explain her storke and deficits.  They are very pleased wioth the progress they have sen since her admission and are hopeful it will continue.  They ask MD their questions and feel they are beign addressed. Premorbid pt/family roles/activities: Wife, Mother, grandmother, retiree, [place] member, etc Anticipated changes in roles/activities/participation: resume Pt/family expectations/goals: Pt states: " I want to take care of myself, my God will make this happen."  Husband states: " We are amazed how well she is doing and blessed."  Electronics engineer: None Premorbid Home Care/DME Agencies: None Transportation available at discharge: [employer] referrals recommended: Support group (specify) ([facility] SUpport group)  Discharge Planning Living Arrangements: Spouse/significant other Support Systems: Spouse/significant other;Children;Other relatives;Friends/neighbors;Church/faith community Type of Residence: Private residence Insurance Resources: Multimedia programmer (specify) ([place]) Financial Resources: Social Security;Family Support Financial Screen Referred: Yes Living Expenses: Lives with family Money Management: Spouse;Patient Does the patient have any problems obtaining your medications?: No Home Management: Both but mostly patient Patient/Family Preliminary Plans: Return home with husband who can provide superivison levle due to his own health issues.  Daughter's are willing to assist but they work.  Pt should do well here and get to superivison level, making good progress. Social Work Anticipated Follow Up Needs: HH/OP;Support Group  Clinical Impression Very pleasant couple who are willing to help each other for pt to attain her goal.  Husband and daughter's always have someone here with pt to provide emotional support. Pt doing well and should be a short length of stay.  Await team's evaluations and work on discharge needs.  Elease Hashimoto 02/09/2014, 4:08 PM

## 2014-02-09 NOTE — Progress Notes (Signed)
Patient information reviewed and entered into eRehab system by Sharetha Newson, RN, CRRN, PPS Coordinator.  Information including medical coding and functional independence measure will be reviewed and updated through discharge.     Per nursing patient was given "Data Collection Information Summary for Patients in Inpatient Rehabilitation Facilities with attached "Privacy Act Statement-Health Care Records" upon admission.  

## 2014-02-09 NOTE — Progress Notes (Signed)
Occupational Therapy Assessment and Plan  Patient Details  Name: [name] MRN: [id number] Date of Birth: March 24, 1946  OT Diagnosis: hemiplegia affecting non-dominant side and muscle weakness (generalized) Rehab Potential: Rehab Potential: Good (for stated goals) ELOS: 7 days   Today's Date: 02/09/2014 OT Individual Time:  1751- 1059 60 minutes      Problem List:  Patient Active Problem List   Diagnosis Date Noted  . 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 ICH (intracerebral hemorrhage) 02/03/2014  . Intracranial bleed 02/03/2014  . Shortness of breath 03/04/2012  . Bilateral lower extremity edema 03/04/2012  . Knee pain, bilateral 03/04/2012  . Cardiomegaly 03/04/2012  . Obesity (BMI 30-39.9) 03/04/2012  . Fatigue 03/04/2012  . Chronic kidney disease unknown stage 03/04/2012  . Dizziness 03/04/2012  . Sleep disturbance 03/04/2012  . Hemorrhoids 07/03/2006  . TOBACCO ABUSE, HX OF 07/03/2006  . HYPERTENSION 04/30/2006  . GERD 04/30/2006  . LOW BACK PAIN 04/30/2006  . Hx of alcohol use 04/30/2006    Past Medical History:  Past Medical History  Diagnosis Date  . GERD (gastroesophageal reflux disease)   . Arthritis   . Hypertension   . Shortness of breath   . Sleep apnea    Past Surgical History: No past surgical history on file.  Assessment & Plan Clinical Impression: Patient is a 68 y.o. year old ight-handed female with history of GERD, HTN--no medications X 2 years; who was admitted on 02/02/14 with sudden onset of left sided weakness, facial droop as well as speech difficulty as well as HA. BP at admission 192/104. 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 Patient did not receive TPA. Carotid dopplers with 1-39% ICS stenosis. 2D echo with EF 65-70%, calcified MV and grade 1 diastolic dysfunction. Follow up CCT with stable hemorrhage. Therapy initiated and patient limited by left  sided weak, left inattention, has difficulty following commands with decreased awareness of deficits. Patient transferred to CIR on 02/08/2014 .    Patient currently requires min with basic self-care skills secondary to muscle weakness, decreased cardiorespiratoy endurance, decreased coordination, decreased attention to left, decreased problem solving, decreased safety awareness and decreased memory and decreased standing balance, hemiplegia and decreased balance strategies.  Prior to hospitalization, patient could complete ADLs with independent .  Patient will benefit from skilled intervention to increase independence with basic self-care skills prior to discharge home with care partner.  Anticipate patient will require 24 hour supervision and OT follow up recommended.  OT - End of Session Activity Tolerance: Improving;Tolerates < 10 min activity, no significant change in vital signs Endurance Deficit: Yes OT Assessment Rehab Potential: Good (for stated goals) Barriers to Discharge:  (none) OT Patient demonstrates impairments in the following area(s): Balance;Cognition;Endurance;Motor;Safety;Vision;Sensory OT Basic ADL's Functional Problem(s): Grooming;Bathing;Dressing;Toileting OT Advanced ADL's Functional Problem(s):  (husband performs) OT Transfers Functional Problem(s): Toilet;Tub/Shower OT Additional Impairment(s):  (L UE strengthening) OT Plan OT Intensity: Minimum of 1-2 x/day, 45 to 90 minutes OT Frequency: 5 out of 7 days OT Duration/Estimated Length of Stay: 7 days OT Treatment/Interventions: Balance/vestibular training;Discharge planning;Cognitive remediation/compensation;DME/adaptive equipment instruction;Functional mobility training;Pain management;Psychosocial support;Therapeutic Activities;UE/LE Strength taining/ROM;Visual/perceptual remediation/compensation;UE/LE Coordination activities;Therapeutic Exercise;Self Care/advanced ADL retraining;Patient/family education;Neuromuscular  re-education OT Basic Self-Care Anticipated Outcome(s): S overall OT Toileting Anticipated Outcome(s): S overall OT Bathroom Transfers Anticipated Outcome(s): S overall OT Recommendation Recommendations for Other Services: Neuropsych consult;Other (comment) (RT) Patient destination: Home Follow Up Recommendations: 24 hour supervision/assistance Equipment Recommended: 3 in  1 bedside comode;Tub/shower bench Equipment Details: has cane   Skilled Therapeutic Intervention OT evaluation initiated and completed. OT educated pt and spouse on OT purpose, POC, and goals. Pt and spouse in full agreement with POC. Pt performed ADL session seated on EOB with close supervision for balance while seated. Pt displaying L inattention and requiring Max verbal cues to attend to L side while bathing. Dressing also performed seated EOB with Min A for dynamic standing balance while putting skirt and undergarments up. Family requesting pt to return home as soon as possible and able to provide 24/7 supervision.   OT Evaluation Precautions/Restrictions  Precautions Precautions: Fall Precaution Comments: L inattention Restrictions Weight Bearing Restrictions: No General Chart Reviewed: Yes Vital Signs Therapy Vitals Temp: 98.6 F (37 C) Temp src: Oral Pulse Rate: 50 Resp: 18 BP: 118/61 mmHg Patient Position (if appropriate): Lying Oxygen Therapy SpO2: 98 % O2 Device: None (Room air) Pain Pain Assessment Pain Assessment: No/denies pain Pain Score: 0-No pain Home Living/Prior Functioning Home Living Family/patient expects to be discharged to:: Private residence Living Arrangements: Spouse/significant other Available Help at Discharge: Family Type of Home: House Home Access: Stairs to enter Technical brewer of Steps: 2 Entrance Stairs-Rails: None Home Layout: One level Additional Comments: tub/shower with curtain  Lives With: Spouse Prior Function Level of Independence: Independent with  transfers;Independent with gait;Independent with basic ADLs  Able to Take Stairs?: Yes Driving: Yes Vocation: Retired Leisure: Hobbies-yes (Comment) Comments: church activities, "fellowship" with others, gardening/flowers Vision/Perception  Vision- History Baseline Vision/History: Wears glasses Wears Glasses: Reading only Patient Visual Report: Other (comment) (Pt reports no change from baseline) Vision- Assessment Vision Assessment?: Vision impaired- to be further tested in functional context Additional Comments: L inattention  Cognition Overall Cognitive Status: Impaired/Different from baseline Arousal/Alertness: Lethargic (Pt reports, "I am so tired. I need some sleep.") Orientation Level: Oriented to person;Oriented to place;Oriented to situation;Disoriented to time Attention: Sustained Sustained Attention: Impaired Sustained Attention Impairment: Verbal basic;Functional basic Memory: Impaired Memory Impairment: Retrieval deficit;Decreased recall of new information Awareness: Impaired Awareness Impairment: Intellectual impairment Problem Solving: Impaired Problem Solving Impairment: Verbal basic;Functional basic Executive Function: Reasoning;Decision Making;Initiating;Self Monitoring;Self Correcting Reasoning: Impaired Reasoning Impairment: Functional basic;Verbal basic Decision Making: Impaired Decision Making Impairment: Verbal basic;Functional basic Initiating: Impaired Initiating Impairment: Verbal basic;Functional basic Self Monitoring: Impaired Self Monitoring Impairment: Functional basic;Verbal basic Self Correcting: Impaired Self Correcting Impairment: Verbal basic;Functional basic Safety/Judgment: Impaired Sensation Sensation Light Touch: Impaired Detail Light Touch Impaired Details: Impaired LLE;Impaired LUE Stereognosis: Not tested Hot/Cold: Impaired Detail Hot/Cold Impaired Details: Impaired LUE;Impaired LLE Proprioception: Not tested Coordination Gross  Motor Movements are Fluid and Coordinated: No Fine Motor Movements are Fluid and Coordinated: No Coordination and Movement Description: decreased coordination LUE > LLE. Pt unable to fasten bra and increased time with buttons and zippers on clothing Heel Shin Test: impaired LLE Motor  Motor Motor: Hemiplegia;Abnormal postural alignment and control Motor - Skilled Clinical Observations: L hemiplegia UE > LE, impairments due to L inattention Mobility  Bed Mobility Bed Mobility: Supine to Sit;Sit to Supine Supine to Sit: 5: Supervision Sit to Supine: 5: Supervision  Trunk/Postural Assessment  Cervical Assessment Cervical Assessment: Within Functional Limits Thoracic Assessment Thoracic Assessment: Exceptions to [facility] (kyphotic posture) Lumbar Assessment Lumbar Assessment: Exceptions to [facility] (hx of back pain) Postural Control Postural Control: Deficits on evaluation Righting Reactions: delayed/impaired, limited by L inattention Protective Responses: delayed/impaired, limited by L inattention  Balance Balance Balance Assessed: Yes Dynamic Sitting Balance Sitting balance - Comments: Min A while seated EOB  for bathing and dressing Static Standing Balance Static Standing - Balance Support: During functional activity Static Standing - Level of Assistance: 5: Stand by assistance Dynamic Standing Balance Dynamic Standing - Balance Support: During functional activity;No upper extremity supported Dynamic Standing - Level of Assistance: 4: Min assist Dynamic Standing - Balance Activities:  (LB dressing and bathing) Extremity/Trunk Assessment RUE Assessment RUE Assessment: Within Functional Limits LUE Assessment LUE Assessment: Exceptions to WFL (AROM is WFLs, strength 3+/5 grossly)  FIM:  FIM - Grooming Grooming Steps: Wash, rinse, dry face;Wash, rinse, dry hands;Oral care, brush teeth, clean dentures Grooming: 4: Patient completes 3 of 4 or 4 of 5 steps FIM - Bathing Bathing Steps  Patient Completed: Chest;Right Arm;Left Arm;Abdomen;Front perineal area;Right upper leg;Left upper leg;Right lower leg (including foot);Left lower leg (including foot) Bathing: 4: Min-Patient completes 8-9 83f10 parts or 75+ percent FIM - Upper Body Dressing/Undressing Upper body dressing/undressing steps patient completed: Thread/unthread right bra strap;Thread/unthread left bra strap;Thread/unthread right sleeve of pullover shirt/dresss;Thread/unthread left sleeve of pullover shirt/dress;Put head through opening of pull over shirt/dress;Pull shirt over trunk Upper body dressing/undressing: 4: Min-Patient completed 75 plus % of tasks FIM - Lower Body Dressing/Undressing Lower body dressing/undressing steps patient completed: Thread/unthread right underwear leg;Thread/unthread left underwear leg;Pull underwear up/down;Thread/unthread right pants leg;Thread/unthread left pants leg;Fasten/unfasten pants;Don/Doff right sock;Don/Doff left sock;Pull pants up/down Lower body dressing/undressing: 4: Min-Patient completed 75 plus % of tasks FIM - Toileting Toileting: 0: Activity did not occur FIM - BControl and instrumentation engineerDevices: Arm rests;Walker Bed/Chair Transfer: 5: Supine > Sit: Supervision (verbal cues/safety issues);5: Sit > Supine: Supervision (verbal cues/safety issues);4: Bed > Chair or W/C: Min A (steadying Pt. > 75%);4: Chair or W/C > Bed: Min A (steadying Pt. > 75%) FIM - TAir cabin crewTransfers: 0-Activity did not occur FIM - TCamera operatorTransfers: 0-Activity did not occur or was simulated   Refer to Care Plan for Long Term Goals  Recommendations for other services: None, Neuropsych and Other: RT  Discharge Criteria: Patient will be discharged from OT if patient refuses treatment 3 consecutive times without medical reason, if treatment goals not met, if there is a change in medical status, if patient makes no progress towards goals or  if patient is discharged from hospital.  The above assessment, treatment plan, treatment alternatives and goals were discussed and mutually agreed upon: by patient and by family  PPhineas Semen8/26/2015, 5:29 PM

## 2014-02-09 NOTE — Care Management Note (Signed)
Inpatient Rehabilitation Center Individual Statement of Services  Patient Name:  [name]  Date:  02/09/2014  Welcome to the [facility].  Our goal is to provide you with an individualized program based on your diagnosis and situation, designed to meet your specific needs.  With this comprehensive rehabilitation program, you will be expected to participate in at least 3 hours of rehabilitation therapies Monday-Friday, with modified therapy programming on the weekends.  Your rehabilitation program will include the following services:  Physical Therapy (PT), Occupational Therapy (OT), Speech Therapy (ST), 24 hour per day rehabilitation nursing, Case Management (Social Worker), Rehabilitation Medicine, Nutrition Services and Pharmacy Services  Weekly team conferences will be held on Wednesday to discuss your progress.  Your Social Worker will talk with you frequently to get your input and to update you on team discussions.  Team conferences with you and your family in attendance may also be held.  Expected length of stay: 7 days  Overall anticipated outcome: supervision level  Depending on your progress and recovery, your program may change. Your Social Worker will coordinate services and will keep you informed of any changes. Your Social Worker's name and contact numbers are listed  below.  The following services may also be recommended but are not provided by the [facility] will be made to provide these services after discharge if needed.  Arrangements include referral to agencies that provide these services.  Your insurance has been verified to be:  [employer] Your primary doctor is:  Jerene Pitch  Pertinent information will be shared with your doctor and your insurance company.  Social Worker:  Ovidio Kin, [place] or ([phone]  Information discussed with and copy given to patient by: Elease Hashimoto, 02/09/2014, 4:09 PM

## 2014-02-09 NOTE — Evaluation (Signed)
Speech Language Pathology Assessment and Plan  Patient Details  Name: [name] MRN: [id number] Date of Birth: [birth year]  SLP Diagnosis: Cognitive Impairments  Rehab Potential: Good ELOS: 14 days   Today's Date: 02/09/2014 SLP Individual Time: 1937-9024 SLP Individual Time Calculation (min): 60 min   Problem List:  Patient Active Problem List   Diagnosis Date Noted  . 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 ICH (intracerebral hemorrhage) 02/03/2014  . Intracranial bleed 02/03/2014  . Shortness of breath 03/04/2012  . Bilateral lower extremity edema 03/04/2012  . Knee pain, bilateral 03/04/2012  . Cardiomegaly 03/04/2012  . Obesity (BMI 30-39.9) 03/04/2012  . Fatigue 03/04/2012  . Chronic kidney disease unknown stage 03/04/2012  . Dizziness 03/04/2012  . Sleep disturbance 03/04/2012  . Hemorrhoids 07/03/2006  . TOBACCO ABUSE, HX OF 07/03/2006  . HYPERTENSION 04/30/2006  . GERD 04/30/2006  . LOW BACK PAIN 04/30/2006  . Hx of alcohol use 04/30/2006   Past Medical History:  Past Medical History  Diagnosis Date  . GERD (gastroesophageal reflux disease)   . Arthritis   . Hypertension   . Shortness of breath   . Sleep apnea    Past Surgical History: No past surgical history on file.  Assessment / Plan / Recommendation Clinical Impression [name] is a 68 y.o. right-handed female with history of GERD, HTN--no medications X 2 years; who was admitted on 02/02/14 with sudden onset of left sided weakness, facial droop as well as speech difficulty as well as HA. BP at admission 192/104. 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 Patient did not receive TPA. Carotid dopplers with 1-39% ICS stenosis. 2D echo with EF 65-70%, calcified MV and grade 1 diastolic dysfunction. Follow up CCT with stable hemorrhage. Therapy initiated and patient limited by left sided weak, left inattention, has  difficulty following commands with decreased awareness of deficits. Subcutaneous heparin added for DVT prophylaxis 02/04/2014. Maintained on a regular consistency diet. CIR recommended by MD and therapy team. Patient was admitted for comprehensive rehabilitation program 02/08/14. Cognitive-linguistic evaluation completed.  Patient demonstrates moderate cognitive deficits characterized by impaired sustained attention, orientation, recall, intellectual awareness, left attention and basic problem solving resulting in decreased safety awareness during completion of basic self-care tasks.  Prior to admission patient and spouse reported that she had poor recall, reading, writing, math and clock use skilled; however, she was functional with use of compensatory strategies and was independently driving, cooking and cleaning.  As a result, skilled SLP services are recommended to address deficits, maximize functional independence and reduce overall burden of care prior to discharge home with 24/7 family Supervision.     Skilled Therapeutic Interventions           Cognitive-linguistic evaluation completed with results and recommendations reviewed with patient and family.  SLP also initiated a skilled intervention during clock drawing task to address left inattention with Max multimodal cues.     SLP Assessment  Patient will need skilled [facility] during CIR admission    Recommendations  Oral Care Recommendations: Oral care BID Patient destination: Home Follow up Recommendations: 24 hour supervision/assistance;Outpatient SLP Equipment Recommended: None recommended by SLP    SLP Frequency 5 out of 7 days   SLP Treatment/Interventions Cognitive remediation/compensation;Cueing hierarchy;Environmental controls;Functional tasks;Internal/external aids;Medication managment;Patient/family education;Therapeutic Activities    Pain Pain Assessment Pain Assessment: No/denies pain  Prior  Functioning Cognitive/Linguistic Baseline: Baseline deficits Baseline deficit details: poor  recall, reading, writing, math and time calculations PTA Type of Home: House  Lives With: Spouse Available Help at Discharge: Family;Available 24 hours/day Vocation: Retired (worked at [employer] for 14 years)  Short Term Goals: Week 1: SLP Short Term Goal 1 (Week 1): Patient will sustain attention to fuctional task for 5 minutes with Mod cues for redirections SLP Short Term Goal 2 (Week 1): Patient will label 1 physical and 1 cognitive deficit with Mod question cues SLP Short Term Goal 3 (Week 1): Patient will initiate use of external aids to for orientation and recall with Mod question cues SLP Short Term Goal 4 (Week 1): Patient will attend to left of environment during functional tasks with Min question cues  See FIM for current functional status Refer to Care Plan for Long Term Goals  Recommendations for other services: None  Discharge Criteria: Patient will be discharged from SLP if patient refuses treatment 3 consecutive times without medical reason, if treatment goals not met, if there is a change in medical status, if patient makes no progress towards goals or if patient is discharged from hospital.  The above assessment, treatment plan, treatment alternatives and goals were discussed and mutually agreed upon: by patient and by family  Carmelia Roller., [place]  [place] 02/09/2014, 11:56 AM

## 2014-02-10 ENCOUNTER — Inpatient Hospital Stay (HOSPITAL_COMMUNITY): Payer: Medicare HMO | Admitting: Occupational Therapy

## 2014-02-10 ENCOUNTER — Inpatient Hospital Stay (HOSPITAL_COMMUNITY): Payer: Medicare HMO | Admitting: Speech Pathology

## 2014-02-10 ENCOUNTER — Inpatient Hospital Stay (HOSPITAL_COMMUNITY): Payer: Medicare HMO | Admitting: Physical Therapy

## 2014-02-10 DIAGNOSIS — I10 Essential (primary) hypertension: Secondary | ICD-10-CM

## 2014-02-10 DIAGNOSIS — Z5189 Encounter for other specified aftercare: Secondary | ICD-10-CM

## 2014-02-10 DIAGNOSIS — I619 Nontraumatic intracerebral hemorrhage, unspecified: Secondary | ICD-10-CM

## 2014-02-10 NOTE — Progress Notes (Signed)
Social Work Patient ID: [name], female   DOB: 29-Aug-1945, 68 y.o.   MRN: [id number] Met with pt, husband and daughter's to answer questions and give team conference results.  Aware of her supervision level goals and discharge date of 9/2. Pt reported MD said she could leave earlier if continued to do well here.  Daughter's wanted to complete Medicaid application, gave and completed will send in. Also had questions regarding scooter, which pt is ambulating over 100 ft at supervision level.  Also wants to see if CPAP covered by her insurance. Will check into this. Pt plans to now follow up with MD and take her medicines like she is suppose too.  Will touch base with team if need to move up discharge date.

## 2014-02-10 NOTE — Plan of Care (Signed)
Problem: RH PAIN MANAGEMENT Goal: RH STG PAIN MANAGED AT OR BELOW [name] PAIN GOAL </=3  Outcome: Progressing PRN tylenol for headache effective in reducing pain to acceptable level

## 2014-02-10 NOTE — Progress Notes (Signed)
68 y.o. right-handed female with history of GERD, HTN--no medications X 2 years; who was admitted on 02/02/14 with sudden onset of left sided weakness, facial droop as well as speech difficulty as well as HA. BP at admission 192/104. 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 Patient did not receive TPA. Carotid dopplers with 1-39% ICS stenosis. 2D echo with EF 65-70%, calcified MV and grade 1 diastolic dysfunction. Follow up CCT with stable hemorrhage  Subjective/Complaints: Pt asking when she can go home, no pains, voiding well Is there fluid on my brain?  Review of Systems - Negative except weakness on left and constipation Objective: Vital Signs: Blood pressure 144/66, pulse 50, temperature 98 F (36.7 C), temperature source Oral, resp. rate 18, height 5' 4"  (1.626 m), weight 101.1 kg (222 lb 14.2 oz), SpO2 100.00%. No results found. Results for orders placed during the hospital encounter of 02/08/14 (from the past 72 hour(s))  CBC WITH DIFFERENTIAL     Status: Abnormal   Collection Time    02/09/14  4:15 AM      Result Value Ref Range   WBC 6.3  4.0 - 10.5 K/uL   RBC 4.79  3.87 - 5.11 MIL/uL   Hemoglobin 13.2  12.0 - 15.0 g/dL   HCT 40.9  36.0 - 46.0 %   MCV 85.4  78.0 - 100.0 fL   MCH 27.6  26.0 - 34.0 pg   MCHC 32.3  30.0 - 36.0 g/dL   RDW 13.6  11.5 - 15.5 %   Platelets 252  150 - 400 K/uL   Neutrophils Relative % 51  43 - 77 %   Neutro Abs 3.2  1.7 - 7.7 K/uL   Lymphocytes Relative 34  12 - 46 %   Lymphs Abs 2.1  0.7 - 4.0 K/uL   Monocytes Relative 10  3 - 12 %   Monocytes Absolute 0.6  0.1 - 1.0 K/uL   Eosinophils Relative 3  0 - 5 %   Eosinophils Absolute 0.2  0.0 - 0.7 K/uL   Basophils Relative 2 (*) 0 - 1 %   Basophils Absolute 0.1  0.0 - 0.1 K/uL  COMPREHENSIVE METABOLIC PANEL     Status: Abnormal   Collection Time    02/09/14  4:15 AM      Result Value Ref Range   Sodium 138   137 - 147 mEq/L   Potassium 4.3  3.7 - 5.3 mEq/L   Chloride 101  96 - 112 mEq/L   CO2 24  19 - 32 mEq/L   Glucose, Bld 105 (*) 70 - 99 mg/dL   BUN 29 (*) 6 - 23 mg/dL   Creatinine, Ser 1.34 (*) 0.50 - 1.10 mg/dL   Calcium 9.4  8.4 - 10.5 mg/dL   Total Protein 7.9  6.0 - 8.3 g/dL   Albumin 3.6  3.5 - 5.2 g/dL   AST 24  0 - 37 U/L   ALT 41 (*) 0 - 35 U/L   Alkaline Phosphatase 70  39 - 117 U/L   Total Bilirubin 0.4  0.3 - 1.2 mg/dL   GFR calc non Af Amer 40 (*) >90 mL/min   GFR calc Af Amer 46 (*) >90 mL/min   Comment: (NOTE)     The eGFR has been calculated using the CKD EPI equation.     This calculation has not been validated in all clinical situations.  eGFR's persistently <90 mL/min signify possible Chronic Kidney     Disease.   Anion gap 13  5 - 15     HEENT: normal and no facial droop Cardio: RRR and no murmur Resp: CTA B/L and unlabored GI: BS positive and NT, ND Extremity:  Pulses positive and No Edema Skin:   Intact Neuro: Alert/Oriented, Cranial Nerve II-XII normal, Abnormal Sensory reduce light touch, pp, and proprio LUE and LLE,  Motor 5/5 bilat delt bi tri grip, Abnormal FMC Ataxic/ dec FMC  Musc/Skel:  Normal and Other no pain with AROM left shoulder GEN NAD   Assessment/Plan: 1. 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 Physiatrist is providing close team supervision and 24 hour management of active medical problems listed below. Physiatrist and rehab team continue to assess barriers to discharge/monitor patient progress toward functional and medical goals. FIM: FIM - Bathing Bathing Steps Patient Completed: Chest;Right Arm;Left Arm;Abdomen;Front perineal area;Right upper leg;Left upper leg;Right lower leg (including foot);Left lower leg (including foot) Bathing: 4: Min-Patient completes 8-9 [id number] parts or 75+ percent  FIM - Upper Body  Dressing/Undressing Upper body dressing/undressing steps patient completed: Thread/unthread right bra strap;Thread/unthread left bra strap;Thread/unthread right sleeve of pullover shirt/dresss;Thread/unthread left sleeve of pullover shirt/dress;Put head through opening of pull over shirt/dress;Pull shirt over trunk Upper body dressing/undressing: 4: Min-Patient completed 75 plus % of tasks FIM - Lower Body Dressing/Undressing Lower body dressing/undressing steps patient completed: Thread/unthread right underwear leg;Thread/unthread left underwear leg;Pull underwear up/down;Thread/unthread right pants leg;Thread/unthread left pants leg;Fasten/unfasten pants;Don/Doff right sock;Don/Doff left sock;Pull pants up/down Lower body dressing/undressing: 4: Min-Patient completed 75 plus % of tasks  FIM - Toileting Toileting steps completed by patient: Adjust clothing prior to toileting Toileting Assistive Devices: Grab bar or rail for support Toileting: 2: Max-Patient completed 1 of 3 steps  FIM - TRadio producerDevices: WInsurance account managerTransfers: 4-To toilet/BSC: Min A (steadying Pt. > 75%);4-From toilet/BSC: Min A (steadying Pt. > 75%)  FIM - Bed/Chair Transfer Bed/Chair Transfer Assistive Devices: Arm rests;Walker Bed/Chair Transfer: 5: Supine > Sit: Supervision (verbal cues/safety issues);5: Sit > Supine: Supervision (verbal cues/safety issues);4: Bed > Chair or W/C: Min A (steadying Pt. > 75%);4: Chair or W/C > Bed: Min A (steadying Pt. > 75%)  FIM - Locomotion: Wheelchair Distance: 30 ft Locomotion: Wheelchair: 1: Travels less than 50 ft with maximal assistance (Pt: 25 - 49%) FIM - Locomotion: Ambulation Locomotion: Ambulation Assistive Devices: WAdministratorAmbulation/Gait Assistance: 4: Min assist;4: Min guard Locomotion: Ambulation: 2: Travels 50 - 149 ft with minimal assistance (Pt.>75%)  Comprehension Comprehension Mode: Auditory Comprehension: 5-Follows  basic conversation/direction: With extra time/assistive device  Expression Expression Mode: Verbal Expression: 5-Expresses basic needs/ideas: With no assist  Social Interaction Social Interaction: 3-Interacts appropriately 50 - 74% of the time - May be physically or verbally inappropriate.  Problem Solving Problem Solving: 2-Solves basic 25 - 49% of the time - needs direction more than half the time to initiate, plan or complete simple activities  Memory Memory: 2-Recognizes or recalls 25 - 49% of the time/requires cueing 51 - 75% of the time  Medical Problem List and Plan:   1. 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 DVT Prophylaxis/Anticoagulation: Subcutaneous heparin initiated 02/04/2014. Monitor for any bleeding episodes   3. Pain Management: Tylenol as needed   4. Hypertension. Norvasc 10 mg daily, Lopressor 75 mg  twice a day. Monitor with increased mobility   5. Neuropsych: This patient is capable of making decisions on her own behalf.   6. Skin/Wound Care: Routine skin checks   7. Hyperlipidemia. Lipitor   8. GERD. Protonix   9. Peripheral neuropathy: ?etiology---may be related to prior [facility] abuse             -will add low dose gabapentin   LOS (Days) 2 A FACE TO FACE EVALUATION WAS PERFORMED  KIRSTEINS,ANDREW E 02/10/2014, 6:56 AM

## 2014-02-10 NOTE — Progress Notes (Signed)
Physical Therapy Session Note  Patient Details  Name: [name] MRN: [id number] Date of Birth: May 21, 1946  Today's Date: 02/10/2014 PT Individual Time: 1406-1506 PT Individual Time Calculation (min): 60 min   Short Term Goals: Week 1:  PT Short Term Goal 1 (Week 1): = LTGs due to anticipated LOS  Skilled Therapeutic Interventions/Progress Updates:   Pt received asleep in bed, daughter at bedside. Session focused on activity tolerance, balance, gait, LUE/LE NMR, attending to left. Pt requires max tactile cues and name calling to arouse by therapist and daughter, pt repeatedly stating that she will get up but remaining in bed with eyes closed for prolonged time. After approximately 15 min, patient reporting need for bathroom. Coaxed patient to transfer supine > sit with supervision and bed rail. Pt ambulated to/from bathroom using RW with min A. Gait training using RW 2 x 100 ft, overall min guard and max verbal cues to attend to LUE on hand grip and cues for increased L step length due to decreased proprioception and L inattention. Pt with effective carryover with improved B step length. For coordination and LLE NMR, pt performed step taps to small step using LLE, requires mod-max A for balance and cues for foot placement as pt tended to step L foot on step in front of R foot, resulting in LOB. For LUE NMR and attending to L, pt performed reaching task for LUE to place/remove clothespins at top of vertical rod. Pt dropped one clothespin, requires two attempts and mod A for balance to pick up object from floor. Pt requires frequent seated rest breaks due to fatigue and chronic back pain in standing. Pt returned to room and left seated in recliner with quick release belt on, all needs within reach. Continue to reinforce safety in room per daughter's report that patient was found by family ambulating in room again without supervision.   Therapy Documentation Precautions:   Precautions Precautions: Fall Precaution Comments: L inattention Restrictions Weight Bearing Restrictions: No Pain: Pain Assessment Pain Assessment: No/denies pain  See FIM for current functional status  Therapy/Group: Individual Therapy  Laretta Alstrom 02/10/2014, 3:08 PM

## 2014-02-10 NOTE — Patient Care Conference (Signed)
Inpatient RehabilitationTeam Conference and Plan of Care Update Date: 02/09/2014   Time: 11;55 AM    Patient Name: [name]      Medical Record Number: [id number]  Date of Birth: 05/01/1946 Sex: Female         Room/Bed: 4M04C/4M04C-01 Payor Info: Payor: [employer] MEDICARE / Plan: [employer] [employer] / Product Type: *No Product type* /    Admitting Diagnosis: R THALAMIC HEMORRHAGE  Admit Date/Time:  02/08/2014  3:54 PM Admission Comments: No comment available   Primary Diagnosis:  <principal problem not specified> Principal Problem: <principal problem not specified>  Patient Active Problem List   Diagnosis Date Noted  . 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 ICH (intracerebral hemorrhage) 02/03/2014  . Intracranial bleed 02/03/2014  . Shortness of breath 03/04/2012  . Bilateral lower extremity edema 03/04/2012  . Knee pain, bilateral 03/04/2012  . Cardiomegaly 03/04/2012  . Obesity (BMI 30-39.9) 03/04/2012  . Fatigue 03/04/2012  . Chronic kidney disease unknown stage 03/04/2012  . Dizziness 03/04/2012  . Sleep disturbance 03/04/2012  . Hemorrhoids 07/03/2006  . TOBACCO ABUSE, HX OF 07/03/2006  . HYPERTENSION 04/30/2006  . GERD 04/30/2006  . LOW BACK PAIN 04/30/2006  . Hx of alcohol use 04/30/2006    Expected Discharge Date: Expected Discharge Date: 02/16/14  Team Members Present: Physician leading conference: Dr. Alysia Penna Social Worker Present: Ovidio Kin, LCSW Nurse Present: Heather Roberts, RN PT Present: Carney Living, PT OT Present: Benay Pillow, OT SLP Present: Windell Moulding, SLP PPS Coordinator present : Daiva Nakayama, RN, CRRN     Current Status/Progress Goal Weekly Team Focus  Medical   left side weakness, HTN  gradually reduce BP  assessment and d/c planning   Bowel/Bladder   Continent of bowel and bladder; LBM 8/24  Continent of bowel and bladder  Continue giving patient scheduled bowel medication; offer bathroom PRN   Swallow/Nutrition/  Hydration     WFL        ADL's   overall Min A for self care, functional transfers, and functional mobility  Supervision  Attending to L, endurance, safety, ADL retraining, patient and family education   Mobility   Overall min A for gait up to 100 ft using RW, transfers, stairs with 2 rails, supervision bed mobility  Supervision  attending to L, activity tolerance, safety with functional mobility, L NMR   Communication   [facility]  N/A      Safety/Cognition/ Behavioral Observations  Mod assist   Supervision   increase sustained and left attention    Pain   Headache- 8/10 treated with tylenol  Pain </=3  Offer pain medication prior to therapy and PRN   Skin   No skin issues noted  No new skin breakdown  Assist patient with pressure relief q2hr in bed and q1hr in wheelchair      *See Care Plan and progress notes for long and short-term goals.  Barriers to Discharge: still need physical assist    Possible Resolutions to Barriers:  upgrade to sup so husband can care for her    Discharge Planning/Teaching Needs:    Home with husband who can provide supervision level and their three daughter's-husband has health issues of his own     Team Discussion:  New eval doing well. Goals set at supervision level.  Husband is here to observe in therapies. Left-inattention issues  Revisions to Treatment Plan:  New eval   Continued Need for Acute Rehabilitation Level of Care: The patient requires daily medical management by  a physician with specialized training in physical medicine and rehabilitation for the following conditions: Daily direction of a multidisciplinary physical rehabilitation program to ensure safe treatment while eliciting the highest outcome that is of practical value to the patient.: Yes Daily medical management of patient stability for increased activity during participation in an intensive rehabilitation regime.: Yes Daily analysis of laboratory values and/or radiology reports  with any subsequent need for medication adjustment of medical intervention for : Neurological problems  Sarah Baez, [place] 02/10/2014, 3:36 PM

## 2014-02-10 NOTE — Progress Notes (Signed)
Speech Language Pathology Daily Session Note  Patient Details  Name: [name] MRN: [id number] Date of Birth: 04-Oct-1945  Today's Date: 02/10/2014 SLP Individual Time: 1103-1203 SLP Individual Time Calculation (min): 60 min  Short Term Goals: Week 1: SLP Short Term Goal 1 (Week 1): Patient will sustain attention to fuctional task for 5 minutes with Mod cues for redirections SLP Short Term Goal 2 (Week 1): Patient will label 1 physical and 1 cognitive deficit with Mod question cues SLP Short Term Goal 3 (Week 1): Patient will initiate use of external aids to for orientation and recall with Mod question cues SLP Short Term Goal 4 (Week 1): Patient will attend to left of environment during functional tasks with Min question cues  Skilled Therapeutic Interventions:  Pt was seen for skilled speech therapy targeting cognitive goals.  Upon arrival, pt was asleep in recliner, easily awakened to voice and light touch.  Pt was pleasantly interactive and participatory throughout therapy session.  Pt required overall mod assist multimodal cuing to sustain attention to basic self care tasks to don socks and shoes prior to transfer to wheelchair as she was noted to become distracted when multitasking (i.e. Attempting to converse with daughter while donning shoes and socks).  SLP facilitated the session with a structured sorting task targeting organization and sustained attention with pt requiring overall supervision cues to complete task with 100% accuracy.  Furthermore, pt completed a 4-step picture sequencing task with mod faded to min assist verbal and visual cues to increase awareness of perseveration.  Continue per current plan of care.   FIM:  Comprehension Comprehension Mode: Auditory Comprehension: 5-Follows basic conversation/direction: With extra time/assistive device Expression Expression Mode: Verbal Expression: 5-Expresses basic needs/ideas: With extra time/assistive device Social  Interaction Social Interaction: 4-Interacts appropriately 75 - 89% of the time - Needs redirection for appropriate language or to initiate interaction. Problem Solving Problem Solving: 3-Solves basic 50 - 74% of the time/requires cueing 25 - 49% of the time Memory Memory: 2-Recognizes or recalls 25 - 49% of the time/requires cueing 51 - 75% of the time  Pain Pain Assessment Pain Assessment: No/denies pain  Therapy/Group: Individual Therapy  Windell Moulding, M.A. CCC-SLP  Arla Boutwell, Selinda Orion 02/10/2014, 12:50 PM

## 2014-02-10 NOTE — Progress Notes (Signed)
Occupational Therapy Session Note  Patient Details  Name: [name] MRN: [id number] Date of Birth: 02-25-1946  Today's Date: 02/10/2014 OT Individual Time: [phone] OT Individual Time Calculation (min): 60 min    Short Term Goals: Week 1:  OT Short Term Goal 1 (Week 1): STG = LTG secondary to short ELOS  Skilled Therapeutic Interventions/Progress Updates:  Pt with 6/10 c/o pain in lower back this session. RN notified and pain medication given. Patient has 2 daughters present during ADL session for family education. Pt ambulated with RW and steady assist to bathroom for tub bench transfer. Pt performing transfer with Min A for safety and balance. Pt demonstrating impulsive behavior of standing up in shower after therapist providing verbal cues and demonstration of how wash buttocks without standing. OT and pt later discussing situation and pt agreeing that she should not of stood up because "I could of fell." Pt performed dressing seated on EOB with SBA for sitting balance. Pt performed UB dressing with set up A. Pt hooking bra and putting on like sports bra with verbal cues from therapist. Pt wearing jean jumper this session but did put on underwear with Min A for balance while standing with use of RW. Pt stood at sink side for grooming with Min A while standing and brushing teeth and combing hair with left hand. OT discussed energy conservation techniques with pt to be utilized during ADLs. Education to continue.Pt in supine position upon exiting the room as pt reports continued increase in lower back pain and laying down alleviates pain. Call bell and all other items within reach upon entering the room.  Therapy Documentation Precautions:  Precautions Precautions: Fall Precaution Comments: L inattention Restrictions Weight Bearing Restrictions: No Pain: Pain Assessment Pain Assessment: 0-10 Pain Score: 6  Pain Location: Back Pain Orientation: Lower Pain Descriptors /  Indicators: Aching Pain Frequency: Occasional Pain Onset: On-going Patients Stated Pain Goal: 2 Pain Intervention(s): Medication (See eMAR);Repositioned Multiple Pain Sites: No  See FIM for current functional status  Therapy/Group: Individual Therapy  Phineas Semen 02/10/2014, 12:37 PM

## 2014-02-11 ENCOUNTER — Inpatient Hospital Stay (HOSPITAL_COMMUNITY): Payer: Medicare HMO | Admitting: Physical Therapy

## 2014-02-11 ENCOUNTER — Inpatient Hospital Stay (HOSPITAL_COMMUNITY): Payer: Medicare HMO | Admitting: Speech Pathology

## 2014-02-11 ENCOUNTER — Encounter (HOSPITAL_COMMUNITY): Payer: Medicare HMO | Admitting: Occupational Therapy

## 2014-02-11 MED ORDER — ALUM & MAG HYDROXIDE-SIMETH 200-200-20 MG/5ML PO SUSP
30.0000 mL | Freq: Four times a day (QID) | ORAL | Status: DC | PRN
  Administered 2014-02-11 – 2014-02-14 (×3): 30 mL via ORAL
  Filled 2014-02-11 (×3): qty 30

## 2014-02-11 NOTE — Progress Notes (Signed)
Occupational Therapy Session Note  Patient Details  Name: [name] MRN: [id number] Date of Birth: 1945-06-25  Today's Date: 02/11/2014 OT Individual Time: 0905-1005 OT Individual Time Calculation (min): 60 min    Short Term Goals: Week 1:  OT Short Term Goal 1 (Week 1): STG = LTG secondary to short ELOS  Skilled Therapeutic Interventions/Progress Updates:    pt performed shower and dressing this session.  She was able to ambulate to the shower seat without use of assistive device and min hand held assist.  She was able to perform all bathing sit to stand with supervision and min instructional cueing to integrate the LUE as much as possible.  Ambulated back out to the bedside chair with min assist for dressing tasks.  She was able to perform all UB dressing with supervision including donning bra over her head after fastening it.  She needed mod instructional cueing for sequencing LB dressing, in order to start with the LLE instead of the right.  She demonstrated increased difficulty donning socks including using both UEs but instead just use the RUE.  Finished by using the stool to prop up her foot to work on tying her shoes after crossing both LEs over her knees to donn the shoes first.  Encouraged pt to use the LUE as much as possible with functional tasks including reaching back to the chair to sit down and then for pushing up from the chair to stand.   Therapy Documentation Precautions:  Precautions Precautions: Fall Precaution Comments: L inattention Restrictions Weight Bearing Restrictions: No  Pain: Pain Assessment Pain Assessment: Faces Faces Pain Scale: Hurts little more Pain Type: Chronic pain Pain Location: Back Pain Orientation: Lower Pain Descriptors / Indicators: Aching;Dull;Discomfort Pain Frequency: Intermittent Pain Onset: On-going Patients Stated Pain Goal: 1 Pain Intervention(s): Medication (See eMAR) ADL: See FIM for current functional  status  Therapy/Group: Individual Therapy  Taichi Repka OTR/L 02/11/2014, 11:22 AM
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
## 2014-02-11 NOTE — Progress Notes (Signed)
Speech Language Pathology Daily Session Note  Patient Details  Name: [name] MRN: [id number] Date of Birth: 02-09-1946  Today's Date: 02/11/2014 SLP Individual Time: 1108-1210 SLP Individual Time Calculation (min): 62 min  Short Term Goals: Week 1: SLP Short Term Goal 1 (Week 1): Patient will sustain attention to fuctional task for 5 minutes with Mod cues for redirections SLP Short Term Goal 2 (Week 1): Patient will label 1 physical and 1 cognitive deficit with Mod question cues SLP Short Term Goal 3 (Week 1): Patient will initiate use of external aids to for orientation and recall with Mod question cues SLP Short Term Goal 4 (Week 1): Patient will attend to left of environment during functional tasks with Min question cues  Skilled Therapeutic Interventions:  Pt was seen for skilled speech therapy targeting cognitive goals.  Pt was seated upright in recliner upon arrival, awake, alert, and agreeable to participate in ST.  SLP facilitated the session with a basic task targeting visual scanning and sustained attention with pt requiring overall min assist verbal and visual cues to complete task.  Additionally, pt was >80% accurate for verbal reasoning and problem solving when identifying problems in pictures with min assist question cues.  Pt was noted with improved awareness of cognitive deficits during today's therapy session and was able to identify memory changes resulting from this acute stroke with overall min assist question cues.  Upon return to room, pt benefited from supervision instructional cues to apply wheelchair brakes prior to transferring to recliner as well as to recall use of quick release belt for safety precautions.  Left upright in room with quick release belt donned; tray set up provided for lunch meal tray.  Continue per current plan of care.    FIM:  Comprehension Comprehension Mode: Auditory Comprehension: 5-Follows basic conversation/direction: With extra  time/assistive device Expression Expression Mode: Verbal Expression: 5-Expresses basic needs/ideas: With extra time/assistive device Social Interaction Social Interaction: 4-Interacts appropriately 75 - 89% of the time - Needs redirection for appropriate language or to initiate interaction. Problem Solving Problem Solving: 3-Solves basic 50 - 74% of the time/requires cueing 25 - 49% of the time Memory Memory: 2-Recognizes or recalls 25 - 49% of the time/requires cueing 51 - 75% of the time  Pain Pain Assessment Pain Assessment: No/denies pain  Therapy/Group: Individual Therapy  Windell Moulding, M.A. CCC-SLP  Niharika Savino, Selinda Orion 02/11/2014, 3:42 PM

## 2014-02-11 NOTE — Progress Notes (Signed)
Physical Therapy Session Note  Patient Details  Name: [name] MRN: [id number] Date of Birth: December 03, 1945  Today's Date: 02/11/2014 PT Individual Time: 1405-1505 PT Individual Time Calculation (min): 60 min   Short Term Goals: Week 1:  PT Short Term Goal 1 (Week 1): = LTGs due to anticipated LOS  Skilled Therapeutic Interventions/Progress Updates:   Pt received on toilet, handoff from RN tech. Pt ambulated from bathroom to arm chair with min A. Seated in chair, pt attempted to don shoes by bending forward and by crossing legs, requires assist from therapist to don shoes and tie laces. Gait training 2 x 150 ft using RW, supervision-min guard. Pt performed car transfer with supervision, total cues for sequencing and technique. Pt performed sit <> stand x 5 from mat for LE strengthening and endurance. Pt negotiated up/down 5 stairs x 2 with 1-2 rails, min guard and total verbal/tactile cues for sequencing, technique, and attending to L hand. Pt performed NuStep using BLEs only Level 4-6 x total 10 min, total cues to attend to L foot and to locate time on L side of machine information. Pt requires total assist for attending to L side for obstacle negotiation, RW management, gait, and balance with no evidence of carryover for compensatory strategies for L neglect within session. Pt left seated in recliner with QRB on, all needs within reach.   Therapy Documentation Precautions:  Precautions Precautions: Fall Precaution Comments: L inattention Restrictions Weight Bearing Restrictions: No Vital Signs: Therapy Vitals Temp: 97.7 F (36.5 C) Temp src: Oral Pulse Rate: 56 Resp: 18 BP: 167/69 mmHg Patient Position (if appropriate): Sitting Oxygen Therapy SpO2: 97 % O2 Device: None (Room air) Pain: Pain Assessment Pain Assessment: No/denies pain Pain Score: 4  Pain Type: Chronic pain Pain Location: Back Pain Orientation: Lower Pain Onset: On-going  See FIM for current functional  status  Therapy/Group: Individual Therapy  Laretta Alstrom 02/11/2014, 4:13 PM

## 2014-02-11 NOTE — IPOC Note (Signed)
Overall Plan of Care [facility]) Patient Details Name: [name] MRN: [id number] DOB: [birth year]  Admitting Diagnosis: R THALAMIC HEMORRHAGE  Hospital Problems: Active Problems:   ICH (intracerebral hemorrhage)     Functional Problem List: Nursing Bladder;Bowel;Safety;Skin Integrity;Pain  PT Balance;Behavior;Endurance;Motor;Perception;Safety;Sensory  OT Balance;Cognition;Endurance;Motor;Safety;Vision;Sensory  SLP Cognition  TR         Basic ADL's: OT Grooming;Bathing;Dressing;Toileting     Advanced  ADL's: OT  (husband performs)     Transfers: PT Bed Mobility;Bed to Chair;Car;Furniture  OT Toilet;Tub/Shower     Locomotion: PT Ambulation;Wheelchair Mobility;Stairs     Additional Impairments: OT  (L UE strengthening)  SLP Social Cognition   Social Interaction;Problem Solving;Memory;Attention;Awareness  TR      Anticipated Outcomes Item Anticipated Outcome  Self Feeding    Swallowing      Basic self-care  S overall  Toileting  S overall   Bathroom Transfers S overall  Bowel/Bladder  Continue continent to bowel and bladder  Transfers  supervision  Locomotion  supervision  Communication     Cognition  Supervision   Pain  pain level 2 on scale 1 to 10  Safety/Judgment  pt. will be free from falls during her stay in rehab   Therapy Plan: PT Intensity: Minimum of 1-2 x/day ,45 to 90 minutes PT Frequency: 5 out of 7 days PT Duration Estimated Length of Stay: 7 days OT Intensity: Minimum of 1-2 x/day, 45 to 90 minutes OT Frequency: 5 out of 7 days OT Duration/Estimated Length of Stay: 7 days SLP Intensity: Minumum of 1-2 x/day, 30 to 90 minutes SLP Frequency: 5 out of 7 days SLP Duration/Estimated Length of Stay: 14 days       Team Interventions: Nursing Interventions Patient/Family Education;Bladder Management;Bowel Management;Pain Management;Disease Management/Prevention;Medication Management  PT interventions Ambulation/gait  training;Balance/vestibular training;Discharge planning;DME/adaptive equipment instruction;Functional mobility training;Neuromuscular re-education;Patient/family education;Stair training;Therapeutic Activities;Therapeutic Exercise;UE/LE Strength taining/ROM;UE/LE Coordination activities;Wheelchair propulsion/positioning;Pain management;Disease management/prevention  OT Interventions Balance/vestibular training;Discharge planning;Cognitive Paramedic;Functional mobility training;Pain management;Psychosocial support;Therapeutic Activities;UE/LE Strength taining/ROM;Visual/perceptual remediation/compensation;UE/LE Coordination activities;Therapeutic Exercise;Self Care/advanced ADL retraining;Patient/family education;Neuromuscular re-education  SLP Interventions Cognitive remediation/compensation;Cueing hierarchy;Environmental controls;Functional tasks;Internal/external aids;Medication managment;Patient/family education;Therapeutic Activities  TR Interventions    SW/CM Interventions Discharge Planning;Psychosocial Support;Patient/Family Education    Team Discharge Planning: Destination: PT-Home ,OT- Home , SLP-Home Projected Follow-up: PT-Home health PT, OT-  24 hour supervision/assistance, SLP-24 hour supervision/assistance;Outpatient SLP Projected Equipment Needs: PT-To be determined, OT- 3 in 1 bedside comode;Tub/shower bench, SLP-None recommended by SLP Equipment Details: PT- , OT-has cane Patient/family involved in discharge planning: PT- Patient,  OT-Patient;Family member/caregiver, SLP-Patient;Family member/caregiver  MD ELOS: 7d Medical Rehab Prognosis:  Good Assessment: 68 y.o. right-handed female with history of GERD, HTN--no medications X 2 years; who was admitted on 02/02/14 with sudden onset of left sided weakness, facial droop as well as speech difficulty as well as HA. BP at admission 192/104. CT head with right thalamic hemorrhage likely  due to HTN with blood in right lateral ventricle and was started on Cardene drip. Patient did not receive TPA. Carotid dopplers with 1-39% ICS stenosis. 2D echo with EF 65-70%, calcified MV and grade 1 diastolic dysfunction. Follow up CCT with stable hemorrhage  Now requiring 24/7 Rehab RN,MD, as well as CIR level PT, OT and SLP.  Treatment team will focus on ADLs and mobility with goals set at Supervision    See Team Conference Notes for weekly updates to the plan of care

## 2014-02-12 ENCOUNTER — Inpatient Hospital Stay (HOSPITAL_COMMUNITY): Payer: Medicare HMO | Admitting: Speech Pathology

## 2014-02-12 ENCOUNTER — Inpatient Hospital Stay (HOSPITAL_COMMUNITY): Payer: Medicare HMO | Admitting: Occupational Therapy

## 2014-02-12 ENCOUNTER — Inpatient Hospital Stay (HOSPITAL_COMMUNITY): Payer: Medicare HMO | Admitting: *Deleted

## 2014-02-12 ENCOUNTER — Encounter (HOSPITAL_COMMUNITY): Payer: Medicare HMO | Admitting: Occupational Therapy

## 2014-02-12 DIAGNOSIS — G479 Sleep disorder, unspecified: Secondary | ICD-10-CM

## 2014-02-12 DIAGNOSIS — I619 Nontraumatic intracerebral hemorrhage, unspecified: Secondary | ICD-10-CM

## 2014-02-12 DIAGNOSIS — I517 Cardiomegaly: Secondary | ICD-10-CM

## 2014-02-12 DIAGNOSIS — N183 Chronic kidney disease, stage 3 unspecified: Secondary | ICD-10-CM

## 2014-02-12 DIAGNOSIS — E669 Obesity, unspecified: Secondary | ICD-10-CM

## 2014-02-12 MED ORDER — HYDROCORTISONE 2.5 % RE CREA
1.0000 "application " | TOPICAL_CREAM | Freq: Two times a day (BID) | RECTAL | Status: DC
  Administered 2014-02-12 – 2014-02-15 (×4): 1 via RECTAL
  Filled 2014-02-12 (×2): qty 28.35

## 2014-02-12 MED ORDER — METOPROLOL TARTRATE 50 MG PO TABS
50.0000 mg | ORAL_TABLET | Freq: Two times a day (BID) | ORAL | Status: DC
  Administered 2014-02-13 – 2014-02-16 (×7): 50 mg via ORAL
  Filled 2014-02-12 (×9): qty 1

## 2014-02-12 MED ORDER — FAMOTIDINE 40 MG PO TABS
40.0000 mg | ORAL_TABLET | Freq: Every day | ORAL | Status: DC
  Administered 2014-02-12 – 2014-02-15 (×4): 40 mg via ORAL
  Filled 2014-02-12 (×5): qty 1
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
## 2014-02-12 NOTE — Progress Notes (Signed)
Physical Therapy Session Note  Patient Details  Name: [name] MRN: [id number] Date of Birth: April 19, 1946  Today's Date: 02/12/2014 PT Individual Time: 1340-1425 PT Individual Time Calculation (min): 45 min     Skilled Therapeutic Interventions/Progress Updates:  Patient sitting EOB finishing her lunch ,agrees to session. Gait training with RW to and from gym with supervision and mod VC for sequencing and attention to L side. Training in gait in room with focus on maneuvering in small spaces and obstacles-mod A for L side obstacles. Bed mobility training, and transfer training with presence of family to increase education.  NuStep 7 min ,level 4 to increase reciprocal movement and increase activity tolerance.  4 Square exercise, no AD mod A, patient has difficulty with completing.  Resistive exercises performed and shown to family. Patient left in a room in a recliner with daughter in room.   Therapy Documentation Precautions:  Precautions Precautions: Fall Precaution Comments: L inattention Restrictions Weight Bearing Restrictions: No Pain: Pain Assessment Pain Assessment: No/denies pain   See FIM for current functional status  Therapy/Group: Individual Therapy  [name] 02/12/2014, 2:32 PM

## 2014-02-12 NOTE — Progress Notes (Signed)
Speech Language Pathology Daily Session Note  Patient Details  Name: [name] MRN: [id number] Date of Birth: 22-Feb-1946  Today's Date: 02/12/2014 SLP Individual Time: 2505-3976 SLP Individual Time Calculation (min): 45 min  Short Term Goals: Week 1: SLP Short Term Goal 1 (Week 1): Patient will sustain attention to fuctional task for 5 minutes with Mod cues for redirections SLP Short Term Goal 2 (Week 1): Patient will label 1 physical and 1 cognitive deficit with Mod question cues SLP Short Term Goal 3 (Week 1): Patient will initiate use of external aids to for orientation and recall with Mod question cues SLP Short Term Goal 4 (Week 1): Patient will attend to left of environment during functional tasks with Min question cues  Skilled Therapeutic Interventions: Skilled treatment session focused on cognitive-linguistic goals. Upon arrival, pt was upright in her recliner with her granddaughter present. SLP facilitated session by providing Max A multimodal cues for recall of goals of SLP intervention and events from previous SLP sessions. The patient required Mod A multimodal cues for functional problem solving and organization with a mild-moderately complex task and for recall of rules to task. The patient utilized her LUE throughout the task with supervision verbal cues and attended to clinician in left field of environment with Min A multimodal cues. The patient sustained attention to task for 30 minutes with supervision verbal cues for redirection and was oriented to situation independently. Patient left in recliner with quick release belt in place and granddaughter present. Continue with current plan of care.    FIM:  Comprehension Comprehension Mode: Auditory Comprehension: 4-Understands basic 75 - 89% of the time/requires cueing 10 - 24% of the time Expression Expression Mode: Verbal Expression: 5-Expresses basic needs/ideas: With no assist Social Interaction Social Interaction:  4-Interacts appropriately 75 - 89% of the time - Needs redirection for appropriate language or to initiate interaction. Problem Solving Problem Solving: 3-Solves basic 50 - 74% of the time/requires cueing 25 - 49% of the time Memory Memory: 2-Recognizes or recalls 25 - 49% of the time/requires cueing 51 - 75% of the time  Pain Pain Assessment Pain Assessment: No/denies pain  Therapy/Group: Individual Therapy  Ulas Zuercher 02/12/2014, 11:49 AM

## 2014-02-12 NOTE — Progress Notes (Signed)
Occupational Therapy Session Note  Patient Details  Name: [name] MRN: [id number] Date of Birth: 08-15-1945  Today's Date: 02/12/2014 OT Individual Time: [phone] and 6270-3500 OT Individual Time Calculation (min): 60 min and 30   Short Term Goals: Week 1:  OT Short Term Goal 1 (Week 1): STG = LTG secondary to short ELOS  Skilled Therapeutic Interventions/Progress Updates:  Session 1 ([phone])- Pt with no c/o pain this session. Pt with granddaughter present in room. OT session with focus on ADL retraining, L inattention, dynamic standing balance, functional transfers/mobility, and energy conservation. Pt ambulating to bathroom with use of RW and steady assist. Pt requiring cues to advance RW with body and feet inside. L foot left outside of walker and always turned to the side. Once therapist providing verbal guidance cues pt able to correct foot placement and advance RW. Pt performed tub transfer onto bench with steady assist. Bathing with supervision Min A this session as pt standing in shower to wash buttocks and unable to follow previous cues to not stand but to perform lateral leans. Pt performed dressing seated on EOB with set up A for UB and steady assist for dynamic standing balance during LB dressing. Pt requiring rest breaks secondary to fatigue. Grooming performed seated in chair at sink side. Upon exiting the room, pt seated in chair with quick release belt on for safety and all needed items within reach.   Session 2 ([id number])- Pt with no c/o pain this session. Pt received seated in recliner chair with multiple family members present in room. Pt requesting to go to the bathroom for possible bowel movement. Pt ambulated to bathroom with verbal cues to stay within RW and steady assist for safety. Pt performed toileting and toilet transfer onto elevated toilet seat with steady assist. Pt requiring balance assistance during clothing management. OT educated pt and Daughter on  risk factors for recurrent CVA this session such as things pt could and could not change. Pt verbalized understanding. Pt ambulated to sit in recliner chair secondary to fatigue after toileting. OT able to demonstrated and educate patients daughter on tub transfer and use of DME as well as where to purchase items locally. Upon exiting the room, pt seated in recliner chair with family present and all needed items within reach.   Therapy Documentation Precautions:  Precautions Precautions: Fall Precaution Comments: L inattention Restrictions Weight Bearing Restrictions: No Pain: Pain Assessment Pain Assessment: No/denies pain  See FIM for current functional status  Therapy/Group: Individual Therapy  Phineas Semen 02/12/2014, 12:32 PM

## 2014-02-13 ENCOUNTER — Inpatient Hospital Stay (HOSPITAL_COMMUNITY): Payer: Medicare HMO | Admitting: Occupational Therapy

## 2014-02-13 ENCOUNTER — Inpatient Hospital Stay (HOSPITAL_COMMUNITY): Payer: Medicare HMO

## 2014-02-13 DIAGNOSIS — R42 Dizziness and giddiness: Secondary | ICD-10-CM

## 2014-02-13 DIAGNOSIS — K2100 Gastro-esophageal reflux disease with esophagitis, without bleeding: Secondary | ICD-10-CM

## 2014-02-13 NOTE — Progress Notes (Signed)
At 2230 C/O left upper chest "pain". Felt that she was unable to catch her breath. O2 sats 95% RA.  No respiratory distress observed. Couple episodes of coughing. PRN maalox given with "some" relief. Encouraged TC& DB. Paged Dr. Inda Merlin, EKG ordered. Results called back to doctor, no new orders at this time, except to monitor. Currently resting comfortably. [name] A

## 2014-02-13 NOTE — Progress Notes (Signed)
HS BP=131/45, HR-55. Paged Dr. Alain Marion, orders to hold tonights dose of lopressor and decrease dose to 50mg . [name] A

## 2014-02-13 NOTE — Progress Notes (Signed)
Occupational Therapy Session Note  Patient Details  Name: [name] MRN: [id number] Date of Birth: 05-11-1946  Today's Date: 02/13/2014 OT Individual Time: 1305-1350 OT Individual Time Calculation (min): 45 min    Short Term Goals: Week 1:  OT Short Term Goal 1 (Week 1): STG = LTG secondary to short ELOS  Skilled Therapeutic Interventions/Progress Updates:    Patient seen this pm for OT intervention to address safety, left sided attention, balance, and functional mobility.  Patient sleeping soundly upon OT's arrival.  Granddaughter in room.  Patient difficult to arouse.  Spoke with RN who indicated that patient sleeps poorly at night, and naps often during the day.  Patient agreeable to participate in OT session as she became alert, and indicated need to void.  Patient able to walk to bathroom with min assist using rolling walker.  Patient needing cues to avoid obstacles on left.  Patient with decreased awareness of obstacles, even when movement halted, e.g. Walker wheel caught in doorframe.  Patient with decreased problem identification,a nd therefore little ability to problem solve solutions.  Patient needed frequent cues to utilize walker safely, and step within walker.    Therapy Documentation Precautions:  Precautions Precautions: Fall Precaution Comments: L inattention Restrictions Weight Bearing Restrictions: No Pain: No report of pain See FIM for current functional status  Therapy/Group: Individual Therapy  Mariah Milling 02/13/2014, 3:39 PM

## 2014-02-13 NOTE — Progress Notes (Signed)
Physical Therapy Session Note  Patient Details  Name: [name] MRN: [id number] Date of Birth: 05-26-1946  Today's Date: 02/13/2014 PT Individual Time: 1500-1550 PT Individual Time Calculation (min): 50 min   Short Term Goals: Week 1:  PT Short Term Goal 1 (Week 1): = LTGs due to anticipated LOS     Skilled Therapeutic Interventions/Progress Updates:  Gait with RW x 160' x 2with close supervision on straight path, min assist when turning due to L inattention, narrow BOS. Pt fatigued and SOBOE , not audible, but pt needed seated rest break.  L pelvic retraction noted during gait. Mod multimodal cues for L attention, L turns, use of L hand during sit>< stand.  neuromuscular re-education via demo, tactile and VCS for pelvic dissociation and L pelvic protraction in supported sitting> unsupported sitting.  LUE awareness activity: L lateral leans onto L forearm x 10 , while head righting.  Husband arrived at end of session.  He was instructed and return demonstrated close guarding on her L side during ambulation to return to room, but needs further training on pt's L inattention, and safety issues. [name]  L pelvic retraction less pronounced during return to room. Pt stated that one of her daughters is planning to come tomorrow to therapy sessions and education completed then, for tentative d/c 8/31.    Therapy Documentation Precautions:  Precautions Precautions: Fall Precaution Comments: L inattention Restrictions Weight Bearing Restrictions: No   Pain: Pain Assessment Pain Assessment: 0-10 Pain Score: 10-Worst pain ever Pain Type: Chronic pain Pain Location: Back Pain Orientation: Lower Pain Descriptors / Indicators: Aching;Dull Pain Frequency: Intermittent Pain Onset: With Activity Patients Stated Pain Goal: 2 Pain Intervention(s): Medication (See eMAR) during tx   Locomotion : Ambulation Ambulation/Gait Assistance: 4: Min guard     See FIM for current functional  status  Therapy/Group: Individual Therapy  [name] 02/13/2014, 4:16 PM

## 2014-02-13 NOTE — Progress Notes (Signed)
[name] is a 68 y.o. female Oct 16, 1945 [id number]  Subjective: No new complaints. No new problems. Slept well. Feeling OK. Her heartburn has resolved  Objective: Vital signs in last 24 hours: Temp:  [97.5 F (36.4 C)-98.7 F (37.1 C)] 98.7 F (37.1 C) (08/30 0531) Pulse Rate:  [55-57] 57 (08/30 0531) Resp:  [18] 18 (08/30 0531) BP: (131-144)/(45-68) 137/55 mmHg (08/30 0531) SpO2:  [96 %-98 %] 96 % (08/30 0531) Weight change:  Last BM Date: 02/12/14  Intake/Output from previous day: 08/29 0701 - 08/30 0700 In: 1200 [P.O.:1200] Out: -  Last cbgs: CBG (last 3)  No results found for this basename: GLUCAP,  in the last 72 hours   Physical Exam General: No apparent distress   HEENT: not dry Lungs: Normal effort. Lungs clear to auscultation, no crackles or wheezes. Cardiovascular: Regular rate and rhythm, no edema Abdomen: S/NT/ND; BS(+) Musculoskeletal:  unchanged Neurological: No new neurological deficits Wounds: N/A    Skin: clear  Aging changes Mental state: Alert, oriented, cooperative    Lab Results: BMET    Component Value Date/Time   NA 138 02/09/2014 0415   K 4.3 02/09/2014 0415   CL 101 02/09/2014 0415   CO2 24 02/09/2014 0415   GLUCOSE 105* 02/09/2014 0415   BUN 29* 02/09/2014 0415   CREATININE 1.34* 02/09/2014 0415   CREATININE 1.27* 04/08/2012 1109   CALCIUM 9.4 02/09/2014 0415   GFRNONAA 40* 02/09/2014 0415   GFRNONAA 50* 03/04/2012 1644   GFRAA 46* 02/09/2014 0415   GFRAA 58* 03/04/2012 1644   CBC    Component Value Date/Time   WBC 6.3 02/09/2014 0415   RBC 4.79 02/09/2014 0415   HGB 13.2 02/09/2014 0415   HCT 40.9 02/09/2014 0415   PLT 252 02/09/2014 0415   MCV 85.4 02/09/2014 0415   MCH 27.6 02/09/2014 0415   MCHC 32.3 02/09/2014 0415   RDW 13.6 02/09/2014 0415   LYMPHSABS 2.1 02/09/2014 0415   MONOABS 0.6 02/09/2014 0415   EOSABS 0.2 02/09/2014 0415   BASOSABS 0.1 02/09/2014 0415    Studies/Results: No results found.  Medications: I have  reviewed the patient's current medications.  Assessment/Plan:  1. 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 DVT Prophylaxis/Anticoagulation: Subcutaneous heparin initiated 02/04/2014. Monitor for any bleeding episodes  3. Pain Management: Tylenol as needed  4. Hypertension. Norvasc 10 mg daily, Lopressor 75 mg twice a day. Monitor with increased mobility  5. Neuropsych: This patient is capable of making decisions on her own behalf.  6. Skin/Wound Care: Routine skin checks  7. Hyperlipidemia. Lipitor  8. GERD. Protonix po. Added Pepcid at hs - better 9. Peripheral neuropathy: ?etiology---may be related to prior [facility] abuse  -low dose gabapentin helped     Length of stay, days: 5  Walker Kehr , MD 02/13/2014, 8:00 AM

## 2014-02-14 ENCOUNTER — Encounter (HOSPITAL_COMMUNITY): Payer: Medicare HMO | Admitting: Occupational Therapy

## 2014-02-14 ENCOUNTER — Inpatient Hospital Stay (HOSPITAL_COMMUNITY): Payer: Medicare HMO

## 2014-02-14 ENCOUNTER — Inpatient Hospital Stay (HOSPITAL_COMMUNITY): Payer: Medicare HMO | Admitting: Speech Pathology

## 2014-02-14 ENCOUNTER — Inpatient Hospital Stay (HOSPITAL_COMMUNITY): Payer: Medicare HMO | Admitting: Physical Therapy

## 2014-02-14 DIAGNOSIS — Z5189 Encounter for other specified aftercare: Secondary | ICD-10-CM

## 2014-02-14 DIAGNOSIS — I10 Essential (primary) hypertension: Secondary | ICD-10-CM

## 2014-02-14 DIAGNOSIS — I619 Nontraumatic intracerebral hemorrhage, unspecified: Secondary | ICD-10-CM

## 2014-02-14 NOTE — Progress Notes (Signed)
Occupational Therapy Session Note  Patient Details  Name: [name] MRN: [id number] Date of Birth: 1946-04-03  Today's Date: 02/14/2014 OT Individual Time: 0900-0950 OT Individual Time Calculation (min): 50 min    Short Term Goals: Week 1:  OT Short Term Goal 1 (Week 1): STG = LTG secondary to short ELOS  Skilled Therapeutic Interventions/Progress Updates:    Engaged in ADL retraining with focus on safety awareness and attention to Lt side during functional mobility and self-care tasks of bathing and dressing.  Pt ambulated to toilet with RW with supervision and min verbal cues to attend to Lt as pt bumped into doorframe on Lt with RW with no awareness.  Unable to locate shower to Lt of toilet, ambulated back out in to room in search of shower.  Mod verbal cues for attention to Lt to locate shower.  Bathing completed at sit > stand level with min cues for safety when standing and encouragement to only stand to wash periarea.  Dressing completed seated at EOB with setup assist and cues to scan to Lt to obtain clothing.  [name] and granddaughters present throughout session, educating on supervision with all mobility and self-care tasks.  [name] reports she will be there during the day.  Therapy Documentation Precautions:  Precautions Precautions: Fall Precaution Comments: L inattention Restrictions Weight Bearing Restrictions: No General:   Vital Signs: Therapy Vitals Pulse Rate: 60 BP: 148/76 mmHg Pain:  Pt with no c/o pain  See FIM for current functional status  Therapy/Group: Individual Therapy  Simonne Come 02/14/2014, 12:09 PM

## 2014-02-14 NOTE — Progress Notes (Signed)
Physical Therapy Session Note  Patient Details  Name: [name] MRN: [id number] Date of Birth: 1946/05/28  Today's Date: 02/14/2014 PT Individual Time: 1000-1050 PT Individual Time Calculation (min): 50 min   Short Term Goals: Week 1:  PT Short Term Goal 1 (Week 1): = LTGs due to anticipated LOS  Skilled Therapeutic Interventions/Progress Updates:   Focus on attending to L, L sided NMR, family education, gait and transfer training. Pt received seated EOB, daughter in room and RN/CSW present. Gait training using RW 2 x 100 ft with supervision, max cues for attending to L. [name] able to appropriately state, "It's like her L side isn't there," when asked to define what her mom's deficits are. Pt and daughter educated on L neglect/inattention and compensatory strategies for family to cue patient for improved safety. Pt ambulates with significantly slow gait speed but able to increase speed with verbal cues in safe manner. Pt negotiated up/down 5 stairs using 2 rails x 2 with supervision, mod cues for attending to LUE on rail and to get foot completely on step before stepping with step-to pattern. Pt requires cues to turn completely before sitting down as she tends to turn R side of body only throughout session, sitting down at unsafe angle on mat/chair. Pt performed sit <> stand from mat x 10 without UE support. Pt negotiated obstacle course including over thresholds, negotiating between cones, and up/down step using RW x 2, max cues for attending to L side, safe use of RW, and for sequencing stepping with RW. With focus on attending to L, pt ambulated 200 ft using RW and supervision with therapeutic task of locating yellow post-it notes placed at eye level on left wall. Pt able to locate all 5 and take off wall with LUE. Pt requires max verbal cues for returning L hand to RW after grasping post-it note as she tends to reach around in the air instead of looking at L side to find RW and for  staying in middle of walker as walker tends to drift to R while patient leaves L foot outside walker. Pt left seated EOB with family present.   Therapy Documentation Precautions:  Precautions Precautions: Fall Precaution Comments: L inattention Restrictions Weight Bearing Restrictions: No Vital Signs: Therapy Vitals Pulse Rate: 60 BP: 148/76 mmHg Pain:  Denies pain  See FIM for current functional status  Therapy/Group: Individual Therapy  Laretta Alstrom 02/14/2014, 10:51 AM

## 2014-02-14 NOTE — Plan of Care (Signed)
Problem: RH SAFETY Goal: RH STG ADHERE TO SAFETY PRECAUTIONS W/ASSISTANCE/DEVICE STG Adhere to Safety Precautions With Assistance/Device. Supervision  Outcome: Progressing No unsafe behavior. Calls appropriately.

## 2014-02-14 NOTE — Plan of Care (Cosign Needed)
Problem: Food- and Nutrition-Related Knowledge Deficit (NB-1.1) Goal: Nutrition education Formal process to instruct or train a patient/client in a skill or to impart knowledge to help patients/clients voluntarily manage or modify food choices and eating behavior to maintain or improve health. Outcome: Completed/Met Date Met:  02/14/14 Nutrition Education Note  RD consulted per RN for nutrition education regarding a Heart Healthy diet.   Lipid Panel     Component Value Date/Time    CHOL 195 02/03/2014 1050    TRIG 87 02/03/2014 1050    HDL 42 02/03/2014 1050    CHOLHDL 4.6 02/03/2014 1050    VLDL 17 02/03/2014 1050    LDLCALC 136* 02/03/2014 1050    RD provided "Heart Healthy Nutrition Therapy" handout from the Academy of [employer]. Reviewed patient's dietary recall. Provided examples on ways to decrease sodium and fat intake in diet. Discouraged intake of processed foods and use of salt shaker. Encouraged fresh fruits and vegetables as well as whole grain sources of carbohydrates to maximize fiber intake. Discussed adequate hydration and fluid intake.Teach back method used.  Expect good compliance.  Body mass index is 38.24 kg/(m^2). Pt meets criteria for class II obesity based on current BMI.  Current diet order is heart, patient is consuming approximately 100% of meals at this time. Labs and medications reviewed. No further nutrition interventions warranted at this time. RD contact information provided. If additional nutrition issues arise, please consult RD.  Kallie Locks, MS, Provisional LDN Pager # [phone] After hours/ weekend pager # [phone]

## 2014-02-14 NOTE — Progress Notes (Signed)
Speech Language Pathology Daily Session Note  Patient Details  Name: [name] MRN: [id number] Date of Birth: 09/07/1945  Today's Date: 02/14/2014 SLP Individual Time: 1100-1200 SLP Individual Time Calculation (min): 60 min  Short Term Goals: Week 1: SLP Short Term Goal 1 (Week 1): Patient will sustain attention to fuctional task for 5 minutes with Mod cues for redirections SLP Short Term Goal 2 (Week 1): Patient will label 1 physical and 1 cognitive deficit with Mod question cues SLP Short Term Goal 3 (Week 1): Patient will initiate use of external aids to for orientation and recall with Mod question cues SLP Short Term Goal 4 (Week 1): Patient will attend to left of environment during functional tasks with Min question cues  Skilled Therapeutic Interventions:  Pt was seen for skilled speech therapy targeting goals for cognition.  Upon arrival, pt was partially reclined in bed with complaints of fatigue due to not sleeping well last night.  Pt independently recalled at least one detail from previous therapy session and was noted to recognize SLP upon arrival.  SLP facilitated the session with a semi-complex card game targeting visual scanning, sustained attention and functional problem solving with pt requiring mod faded to min assist multimodal cues to complete for >80% accuracy.  SLP also facilitated the session with tray set up and supervision verbal cues to attend to and use left upper extremity to consume presentations of her currently prescribed diet.  SLP completed extensive education with pt and pt's daughters who were present for the duration of today's therapy session regarding pt's current goals and progress in therapy.  [name], [name], endorses that pt remains altered from her baseline cognitive status and becomes distracted easily when conversing with more than 1 communication partner.  Continue per current plan of care.    FIM:  Comprehension Comprehension Mode:  Auditory Comprehension: 5-Understands basic 90% of the time/requires cueing < 10% of the time Expression Expression Mode: Verbal Expression: 5-Expresses basic needs/ideas: With no assist Social Interaction Social Interaction: 4-Interacts appropriately 75 - 89% of the time - Needs redirection for appropriate language or to initiate interaction. Problem Solving Problem Solving: 4-Solves basic 75 - 89% of the time/requires cueing 10 - 24% of the time Memory Memory: 3-Recognizes or recalls 50 - 74% of the time/requires cueing 25 - 49% of the time FIM - Eating Eating Activity: 5: Supervision/cues  Pain Pain Assessment Pain Assessment: No/denies pain  Therapy/Group: Individual Therapy  [name], M.A. CCC-SLP  [name], [name] 02/14/2014, 12:53 PM

## 2014-02-14 NOTE — Progress Notes (Signed)
68 y.o. right-handed female with history of GERD, HTN--no medications X 2 years; who was admitted on 02/02/14 with sudden onset of left sided weakness, facial droop as well as speech difficulty as well as HA. BP at admission 192/104. 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 Patient did not receive TPA. Carotid dopplers with 1-39% ICS stenosis. 2D echo with EF 65-70%, calcified MV and grade 1 diastolic dysfunction. Follow up CCT with stable hemorrhage  Subjective/Complaints: Tired this am, states she slept ok  Review of Systems - Negative except weakness on left and constipation Objective: Vital Signs: Blood pressure 140/61, pulse 58, temperature 98.5 F (36.9 C), temperature source Oral, resp. rate 18, height 5\' 4"  (1.626 m), weight 101.1 kg (222 lb 14.2 oz), SpO2 97.00%. No results found. No results found for this or any previous visit (from the past 72 hour(s)).    Cardio: RRR and no murmur Resp: CTA B/L and unlabored GI: BS positive and NT, ND Extremity:  Pulses positive and No Edema Skin:   Intact Neuro:somnolent difficult to awaken Musc/Skel:  Normal and Other no pain with AROM left shoulder GEN NAD   Assessment/Plan: 1. 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 Physiatrist is providing close team supervision and 24 hour management of active medical problems listed below. Physiatrist and rehab team continue to assess barriers to discharge/monitor patient progress toward functional and medical goals. FIM: FIM - Bathing Bathing Steps Patient Completed: Chest;Right Arm;Left Arm;Abdomen;Front perineal area;Buttocks;Right upper leg;Left upper leg;Right lower leg (including foot);Left lower leg (including foot) Bathing: 4: Min-Patient completes 8-9 3f 10 parts or 75+ percent  FIM - Upper  Body Dressing/Undressing Upper body dressing/undressing steps patient completed: Thread/unthread right bra strap;Thread/unthread left bra strap;Hook/unhook bra;Thread/unthread right sleeve of pullover shirt/dresss;Thread/unthread left sleeve of pullover shirt/dress;Put head through opening of pull over shirt/dress;Pull shirt over trunk Upper body dressing/undressing: 5: Set-up assist to: Obtain clothing/put away FIM - Lower Body Dressing/Undressing Lower body dressing/undressing steps patient completed: Thread/unthread right underwear leg;Thread/unthread left underwear leg;Don/Doff right sock;Don/Doff left sock (wearing dress this session) Lower body dressing/undressing: 4: Min-Patient completed 75 plus % of tasks  FIM - Toileting Toileting steps completed by patient: Adjust clothing prior to toileting;Performs perineal hygiene;Adjust clothing after toileting Toileting Assistive Devices: Grab bar or rail for support Toileting: 4: Steadying assist  FIM - Radio producer Devices: Insurance account manager Transfers: 4-To toilet/BSC: Min A (steadying Pt. > 75%);4-From toilet/BSC: Min A (steadying Pt. > 75%)  FIM - Bed/Chair Transfer Bed/Chair Transfer Assistive Devices: Arm rests;Walker Bed/Chair Transfer: 5: Supine > Sit: Supervision (verbal cues/safety issues);5: Sit > Supine: Supervision (verbal cues/safety issues);4: Bed > Chair or W/C: Min A (steadying Pt. > 75%);4: Chair or W/C > Bed: Min A (steadying Pt. > 75%)  FIM - Locomotion: Wheelchair Distance: 30 ft Locomotion: Wheelchair: 0: Activity did not occur FIM - Locomotion: Ambulation Locomotion: Ambulation Assistive Devices: Administrator Ambulation/Gait Assistance: 4: Min guard Locomotion: Ambulation: 4: Travels 150 ft or more with minimal assistance (Pt.>75%)  Comprehension Comprehension Mode: Auditory Comprehension: 4-Understands basic 75 - 89% of the time/requires cueing 10 - 24% of the  time  Expression Expression Mode: Verbal Expression: 5-Expresses basic needs/ideas: With no assist  Social Interaction Social Interaction: 4-Interacts appropriately 75 - 89% of the time - Needs redirection for appropriate language or to initiate  interaction.  Problem Solving Problem Solving: 3-Solves basic 50 - 74% of the time/requires cueing 25 - 49% of the time  Memory Memory: 2-Recognizes or recalls 25 - 49% of the time/requires cueing 51 - 75% of the time  Medical Problem List and Plan:   1. 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 DVT Prophylaxis/Anticoagulation: Subcutaneous heparin initiated 02/04/2014. Monitor for any bleeding episodes   3. Pain Management: Tylenol as needed   4. Hypertension. Norvasc 10 mg daily, Lopressor 75 mg twice a day. Monitor with increased mobility   5. Neuropsych: This patient is capable of making decisions on her own behalf.   6. Skin/Wound Care: Routine skin checks   7. Hyperlipidemia. Lipitor   8. GERD. Protonix   9. Peripheral neuropathy: ?etiology---may be related to prior [facility] abuse             -low dose gabapentin   LOS (Days) 6 A FACE TO FACE EVALUATION WAS PERFORMED  Soni Kegel E 02/14/2014, 7:01 AM

## 2014-02-14 NOTE — Progress Notes (Signed)
At 0130, ambulated to bathroom, without further complaint of. Will continue to monitor. [name] A

## 2014-02-14 NOTE — Progress Notes (Signed)
Physical Therapy Session Note  Patient Details  Name: [name] MRN: [id number] Date of Birth: 1945-09-13  Today's Date: 02/14/2014 PT Individual Time: 1303-1333 PT Individual Time Calculation (min): 30 min   Short Term Goals: Week 1:  PT Short Term Goal 1 (Week 1): = LTGs due to anticipated LOS  Skilled Therapeutic Interventions/Progress Updates:    Pt received seated in recliner, agreeable to participate in therapy. Session focused on attention to L side. Pt ambulated 150' w/ 1 short standing rest break w/ RW and MinA for managing RW. Therapist guarded on L side and engaged pt in conversation. Pt able to easily attend to conversation on L side, but difficulty following clues to find pictures on wall on L side of hallway. Pt engaged in seated tasks w/ ring toss to L, stacking cones from floor to table on L, and ball toss w/ granddaughter to L side. Noted pt easily attended to L side in seated position, more difficulty attending to L side while upright and ambulating. Pt ambulated 150' back to room w/ 1 seated rest break. Pt left seated in recliner w/ family present w/ all needs within reach.  Therapy Documentation Precautions:  Precautions Precautions: Fall Precaution Comments: L inattention Restrictions Weight Bearing Restrictions: No General:   Vital Signs: Therapy Vitals Pulse Rate: 60 BP: 148/76 mmHg Pain: Pain Assessment Pain Assessment: No/denies pain Pain Score: 0-No pain Mobility:   Locomotion : Ambulation Ambulation/Gait Assistance: 5: Supervision  Trunk/Postural Assessment :    Balance:   Exercises:   Other Treatments:    See FIM for current functional status  Therapy/Group: Individual Therapy  Rada Hay Rada Hay, PT, DPT 02/14/2014, 1:40 PM

## 2014-02-15 ENCOUNTER — Inpatient Hospital Stay (HOSPITAL_COMMUNITY): Payer: Medicare HMO | Admitting: Occupational Therapy

## 2014-02-15 ENCOUNTER — Inpatient Hospital Stay (HOSPITAL_COMMUNITY): Payer: Medicare HMO | Admitting: Speech Pathology

## 2014-02-15 ENCOUNTER — Encounter (HOSPITAL_COMMUNITY): Payer: Medicare HMO | Admitting: Occupational Therapy

## 2014-02-15 ENCOUNTER — Inpatient Hospital Stay (HOSPITAL_COMMUNITY): Payer: Medicare HMO | Admitting: Physical Therapy

## 2014-02-15 DIAGNOSIS — I619 Nontraumatic intracerebral hemorrhage, unspecified: Secondary | ICD-10-CM

## 2014-02-15 DIAGNOSIS — I10 Essential (primary) hypertension: Secondary | ICD-10-CM

## 2014-02-15 DIAGNOSIS — Z5189 Encounter for other specified aftercare: Secondary | ICD-10-CM

## 2014-02-15 NOTE — Progress Notes (Signed)
Speech Language Pathology Discharge Summary  Patient Details  Name: [name] MRN: [id number] Date of Birth: 04-04-1946  Today's Date: 02/15/2014 SLP Individual Time: 1030-1130 SLP Individual Time Calculation (min): 60 min   Skilled Therapeutic Interventions:  Pt was seen for skilled speech therapy targeting cognitive goals.  Pt was received in therapy gym from PT treatment session.  Pt ambulated to ADL kitchen with min cues from SLP to scan to the left to avoid obstacles.  SLP facilitated the session with a structured kitchen task targeting working memory, planning, safety awareness, organization, and functional problem solving with pt requiring overall min assist to complete task.  Pt utilized an external organizational aid for time management when completing task with min assist instructional cues.  During task, pt benefited from cuing for placement of walker as she continues with decreased awareness of her left side.  Upon completion of task, pt required min assist for recall of way finding from ADL kitchen to her room with use of environmental aids.  Pt to discharge tomorrow.  All pt's and family's questions are answered at this time.     Patient has met 3 of 4 long term goals.  Patient to discharge at overall Min;Supervision level.  Reasons goals not met: pt continues to require min assist for recall of daily information which is adequate for discharge given that pt will have 24/7 supervision and assistance for medication and financial  management   Clinical Impression/Discharge Summary:  Pt made functional gains while inpatient and is discharging having met 3 out of 4 long term goals due to improved attention, awareness, and problem solving.  Currently pt requires overall supervision-min assist for cognitive tasks due to left inattention and memory impairments.  Pt is to discharge home with family who will provide 24/7 supervision and assistance for medication and financial management.   Pt would also benefit from follow up speech therapy services upon discharge to maximize functional independence and reduce burden of care upon discharge.  Pt and family education is complete at this time.    Care Partner:  Caregiver Able to Provide Assistance: Yes  Type of Caregiver Assistance: Physical;Cognitive  Recommendation:  24 hour supervision/assistance;Outpatient SLP  Rationale for SLP Follow Up: Maximize functional communication;Maximize cognitive function and independence;Reduce caregiver burden   Equipment: none recommended by SLP   Reasons for discharge: Discharged from hospital   Patient/Family Agrees with Progress Made and Goals Achieved: Yes   See FIM for current functional status  Windell Moulding, M.A. CCC-SLP  Coty Larsh, Selinda Orion 02/15/2014, 4:04 PM

## 2014-02-15 NOTE — Discharge Summary (Signed)
Discharge summary job [id number]

## 2014-02-15 NOTE — Progress Notes (Signed)
68 y.o. right-handed female with history of GERD, HTN--no medications X 2 years; who was admitted on 02/02/14 with sudden onset of left sided weakness, facial droop as well as speech difficulty as well as HA. BP at admission 192/104. 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 Patient did not receive TPA. Carotid dopplers with 1-39% ICS stenosis. 2D echo with EF 65-70%, calcified MV and grade 1 diastolic dysfunction. Follow up CCT with stable hemorrhage  Subjective/Complaints: "when am I going home?"  No pain c/os, slept ok  Review of Systems - Negative except weakness on left and constipation Objective: Vital Signs: Blood pressure 142/62, pulse 52, temperature 98.2 F (36.8 C), temperature source Oral, resp. rate 19, height 5\' 4"  (1.626 m), weight 101.1 kg (222 lb 14.2 oz), SpO2 92.00%. No results found. No results found for this or any previous visit (from the past 72 hour(s)).    Cardio: RRR and no murmur Resp: CTA B/L and unlabored GI: BS positive and NT, ND Extremity:  Pulses positive and No Edema Skin:   Intact Neuro:4/5 strength on left 5/5 on right, reduced sensation LUE Musc/Skel:  Normal and Other no pain with AROM left shoulder GEN NAD   Assessment/Plan: 1. 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 Physiatrist is providing close team supervision and 24 hour management of active medical problems listed below. Physiatrist and rehab team continue to assess barriers to discharge/monitor patient progress toward functional and medical goals. FIM: FIM - Bathing Bathing Steps Patient Completed: Chest;Right Arm;Left Arm;Abdomen;Front perineal area;Buttocks;Right upper leg;Left upper leg;Right lower leg (including foot);Left lower leg (including foot) Bathing: 5: Supervision: Safety  issues/verbal cues  FIM - Upper Body Dressing/Undressing Upper body dressing/undressing steps patient completed: Thread/unthread right bra strap;Thread/unthread left bra strap;Thread/unthread right sleeve of pullover shirt/dresss;Thread/unthread left sleeve of pullover shirt/dress;Put head through opening of pull over shirt/dress;Pull shirt over trunk Upper body dressing/undressing: 5: Set-up assist to: Obtain clothing/put away FIM - Lower Body Dressing/Undressing Lower body dressing/undressing steps patient completed: Thread/unthread right underwear leg;Thread/unthread left underwear leg;Don/Doff right sock;Don/Doff left sock;Pull underwear up/down;Don/Doff right shoe;Don/Doff left shoe;Fasten/unfasten right shoe;Fasten/unfasten left shoe Lower body dressing/undressing: 5: Set-up assist to: Obtain clothing  FIM - Toileting Toileting steps completed by patient: Adjust clothing prior to toileting;Performs perineal hygiene;Adjust clothing after toileting Toileting Assistive Devices: Grab bar or rail for support Toileting: 5: Supervision: Safety issues/verbal cues  FIM - Radio producer Devices: Insurance account manager Transfers: 5-To toilet/BSC: Supervision (verbal cues/safety issues);5-From toilet/BSC: Supervision (verbal cues/safety issues)  FIM - Engineer, site Assistive Devices: Arm rests;Walker Bed/Chair Transfer: 5: Bed > Chair or W/C: Supervision (verbal cues/safety issues);5: Chair or W/C > Bed: Supervision (verbal cues/safety issues)  FIM - Locomotion: Wheelchair Distance: 30 ft Locomotion: Wheelchair: 0: Activity did not occur FIM - Locomotion: Ambulation Locomotion: Ambulation Assistive Devices: Administrator Ambulation/Gait Assistance: 5: Supervision Locomotion: Ambulation: 5: Travels 150 ft or more with supervision/safety issues  Comprehension Comprehension Mode: Auditory Comprehension: 5-Follows basic conversation/direction: With  extra time/assistive device  Expression Expression Mode: Verbal Expression: 5-Expresses basic needs/ideas: With extra time/assistive device  Social Interaction Social Interaction: 5-Interacts appropriately 90% of the time - Needs monitoring or encouragement for participation or interaction.  Problem Solving Problem Solving: 4-Solves basic 75 - 89% of the time/requires cueing 10 - 24% of the time  Memory Memory:  4-Recognizes or recalls 75 - 89% of the time/requires cueing 10 - 24% of the time  Medical Problem List and Plan:   1. 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 DVT Prophylaxis/Anticoagulation: Subcutaneous heparin initiated 02/04/2014. Monitor for any bleeding episodes   3. Pain Management: Tylenol as needed   4. Hypertension. Norvasc 10 mg daily, Lopressor 75 mg twice a day. Monitor with increased mobility   5. Neuropsych: This patient is capable of making decisions on her own behalf.   6. Skin/Wound Care: Routine skin checks   7. Hyperlipidemia. Lipitor   8. GERD. Protonix   9. Peripheral neuropathy: ?etiology---may be related to prior [facility] abuse             -low dose gabapentin   LOS (Days) 7 A FACE TO FACE EVALUATION WAS PERFORMED  KIRSTEINS,ANDREW E 02/15/2014, 7:12 AM

## 2014-02-15 NOTE — Progress Notes (Signed)
Occupational Therapy Session Note  Patient Details  Name: GAYNA BRADDY MRN: [id number] Date of Birth: 01/24/1946  Today's Date: 02/15/2014 OT Individual Time: 5797-2820 OT Individual Time Calculation (min): 61 min    Short Term Goals: Week 1:  OT Short Term Goal 1 (Week 1): STG = LTG secondary to short ELOS  Skilled Therapeutic Interventions/Progress Updates:    Pt overall supervision level for selfcare tasks at this time.  She continues to need mod instructional cueing for positioning of the LUE on the walker with transfers as well as max instructional cueing to use the walker safely.  With turns she ends up outside of the walker with the LLE and needs cueing to stay inside.  Still with significant visual field deficit on the left as well which pt is not aware of.  Discussed with pt's daughter the need for supervision with all transfers and selfcare secondary to safety concerns.   Therapy Documentation Precautions:  Precautions Precautions: Fall Precaution Comments: L inattention Restrictions Weight Bearing Restrictions: No  Vital Signs: Therapy Vitals BP: 142/70 mmHg Pain: Pain Assessment Pain Assessment: No/denies pain ADL: See FIM for current functional status  Therapy/Group: Individual Therapy  Yovanna Cogan OTR/L 02/15/2014, 9:58 AM

## 2014-02-15 NOTE — Progress Notes (Signed)
Physical Therapy Discharge Summary  Patient Details  Name: [name] MRN: [id number] Date of Birth: 1946/02/21  Today's Date: 02/15/2014 PT Individual Time: 1000-1100 PT Individual Time Calculation (min): 60 min    Patient has met 8 of 8 long term goals due to improved activity tolerance, improved balance, increased strength, ability to compensate for deficits, functional use of  left upper extremity and left lower extremity, improved attention, improved awareness and improved coordination.  Patient to discharge at an ambulatory level Supervision.   Patient's care partner is independent to provide the necessary physical assistance at discharge.  Reasons goals not met: NA  Recommendation:  Patient will benefit from ongoing skilled PT services in home health setting to continue to advance safe functional mobility, address ongoing impairments in attending to left, safety awareness, activity tolerance, coordination, balance, and minimize fall risk.  Equipment: RW  Reasons for discharge: treatment goals met and discharge from hospital  Patient/family agrees with progress made and goals achieved: Yes  Skilled Therapeutic Intervention Session focused on family education and hands-on training with daughter and husband/other daughter joining at end of session. Pt at overall supervision level for all functional mobility secondary to safety concerns and need for cues to attend to left. Pt performed car transfer, negotiated up/down 4 stairs using 2 rails and up/down 2 curb steps using RW to simulate home entry, furniture transfers, sit <> supine on regular bed in ADL apartment, and gait using RW 2 x 150 ft in controlled environment and x 15 ft in home environment. Berg Balance Scale administered with score of 47/56, pt and family educated on findings and that patient remains at high risk of falls due to L neglect and decreased safety awareness. Continued patient and family education regarding L  neglect and compensatory strategies/appropriate cues to increase patient attention to L side. Family demonstrated appropriate cuing throughout session. Continued education regarding importance of regularly seeing PCP after discharge as patient has 2 yr history of not going to doctor and non-compliance with medication. Family reinforcing education and patient verbalized understanding. Patient and family with no further questions regarding discharge. Patient left seated in gym with family present, handoff to SLP.   PT Discharge Precautions/Restrictions Precautions Precautions: Fall Precaution Comments: L inattention Restrictions Weight Bearing Restrictions: No Vital Signs Therapy Vitals BP: 142/70 mmHg Pain Pain Assessment Pain Assessment: No/denies pain Vision/Perception   Defer to OT d/c summary  Cognition Orientation Level: Oriented X4 Sensation Sensation Light Touch: Impaired Detail Light Touch Impaired Details: Impaired LLE;Impaired LUE Stereognosis: Not tested Hot/Cold: Appears Intact Proprioception: Not tested Coordination Gross Motor Movements are Fluid and Coordinated: Yes Fine Motor Movements are Fluid and Coordinated: No Coordination and Movement Description: decreased coordination LUE > LLE Heel Shin Test: impaired LLE Motor  Motor Motor: Hemiplegia;Abnormal postural alignment and control Motor - Skilled Clinical Observations: LUE hemiplegia  Mobility Bed Mobility Bed Mobility: Supine to Sit;Sit to Supine Supine to Sit: 5: Supervision;HOB flat Sit to Supine: 5: Supervision;HOB flat Transfers Transfers: Yes Stand Pivot Transfers: 5: Supervision Stand Pivot Transfer Details (indicate cue type and reason): verbal cues to attend to L side for safety with RW management Locomotion  Ambulation Ambulation: Yes Ambulation/Gait Assistance: 5: Supervision Ambulation Distance (Feet): 150 Feet Assistive device: Rolling walker Gait Gait: Yes Gait Pattern:  Impaired Gait Pattern: Trunk flexed;Decreased trunk rotation Gait velocity: decreased but able to increase gait speed safely with verbal cues  Stairs / Additional Locomotion Stairs: Yes Stairs Assistance: 5: Supervision Stairs Assistance Details: Verbal cues for  technique;Verbal cues for sequencing Stair Management Technique: Two rails;Forwards;Alternating pattern Number of Stairs: 4 Height of Stairs: 6 Curb: 5: Supervision;Other (comment) (with RW) Wheelchair Mobility Wheelchair Mobility: No (pt ambulatory)  Trunk/Postural Assessment  Cervical Assessment Cervical Assessment: Within Functional Limits Thoracic Assessment Thoracic Assessment: Exceptions to [facility] (kyphotic) Lumbar Assessment Lumbar Assessment: Exceptions to [facility] (decreased extensibility, history of back pain) Postural Control Protective Responses: impaired due to L neglect  Balance Balance Balance Assessed: Yes Standardized Balance Assessment Standardized Balance Assessment: Berg Balance Test Berg Balance Test Sit to Stand: Able to stand without using hands and stabilize independently Standing Unsupported: Able to stand safely 2 minutes Sitting with Back Unsupported but Feet Supported on Floor or Stool: Able to sit safely and securely 2 minutes Stand to Sit: Sits safely with minimal use of hands Transfers: Able to transfer safely, minor use of hands Standing Unsupported with Eyes Closed: Able to stand 10 seconds safely Standing Ubsupported with Feet Together: Able to place feet together independently and stand 1 minute safely From Standing, Reach Forward with Outstretched Arm: Can reach forward >12 cm safely (5") From Standing Position, Pick up Object from Floor: Able to pick up shoe, needs supervision From Standing Position, Turn to Look Behind Over each Shoulder: Turn sideways only but maintains balance Turn 360 Degrees: Able to turn 360 degrees safely but slowly Standing Unsupported, Alternately Place Feet on  Step/Stool: Able to stand independently and complete 8 steps >20 seconds Standing Unsupported, One Foot in Front: Able to plae foot ahead of the other independently and hold 30 seconds Standing on One Leg: Able to lift leg independently and hold 5-10 seconds Total Score: 47/56 Extremity Assessment  RLE Assessment RLE Assessment: Within Functional Limits (grossly 4+ to 5/5 throughout) LLE Assessment LLE Assessment: Within Functional Limits (grossly 4+ to 5/5 throughout)  See FIM for current functional status  Laretta Alstrom 02/15/2014, 12:16 PM

## 2014-02-15 NOTE — Progress Notes (Signed)
Occupational Therapy Discharge Summary  Patient Details  Name: [name] MRN: [id number] Date of Birth: [birth year]  Today's Date: 02/15/2014 OT Individual Time: 1330-1402 OT Individual Time Calculation (min): 32 min    Patient has met 10 of 10 long term goals due to improved activity tolerance, improved balance, postural control, ability to compensate for deficits, functional use of  LEFT upper extremity, improved attention, improved awareness and improved coordination.  Patient to discharge at overall Supervision level.  Patient's care partner is independent to provide the necessary physical and cognitive assistance at discharge.    Reasons goals not met: NA  Recommendation:  Patient will benefit from ongoing skilled OT services in home health setting to continue to advance functional skills in the area of BADL.  Pt continues to demonstrate decreased overall emergent awareness and decreased safety with selfcare tasks and functional transfers.  Overall she is able to perform most of these tasks at a supervision level but continues to need min to mod instructional cueing for safety secondary to left visual field deficit and decreased left side sensation.  In addition, she also still demonstrates decreased overall strength and coordination in the LUE for functional tasks which needs improvement to reach her normal baseline.  Family has been education on safe assistance with all selfcare tasks at this time.  Equipment: 3:1 and tub bench  Reasons for discharge: treatment goals met and discharge from hospital  Patient/family agrees with progress made and goals achieved: Yes  OT Discharge Precautions/Restrictions  Precautions Precautions: Fall Precaution Comments: L inattention Restrictions Weight Bearing Restrictions: No  Vital Signs Therapy Vitals Temp: 98.5 F (36.9 C) Temp src: Oral Pulse Rate: 54 Resp: 18 BP: 126/64 mmHg Patient Position (if appropriate): Sitting Oxygen  Therapy SpO2: 95 % O2 Device: None (Room air) Pain Pain Assessment Pain Assessment: No/denies pain ADL  See FIM scale for details  Vision/Perception  Vision- History Baseline Vision/History: Wears glasses Wears Glasses: Reading only Patient Visual Report: No change from baseline Vision- Assessment Vision Assessment?: Yes Eye Alignment: Within Functional Limits Ocular Range of Motion: Within Functional Limits Visual Fields: Left visual field deficit Additional Comments: Vision difficult to assess secondary to pt having difficulty following directions but do note left visual field deficit with functional tasks.  Pt at times will push her walker into chairs and door frames on the left side.  Cognition Overall Cognitive Status: Impaired/Different from baseline Arousal/Alertness: Awake/alert Orientation Level: Oriented X4 Attention: Sustained Focused Attention: Appears intact Focused Attention Impairment: Functional basic Sustained Attention: Appears intact Sustained Attention Impairment: Functional basic Selective Attention: Impaired Selective Attention Impairment: Functional basic Memory: Impaired Memory Impairment: Retrieval deficit;Decreased recall of new information Awareness: Impaired Awareness Impairment: Emergent impairment Problem Solving: Impaired Problem Solving Impairment: Functional basic;Functional complex Executive Function: Reasoning;Decision Making;Initiating;Self Monitoring;Self Correcting Decision Making: Impaired Decision Making Impairment: Functional complex Initiating: Appears intact Self Monitoring: Impaired Self Monitoring Impairment: Functional complex Self Correcting: Impaired Self Correcting Impairment: Functional complex Safety/Judgment: Impaired Comments: Pt with decreased organization noted when attempting to gather items for ADL tasks.  Pt also with decreased awareness of left sided including both the visual field as well as the LUE when  attempting to place on the RW.  Sensation Sensation Light Touch: Appears Intact Light Touch Impaired Details:  (intact in the LUE) Stereognosis: Impaired Detail Stereognosis Impaired Details: Impaired LUE Hot/Cold: Impaired Detail Hot/Cold Impaired Details: Impaired LUE Proprioception: Appears Intact Coordination Gross Motor Movements are Fluid and Coordinated: No Fine Motor Movements are Fluid and Coordinated: No  Coordination and Movement Description: Pt with decreased FM and gross motor coordination noted in the LUE during functional tasks Motor  Motor Motor: Abnormal postural alignment and control;Hemiplegia Motor - Discharge Observations: Pt still with mild hemiparesis and decreased coordination in the LUE. Mobility  Bed Mobility Bed Mobility: Supine to Sit;Sit to Supine Supine to Sit: 5: Supervision;HOB flat Sit to Supine: 5: Supervision;HOB flat Transfers Transfers: Sit to Stand;Stand to Sit Sit to Stand: 5: Supervision;With upper extremity assist;From chair/3-in-1 Sit to Stand Details: Manual facilitation for weight shifting;Verbal cues for technique Stand to Sit: 5: Supervision;To chair/3-in-1;With upper extremity assist Stand to Sit Details (indicate cue type and reason): Verbal cues for technique;Manual facilitation for weight shifting  Trunk/Postural Assessment  Cervical Assessment Cervical Assessment: Within Functional Limits Thoracic Assessment Thoracic Assessment: Exceptions to [facility] (kyphosis) Lumbar Assessment Lumbar Assessment: Exceptions to [facility] Lumbar Strength Overall Lumbar Strength Comments: Pt requires the use of UE support during mobility secondary to increased back pain in the lumbar region as well as decreased endurance.  Balance Balance Balance Assessed: Yes Static Standing Balance Static Standing - Balance Support: Right upper extremity supported;Left upper extremity supported Static Standing - Level of Assistance: 5: Stand by assistance Dynamic  Standing Balance Dynamic Standing - Balance Support: Right upper extremity supported;Left upper extremity supported;During functional activity Dynamic Standing - Level of Assistance: 5: Stand by assistance Extremity/Trunk Assessment RUE Assessment RUE Assessment: Within Functional Limits LUE Assessment LUE Assessment: Exceptions to [facility] LUE Strength LUE Overall Strength Comments: Pt demonstrates AROM WFLs for all joints but still with decreased coordination in function.  Overall strength 3+5 throughout but pt needs increased time and visual attention to use the LUE at a diminished level with ADLs.  See FIM for current functional status  Adaria Hole OTR/L 02/15/2014, 4:39 PM

## 2014-02-15 NOTE — Plan of Care (Signed)
Problem: RH Memory Goal: LTG Patient will use memory compensatory aids to (SLP) LTG: Patient will use memory compensatory aids to recall biographical/new, daily complex information with cues (SLP)  Outcome: Adequate for Discharge Pt will discharge home with 24/7 supervision and assistance for medication and financial management

## 2014-02-15 NOTE — Progress Notes (Signed)
Social Work Patient ID: [name], female   DOB: 1945/10/16, 68 y.o.   MRN: [id number] Met with pt, husband and two daughter's to discuss discharge tomorrow and her needs.  Husband wants to wait on the tub bench since it is private pay.  They have applied for medicaid and If approved this item will be covered.  Daughter has found pt a MD closer to her home and appointment has been made.  Discussed home health therapies and pt plans to work hard with them and follow up  On her MD orders.  Family education completed today and ready for discharge tomorrow.

## 2014-02-16 MED ORDER — AMLODIPINE BESYLATE 10 MG PO TABS: 10.0000 mg | ORAL_TABLET | Freq: Every day | ORAL | Status: DC

## 2014-02-16 MED ORDER — ATORVASTATIN CALCIUM 20 MG PO TABS: 20.0000 mg | ORAL_TABLET | Freq: Every day | ORAL | Status: DC

## 2014-02-16 MED ORDER — GABAPENTIN 100 MG PO CAPS
100.0000 mg | ORAL_CAPSULE | Freq: Two times a day (BID) | ORAL | Status: DC
Start: 2014-02-16 — End: 2014-04-08

## 2014-02-16 MED ORDER — FAMOTIDINE 40 MG PO TABS: 40.0000 mg | ORAL_TABLET | Freq: Every day | ORAL | Status: DC

## 2014-02-16 MED ORDER — METOPROLOL TARTRATE 50 MG PO TABS: 50.0000 mg | ORAL_TABLET | Freq: Two times a day (BID) | ORAL | Status: DC

## 2014-02-16 NOTE — Discharge Summary (Signed)
[name], [name] NO.:  [id number]  MEDICAL RECORD NO.:  [id number]  LOCATION:  4M04C                        FACILITY:  [facility]  PHYSICIAN:  Lauraine Rinne, P.A.  DATE OF BIRTH:  [birth year]  DATE OF ADMISSION:  02/08/2014 DATE OF DISCHARGE:  02/16/2014                              DISCHARGE SUMMARY   DISCHARGE DIAGNOSES: 1. 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 2. Subcutaneous heparin initiated February 04, 2014, for DVT     prophylaxis. 3. Hypertension. 4. Hyperlipidemia. 5. Gastroesophageal reflux disease. 6. Suspect sleep apnea.  HISTORY OF PRESENT ILLNESS:  This is a 68 year old right-handed female with history of hypertension, no medications x2 years, who was admitted February 02, 2014, with sudden onset of left-sided weakness, facial droop, and slurred speech as well as headache.  Blood pressure on admission 192/104.  CT of the head with right thalamic hemorrhage likely due to hypertension with blood in the right lateral ventricle and was started on Cardene drip.  The patient did not receive tPA.  Carotid Dopplers with no ICA stenosis.  Echocardiogram with ejection fraction 59%-56%, grade 1 diastolic dysfunction.  Followup cranial CT scan with stable hemorrhage.  Therapies initiated.  The patient limited by left-sided weakness and attention.  Subcutaneous heparin initiated for DVT prophylaxis on February 04, 2014.  Maintained on a regular consistency diet.  The patient was admitted for a comprehensive rehab program.  PAST MEDICAL HISTORY:  See discharge diagnoses.  SOCIAL HISTORY:  Lives with spouse.  FUNCTIONAL HISTORY:  Prior to admission, independent.  Functional status upon admission to rehab service was +2 physical assist supine to sit, minimal assist stand pivot transfers, minimal assist to ambulate 50 feet rolling walker, min to mod assist activities of daily living.  PHYSICAL EXAMINATION:   VITAL SIGNS:  Blood pressure 149/88, pulse 75, temperature 98.9, respirations 18. GENERAL:  This was an alert female, obese, in no distress. HEENT:  Left tongue deviation, dysarthric speech but intelligible. Pupils round and reactive to light. LUNGS:  Clear to auscultation. CARDIAC:  Regular rate and rhythm. ABDOMEN:  Soft, nontender.  Good bowel sounds.  REHABILITATION HOSPITAL COURSE:  The patient was admitted to inpatient rehab services with therapies initiated on a 3-hour daily basis consisting of physical therapy, occupational therapy, speech therapy, and rehabilitation nursing.  The following issues were addressed during the patient's rehabilitation stay.  Pertaining to Ms. Weberg's functional deficits secondary to right thalamic hemorrhage due to hypertensive crisis remained stable, she would follow up Neurology Services.  Blood pressures monitored on Norvasc, Lopressor.  She received full education in regard to maintaining her antihypertensive medications and following up with her primary care provider.  She had been placed on subcutaneous heparin on February 04, 2014, for DVT prophylaxis during her hospital course, no bleeding episodes.  Her skin remained intact.  She was maintained on Lipitor for hyperlipidemia. Family had questioned some need in the past for a sleep study years ago, possible need for CPAP; however, her oxygen saturations remained good. It was discussed at length with family, this could be followed up with her primary care provider which they were accepting.  The patient received  weekly collaborative interdisciplinary team conferences to discuss estimated length of stay, family teaching, and any barriers to discharge.  The patient ambulating 150 feet with short standing rest breaks using a rolling walker, minimal assist for managing rolling walker.  Strength and endurance continued to improve.  Activities of daily living focused on safety awareness and  attention to the left side during functional mobility and self-care tasks of bathing and dressing. Moderate verbal cues for attention to the left to locate the shower at times.  She was able to communicate her needs.  Follow up speech therapy for dysarthric speech.  Full family teaching was completed.  Plan was for home health physical and occupational therapy as advised.  DISCHARGE MEDICATIONS:  At the time of dictation included, 1. Norvasc 10 mg p.o. daily. 2. Lipitor 20 mg p.o. daily. 3. Pepcid 40 mg p.o. daily. 4. Neurontin 100 mg p.o. b.i.d. 5. Lopressor 50 mg p.o. b.i.d. 6. Protonix 40 mg p.o. daily.  DIET:  Regular.  SPECIAL INSTRUCTIONS:  The patient would follow up with Dr. Alysia Penna at the outpatient rehab center, March 11, 2014; Dr. Antony Contras, Neurology Services, 1 month, call for appointment; Dr. Jerene Pitch; Dr. Sharalyn Ink.  Special instructions, follow up with primary care provider with questionable need for sleep study in the future which could be addressed as outpatient.     Lauraine Rinne, P.A.     DA/MEDQ  D:  02/15/2014  T:  02/15/2014  Job:  [id number]  cc:   Zadie Cleverly D. Ayesha Rumpf, M.D. Jerene Pitch, MD Pramod P. Leonie Man, MD

## 2014-02-16 NOTE — Progress Notes (Signed)
68 y.o. right-handed female with history of GERD, HTN--no medications X 2 years; who was admitted on 02/02/14 with sudden onset of left sided weakness, facial droop as well as speech difficulty as well as HA. BP at admission 192/104. 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 Patient did not receive TPA. Carotid dopplers with 1-39% ICS stenosis. 2D echo with EF 65-70%, calcified MV and grade 1 diastolic dysfunction. Follow up CCT with stable hemorrhage  Subjective/Complaints: Anxious to go home No issues overnite  Discussed med compliance  Review of Systems - Negative except weakness on left and constipation Objective: Vital Signs: Blood pressure 125/54, pulse 49, temperature 98.7 F (37.1 C), temperature source Oral, resp. rate 18, height 5\' 4"  (1.626 m), weight 101.1 kg (222 lb 14.2 oz), SpO2 97.00%. No results found. No results found for this or any previous visit (from the past 72 hour(s)).    Cardio: RRR and no murmur Resp: CTA B/L and unlabored GI: BS positive and NT, ND Extremity:  Pulses positive and No Edema Skin:   Intact Neuro:4/5 strength on left 5/5 on right, reduced sensation LUE Musc/Skel:  Normal and Other no pain with AROM left shoulder GEN NAD   Assessment/Plan: 1. 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 Physiatrist is providing close team supervision and 24 hour management of active medical problems listed below. Physiatrist and rehab team continue to assess barriers to discharge/monitor patient progress toward functional and medical goals. FIM: FIM - Bathing Bathing Steps Patient Completed: Chest;Right Arm;Left Arm;Abdomen;Front perineal area;Buttocks;Right upper leg;Left upper leg;Right lower leg (including foot);Left lower leg (including foot) Bathing: 5:  Supervision: Safety issues/verbal cues  FIM - Upper Body Dressing/Undressing Upper body dressing/undressing steps patient completed: Thread/unthread right bra strap;Thread/unthread left bra strap;Thread/unthread right sleeve of pullover shirt/dresss;Thread/unthread left sleeve of pullover shirt/dress;Put head through opening of pull over shirt/dress;Pull shirt over trunk Upper body dressing/undressing: 5: Set-up assist to: Obtain clothing/put away FIM - Lower Body Dressing/Undressing Lower body dressing/undressing steps patient completed: Thread/unthread right underwear leg;Thread/unthread left underwear leg;Don/Doff right sock;Don/Doff left sock;Pull underwear up/down;Don/Doff right shoe;Don/Doff left shoe;Fasten/unfasten right shoe;Fasten/unfasten left shoe Lower body dressing/undressing: 5: Supervision: Safety issues/verbal cues  FIM - Toileting Toileting steps completed by patient: Adjust clothing prior to toileting;Performs perineal hygiene;Adjust clothing after toileting Toileting Assistive Devices: Grab bar or rail for support Toileting: 5: Supervision: Safety issues/verbal cues  FIM - Radio producer Devices: Insurance account manager Transfers: 5-To toilet/BSC: Supervision (verbal cues/safety issues);5-From toilet/BSC: Supervision (verbal cues/safety issues)  FIM - Engineer, site Assistive Devices: Arm rests;Walker Bed/Chair Transfer: 5: Bed > Chair or W/C: Supervision (verbal cues/safety issues);5: Chair or W/C > Bed: Supervision (verbal cues/safety issues);5: Supine > Sit: Supervision (verbal cues/safety issues);5: Sit > Supine: Supervision (verbal cues/safety issues)  FIM - Locomotion: Wheelchair Distance: 30 ft Locomotion: Wheelchair: 0: Activity did not occur (pt ambulatory) FIM - Locomotion: Ambulation Locomotion: Ambulation Assistive Devices: Administrator Ambulation/Gait Assistance: 5: Supervision Locomotion: Ambulation: 5:  Travels 150 ft or more with supervision/safety issues  Comprehension Comprehension Mode: Auditory Comprehension: 5-Follows basic conversation/direction: With no assist  Expression Expression Mode: Verbal Expression: 5-Expresses basic needs/ideas: With extra time/assistive device  Social Interaction Social Interaction: 5-Interacts appropriately 90% of the time - Needs monitoring or encouragement for participation or interaction.  Problem Solving Problem Solving: 4-Solves basic 75 -  89% of the time/requires cueing 10 - 24% of the time  Memory Memory: 4-Recognizes or recalls 75 - 89% of the time/requires cueing 10 - 24% of the time  Medical Problem List and Plan:   1. 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 DVT Prophylaxis/Anticoagulation: Subcutaneous heparin initiated 02/04/2014. Monitor for any bleeding episodes   3. Pain Management: Tylenol as needed   4. Hypertension. Norvasc 10 mg daily, Lopressor 75 mg twice a day.f/u PCP  5. Neuropsych: This patient is capable of making decisions on her own behalf.   6. Skin/Wound Care: Routine skin checks   7. Hyperlipidemia. Lipitor   8. GERD. Protonix   9. Peripheral neuropathy: ?etiology---may be related to prior [facility] abuse             -low dose gabapentin   LOS (Days) 8 A FACE TO FACE EVALUATION WAS PERFORMED  [name] E 02/16/2014, 7:01 AM

## 2014-02-16 NOTE — Progress Notes (Signed)
Patient and family received discharged instructions from Marlowe [place], PA-C with verbal understanding. Patient discharge to home with family and belongings.

## 2014-02-16 NOTE — Discharge Instructions (Signed)
Inpatient Rehab Discharge Instructions  [place] Discharge date and time: No discharge date for patient encounter.   Activities/Precautions/ Functional Status: Activity: activity as tolerated Diet: regular diet Wound Care: none needed Functional status:  ___ No restrictions     ___ Walk up steps independently ___ 24/7 supervision/assistance   ___ Walk up steps with assistance ___ Intermittent supervision/assistance  ___ Bathe/dress independently ___ Walk with walker     ___ Bathe/dress with assistance ___ Walk Independently    ___ Shower independently _x__ Walk with assistance    ___ Shower with assistance ___ No alcohol     ___ Return to work/school ________  Special Instructions: No driving Followup with pulmonary services in regards to possible sleep study completed in the past for evaluation for CPAP   COMMUNITY REFERRALS UPON DISCHARGE:    Home Health:   PT, OT, RN, [place] TGGYI:[phone] Date of last service:02/16/2014    Medical Equipment/Items Ordered:ROLLING WALKER,  BEDSIDE COMMODE, WANTS TO WAIT TO GET TUB BENCH  Agency/Supplier:[employer]  [phone]  Other:DR. [place] CIVILS-DENTIST [phone] MAY ASSIST WITH TEETH PULLING AND WORK OUT PAYMENT PLAN.  CONTACT HER OFFICE [facility] [place]  [phone] HAD SLEEP STUDY IN 2013 WOULD NEED NEW ONE AND THEN CAN PURSUE CPAP  GENERAL COMMUNITY RESOURCES FOR PATIENT/FAMILY: Support Groups:[facility] SUPPORT GROUP   My questions have been answered and I understand these instructions. I will adhere to these goals and the provided educational materials after my discharge from the hospital.  Patient/Caregiver Signature _______________________________ Date __________  Clinician Signature _______________________________________ Date __________  Please bring this form and your medication list with you to all your follow-up doctor's appointments.

## 2014-02-16 NOTE — Progress Notes (Signed)
Social Work Discharge Note Discharge Note  The overall goal for the admission was met for:   Discharge location: [place] [facility]  Length of Stay: Yes-7 DAYS  Discharge activity level: Yes-SUPERVISION LEVEL  Home/community participation: Yes  Services provided included: MD, RD, PT, OT, SLP, RN, CM, Pharmacy and SW  Financial Services: Private Insurance: [place]  Follow-up services arranged: Home Health: [facility]-PT,OT,SP,RN,SW, DME: [employer]-ROLLING WALKER, BSC and Patient/Family has no preference for HH/DME agencies  Comments (or additional information):FAMILY EDUCATION COMPLETED, NEW PCP SET UP, MEDICAID APPLICATION COMPLETED,LOW COST DENTIST SET UP. INFORMED SLEEP STUDY WOULD NEED TO BE REPEATED DUE TO BEEN 2 YEARS AND HAS NOW HAD A STROKE.  WAIT ON TUB BENCH DUE TO COST  Patient/Family verbalized understanding of follow-up arrangements: Yes  Individual responsible for coordination of the follow-up plan: JAMES-HUSBAND  Confirmed correct DME delivered: Elease Hashimoto 02/16/2014    Elease Hashimoto

## 2014-02-18 ENCOUNTER — Inpatient Hospital Stay (HOSPITAL_COMMUNITY): Payer: Medicare HMO | Admitting: *Deleted

## 2014-02-18 ENCOUNTER — Telehealth: Payer: Self-pay

## 2014-02-18 NOTE — Telephone Encounter (Signed)
If patient systolic BP goes above 355 while sitting, should go to urgent care or PCP office

## 2014-02-18 NOTE — Telephone Encounter (Signed)
I notified [name] of the orders from Dr Letta Pate.  She said the family was concerned about her Conner being 50 while she was there ( she is on Metoprolol bid 50 mg)  I told her it is not uncommon for the Conner to sometimes lower like that with a betablocker like metoprolol especially around 4 hours after dosing and will gradually increase but if it is remaining low and she becomes symptomatic with dizziness, lightheaded, extreme fatigue she should seek evaluation at urgent care.  I will pass this along to Dr Letta Pate as well. Her PCP will be [name] but she has not actually met her yet.

## 2014-02-18 NOTE — Telephone Encounter (Signed)
Kathy(PT @ [facility]) called to report patient's elevated BP. Sitting- 142/78, and standing 162/88 and Pulse-50. Patient does not have a active PCP until 9/11.   Juliann Pulse is requesting guidance of BP range.

## 2014-02-23 ENCOUNTER — Encounter: Payer: Self-pay | Admitting: Internal Medicine

## 2014-02-23 ENCOUNTER — Ambulatory Visit (INDEPENDENT_AMBULATORY_CARE_PROVIDER_SITE_OTHER): Payer: Medicare HMO | Admitting: Internal Medicine

## 2014-02-23 VITALS — BP 117/51 | HR 50 | Temp 98.2°F | Ht 64.0 in | Wt 220.2 lb

## 2014-02-23 DIAGNOSIS — I619 Nontraumatic intracerebral hemorrhage, unspecified: Secondary | ICD-10-CM

## 2014-02-23 DIAGNOSIS — I10 Essential (primary) hypertension: Secondary | ICD-10-CM

## 2014-02-23 NOTE — Patient Instructions (Signed)
General Instructions: We want you to follow up with a neurologist as soon as possible, to determine when you should start taking ASPIRIN.  Please continue taking all your medications. This is very important.  Thank you for bringing your medicines today. This helps [place] keep you safe from mistakes.

## 2014-02-23 NOTE — Progress Notes (Signed)
Patient ID: [name], female   DOB: 02-Mar-1946, 68 y.o.   MRN: [id number]   Subjective:   Patient ID: [name] female   DOB: 04/23/1946 68 y.o.   MRN: [id number]  HPI: [name] is a 68 y.o. with PMH of hemorrhagic stroke- right basal ganglia hemorrhage, HTN, obesity presented for hospital follow up. Pt is an established pt with our clinic. But has not been in for follow up in over 2 years. Pt was admitted on the 02/02/2014, was discharge- 02/16/2014 to in pt rehab, pt was discharged home- 02/16/2014. Pt was managed for right thalamic stroke- hemorrhagic- seen on CT, no TPA was given. Pt had a hx of HTN but was not taken any meds, because she said she didn't like medications. BP on admission to the ED- 218/107. Pt was admitted to the Neuro- ICU and placed on Cardene drip. Pt is currently doing very well, no new deficits, complaint with BP meds and statin. No complaints today.   Past Medical History  Diagnosis Date  . GERD (gastroesophageal reflux disease)   . Arthritis   . Hypertension   . Shortness of breath   . Sleep apnea    Current Outpatient Prescriptions  Medication Sig Dispense Refill  . amLODipine (NORVASC) 10 MG tablet Take 1 tablet (10 mg total) by mouth daily.  30 tablet  1  . atorvastatin (LIPITOR) 20 MG tablet Take 1 tablet (20 mg total) by mouth daily at 6 PM.  30 tablet  1  . famotidine (PEPCID) 40 MG tablet Take 1 tablet (40 mg total) by mouth at bedtime.  30 tablet  1  . gabapentin (NEURONTIN) 100 MG capsule Take 1 capsule (100 mg total) by mouth 2 (two) times daily.  60 capsule  1  . metoprolol (LOPRESSOR) 50 MG tablet Take 1 tablet (50 mg total) by mouth 2 (two) times daily.  60 tablet  1   No current facility-administered medications for this visit.   Family History  Problem Relation Age of Onset  . Heart disease Mother   . Cancer Brother    History   Social History  . Marital Status: Married    Spouse Name: N/A    Number of Children: N/A   . Years of Education: N/A   Social History Main Topics  . Smoking status: Former Smoker -- 2.00 packs/day    Types: Cigarettes    Start date: 06/17/1968  . Smokeless tobacco: None     Comment: Quit in 1970s  . Alcohol Use: No     Comment: former drinker for approximately 10 years  . Drug Use: No  . Sexual Activity: Yes    Birth Control/ Protection: Post-menopausal   Other Topics Concern  . None   Social History Narrative  . None   Review of Systems: CONSTITUTIONAL- No Fever, weightloss, night sweat or change in appetite. SKIN- No Rash, colour changes or itching. HEAD- Mild headache relived with tylenol present since stroke or no dizziness. Mouth/throat- No Sorethroat, dentures, or bleeding gums. RESPIRATORY- No Cough or SOB. CARDIAC- No Palpitations, DOE, PND or chest pain. GI- No nausea, vomiting, diarrhoea, constipation, abd pain. URINARY- No Frequency, urgency, straining or dysuria. NEUROLOGIC- residual left sided heaviness,  PYSCH- Denies depression or anxiety.  Objective:  Physical Exam: Filed Vitals:   02/23/14 0916  BP: 117/51  Pulse: 50  Temp: 98.2 F (36.8 C)  TempSrc: Oral  Height: 5\' 4"  (1.626 m)  Weight: 220 lb 3.2 oz (99.882 kg)  SpO2: 98%   GENERAL- alert, pleasant lady, co-operative, appears as stated age, not in any distress. HEENT- Atraumatic, normocephalic, neck supple. CARDIAC- Regular, radial pulse difficult to palpate, no murmurs, rubs or gallops. RESP- Moving equal volumes of air, and clear to auscultation bilaterally, no wheezes or crackles. ABDOMEN- Soft, nontender, no guarding or rebound, no palpable masses or organomegaly, bowel sounds present. NEURO- No obvious Cr N abnormality, strenght upper and lower extremities-Right- 5/5, left upper and lower- 4/5, sensation intact, on wheel chair, but significant other says pt can walk. EXTREMITIES- +1 mildly pitting pedal edema to knees, chronic issue for pt. SKIN- Warm, dry, No rash or  lesion. PSYCH- Normal mood and affect, appropriate thought content and speech.  Assessment & Plan:  The patient's case and plan of care was discussed with attending physician, Dr. Eppie Gibson.  Please see problem based charting for assessment and plan.

## 2014-02-23 NOTE — Assessment & Plan Note (Signed)
Never smoked since she Quit - 1970s.

## 2014-02-23 NOTE — Assessment & Plan Note (Signed)
BP Readings from Last 3 Encounters:  02/23/14 117/51  02/16/14 118/84  02/08/14 149/88    Lab Results  Component Value Date   NA 138 02/09/2014   K 4.3 02/09/2014   CREATININE 1.34* 02/09/2014    Assessment: Blood pressure control:  Controlled Progress toward BP goal:    At goal Comments: Complaint with metop- 50mg  BID and amlodipine- 10mg  daily. Denies dizziness or falls.  Plan: Medications:  continue current medications

## 2014-02-23 NOTE — Assessment & Plan Note (Addendum)
Now with residual left sided weakness. Pt had not been taking her BP meds- As she said she hates Meds. Pt doing well overall, able to ambulate independently with minimal assistance. No new deficits. Compliant with BPp meds- metop- 50mg  BID and Amlodipine- 10mg  daily. Also on statin- Atorvastatin- 20mg  daily. Has not resumed antiplatelet.   Plan- Cont current meds. - Pt not on Antiplatelet, >1 month since stroke, refferal to neurology for follow up, to determine when appropriate to start and what medication to start.

## 2014-02-23 NOTE — Discharge Summary (Addendum)
Physician Discharge Summary  Patient ID: [name] MRN: [id number] DOB/AGE: [birth year] 67 y.o.  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 female with a history of hypertension who was not currently taking any treatment who presented with sudden onset left-sided weakness while in church on 02/02/2014 at 0930 pm.. She also complained of headache. She has been told that she has hypertension the past, but did not like taking medications and therefore has not been under treatment for it for the past 2 years. Patient was not administered TPA secondary to hemorrhage. She was admitted to the neuro ICU for further evaluation and treatment. CT scan of the head showed a 2.5 x 2 cm right thalamic hemorrhage with mild extension into lateral ventricle but no hydrocephalus or midline shift. Her blood pressure was 218/110 admission. She started on nicardipine drip and blood pressure was tightly controlled. She remained stable and followup CT scan showed stable appearance of the hematoma without expansion no hydrocephalus. She was subsequently titrated of Cardene to hold medications and did well. She is seen by physical and occupational therapy and was felt to be a good patient for inpatient rehabilitation. Exam she only mild left hemiparesis but required assistance to walk. She was transferred to inpatient rehabilitation on 02/08/14 in a stable condition. She was counseled to be compliant with her blood pressure medications and to have regular followup with her primary care physician.     Consults: rehabilitation medicine  Significant Diagnostic Studies: Ct Head Wo Contrast  02/03/2014 9:35am  IMPRESSION: Right thalamic hemorrhage with ventricular extension unchanged from yesterday.  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 This also blood in the right lateral ventricle.  CUS - 1-39% internal carotid artery stenosis bilaterally. Vertebral arteries are patent with antegrade flow.  Echo - - Left ventricle: The cavity size was normal. Wall thickness was normal. Systolic function was vigorous. The estimated ejection fraction was in the range of 65% to 70%. Doppler parameters are consistent with abnormal left ventricular relaxation (grade 1 diastolic dysfunction). - Mitral valve: Calcified annulus. Mildly thickened leaflets .  Discharge Exam: Blood pressure 149/88, pulse 75, temperature 98.9 F (37.2 C), temperature source Oral, resp. rate 18, height 5\' 4"  (1.626 m), weight 224 lb 9.6 oz (101.878 kg), SpO2 99.00%. Mental Status -  Level of arousal and orientation to time, place, and person were intact.  Language including expression, naming, repetition, comprehension was assessed and found intact.  Fund of Knowledge was assessed and was intact.  Cranial Nerves II - XII -  II - Visual field intact OU.  III, IV, VI - Extraocular movements intact.  V - Facial sensation intact bilaterally.  VII - Facial movement intact bilaterally.  VIII - Hearing & vestibular intact bilaterally.  X - Palate elevates symmetrically.  XI - Chin turning & shoulder shrug intact bilaterally.  XII - Tongue protrusion intact.  Motor Strength - The patient's strength was 4-/5 LUE proximal, 5-/5 distal, 4+/5 LLE proximal and 5/5 distally. positive pronator drift on the left. Bulk was normal and fasciculations were absent.  Motor Tone - Muscle tone was assessed at the neck and appendages and was normal.  Reflexes - The patient's reflexes were normal in all extremities and  she had no pathological reflexes.  Sensory - Light touch, temperature/pinprick were assessed and  were normal.  Coordination - The patient had normal movements in the hands and feet with no ataxia or dysmetria. Tremor was absent.  Gait and Station - not tested due to BP concern   Disposition: Inpatient Rehab    Home Discharge Medications   Medication List    Notice   You have not been prescribed any medications.      If d/c to Inpatient Rehab, Medications to to continued on Rehab       Follow-up Information   Follow up with Xu,Jindong, MD. Schedule an appointment as soon as possible for a visit in 2 months. (Stroke Clinic, Dr. Erlinda Hong is a stroke MD and Dr. Clydene Fake partner)    Specialty:  Neurology   Contact information:   [address] [place] [address] [phone]       Signed: Antony Contras 02/23/2014, 4:01 PM

## 2014-02-24 NOTE — Progress Notes (Signed)
Case discussed with Dr. Denton Brick soon after the resident saw the patient.  We reviewed the resident's history and exam and pertinent patient test results.  I agree with the assessment, diagnosis and plan of care documented in the resident's note.

## 2014-02-25 ENCOUNTER — Telehealth: Payer: Self-pay | Admitting: Neurology

## 2014-02-25 NOTE — Telephone Encounter (Signed)
I have tried to call daughter and schedule patient's stroke follow up appointment , voicemail full.  Also tried patient's home number and it's busy, will continue to try and contact.

## 2014-02-28 ENCOUNTER — Telehealth: Payer: Self-pay

## 2014-02-28 NOTE — Telephone Encounter (Signed)
Amy @ [facility] called to report patient's BP- 140/70 and pulse-42. Attempted to contact Amy to advise her to inform patient's PCP also. No answer. Left a voicemail to return call.

## 2014-02-28 NOTE — Telephone Encounter (Signed)
Hold Lopressor if heart rate less than 60 Contact Dr.Ejiroghene E Cherene [facility] internal medicine resident clinic

## 2014-03-01 ENCOUNTER — Telehealth: Payer: Self-pay | Admitting: *Deleted

## 2014-03-01 NOTE — Telephone Encounter (Signed)
Thank you for following this. Sorry I am at an away rotation. Agree with [place] visit and holding BB for now.

## 2014-03-01 NOTE — Telephone Encounter (Signed)
Talked with Dr Richardean Canal and she asked for [facility] to go back to the home today and recheck Vitals. Hold Metoprolol for now.  I tried to call the home and no answer, I call [facility] nurse back and she talked with pt's son and he said pt took med last night and today.  He checked pulse and states it was 56.  Nurse is not sure it is accurate. Nurse will send someone to home to recheck vitals and call from home for orders.

## 2014-03-01 NOTE — Telephone Encounter (Signed)
Thank you.  Will look for results.

## 2014-03-01 NOTE — Telephone Encounter (Signed)
Received call from [place], [place] with [facility] [phone]  Nurse reports that yesterday pt's pulse was in the 40's Pt takes Metoprolol 50 mg BID.  Nurse states last week pt's heart rate was in the 50's and no changes were made with meds.  Returned call to nurse, nurse states pt was asymptomatic yesterday and she called Dr. Letta Pate at rehab. Orders from his were to hold Metoprolol for heart rate unde 60 and call PCP Yesterdays vitals were 140/70, pulse 42 - 45

## 2014-03-01 NOTE — Telephone Encounter (Signed)
Received call from [place] with [facility] - # [phone]  Nurse reports BP 142/82 and heart rate 56.  Pt did take metoprolol this morning. Pt # [id number]  Talked with Dr Venia Minks and she wants pt to hold Metoprolol for now and be seen this week.  [facility] called and informed pt. Scheduled for OV tomorrow at 8:15

## 2014-03-01 NOTE — Telephone Encounter (Signed)
Contacted Amy @ [place] to inform her of the message below.

## 2014-03-02 ENCOUNTER — Ambulatory Visit (INDEPENDENT_AMBULATORY_CARE_PROVIDER_SITE_OTHER): Payer: Medicare HMO | Admitting: Internal Medicine

## 2014-03-02 ENCOUNTER — Encounter: Payer: Self-pay | Admitting: Internal Medicine

## 2014-03-02 VITALS — BP 118/60 | HR 51 | Temp 98.8°F | Wt 218.9 lb

## 2014-03-02 DIAGNOSIS — I10 Essential (primary) hypertension: Secondary | ICD-10-CM

## 2014-03-02 MED ORDER — METOPROLOL SUCCINATE ER 25 MG PO TB24: 25.0000 mg | ORAL_TABLET | Freq: Every day | ORAL | Status: DC

## 2014-03-02 NOTE — Assessment & Plan Note (Signed)
Filed Vitals:   03/02/14 0825  BP: 118/60  Pulse: 51  Temp: 98.8 F (37.1 C)   BP well controlled on norvasc 10mg  and metoprolol 50mg  BID. Patient took metoprolol at night last night accidentally even though we asked her to stop it since her pulse was 40's. Today she is not symptomatic from the low HR.  Will change her regimen to norvasc 10mg  with metoprolol 25mg  Qdaily.

## 2014-03-02 NOTE — Progress Notes (Signed)
   Subjective:    Patient ID: [name], female    DOB: 09/10/1945, 68 y.o.   MRN: [id number]  HPI  68 yo female with hx of HTN, cardiomegaly, GERD, low back pain here for checkup for bradycardia found by nursing at home with HR in 40's. Baseline HR 50's. Recently admitted for right thalamic hemorrhage stroke and found to have BP 218/107.  Has been taking amlodopine 10mg  and metoprolol 50mg  BID for HTN. Was asked to hold metoprolol yesterday but she accidentally took the night dose too last night.   Today she has no symptoms other than mild headache and occasional dizziness upon standing too quickly. Denies any sob, cp, cough, n/v, fever, chills, headache, numbness, weakness, tingling.    Review of Systems  Constitutional: Negative for fever, chills, activity change, appetite change and fatigue.  HENT: Negative for congestion, drooling, ear discharge, ear pain, nosebleeds, postnasal drip, rhinorrhea, sinus pressure and sore throat.   Eyes: Negative for pain, discharge and visual disturbance.  Respiratory: Negative for cough, chest tightness, shortness of breath and wheezing.   Cardiovascular: Negative for chest pain, palpitations and leg swelling.  Gastrointestinal: Negative for nausea, diarrhea, constipation, abdominal distention and anal bleeding.  Genitourinary: Negative for dysuria, urgency, hematuria, decreased urine volume and difficulty urinating.  Musculoskeletal: Negative for arthralgias, back pain, joint swelling, neck pain and neck stiffness.  Skin: Negative.   Allergic/Immunologic: Negative.   Neurological: Negative for dizziness, seizures, syncope, speech difficulty, weakness, light-headedness, numbness and headaches.  Hematological: Negative.   Psychiatric/Behavioral: Negative.        Objective:   Physical Exam  Constitutional: She is oriented to person, place, and time. She appears well-developed and well-nourished. No distress.  HENT:  Head: Normocephalic and  atraumatic.  Right Ear: External ear normal.  Left Ear: External ear normal.  Nose: Nose normal.  Mouth/Throat: Oropharynx is clear and moist.  Eyes: Conjunctivae and EOM are normal. Pupils are equal, round, and reactive to light. Right eye exhibits no discharge. Left eye exhibits no discharge. No scleral icterus.  Neck: Normal range of motion. Neck supple. No JVD present. No thyromegaly present.  Cardiovascular: Normal rate, regular rhythm, S1 normal, S2 normal, normal heart sounds and intact distal pulses.  Exam reveals no gallop and no friction rub.   No murmur heard. Pulmonary/Chest: Effort normal and breath sounds normal. No respiratory distress. She has no wheezes. She has no rales. She exhibits no tenderness.  Abdominal: Soft. Bowel sounds are normal. She exhibits no distension and no mass. There is no tenderness. There is no rebound and no guarding.  Musculoskeletal: Normal range of motion. She exhibits no edema and no tenderness.  Lymphadenopathy:    She has no cervical adenopathy.  Neurological: She is alert and oriented to person, place, and time. She has normal strength and normal reflexes. She displays no atrophy and no tremor. No cranial nerve deficit or sensory deficit. She displays no seizure activity. GCS eye subscore is 4. GCS verbal subscore is 5. GCS motor subscore is 6.  Normal finger-to-nose exam, normal heel-to-shin, no pronator drift, normal alternating movement, normal gait.   Skin: No rash noted. She is not diaphoretic. No erythema. No pallor.  Psychiatric: She has a normal mood and affect. Her behavior is normal.        Assessment & Plan:  See problem based a&p.

## 2014-03-02 NOTE — Patient Instructions (Signed)
Please take metoprolol 25 mg only once a day. So, break the 50mg  tablet you have in half and take it only once a day.   Keep taking all of the other medication as you are currently.  Follow up in 1 week to recheck your blood pressure.

## 2014-03-03 ENCOUNTER — Telehealth: Payer: Self-pay | Admitting: *Deleted

## 2014-03-03 NOTE — Telephone Encounter (Signed)
[facility] calls to say pt's HR today is 48, she has just taken the new amlodipine dose for the second time, yesterday she started her new regimen and has taken it just now for today. Please advise

## 2014-03-04 DIAGNOSIS — E669 Obesity, unspecified: Secondary | ICD-10-CM

## 2014-03-04 DIAGNOSIS — I69919 Unspecified symptoms and signs involving cognitive functions following unspecified cerebrovascular disease: Secondary | ICD-10-CM | POA: Diagnosis not present

## 2014-03-04 DIAGNOSIS — I10 Essential (primary) hypertension: Secondary | ICD-10-CM | POA: Diagnosis not present

## 2014-03-04 DIAGNOSIS — F17200 Nicotine dependence, unspecified, uncomplicated: Secondary | ICD-10-CM | POA: Diagnosis not present

## 2014-03-04 DIAGNOSIS — I69959 Hemiplegia and hemiparesis following unspecified cerebrovascular disease affecting unspecified side: Secondary | ICD-10-CM | POA: Diagnosis not present

## 2014-03-04 NOTE — Telephone Encounter (Signed)
i called pt as instructed by dr Genene Churn, clarified pt's medicine doses and times, informed her to rest and continue taking med as prescribed, she was agreeable, it may take a week or two to see change

## 2014-03-04 NOTE — Telephone Encounter (Signed)
Patient has been scheduled on 04-08-14 and notified of appointment.

## 2014-03-09 ENCOUNTER — Ambulatory Visit (INDEPENDENT_AMBULATORY_CARE_PROVIDER_SITE_OTHER): Payer: Commercial Managed Care - HMO | Admitting: Internal Medicine

## 2014-03-09 ENCOUNTER — Encounter: Payer: Self-pay | Admitting: Internal Medicine

## 2014-03-09 VITALS — BP 133/67 | HR 63 | Temp 98.1°F | Ht 64.0 in | Wt 218.2 lb

## 2014-03-09 DIAGNOSIS — Z23 Encounter for immunization: Secondary | ICD-10-CM

## 2014-03-09 DIAGNOSIS — I10 Essential (primary) hypertension: Secondary | ICD-10-CM

## 2014-03-09 NOTE — Progress Notes (Signed)
Patient ID: [name], female   DOB: [birth year], 68 y.o.   MRN: [id number]   Subjective:   Patient ID: STANLEY HELMUTH female   DOB: 03-10-1946 68 y.o.   MRN: [id number]  HPI: Ms.Kambrie A Mclin is a 68 y.o. with PMH listed below. Was told to come in today because there was concern that her pulse was in the 40s. Pt complaint with metop- now 25mg  daily, Norvasc- 10mg  daily for BP control. Pt without- Dizziness, SOB, chest pain, palpitations or syncope.  Past Medical History  Diagnosis Date  . GERD (gastroesophageal reflux disease)   . Arthritis   . Hypertension   . Shortness of breath   . Sleep apnea    Current Outpatient Prescriptions  Medication Sig Dispense Refill  . amLODipine (NORVASC) 10 MG tablet Take 1 tablet (10 mg total) by mouth daily.  30 tablet  1  . atorvastatin (LIPITOR) 20 MG tablet Take 1 tablet (20 mg total) by mouth daily at 6 PM.  30 tablet  1  . famotidine (PEPCID) 40 MG tablet Take 1 tablet (40 mg total) by mouth at bedtime.  30 tablet  1  . gabapentin (NEURONTIN) 100 MG capsule Take 1 capsule (100 mg total) by mouth 2 (two) times daily.  60 capsule  1  . metoprolol succinate (TOPROL XL) 25 MG 24 hr tablet Take 1 tablet (25 mg total) by mouth daily.  30 tablet  11   No current facility-administered medications for this visit.   Family History  Problem Relation Age of Onset  . Heart disease Mother   . Cancer Brother    History   Social History  . Marital Status: Married    Spouse Name: N/A    Number of Children: N/A  . Years of Education: N/A   Social History Main Topics  . Smoking status: Former Smoker -- 2.00 packs/day    Types: Cigarettes    Start date: 06/17/1968  . Smokeless tobacco: None     Comment: Quit in 1970s  . Alcohol Use: No     Comment: former drinker for approximately 10 years  . Drug Use: No  . Sexual Activity: None   Other Topics Concern  . None   Social History Narrative  . None   Review of  Systems: CONSTITUTIONAL- No Fever, weightloss, night sweat or change in appetite. SKIN- No Rash, colour changes or itching. HEAD-Has mild Headache present since hemorrhagic stroke, or dizziness. EYES- No Vision loss, pain, redness, double or blurred vision. RESPIRATORY- No Cough or SOB. CARDIAC- No Palpitations, DOE, PND or chest pain. GI- No nausea, vomiting, diarrhoea, constipation, abd pain. URINARY- No Frequency, urgency, straining or dysuria. NEUROLOGIC- No Numbness, syncope, seizures , but has  Burning pain in bilat feet. [facility]- Denies depression or anxiety.  Objective:  Physical Exam: Filed Vitals:   03/09/14 0845  BP: 133/67  Pulse: 63  Temp: 98.1 F (36.7 C)  TempSrc: Oral  Height: 5\' 4"  (1.626 m)  Weight: 218 lb 3.2 oz (98.975 kg)  SpO2: 96%   GENERAL- alert, co-operative, appears as stated age, not in any distress. HEENT- Atraumatic, normocephalic, PERRL, EOMI, oral mucosa appears moist, neck supple. CARDIAC- RRR, no murmurs, rubs or gallops, pulse- initially- checked by me- 68, checked later- at the end of visit- 70. Pulse difficult to appreciate on the right, but more palpable on the left. RESP- Moving equal volumes of air, and clear to auscultation bilaterally, no wheezes or crackles. ABDOMEN- Soft, nontender, no guarding  or rebound, no palpable masses or organomegaly, bowel sounds present. BACK- Normal curvature of the spine, No tenderness along the vertebrae, no CVA tenderness. NEURO- No obvious Cr N abnormality, strenght upper and lower extremities- 5/5- Right, 4+/5- left,  sensation intact, Gait- walks with a cane. EXTREMITIES- pulse 2+, symmetric, no pedal edema. SKIN- Warm, dry, No rash or lesion. PSYCH- Normal mood and affect, appropriate thought content and speech.  Assessment & Plan:   The patient's case and plan of care was discussed with attending physician, Dr. Beryle Beams.  Please see problem based charting for assessment and plan.

## 2014-03-09 NOTE — Progress Notes (Signed)
Medicine attending: Medical history, presenting problems, physical findings, and medications, reviewed with medical resident Dr. Denton Brick and I concur with her evaluation and management plan. Murriel Hopper, M.D., [place]

## 2014-03-09 NOTE — Patient Instructions (Signed)
General Instructions: If you have dizziness, chest pain or almost passing out. Do not take the metoprolol, come to clinic and see [place] the same day or go to the ED.   Thank you for bringing your medicines today. This helps [place] keep you safe from mistakes.

## 2014-03-09 NOTE — Assessment & Plan Note (Addendum)
BP Readings from Last 3 Encounters:  03/09/14 133/67  03/02/14 118/60  02/23/14 117/51    Lab Results  Component Value Date   NA 138 02/09/2014   K 4.3 02/09/2014   CREATININE 1.34* 02/09/2014    Assessment: Blood pressure control:  controlled Progress toward BP goal:   At goal Comments: complaint with Metop- 25mg  daily, and norvasc- 10mg  daily. Complaints of bradycardia by [facility]- PR- 48. Pulse today checked 3 times in clinic- 66- 70, took her meds today and yesterday. Pt without symptoms when it was checked at home by RN. Of note, pt pulse are difficult to appreciate and relatively better appreciated on th left rather than the right. This might have contributed to low readings.  Plan: Medications:  continue current medications Educational resources provided:   Self management tools provided:   Other plans: Encouraged pt to have her pulse checked on left hand and if she feels short of breath, or dizzy/lightheaded, not to take the metoprolol and to come to clinic, same day or go to ED. - See in 4 weeks, to assess tolerance of meds.

## 2014-03-11 ENCOUNTER — Encounter: Payer: Medicare HMO | Attending: Physical Medicine & Rehabilitation

## 2014-03-11 ENCOUNTER — Inpatient Hospital Stay: Payer: Medicare HMO | Admitting: Physical Medicine & Rehabilitation

## 2014-04-08 ENCOUNTER — Ambulatory Visit (INDEPENDENT_AMBULATORY_CARE_PROVIDER_SITE_OTHER): Payer: Medicare HMO | Admitting: Neurology

## 2014-04-08 ENCOUNTER — Encounter: Payer: Self-pay | Admitting: Neurology

## 2014-04-08 VITALS — BP 123/74 | HR 63 | Ht 64.0 in | Wt 210.2 lb

## 2014-04-08 DIAGNOSIS — E785 Hyperlipidemia, unspecified: Secondary | ICD-10-CM | POA: Insufficient documentation

## 2014-04-08 DIAGNOSIS — I610 Nontraumatic intracerebral hemorrhage in hemisphere, subcortical: Secondary | ICD-10-CM

## 2014-04-08 DIAGNOSIS — I10 Essential (primary) hypertension: Secondary | ICD-10-CM

## 2014-04-08 NOTE — Progress Notes (Signed)
STROKE NEUROLOGY FOLLOW UP NOTE  NAME: [name] DOB: December 22, 1945  REASON FOR VISIT: stroke follow up HISTORY FROM: chart and pt  Today we had the pleasure of seeing [name] in follow-up at our Neurology Clinic. Pt was accompanied by husband.   History Summary [name] is a 68 y.o. female with hx of hypertension not taking medications admitted to [facility] on 02/03/14 with left sided weakness. 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 Volume: 15 cc. She was placed on cardene drip. Repeat CT in am showed stable hematoma and ventricular extension. Her BP gradually controlled by po meds with norvasc and metoprolol. She was then discharged to rehab.  Interval History During the interval time, the patient has been doing well.  She has been discharged from rehab and now at home with husband. Her left side muscle strength much improve with only minimal decreased sensation on UE and LE. BP controlled well today 123/74. However, she is not checking BP at home due to no device.   REVIEW OF SYSTEMS: Full 14 system review of systems performed and notable only for those listed below and in HPI above, all others are negative:  Constitutional: N/A  Cardiovascular: N/A  Ear/Nose/Throat: N/A  Skin: N/A  Eyes: N/A  Respiratory: SOB  Gastroitestinal: N/A  Genitourinary: N/A Hematology/Lymphatic: N/A  Endocrine: feeling cold  Musculoskeletal: N/A  Allergy/Immunology: N/A  Neurological: confusion, headache  Psychiatric: N/A  The following represents the patient's updated allergies and side effects list: No Known Allergies  Labs since last visit of relevance include the following: Results for orders placed during the hospital encounter of 02/08/14  CBC WITH DIFFERENTIAL      Result Value Ref Range   WBC 6.3  4.0 - 10.5 K/uL   RBC 4.79  3.87 - 5.11 MIL/uL   Hemoglobin 13.2  12.0 - 15.0 g/dL   HCT 40.9  36.0 - 46.0 %   MCV 85.4   78.0 - 100.0 fL   MCH 27.6  26.0 - 34.0 pg   MCHC 32.3  30.0 - 36.0 g/dL   RDW 13.6  11.5 - 15.5 %   Platelets 252  150 - 400 K/uL   Neutrophils Relative % 51  43 - 77 %   Neutro Abs 3.2  1.7 - 7.7 K/uL   Lymphocytes Relative 34  12 - 46 %   Lymphs Abs 2.1  0.7 - 4.0 K/uL   Monocytes Relative 10  3 - 12 %   Monocytes Absolute 0.6  0.1 - 1.0 K/uL   Eosinophils Relative 3  0 - 5 %   Eosinophils Absolute 0.2  0.0 - 0.7 K/uL   Basophils Relative 2 (*) 0 - 1 %   Basophils Absolute 0.1  0.0 - 0.1 K/uL  COMPREHENSIVE METABOLIC PANEL      Result Value Ref Range   Sodium 138  137 - 147 mEq/L   Potassium 4.3  3.7 - 5.3 mEq/L   Chloride 101  96 - 112 mEq/L   CO2 24  19 - 32 mEq/L   Glucose, Bld 105 (*) 70 - 99 mg/dL   BUN 29 (*) 6 - 23 mg/dL   Creatinine, Ser 1.34 (*) 0.50 - 1.10 mg/dL   Calcium 9.4  8.4 - 10.5 mg/dL   Total Protein 7.9  6.0 - 8.3 g/dL   Albumin 3.6  3.5 - 5.2 g/dL   AST 24  0 - 37 U/L   ALT  41 (*) 0 - 35 U/L   Alkaline Phosphatase 70  39 - 117 U/L   Total Bilirubin 0.4  0.3 - 1.2 mg/dL   GFR calc non Af Amer 40 (*) >90 mL/min   GFR calc Af Amer 46 (*) >90 mL/min   Anion gap 13  5 - 15    The neurologically relevant items on the patient's problem list were reviewed on today's visit.  Neurologic Examination  A problem focused neurological exam (12 or more points of the single system neurologic examination, vital signs counts as 1 point, cranial nerves count for 8 points) was performed.  Blood pressure 123/74, pulse 63, height 5\' 4"  (1.626 m), weight 210 lb 3.2 oz (95.346 kg).  General - obese, well developed, in no apparent distress.  Ophthalmologic - not able to see through.  Cardiovascular - Regular rate and rhythm with no murmur.  Mental Status -  Level of arousal and orientation to time, place, and person were intact. Language including expression, naming, repetition, comprehension was assessed and found intact.  Cranial Nerves II - XII - II - Visual  field intact OU. III, IV, VI - Extraocular movements intact. V - Facial sensation intact bilaterally. VII - Facial movement intact bilaterally. VIII - Hearing & vestibular intact bilaterally. X - Palate elevates symmetrically. XI - Chin turning & shoulder shrug intact bilaterally. XII - Tongue protrusion intact.  Motor Strength - The patient's strength was normal in all extremities and pronator drift was absent.  Bulk was normal and fasciculations were absent.   Motor Tone - Muscle tone was assessed at the neck and appendages and was normal.  Reflexes - The patient's reflexes were normal in all extremities and she had no pathological reflexes.  Sensory - Light touch, temperature/pinprick slightly decreased on the left UE and LE.    Coordination - The patient had normal movements in the hands and feet with no ataxia or dysmetria.  Tremor was absent.  Gait and Station - slow, wide based but stable.  Data reviewed: I personally reviewed the images and agree with the radiology interpretations.  Ct Head Wo Contrast  02/03/2014 9:35am IMPRESSION: Right thalamic hemorrhage with ventricular extension unchanged from yesterday.  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 This also blood in the right lateral ventricle.  CUS - 1-39% internal carotid artery stenosis bilaterally. Vertebral arteries are patent with antegrade flow.  Echo - - Left ventricle: The cavity size was normal. Wall thickness was normal. Systolic function was vigorous. The estimated ejection fraction was in the range of 65% to 70%. Doppler parameters are consistent with abnormal left ventricular relaxation (grade 1 diastolic dysfunction). - Mitral valve: Calcified annulus. Mildly thickened leaflets .  LDL 136 and A1C 5.8 and TSH normal range  Assessment: As you may recall, she is a 68 y.o. African American female with PMH of HTN not on medication was admitted on 8/20 due to  right thalamic bleeding extending to ventricles. BP was high initially but later controlled well with medications. She is now on amlodipine and metoprolol and BP normal in clinic today. She needs continued to check BP and take the medications. Will repeat CT and if blood resolved, will think about ASA 81mg  addition for stroke prevention.  Plan:  - repeat CT head non con - consider ASA 81mg  for stroke prevention if hematoma resolves - continue lipitor for HLD and stroke prevention - check BP at home - Follow up with your primary care physician  for stroke risk factor modification. Recommend maintain blood pressure goal <130/80, diabetes with hemoglobin A1c goal below 6.5% and lipids with LDL cholesterol goal below 70 mg/dL.  - RTC in 3 months.  Orders Placed This Encounter  Procedures  . CT Head Wo Contrast    Standing Status: Future     Number of Occurrences:      Standing Expiration Date: 07/10/2015    Order Specific Question:  Reason for Exam (SYMPTOM  OR DIAGNOSIS REQUIRED)    Answer:  hemorrhagic transformation    Order Specific Question:  Preferred imaging location?    Answer:  Internal    Patient Instructions  - will repeat head CT to see the resolution of hematoma - if blood all absorbed, then we will start ASA 81mg  for stroke prevention - continue lipitor for HLD and stroke prevention - ask PCP for a prescription of BP home monitoring device and check BP at home - Follow up with your primary care physician for stroke risk factor modification. Recommend maintain blood pressure goal <130/80, diabetes with hemoglobin A1c goal below 6.5% and lipids with LDL cholesterol goal below 70 mg/dL.  - no restriction for driving but your family need to ride with you for 2-3 times initially and if you and your family feel comfortable with driving, I am OK with you to drive - follow up in 3 months.    Brandy Hawking, MD PhD [facility] [address] [place], [place]  [address] [phone]

## 2014-04-08 NOTE — Patient Instructions (Signed)
-   will repeat head CT to see the resolution of hematoma - if blood all absorbed, then we will start ASA 81mg  for stroke prevention - continue lipitor for HLD and stroke prevention - ask PCP for a prescription of BP home monitoring device and check BP at home - Follow up with your primary care physician for stroke risk factor modification. Recommend maintain blood pressure goal <130/80, diabetes with hemoglobin A1c goal below 6.5% and lipids with LDL cholesterol goal below 70 mg/dL.  - no restriction for driving but your family need to ride with you for 2-3 times initially and if you and your family feel comfortable with driving, I am OK with you to drive - follow up in 3 months.

## 2014-04-13 ENCOUNTER — Emergency Department (HOSPITAL_COMMUNITY)
Admission: EM | Admit: 2014-04-13 | Discharge: 2014-04-13 | Disposition: A | Discharge status: Home / Self Care | Payer: Commercial Managed Care - HMO | Attending: Emergency Medicine | Admitting: Emergency Medicine

## 2014-04-13 ENCOUNTER — Encounter (HOSPITAL_COMMUNITY): Payer: Self-pay | Admitting: Emergency Medicine

## 2014-04-13 ENCOUNTER — Emergency Department (HOSPITAL_COMMUNITY)
Admission: EM | Admit: 2014-04-13 | Discharge: 2014-04-13 | Disposition: A | Discharge status: Home / Self Care | Payer: No Typology Code available for payment source | Attending: Emergency Medicine | Admitting: Emergency Medicine

## 2014-04-13 DIAGNOSIS — Z87891 Personal history of nicotine dependence: Secondary | ICD-10-CM | POA: Insufficient documentation

## 2014-04-13 DIAGNOSIS — Z79899 Other long term (current) drug therapy: Secondary | ICD-10-CM | POA: Insufficient documentation

## 2014-04-13 DIAGNOSIS — Z8719 Personal history of other diseases of the digestive system: Secondary | ICD-10-CM | POA: Diagnosis not present

## 2014-04-13 DIAGNOSIS — M1990 Unspecified osteoarthritis, unspecified site: Secondary | ICD-10-CM | POA: Diagnosis not present

## 2014-04-13 DIAGNOSIS — I10 Essential (primary) hypertension: Secondary | ICD-10-CM | POA: Diagnosis not present

## 2014-04-13 DIAGNOSIS — Y9389 Activity, other specified: Secondary | ICD-10-CM | POA: Insufficient documentation

## 2014-04-13 DIAGNOSIS — S79922A Unspecified injury of left thigh, initial encounter: Secondary | ICD-10-CM | POA: Diagnosis present

## 2014-04-13 DIAGNOSIS — H43391 Other vitreous opacities, right eye: Secondary | ICD-10-CM | POA: Diagnosis not present

## 2014-04-13 DIAGNOSIS — Z8673 Personal history of transient ischemic attack (TIA), and cerebral infarction without residual deficits: Secondary | ICD-10-CM | POA: Diagnosis not present

## 2014-04-13 DIAGNOSIS — Y92410 Unspecified street and highway as the place of occurrence of the external cause: Secondary | ICD-10-CM | POA: Diagnosis not present

## 2014-04-13 DIAGNOSIS — H9313 Tinnitus, bilateral: Secondary | ICD-10-CM | POA: Diagnosis not present

## 2014-04-13 DIAGNOSIS — Z8669 Personal history of other diseases of the nervous system and sense organs: Secondary | ICD-10-CM | POA: Insufficient documentation

## 2014-04-13 DIAGNOSIS — R200 Anesthesia of skin: Secondary | ICD-10-CM | POA: Diagnosis not present

## 2014-04-13 DIAGNOSIS — H9319 Tinnitus, unspecified ear: Secondary | ICD-10-CM | POA: Diagnosis present

## 2014-04-13 MED ORDER — DIAZEPAM 5 MG PO TABS: 5.0000 mg | ORAL_TABLET | Freq: Two times a day (BID) | ORAL | Status: DC

## 2014-04-13 MED ORDER — HYDROCODONE-ACETAMINOPHEN 5-325 MG PO TABS: 1.0000 | ORAL_TABLET | Freq: Four times a day (QID) | ORAL | Status: DC | PRN

## 2014-04-13 NOTE — Discharge Instructions (Signed)
Tinnitus Sounds you hear in your ears and coming from within the ear is called tinnitus. This can be a symptom of many ear disorders. It is often associated with hearing loss.  Tinnitus can be seen with:  Infections.  Ear blockages such as wax buildup.  Meniere's disease.  Ear damage.  Inherited.  Occupational causes. While irritating, it is not usually a threat to health. When the cause of the tinnitus is wax, infection in the middle ear, or foreign body it is easily treated. Hearing loss will usually be reversible.  TREATMENT  When treating the underlying cause does not get rid of tinnitus, it may be necessary to get rid of the unwanted sound by covering it up with more pleasant background noises. This may include music, the radio etc. There are tinnitus maskers which can be worn which produce background noise to cover up the tinnitus. Avoid all medications which tend to make tinnitus worse such as alcohol, caffeine, aspirin, and nicotine. There are many soothing background tapes such as rain, ocean, thunderstorms, etc. These soothing sounds help with sleeping or resting. Keep all follow-up appointments and referrals. This is important to identify the cause of the problem. It also helps avoid complications, impaired hearing, disability, or chronic pain. Document Released: 06/03/2005 Document Revised: 08/26/2011 Document Reviewed: 01/20/2008 [facility] Patient Information 2015 [place], [place]. This information is not intended to replace advice given to you by your health care provider. Make sure you discuss any questions you have with your health care provider.  Eye Floaters A jelly-like fluid fills the inside of the eye and is called the vitreous. The vitreous is normally clear. It allows light to pass through to the back of the eye to the tissues that contain the nerves needed for vision (the retina). With age, the vitreous can start to decline. If a decline happens, specks of material from  clumps of cells, blood, or other materials may start to float around inside the eye. These objects cast shadows on the retina. These shadows are seen as moving strings, streaks, "bugs," dust or spider webs floating in front of the eye. CAUSES   Age.  A high degree of near-sightedness (high myopia).  Tears in the retina.  Bleeding inside the eye from broken retinal blood vessels as a result of disease (diabetes, inflammation of the retinal blood vessels, and others).  Blood clot of the major vein of the retina or its branches (retinal vein occlusion).  Trauma.  Retinal detachment.  Vitreous detachment.  Eye surgery.  Inflammation inside the eye (uveitis).  Infection inside the eye. SYMPTOMS   Seeing floating specs, dots or spider webs in the vision of one eye. This can sometimes be associated with flashes of light seen off to the side.  Bleeding in the eye may begin as floaters and lead to complete vision loss as the vitreous fills with blood. This may happen repeatedly in certain diseases of the blood vessels of the retina (e.g. diabetes).  If the vitreous shrinks enough to pull away from the retina (posterior vitreous detachment), a small circular ring-shaped floater may be seen. Migraine headaches may be associated with many forms of visual symptoms (sparkling dots, wavy lines) just before the headache strikes. These symptoms due to migraine are not from floaters. They will disappear when the headache goes away. DIAGNOSIS  An eye professional can tell you if you have floaters during an eye exam. TREATMENT  There is no treatment for the floaters themselves.  If the floaters are  due to a tear in the retina, a retinal detachment or other eye disease, the condition that caused the floaters must be treated.  Floaters due to blood in the eye often go away or lessen with time. SEEK MEDICAL CARE IF:   You suddenly see floating dots or spider webs in front of the vision of one or  both eyes. This is especially true if you also see flashes of light off to the side (like flashes of lightening).  You see floaters and also notice a change or drop in your vision in either eye. Document Released: 06/06/2003 Document Revised: 08/26/2011 Document Reviewed: 10/01/2007 [facility] Patient Information 2015 [place], [place]. This information is not intended to replace advice given to you by your health care provider. Make sure you discuss any questions you have with your health care provider.  Not every illness or injury can be identified during an emergency department visit, thus follow-up with your primary healthcare provider is important. Medical conditions can also worsen, so it is also important to return immediately as directed below, or if you have other serious concerns develop. RETURN IMMEDIATELY IF you develop new shortness of breath, chest pain, fever, have difficulty moving parts of your body (new weakness, numbness, or incoordination), sudden change in speech, vision, swallowing, or understanding, faint or develop new dizziness, severe headache, become poorly responsive or have an altered mental status compared to baseline for you, new rash, abdominal pain, or bloody stools,  Return sooner also if you develop new problems for which you have not talked to your caregiver but you feel may be emergency medical conditions, or are unable to be cared for safely at home.
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
## 2014-04-13 NOTE — ED Provider Notes (Signed)
CSN: [id number]     Arrival date & time 04/13/14  0349 History   First MD Initiated Contact with Patient 04/13/14 0435     Chief Complaint  Patient presents with  . Tinnitus     (Consider location/radiation/quality/duration/timing/severity/associated sxs/prior Treatment) HPI 68 -year-old female complains of 2 nights of gradual onset constant mild tinnitus with no change in chronic headaches she has had a couple times a week since having an prior hemorrhagic stroke, she also has minimal residual left arm numbness and gait difficulty since prior stroke, she also states over the last month she has had a slight change in her right eye vision feeling as if there is a tiny hair fiber dangling in her right eye vision with no flashing lights no pain no change in visual acuity no blind spots or loss of peripheral vision with no change in speech or swallowing understanding no vertigo no lateralizing new weakness or numbness or incoordination just baseline slight numbness to her left arm with baseline mild difficulty with gait since prior stroke.  Past Medical History  Diagnosis Date  . GERD (gastroesophageal reflux disease)   . Arthritis   . Hypertension   . Shortness of breath   . Sleep apnea    Past Surgical History  Procedure Laterality Date  . None     Family History  Problem Relation Age of Onset  . Heart disease Mother   . Cancer Brother    History  Substance Use Topics  . Smoking status: Former Smoker -- 2.00 packs/day    Types: Cigarettes    Start date: 06/17/1968  . Smokeless tobacco: Never Used     Comment: Quit in 36s  . Alcohol Use: No     Comment: former drinker for approximately 10 years   OB History    No data available     Review of Systems  10 Systems reviewed and are negative for acute change except as noted in the HPI.  Allergies  Review of patient's allergies indicates no known allergies.  Home Medications   Prior to Admission medications    Medication Sig Start Date End Date Taking? Authorizing Provider  acetaminophen (TYLENOL) 500 MG tablet Take 500 mg by mouth every 6 (six) hours as needed.   Yes Historical Provider, MD  amLODipine (NORVASC) 10 MG tablet Take 1 tablet (10 mg total) by mouth daily. 02/16/14  Yes Daniel J Angiulli, PA-C  metoprolol succinate (TOPROL XL) 25 MG 24 hr tablet Take 1 tablet (25 mg total) by mouth daily. 03/02/14 03/02/15 Yes Tasrif Ahmed, MD  diazepam (VALIUM) 5 MG tablet Take 1 tablet (5 mg total) by mouth 2 (two) times daily. 04/13/14   Hyman Bible, PA-C  HYDROcodone-acetaminophen (NORCO/VICODIN) 5-325 MG per tablet Take 1-2 tablets by mouth every 6 (six) hours as needed. 04/13/14   Heather Laisure, PA-C   BP 159/76 mmHg  Pulse 74  Temp(Src) 98.7 F (37.1 C) (Oral)  Resp 22  Ht 5\' 4"  (1.626 m)  Wt 210 lb (95.255 kg)  BMI 36.03 kg/m2  SpO2 99% Physical Exam  Nursing note and vitals reviewed. Constitutional:  Awake, alert, nontoxic appearance with baseline speech for patient.  HENT:  Head: Atraumatic.  Mouth/Throat: No oropharyngeal exudate.  Eyes: EOM are normal. Pupils are equal, round, and reactive to light. Right eye exhibits no discharge. Left eye exhibits no discharge.  Fundi no hemorrhage noted with disc margins sharp  Neck: Neck supple.  Cardiovascular: Normal rate and regular rhythm.   No  murmur heard. Pulmonary/Chest: Effort normal and breath sounds normal. No stridor. No respiratory distress. She has no wheezes. She has no rales. She exhibits no tenderness.  Abdominal: Soft. Bowel sounds are normal. She exhibits no mass. There is no tenderness. There is no rebound.  Musculoskeletal: She exhibits no tenderness.  Baseline ROM, moves extremities with no obvious new focal weakness.  Lymphadenopathy:    She has no cervical adenopathy.  Neurological: She is alert.  Awake, alert, cooperative and aware of situation; motor strength 5/5 bilaterally; sensation normal to light touch  bilaterally except slight decreased light touch left arm only which is baseline for the patient since she is recovered from her stroke; peripheral visual fields full to confrontation; no facial asymmetry; tongue midline; major cranial nerves appear intact; no pronator drift, normal finger to nose bilaterally, baseline gait slightly unsteady but without new ataxia.  Skin: No rash noted.  Psychiatric: She has a normal mood and affect.    ED Course  Procedures (including critical care time) D/w [facility] who agrees no indication for emergent imaging; also doubt acute retinal detachment. Labs Review Labs Reviewed - No data to display  Imaging Review No results found.   EKG Interpretation None      MDM   Final diagnoses:  Tinnitus, bilateral  Visual floaters, right    Patient / Family / Caregiver informed of clinical course, understand medical decision-making process, and agree with plan. I doubt any other EMC precluding discharge at this time including, but not necessarily limited to the following:SAH, acute CVA.    Babette Relic, MD 04/17/14 [phone]

## 2014-04-13 NOTE — ED Notes (Signed)
Pt reports intermittent bilateral ringing in her ears since Monday. Onset is usually at night time. Pt states the "ringing" is more like a white noise sound. Pt denies any associative symptoms.
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
## 2014-04-22 ENCOUNTER — Encounter: Payer: Self-pay | Admitting: Internal Medicine

## 2014-04-22 ENCOUNTER — Ambulatory Visit (INDEPENDENT_AMBULATORY_CARE_PROVIDER_SITE_OTHER): Payer: Commercial Managed Care - HMO | Admitting: Internal Medicine

## 2014-04-22 ENCOUNTER — Ambulatory Visit
Admission: RE | Admit: 2014-04-22 | Discharge: 2014-04-22 | Disposition: A | Discharge status: Home / Self Care | Payer: Commercial Managed Care - HMO | Source: Ambulatory Visit | Attending: Neurology | Admitting: Neurology

## 2014-04-22 VITALS — BP 140/74 | HR 56 | Temp 97.9°F | Ht 64.0 in | Wt 209.0 lb

## 2014-04-22 DIAGNOSIS — Z041 Encounter for examination and observation following transport accident: Secondary | ICD-10-CM | POA: Insufficient documentation

## 2014-04-22 DIAGNOSIS — I610 Nontraumatic intracerebral hemorrhage in hemisphere, subcortical: Secondary | ICD-10-CM

## 2014-04-22 DIAGNOSIS — Z0000 Encounter for general adult medical examination without abnormal findings: Secondary | ICD-10-CM

## 2014-04-22 DIAGNOSIS — H538 Other visual disturbances: Secondary | ICD-10-CM | POA: Insufficient documentation

## 2014-04-22 DIAGNOSIS — Z1239 Encounter for other screening for malignant neoplasm of breast: Secondary | ICD-10-CM

## 2014-04-22 DIAGNOSIS — I10 Essential (primary) hypertension: Secondary | ICD-10-CM

## 2014-04-22 DIAGNOSIS — Z043 Encounter for examination and observation following other accident: Secondary | ICD-10-CM
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
## 2014-04-22 NOTE — Assessment & Plan Note (Signed)
BP Readings from Last 3 Encounters:  04/22/14 140/74  04/13/14 133/75  04/13/14 159/76    Lab Results  Component Value Date   NA 138 02/09/2014   K 4.3 02/09/2014   CREATININE 1.34* 02/09/2014   Assessment: Blood pressure control: mildly elevated Progress toward BP goal:  deteriorated Comments: complaint with medications but hesitant to star new ones  Plan: Medications:  continue current medications toprol xl 25mg  daly and norvasc 10mg  daily Educational resources provided:   Self management tools provided:   Other plans: she does not want change in medications today, however, I stress the importance of improved blood pressure control. She will try to get a BP monitor at home and check blood pressures at home and report back in ~2 weeks for follow up. If still elevated, I would consider starting HCTZ
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
## 2014-04-22 NOTE — Progress Notes (Signed)
INTERNAL MEDICINE TEACHING ATTENDING ADDENDUM - Isami Mehra, MD: I reviewed and discussed at the time of visit with the resident Dr. Qureshi, the patient's medical history, physical examination, diagnosis and results of pertinent tests and treatment and I agree with the patient's care as documented.  

## 2014-04-22 NOTE — Patient Instructions (Signed)
General Instructions:  Thank you for bringing your medicines today. This helps [place] keep you safe from mistakes.  Please get your eye exam done and mammogram   Please check your blood pressure at home and return back to clinic in 2-3 weeks for recheck as we may need to change your medications  Please restart your atorvastatin  Follow up with neurology from your CT scan Progress Toward Treatment Goals:  Treatment Goal 04/22/2014  Blood pressure deteriorated  Prevent falls at goal    Self Care Goals & Plans:  Self Care Goal 04/22/2014  Manage my medications take my medicines as prescribed; bring my medications to every visit; refill my medications on time  Monitor my health keep track of my blood pressure  Eat healthy foods eat more vegetables; eat foods that are low in salt; eat baked foods instead of fried foods  Be physically active find an activity I enjoy  Prevent falls wear appropriate shoes    No flowsheet data found.   Care Management & Community Referrals:  No flowsheet data found.

## 2014-04-22 NOTE — Assessment & Plan Note (Signed)
Mammogram referral today
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
## 2014-04-26 ENCOUNTER — Encounter: Payer: Commercial Managed Care - HMO | Admitting: Internal Medicine

## 2014-04-27 ENCOUNTER — Telehealth: Payer: Self-pay | Admitting: Neurology

## 2014-04-27 DIAGNOSIS — I610 Nontraumatic intracerebral hemorrhage in hemisphere, subcortical: Secondary | ICD-10-CM

## 2014-04-27 MED ORDER — ASPIRIN EC 81 MG PO TBEC: 81.0000 mg | DELAYED_RELEASE_TABLET | Freq: Every day | ORAL | Status: AC

## 2014-04-27 NOTE — Telephone Encounter (Signed)
Talked with pt over the phone. Her repeat CT showed resolution of previous hematoma. Will restart baby ASA once a day. She prefer to buy over the counter. I am OK with that and I told her to be compliant with medication. She expressed understanding and appreciation.  Rosalin Hawking, MD PhD Stroke Neurology 04/27/2014 7:12 PM

## 2014-05-05 ENCOUNTER — Ambulatory Visit (INDEPENDENT_AMBULATORY_CARE_PROVIDER_SITE_OTHER): Payer: Commercial Managed Care - HMO | Admitting: Internal Medicine

## 2014-05-05 ENCOUNTER — Encounter: Payer: Self-pay | Admitting: Internal Medicine

## 2014-05-05 VITALS — BP 130/71 | HR 62 | Temp 98.1°F | Ht 64.0 in | Wt 208.3 lb

## 2014-05-05 DIAGNOSIS — M62838 Other muscle spasm: Secondary | ICD-10-CM

## 2014-05-05 DIAGNOSIS — I10 Essential (primary) hypertension: Secondary | ICD-10-CM

## 2014-05-05 DIAGNOSIS — H538 Other visual disturbances: Secondary | ICD-10-CM

## 2014-05-05 DIAGNOSIS — I610 Nontraumatic intracerebral hemorrhage in hemisphere, subcortical: Secondary | ICD-10-CM

## 2014-05-05 NOTE — Progress Notes (Signed)
Case discussed with Dr. Wilson soon after the resident saw the patient. We reviewed the resident's history and exam and pertinent patient test results. I agree with the assessment, diagnosis, and plan of care documented in the resident's note. 

## 2014-05-05 NOTE — Patient Instructions (Signed)
1. We will call you with your eye doctor appointment date and time.     2. Please take all medications as prescribed.      3. If you have worsening of your symptoms or new symptoms arise, please call the clinic ([phone]), or go to the ER immediately if symptoms are severe.  For your neck/shoulder pain, try applying ice (wrapped in towel) or warm heating pad/wash cloth to the muscles that hurt.  Use Tylenol only if needed.  Try exercise and massage.  AVOID direct spine manipulation.  Please let your chiropractor know that your shoulders are sore so they may use more gently techniques.  Maintain good posture.   Return to clinic if your symptoms do not improve in the next few weeks.   Muscle Cramps and Spasms Muscle cramps and spasms occur when a muscle or muscles tighten and you have no control over this tightening (involuntary muscle contraction). They are a common problem and can develop in any muscle. The most common place is in the calf muscles of the leg. Both muscle cramps and muscle spasms are involuntary muscle contractions, but they also have differences:   Muscle cramps are sporadic and painful. They may last a few seconds to a quarter of an hour. Muscle cramps are often more forceful and last longer than muscle spasms.  Muscle spasms may or may not be painful. They may also last just a few seconds or much longer. CAUSES  It is uncommon for cramps or spasms to be due to a serious underlying problem. In many cases, the cause of cramps or spasms is unknown. Some common causes are:   Overexertion.   Overuse from repetitive motions (doing the same thing over and over).   Remaining in a certain position for a long period of time.   Improper preparation, form, or technique while performing a sport or activity.   Dehydration.   Injury.   Side effects of some medicines.   Abnormally low levels of the salts and ions in your blood (electrolytes), especially potassium and  calcium. This could happen if you are taking water pills (diuretics) or you are pregnant.  Some underlying medical problems can make it more likely to develop cramps or spasms. These include, but are not limited to:   Diabetes.   Parkinson disease.   Hormone disorders, such as thyroid problems.   Alcohol abuse.   Diseases specific to muscles, joints, and bones.   Blood vessel disease where not enough blood is getting to the muscles.  HOME CARE INSTRUCTIONS   Stay well hydrated. Drink enough water and fluids to keep your urine clear or pale yellow.  It may be helpful to massage, stretch, and relax the affected muscle.  For tight or tense muscles, use a warm towel, heating pad, or hot shower water directed to the affected area.  If you are sore or have pain after a cramp or spasm, applying ice to the affected area may relieve discomfort.  Put ice in a plastic bag.  Place a towel between your skin and the bag.  Leave the ice on for 15-20 minutes, 03-04 times a day.  Medicines used to treat a known cause of cramps or spasms may help reduce their frequency or severity. Only take over-the-counter or prescription medicines as directed by your caregiver. SEEK MEDICAL CARE IF:  Your cramps or spasms get more severe, more frequent, or do not improve over time.  MAKE SURE YOU:   Understand these instructions.  Will watch your condition.  Will get help right away if you are not doing well or get worse. Document Released: 11/23/2001 Document Revised: 09/28/2012 Document Reviewed: 05/20/2012 [facility] Patient Information 2015 [place], [place]. This information is not intended to replace advice given to you by your health care provider. Make sure you discuss any questions you have with your health care provider.
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
## 2014-05-08 DIAGNOSIS — M62838 Other muscle spasm: Secondary | ICD-10-CM | POA: Insufficient documentation
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
## 2014-05-08 NOTE — Addendum Note (Signed)
Addended by: Francesca Oman on: 05/08/2014 05:20 AM   Modules accepted: Level of Service

## 2014-05-08 NOTE — Assessment & Plan Note (Addendum)
BP Readings from Last 3 Encounters:  05/05/14 130/71  04/22/14 140/74  04/13/14 133/75    Lab Results  Component Value Date   NA 138 02/09/2014   K 4.3 02/09/2014   CREATININE 1.34* 02/09/2014    Assessment: Blood pressure control:  controlled Progress toward BP goal:   at goal Comments: Her BP was initially elevated 128/108 but recheck was 130/71.  She reports compliance with medications.  Plan: Medications:  continue current medications:  Toprol XL 25mg  daily, amlodipine 10mg  daily Other plans:  BP check next visit

## 2014-05-08 NOTE — Assessment & Plan Note (Addendum)
This has been a problem for the past two weeks.  She feels as if she needs to clean her glasses but this does not make a difference.  Occasional floater in the right periphery.  Otherwise, no pain, itching, discharge, redness, double vision or vision loss.  Exam is grossly normal.  No s&s of infection; no vision loss and B/L involvement makes CRAO very unlikely; lack of pain makes angle-closure glaucoma very unlikely; no hx of DM to suggest diabetic retinopathy.  Differential includes posterior vitreal detachment (presence of floaters and common > age 68), retinal detachment (can accompany PVD), open angle glaucoma or cataract.  Urgent referred to ophtho by PCP but the patient says she has not received a call regarding an appt yet.   - will follow-up on ophtho referral (may require prior-auth?) - patient advised to seek emergency care if new or worsening symptoms
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
## 2014-05-16 ENCOUNTER — Ambulatory Visit: Payer: Commercial Managed Care - HMO | Admitting: Internal Medicine

## 2014-05-24 ENCOUNTER — Ambulatory Visit (HOSPITAL_COMMUNITY)
Admission: RE | Admit: 2014-05-24 | Discharge: 2014-05-24 | Disposition: A | Discharge status: Home / Self Care | Payer: Commercial Managed Care - HMO | Source: Ambulatory Visit | Attending: Internal Medicine | Admitting: Internal Medicine

## 2014-05-24 ENCOUNTER — Other Ambulatory Visit: Payer: Self-pay | Admitting: Internal Medicine

## 2014-05-24 DIAGNOSIS — Z1239 Encounter for other screening for malignant neoplasm of breast: Secondary | ICD-10-CM

## 2014-05-24 DIAGNOSIS — Z1231 Encounter for screening mammogram for malignant neoplasm of breast: Secondary | ICD-10-CM

## 2014-05-24 LAB — HM DIABETES EYE EXAM

## 2014-07-11 ENCOUNTER — Ambulatory Visit: Payer: Medicare HMO | Admitting: Neurology

## 2014-07-13 ENCOUNTER — Ambulatory Visit: Payer: Self-pay | Admitting: Neurology

## 2014-07-14 ENCOUNTER — Encounter: Payer: Self-pay | Admitting: Neurology

## 2014-07-14 ENCOUNTER — Ambulatory Visit (INDEPENDENT_AMBULATORY_CARE_PROVIDER_SITE_OTHER): Payer: Commercial Managed Care - HMO | Admitting: Neurology

## 2014-07-14 VITALS — BP 121/65 | HR 68 | Ht 64.0 in | Wt 197.4 lb

## 2014-07-14 DIAGNOSIS — I10 Essential (primary) hypertension: Secondary | ICD-10-CM | POA: Diagnosis not present

## 2014-07-14 DIAGNOSIS — G4733 Obstructive sleep apnea (adult) (pediatric): Secondary | ICD-10-CM | POA: Diagnosis not present

## 2014-07-14 DIAGNOSIS — E785 Hyperlipidemia, unspecified: Secondary | ICD-10-CM

## 2014-07-14 DIAGNOSIS — I610 Nontraumatic intracerebral hemorrhage in hemisphere, subcortical: Secondary | ICD-10-CM

## 2014-07-14 NOTE — Progress Notes (Signed)
STROKE NEUROLOGY FOLLOW UP NOTE  NAME: [name] DOB: 01-19-1946  REASON FOR VISIT: stroke follow up HISTORY FROM: chart and pt  Today we had the pleasure of seeing [name] in follow-up at our Neurology Clinic. Pt was accompanied by husband.   History Summary [name] is a 69 y.o. female with hx of hypertension not taking medications admitted to [facility] on 02/03/14 with left sided weakness. 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 Volume: 15 cc. She was placed on cardene drip. Repeat CT in am showed stable hematoma and ventricular extension. Her BP gradually controlled by po meds with norvasc and metoprolol. She was then discharged to rehab.  04/08/14 follow up - the patient has been doing well.  She has been discharged from rehab and now at home with husband. Her left side muscle strength much improve with only minimal decreased sensation on UE and LE. BP controlled well today 123/74. However, she is not checking BP at home due to no device.   Interval History During the interval time, the patient has been doing well. Her repeat CT showed resolution of hematoma and she was put on ASA 81 for stroke prevention. She is supposed to take lipitor for HLD but she did not take now. I encouraged her to take it. Her BP under control with meds today BP 121/65. She has OSA but not on CPAP yet as she stated that she has no money to pay for the CPAP but she is working on that. She has not gotten BP monitoring device to check BP at home yet.  REVIEW OF SYSTEMS: Full 14 system review of systems performed and notable only for those listed below and in HPI above, all others are negative:  Constitutional: chills  Cardiovascular: N/A  Ear/Nose/Throat: trouble swallowing  Skin: N/A  Eyes: N/A  Respiratory: N/A  Gastroitestinal: N/A  Genitourinary: N/A Hematology/Lymphatic: N/A  Endocrine: feeling cold  Musculoskeletal: N/A    Allergy/Immunology: N/A  Neurological: confusion, headache  Psychiatric: N/A Sleep: daytime sleepiness, snoring, sleep talking  The following represents the patient's updated allergies and side effects list: No Known Allergies  Labs since last visit of relevance include the following: Results for orders placed or performed during the hospital encounter of 02/08/14  CBC WITH DIFFERENTIAL  Result Value Ref Range   WBC 6.3 4.0 - 10.5 K/uL   RBC 4.79 3.87 - 5.11 MIL/uL   Hemoglobin 13.2 12.0 - 15.0 g/dL   HCT 40.9 36.0 - 46.0 %   MCV 85.4 78.0 - 100.0 fL   MCH 27.6 26.0 - 34.0 pg   MCHC 32.3 30.0 - 36.0 g/dL   RDW 13.6 11.5 - 15.5 %   Platelets 252 150 - 400 K/uL   Neutrophils Relative % 51 43 - 77 %   Neutro Abs 3.2 1.7 - 7.7 K/uL   Lymphocytes Relative 34 12 - 46 %   Lymphs Abs 2.1 0.7 - 4.0 K/uL   Monocytes Relative 10 3 - 12 %   Monocytes Absolute 0.6 0.1 - 1.0 K/uL   Eosinophils Relative 3 0 - 5 %   Eosinophils Absolute 0.2 0.0 - 0.7 K/uL   Basophils Relative 2 (H) 0 - 1 %   Basophils Absolute 0.1 0.0 - 0.1 K/uL  Comprehensive metabolic panel  Result Value Ref Range   Sodium 138 137 - 147 mEq/L   Potassium 4.3 3.7 - 5.3 mEq/L   Chloride 101 96 - 112 mEq/L  CO2 24 19 - 32 mEq/L   Glucose, Bld 105 (H) 70 - 99 mg/dL   BUN 29 (H) 6 - 23 mg/dL   Creatinine, Ser 1.34 (H) 0.50 - 1.10 mg/dL   Calcium 9.4 8.4 - 10.5 mg/dL   Total Protein 7.9 6.0 - 8.3 g/dL   Albumin 3.6 3.5 - 5.2 g/dL   AST 24 0 - 37 U/L   ALT 41 (H) 0 - 35 U/L   Alkaline Phosphatase 70 39 - 117 U/L   Total Bilirubin 0.4 0.3 - 1.2 mg/dL   GFR calc non Af Amer 40 (L) >90 mL/min   GFR calc Af Amer 46 (L) >90 mL/min   Anion gap 13 5 - 15    The neurologically relevant items on the patient's problem list were reviewed on today's visit.  Neurologic Examination  A problem focused neurological exam (12 or more points of the single system neurologic examination, vital signs counts as 1 point, cranial nerves  count for 8 points) was performed.  Blood pressure 121/65, pulse 68, height 5\' 4"  (1.626 m), weight 197 lb 6.4 oz (89.54 kg).  General - obese, well developed, in no apparent distress.  Ophthalmologic - not able to see through.  Cardiovascular - Regular rate and rhythm with no murmur.  Mental Status -  Level of arousal and orientation to time, place, and person were intact. Language including expression, naming, repetition, comprehension was assessed and found intact.  Cranial Nerves II - XII - II - Visual field intact OU. III, IV, VI - Extraocular movements intact. V - Facial sensation intact bilaterally. VII - Facial movement intact bilaterally. VIII - Hearing & vestibular intact bilaterally. X - Palate elevates symmetrically. XI - Chin turning & shoulder shrug intact bilaterally. XII - Tongue protrusion intact.  Motor Strength - The patient's strength was normal in all extremities and pronator drift was absent.  Bulk was normal and fasciculations were absent.   Motor Tone - Muscle tone was assessed at the neck and appendages and was normal.  Reflexes - The patient's reflexes were normal in all extremities and she had no pathological reflexes.  Sensory - Light touch, temperature/pinprick slightly decreased on the left UE and LE.    Coordination - The patient had normal movements in the hands and feet with no ataxia or dysmetria.  Tremor was absent.  Gait and Station - slow, wide based but stable.  Data reviewed: I personally reviewed the images and agree with the radiology interpretations.  Ct Head Wo Contrast  02/03/2014 9:35am IMPRESSION: Right thalamic hemorrhage with ventricular extension unchanged from yesterday.  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 This also blood in the right lateral ventricle.  04/27/14 - Slightly abnormal CT scan of the head showing mild changes of chronic microvascular ischemia. The previously  described right thalamic hemorrhage seems to have resolved near completely CUS - 1-39% internal carotid artery stenosis bilaterally. Vertebral arteries are patent with antegrade flow.  Echo - - Left ventricle: The cavity size was normal. Wall thickness was normal. Systolic function was vigorous. The estimated ejection fraction was in the range of 65% to 70%. Doppler parameters are consistent with abnormal left ventricular relaxation (grade 1 diastolic dysfunction). - Mitral valve: Calcified annulus. Mildly thickened leaflets .  LDL 136 and A1C 5.8 and TSH normal range  Assessment: As you may recall, she is a 69 y.o. 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 BP was high initially but later controlled well with medications. She is now on amlodipine and metoprolol and BP normal in clinic today. She needs continued to check BP and take the medications. Repeat Ct showed resolution of blood product so ASA 81mg  was added for stroke prevention. Her LDL high but she stopped lipitor. She has OSA but not on CPAP yet.  Plan:  - continue ASA 81mg  for stroke prevention - continue lipitor for HLD and stroke prevention - check BP at home - working on CPAP for OSA treatment - Follow up with your primary care physician for stroke risk factor modification. Recommend maintain blood pressure goal <130/80, diabetes with hemoglobin A1c goal below 6.5% and lipids with LDL cholesterol goal below 70 mg/dL.  - RTC in 6 months.  No orders of the defined types were placed in this encounter.    Patient Instructions  - continue ASA for stroke prevention - you need to continue the lipitor (atorvastatin) prescribed to you for high cholesterol and stroke prevention - you have sleep apnea and you should be on CPAP. Please consider to start CPAP - ask PCP for a prescription of BP home monitoring device and check BP at home -  Follow up with your primary care physician for stroke risk factor modification. Recommend maintain blood pressure goal <130/80, diabetes with hemoglobin A1c goal below 6.5% and lipids with LDL cholesterol goal below 70 mg/dL.  - no restriction for driving but your family need to ride with you for 2-3 times initially and if you and your family feel comfortable with driving, I am OK with you to drive - follow up in 6 months.    Rosalin Hawking, MD PhD [facility] [address] [place], [place] [address] [phone]

## 2014-07-14 NOTE — Patient Instructions (Signed)
-   continue ASA for stroke prevention - you need to continue the lipitor (atorvastatin) prescribed to you for high cholesterol and stroke prevention - you have sleep apnea and you should be on CPAP. Please consider to start CPAP - ask PCP for a prescription of BP home monitoring device and check BP at home - Follow up with your primary care physician for stroke risk factor modification. Recommend maintain blood pressure goal <130/80, diabetes with hemoglobin A1c goal below 6.5% and lipids with LDL cholesterol goal below 70 mg/dL.  - no restriction for driving but your family need to ride with you for 2-3 times initially and if you and your family feel comfortable with driving, I am OK with you to drive - follow up in 6 months.

## 2014-08-29 ENCOUNTER — Other Ambulatory Visit: Payer: Self-pay | Admitting: *Deleted

## 2014-08-30 ENCOUNTER — Other Ambulatory Visit: Payer: Self-pay | Admitting: Internal Medicine

## 2014-08-30 MED ORDER — AMLODIPINE BESYLATE 10 MG PO TABS: 10.0000 mg | ORAL_TABLET | Freq: Every day | ORAL | Status: DC

## 2014-11-11 ENCOUNTER — Encounter: Payer: Self-pay | Admitting: *Deleted

## 2015-01-12 ENCOUNTER — Ambulatory Visit: Payer: Medicare HMO | Admitting: Neurology

## 2015-01-13 ENCOUNTER — Encounter: Payer: Self-pay | Admitting: Neurology

## 2015-01-31 ENCOUNTER — Other Ambulatory Visit: Payer: Self-pay | Admitting: Internal Medicine

## 2015-02-07 ENCOUNTER — Telehealth: Payer: Self-pay | Admitting: Internal Medicine

## 2015-02-07 NOTE — Telephone Encounter (Signed)
Call to patient to confirm appointment for 02/08/15 at 9:45 no answer

## 2015-02-08 ENCOUNTER — Encounter: Payer: Commercial Managed Care - HMO | Admitting: Internal Medicine

## 2015-04-26 ENCOUNTER — Encounter: Payer: Self-pay | Admitting: Internal Medicine

## 2015-06-07 ENCOUNTER — Ambulatory Visit (INDEPENDENT_AMBULATORY_CARE_PROVIDER_SITE_OTHER): Payer: Commercial Managed Care - HMO | Admitting: Internal Medicine

## 2015-06-07 ENCOUNTER — Encounter: Payer: Self-pay | Admitting: Internal Medicine

## 2015-06-07 VITALS — BP 141/59 | HR 56 | Temp 98.1°F | Ht 64.0 in | Wt 187.6 lb

## 2015-06-07 DIAGNOSIS — I10 Essential (primary) hypertension: Secondary | ICD-10-CM

## 2015-06-07 DIAGNOSIS — N183 Chronic kidney disease, stage 3 unspecified: Secondary | ICD-10-CM

## 2015-06-07 DIAGNOSIS — I610 Nontraumatic intracerebral hemorrhage in hemisphere, subcortical: Secondary | ICD-10-CM

## 2015-06-07 DIAGNOSIS — E785 Hyperlipidemia, unspecified: Secondary | ICD-10-CM | POA: Diagnosis not present

## 2015-06-07 DIAGNOSIS — I619 Nontraumatic intracerebral hemorrhage, unspecified: Secondary | ICD-10-CM

## 2015-06-07 DIAGNOSIS — I517 Cardiomegaly: Secondary | ICD-10-CM | POA: Diagnosis not present

## 2015-06-07 DIAGNOSIS — H538 Other visual disturbances: Secondary | ICD-10-CM | POA: Diagnosis not present

## 2015-06-07 DIAGNOSIS — I129 Hypertensive chronic kidney disease with stage 1 through stage 4 chronic kidney disease, or unspecified chronic kidney disease: Secondary | ICD-10-CM | POA: Diagnosis not present

## 2015-06-07 DIAGNOSIS — K219 Gastro-esophageal reflux disease without esophagitis: Secondary | ICD-10-CM

## 2015-06-07 DIAGNOSIS — M545 Low back pain: Secondary | ICD-10-CM

## 2015-06-07 DIAGNOSIS — G8929 Other chronic pain: Secondary | ICD-10-CM

## 2015-06-07 DIAGNOSIS — Z0000 Encounter for general adult medical examination without abnormal findings: Secondary | ICD-10-CM

## 2015-06-07 MED ORDER — ATORVASTATIN CALCIUM 20 MG PO TABS: 20.0000 mg | ORAL_TABLET | Freq: Every day | ORAL | Status: DC
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
## 2015-06-07 NOTE — Assessment & Plan Note (Signed)
She does not report any acute symptoms today.  She saw Optho, Dr. Katy Fitch, December 2015 and she had no obvious pathology, they did note a cataract.  She is wearing glasses.  Due to return for 1 year appointment.
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
## 2015-06-07 NOTE — Assessment & Plan Note (Signed)
Mammogram 2015 is benign Dexa 2013 is normal, consider repeating in future Colonoscopy 2013 with hyperplastic polyp only, repeat in 10 years PAP: She reports never having an abnormal and no procedures.  She is > 68 years old so likely will not need another pelvic exam.  We discussed today and she is inclined to not have another at this time.   She declines flu shot today.

## 2015-06-07 NOTE — Assessment & Plan Note (Signed)
She reports the back pain has been ongoing for many years, but she is able to rest to improve it.  She is not interested in pills to help with the pain, even OTC medications.   Continue to monitor for any worsening.

## 2015-06-07 NOTE — Assessment & Plan Note (Signed)
She reports that she used to have bad reflux, but this no longer bothers her.  Continue to monitor.

## 2015-06-07 NOTE — Assessment & Plan Note (Signed)
TTE in 2015 with normal EF and grade 1 dCHF, likely related to chronic HTN.

## 2015-06-07 NOTE — Assessment & Plan Note (Signed)
She is doing well with very minimal residual deficit (on exam, her strength was intact and equal on each side).   She has been taking her blood pressure medications and her BP is well controlled today.   Continue to monitor.  F/U with Neurology prn

## 2015-06-07 NOTE — Patient Instructions (Signed)
[name] - -   It was very nice to meet you today!  Please start taking your atorvastatin again once it comes from your pharmacy.  Atorvastatin 20mg  daily .  Please continue taking your other medications as prescribed.   Please get your mammogram when scheduled to check for breast cancer.   Please make an appointment with me in about 6 months.   Thank you!  Atorvastatin tablets What is this medicine? ATORVASTATIN (a TORE va sta tin) is known as a HMG-CoA reductase inhibitor or 'statin'. It lowers the level of cholesterol and triglycerides in the blood. This drug may also reduce the risk of heart attack, stroke, or other health problems in patients with risk factors for heart disease. Diet and lifestyle changes are often used with this drug. This medicine may be used for other purposes; ask your health care provider or pharmacist if you have questions. What should I tell my health care provider before I take this medicine? They need to know if you have any of these conditions: -frequently drink alcoholic beverages -history of stroke, TIA -kidney disease -liver disease -muscle aches or weakness -other medical condition -an unusual or allergic reaction to atorvastatin, other medicines, foods, dyes, or preservatives -pregnant or trying to get pregnant -breast-feeding How should I use this medicine? Take this medicine by mouth with a glass of water. Follow the directions on the prescription label. You can take this medicine with or without food. Take your doses at regular intervals. Do not take your medicine more often than directed. Talk to your pediatrician regarding the use of this medicine in children. While this drug may be prescribed for children as young as 35 years old for selected conditions, precautions do apply. Overdosage: If you think you have taken too much of this medicine contact a poison control center or emergency room at once. NOTE: This medicine is only for you. Do  not share this medicine with others. What if I miss a dose? If you miss a dose, take it as soon as you can. If it is almost time for your next dose, take only that dose. Do not take double or extra doses. What may interact with this medicine? Do not take this medicine with any of the following medications: -red yeast rice -telaprevir -telithromycin -voriconazole This medicine may also interact with the following medications: -alcohol -antiviral medicines for HIV or AIDS -boceprevir -certain antibiotics like clarithromycin, erythromycin, troleandomycin -certain medicines for cholesterol like fenofibrate or gemfibrozil -cimetidine -clarithromycin -colchicine -cyclosporine -digoxin -female hormones, like estrogens or progestins and birth control pills -grapefruit juice -medicines for fungal infections like fluconazole, itraconazole, ketoconazole -niacin -rifampin -spironolactone This list may not describe all possible interactions. Give your health care provider a list of all the medicines, herbs, non-prescription drugs, or dietary supplements you use. Also tell them if you smoke, drink alcohol, or use illegal drugs. Some items may interact with your medicine. What should I watch for while using this medicine? Visit your doctor or health care professional for regular check-ups. You may need regular tests to make sure your liver is working properly. Tell your doctor or health care professional right away if you get any unexplained muscle pain, tenderness, or weakness, especially if you also have a fever and tiredness. Your doctor or health care professional may tell you to stop taking this medicine if you develop muscle problems. If your muscle problems do not go away after stopping this medicine, contact your health care professional. This drug is only  part of a total heart-health program. Your doctor or a dietician can suggest a low-cholesterol and low-fat diet to help. Avoid alcohol and  smoking, and keep a proper exercise schedule. Do not use this drug if you are pregnant or breast-feeding. Serious side effects to an unborn child or to an infant are possible. Talk to your doctor or pharmacist for more information. This medicine may affect blood sugar levels. If you have diabetes, check with your doctor or health care professional before you change your diet or the dose of your diabetic medicine. If you are going to have surgery tell your health care professional that you are taking this drug. What side effects may I notice from receiving this medicine? Side effects that you should report to your doctor or health care professional as soon as possible: -allergic reactions like skin rash, itching or hives, swelling of the face, lips, or tongue -dark urine -fever -joint pain -muscle cramps, pain -redness, blistering, peeling or loosening of the skin, including inside the mouth -trouble passing urine or change in the amount of urine -unusually weak or tired -yellowing of eyes or skin Side effects that usually do not require medical attention (report to your doctor or health care professional if they continue or are bothersome): -constipation -heartburn -stomach gas, pain, upset This list may not describe all possible side effects. Call your doctor for medical advice about side effects. You may report side effects to FDA at 1-800-FDA-1088. Where should I keep my medicine? Keep out of the reach of children. Store at room temperature between 20 to 25 degrees C (68 to 77 degrees F). Throw away any unused medicine after the expiration date. NOTE: This sheet is a summary. It may not cover all possible information. If you have questions about this medicine, talk to your doctor, pharmacist, or health care provider.    2016, [employer]vier/Gold Standard. (2011-04-23 FT:[id number])

## 2015-06-07 NOTE — Assessment & Plan Note (Signed)
Doing well, no urinary symptoms noted on history today.  Her Cr has been stable around 1.2 - 1.5 for a while.  Will recheck BMET today.

## 2015-06-07 NOTE — Assessment & Plan Note (Signed)
She has been out of her atorvastatin for a while.  She reports not being sure if she was supposed to take it given she had no further refills.    Check Lipid panel Refill Atorvastatin.

## 2015-06-08 ENCOUNTER — Encounter: Payer: Self-pay | Admitting: Internal Medicine

## 2015-06-08 LAB — CMP14 + ANION GAP
ALK PHOS: 54 IU/L (ref 39–117)
ALT: 10 IU/L (ref 0–32)
AST: 8 IU/L (ref 0–40)
Albumin/Globulin Ratio: 1.4 (ref 1.1–2.5)
Albumin: 4.2 g/dL (ref 3.6–4.8)
Anion Gap: 13 mmol/L (ref 10.0–18.0)
BUN / CREAT RATIO: 23 (ref 11–26)
BUN: 27 mg/dL (ref 8–27)
Bilirubin Total: 0.4 mg/dL (ref 0.0–1.2)
CO2: 24 mmol/L (ref 18–29)
CREATININE: 1.19 mg/dL — AB (ref 0.57–1.00)
Calcium: 9.4 mg/dL (ref 8.7–10.3)
Chloride: 103 mmol/L (ref 96–106)
GFR calc Af Amer: 54 mL/min/{1.73_m2} — ABNORMAL LOW (ref >59)
GFR calc non Af Amer: 47 mL/min/{1.73_m2} — ABNORMAL LOW (ref >59)
GLOBULIN, TOTAL: 3.1 g/dL (ref 1.5–4.5)
Glucose: 86 mg/dL (ref 65–99)
Potassium: 4.5 mmol/L (ref 3.5–5.2)
SODIUM: 140 mmol/L (ref 134–144)
Total Protein: 7.3 g/dL (ref 6.0–8.5)

## 2015-06-08 LAB — LIPID PANEL
CHOLESTEROL TOTAL: 166 mg/dL (ref 100–199)
Chol/HDL Ratio: 2.5 ratio units (ref 0.0–4.4)
HDL: 66 mg/dL (ref >39)
LDL CALC: 85 mg/dL (ref 0–99)
Triglycerides: 76 mg/dL (ref 0–149)
VLDL Cholesterol Cal: 15 mg/dL (ref 5–40)

## 2015-06-28 ENCOUNTER — Ambulatory Visit
Admission: RE | Admit: 2015-06-28 | Discharge: 2015-06-28 | Disposition: A | Discharge status: Home / Self Care | Payer: Medicare HMO | Source: Ambulatory Visit | Attending: Internal Medicine | Admitting: Internal Medicine

## 2015-06-28 DIAGNOSIS — Z1231 Encounter for screening mammogram for malignant neoplasm of breast: Secondary | ICD-10-CM | POA: Diagnosis not present

## 2015-06-28 DIAGNOSIS — Z0000 Encounter for general adult medical examination without abnormal findings: Secondary | ICD-10-CM

## 2015-08-28 ENCOUNTER — Other Ambulatory Visit: Payer: Self-pay

## 2015-08-28 MED ORDER — AMLODIPINE BESYLATE 10 MG PO TABS: 10.0000 mg | ORAL_TABLET | Freq: Every day | ORAL | Status: DC

## 2015-08-28 NOTE — Telephone Encounter (Signed)
Needs June appt PCP [facility] routine F/U

## 2015-09-04 ENCOUNTER — Emergency Department (HOSPITAL_COMMUNITY)
Admission: EM | Admit: 2015-09-04 | Discharge: 2015-09-04 | Disposition: A | Discharge status: Home / Self Care | Payer: Commercial Managed Care - HMO | Source: Home / Self Care | Attending: Family Medicine | Admitting: Family Medicine

## 2015-09-04 ENCOUNTER — Encounter (HOSPITAL_COMMUNITY): Payer: Self-pay | Admitting: Emergency Medicine

## 2015-09-04 DIAGNOSIS — IMO0002 Reserved for concepts with insufficient information to code with codable children: Secondary | ICD-10-CM

## 2015-09-04 DIAGNOSIS — N644 Mastodynia: Secondary | ICD-10-CM | POA: Diagnosis not present

## 2015-09-04 DIAGNOSIS — R222 Localized swelling, mass and lump, trunk: Secondary | ICD-10-CM | POA: Diagnosis not present

## 2015-09-04 DIAGNOSIS — N62 Hypertrophy of breast: Secondary | ICD-10-CM

## 2015-09-04 MED ORDER — CEPHALEXIN 500 MG PO CAPS: 500.0000 mg | ORAL_CAPSULE | Freq: Four times a day (QID) | ORAL | Status: DC

## 2015-09-04 NOTE — ED Notes (Signed)
Pt here with right axillary palpable lump palpated yesterday Pain with lifting arm, throb sensation Last mammogram 5 months ago, normal

## 2015-09-04 NOTE — ED Provider Notes (Signed)
CSN: [id number]     Arrival date & time 09/04/15  69 History   First MD Initiated Contact with Patient 09/04/15 1515     Chief Complaint  Patient presents with  . Mass   (Consider location/radiation/quality/duration/timing/severity/associated sxs/prior Treatment) HPI Comments: 70 year old female discovered a mass under the right arm today while getting dressed. She is also noticed that her right breast is larger than the left. This is a new finding states this is not normal for her. She denied having tenderness to the mass until it was palpated. She denies fever, chills or other systemic symptoms. She apparently had a normal mammogram 5 months ago. Denies nipple pain, discharge or discolorations.   Past Medical History  Diagnosis Date  . GERD (gastroesophageal reflux disease)   . Arthritis   . Hypertension   . Shortness of breath   . Sleep apnea    Past Surgical History  Procedure Laterality Date  . No past surgeries     Family History  Problem Relation Age of Onset  . Heart disease Mother   . Cancer Brother    Social History  Substance Use Topics  . Smoking status: Former Smoker -- 2.00 packs/day    Types: Cigarettes    Start date: 06/17/1968  . Smokeless tobacco: Never Used     Comment: Quit in 20s  . Alcohol Use: No     Comment: former drinker for approximately 10 years   OB History    No data available     Review of Systems  Constitutional: Negative for fever, activity change and fatigue.  HENT: Negative.   Respiratory: Negative.   Cardiovascular: Negative.   Genitourinary: Negative.   Musculoskeletal: Positive for myalgias and back pain. Negative for joint swelling.       Patient also complaining of back pain. This is primarily located to the trapezius and supraspinatus musculature. She states that recently she was chopping down a tree.  Skin: Negative for color change, rash and wound.  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  All other systems reviewed and are negative.   Allergies  Review of patient's allergies indicates no known allergies.  Home Medications   Prior to Admission medications   Medication Sig Start Date End Date Taking? Authorizing Provider  acetaminophen (TYLENOL) 500 MG tablet Take 500 mg by mouth every 6 (six) hours as needed.    Historical Provider, MD  amLODipine (NORVASC) 10 MG tablet Take 1 tablet (10 mg total) by mouth daily. 08/28/15   Bartholomew Crews, MD  aspirin EC 81 MG tablet Take 1 tablet (81 mg total) by mouth daily. 04/27/14   Rosalin Hawking, MD  atorvastatin (LIPITOR) 20 MG tablet Take 1 tablet (20 mg total) by mouth daily. 06/07/15   [name], MD  cephALEXin (KEFLEX) 500 MG capsule Take 1 capsule (500 mg total) by mouth 4 (four) times daily. 09/04/15   Janne Napoleon, NP  metoprolol succinate (TOPROL-XL) 25 MG 24 hr tablet TAKE 1 TABLET EVERY DAY 02/02/15   [name], MD   Meds Ordered and Administered this Visit  Medications - No data to display  BP 161/84 mmHg  Pulse 61  Temp(Src) 97.7 F (36.5 C) (Oral)  Resp 16  SpO2 97% No data found.   Physical Exam  Constitutional: She is oriented to person, place, and time. She appears well-developed and well-nourished. No distress.  Eyes: EOM are normal.  Neck: Normal range of motion. Neck supple.  Cardiovascular: Normal rate.   Pulmonary/Chest: Effort normal. No respiratory distress.  There is a roughly rectangular subcutaneous mass measuring 6 cm x 6 cm inferior to the right axilla fossa. It is partially mobile. During palpation and testing for mobility produces tenderness. At this time the patient notes that her right breast is  larger than the right. Sharee Pimple, RN entered the room as a Producer, television/film/video. The breasts were examined. There is a noticeable difference in size of the right breast compared to the left. Right breast is larger. There is some firmness to the breast proper that is not present in  the left. There is minor nipple tenderness in the right that is not in the left. No nipple discharge, bleeding, inversion or other anatomical changes. No overlying skin changes.  Musculoskeletal: She exhibits no edema.  Tenderness to the right trapezius and supraspinatus musculature. Minor tenderness to the right medial trapezius muscle in the para thoracic musculature. No spinal tenderness. This pain is reproduced by arm movement and shoulder elevation.  Lymphadenopathy:    She has no cervical adenopathy.  Neurological: She is alert and oriented to person, place, and time. She exhibits normal muscle tone. Coordination normal.  Skin: Skin is warm and dry.  Psychiatric: She has a normal mood and affect.  Nursing note and vitals reviewed.   ED Course  Procedures (including critical care time)  Labs Review Labs Reviewed - No data to display  Imaging Review No results found.   Visual Acuity Review  Right Eye Distance:   Left Eye Distance:   Bilateral Distance:    Right Eye Near:   Left Eye Near:    Bilateral Near:         MDM   1. Mass of right chest wall   2. Breast enlargement   3. Breast tenderness in female    For now you may apply warm compresses. Take the Keflex antibiotic as directed. Silverio Lay primary care doctor today for follow-up appointment. General need additional testing as described on page one. Not postponed for procrastinate and obtaining an appointment soon. Meds ordered this encounter  Medications  . cephALEXin (KEFLEX) 500 MG capsule    Sig: Take 1 capsule (500 mg total) by mouth 4 (four) times daily.    Dispense:  28 capsule    Refill:  0    Order Specific Question:  Supervising Provider    Answer:  Ihor Gully D [id number]   Pt and husband st understands importance of seeing PCP promptly for additional testing.    Janne Napoleon, NP 09/04/15 1549

## 2015-09-04 NOTE — Discharge Instructions (Signed)
Breast Tenderness For now you may apply warm compresses. Take the Keflex antibiotic as directed. Silverio Lay primary care doctor today for follow-up appointment. General need additional testing as described on page one. Not postponed for procrastinate and obtaining an appointment soon. Breast tenderness is a common problem for women of all ages. Breast tenderness may cause mild discomfort to severe pain. It has a variety of causes. Your health care provider will find out the likely cause of your breast tenderness by examining your breasts, asking you about symptoms, and ordering some tests. Breast tenderness usually does not mean you have breast cancer. HOME CARE INSTRUCTIONS  Breast tenderness often can be handled at home. You can try:  Getting fitted for a new bra that provides more support, especially during exercise.  Wearing a more supportive bra or sports bra while sleeping when your breasts are very tender.  If you have a breast injury, apply ice to the area:  Put ice in a plastic bag.  Place a towel between your skin and the bag.  Leave the ice on for 20 minutes, 2-3 times a day.  If your breasts are too full of milk as a result of breastfeeding, try:  Expressing milk either by hand or with a breast pump.  Applying a warm compress to the breasts for relief.  Taking over-the-counter pain relievers, if approved by your health care provider.  Taking other medicines that your health care provider prescribes. These may include antibiotic medicines or birth control pills. Over the long term, your breast tenderness might be eased if you:  Cut down on caffeine.  Reduce the amount of fat in your diet. Keep a log of the days and times when your breasts are most tender. This will help you and your health care provider find the cause of the tenderness and how to relieve it. Also, learn how to do breast exams at home. This will help you notice if you have an unusual growth or lump that  could cause tenderness. SEEK MEDICAL CARE IF:   Any part of your breast is hard, red, and hot to the touch. This could be a sign of infection.  Fluid is coming out of your nipples (and you are not breastfeeding). Especially watch for blood or pus.  You have a fever as well as breast tenderness.  You have a new or painful lump in your breast that remains after your menstrual period ends.  You have tried to take care of the pain at home, but it has not gone away.  Your breast pain is getting worse, or the pain is making it hard to do the things you usually do during your day.   This information is not intended to replace advice given to you by your health care provider. Make sure you discuss any questions you have with your health care provider.   Document Released: 05/16/2008 Document Revised: 02/03/2013 Document Reviewed: 12/31/2012 Elsevier Interactive Patient Education [employer].

## 2015-09-08 ENCOUNTER — Telehealth: Payer: Self-pay | Admitting: Internal Medicine

## 2015-09-08 NOTE — Telephone Encounter (Signed)
Spoke with patient and sch an appointment on 09/13/2015 with Dr. Daryll Drown.

## 2015-09-13 ENCOUNTER — Encounter: Payer: Commercial Managed Care - HMO | Admitting: Internal Medicine

## 2015-09-14 ENCOUNTER — Telehealth: Payer: Self-pay | Admitting: Internal Medicine

## 2015-09-14 NOTE — Telephone Encounter (Signed)
APPT. REMINDER CALL, NO ANSWER, NO VOICE MAIL °

## 2015-09-18 ENCOUNTER — Ambulatory Visit (INDEPENDENT_AMBULATORY_CARE_PROVIDER_SITE_OTHER): Payer: Commercial Managed Care - HMO | Admitting: Internal Medicine

## 2015-09-18 ENCOUNTER — Encounter: Payer: Self-pay | Admitting: Internal Medicine

## 2015-09-18 VITALS — BP 164/77 | HR 62 | Temp 98.0°F | Ht 64.0 in | Wt 185.2 lb

## 2015-09-18 DIAGNOSIS — R2231 Localized swelling, mass and lump, right upper limb: Secondary | ICD-10-CM

## 2015-09-18 DIAGNOSIS — Z0000 Encounter for general adult medical examination without abnormal findings: Secondary | ICD-10-CM

## 2015-09-18 DIAGNOSIS — I10 Essential (primary) hypertension: Secondary | ICD-10-CM | POA: Diagnosis not present

## 2015-09-18 NOTE — Assessment & Plan Note (Signed)
-  needs prevnar 

## 2015-09-18 NOTE — Patient Instructions (Signed)
Thank you for your visit today.   Please return to the internal medicine clinic in about 1 month(s) or sooner if needed.   We will obtain a CT scan of your neck and chest to define the etiology of your right axilla swelling. We will also check lab work today. We will call you and let you know the results of your scan.   Continue to take all medications. You do not need to continue taking the antibiotic (keflex).   Please be sure to bring all of your medications with you to every visit; this includes herbal supplements, vitamins, eye drops, and any over-the-counter medications.   Should you have any questions regarding your medications and/or any new or worsening symptoms, please be sure to call the clinic at [phone].   If you believe that you are suffering from a life threatening condition or one that may result in the loss of limb or function, then you should call 911 and proceed to the nearest Emergency Department.   A healthy lifestyle and preventative care can promote health and wellness.   Maintain regular health, dental, and eye exams.  Eat a healthy diet. Foods like vegetables, fruits, whole grains, low-fat dairy products, and lean protein foods contain the nutrients you need without too many calories. Decrease your intake of foods high in solid fats, added sugars, and salt. Get information about a proper diet from your caregiver, if necessary.  Regular physical exercise is one of the most important things you can do for your health. Most adults should get at least 150 minutes of moderate-intensity exercise (any activity that increases your heart rate and causes you to sweat) each week. In addition, most adults need muscle-strengthening exercises on 2 or more days a week.   Maintain a healthy weight. The body mass index (BMI) is a screening tool to identify possible weight problems. It provides an estimate of body fat based on height and weight. Your caregiver can help  determine your BMI, and can help you achieve or maintain a healthy weight. For adults 20 years and older:  A BMI below 18.5 is considered underweight.  A BMI of 18.5 to 24.9 is normal.  A BMI of 25 to 29.9 is considered overweight.  A BMI of 30 and above is considered obese.

## 2015-09-18 NOTE — Assessment & Plan Note (Signed)
BP slightly elevated today at 164/77.   -she will f/u in 1 month

## 2015-09-18 NOTE — Progress Notes (Signed)
Patient ID: [name], female   DOB: 03-10-1946, 70 y.o.   MRN: [id number]     Subjective:   Patient ID: [name] female    DOB: 03-02-1946 70 y.o.    MRN: [id number] Health Maintenance Due: Health Maintenance Due  Topic Date Due  . Hepatitis C Screening  1945/07/05  . ZOSTAVAX  11/03/2006  . PNA vac Low Risk Adult (2 of 2 - PCV13) 03/05/2013    _________________________________________________  HPI: [name] is a 70 y.o. female here for a follow up of a right axilla mass?  She was seen at urgent care on 3/20 for a right axilla mass and right breast enlargement.  No chills/fever.  She was given keflex which she completed.  Mammogram 06/2015 was negative.    Pt has a PMH outlined below.  Please see problem-based charting assessment and plan for further status of patient's chronic medical problems addressed at today's visit.  PMH: Past Medical History  Diagnosis Date  . GERD (gastroesophageal reflux disease)   . Arthritis   . Hypertension   . Shortness of breath   . Sleep apnea     Medications: Current Outpatient Prescriptions on File Prior to Visit  Medication Sig Dispense Refill  . acetaminophen (TYLENOL) 500 MG tablet Take 500 mg by mouth every 6 (six) hours as needed.    Marland Kitchen amLODipine (NORVASC) 10 MG tablet Take 1 tablet (10 mg total) by mouth daily. 90 tablet 3  . aspirin EC 81 MG tablet Take 1 tablet (81 mg total) by mouth daily. 90 tablet 3  . atorvastatin (LIPITOR) 20 MG tablet Take 1 tablet (20 mg total) by mouth daily. 90 tablet 3  . cephALEXin (KEFLEX) 500 MG capsule Take 1 capsule (500 mg total) by mouth 4 (four) times daily. 28 capsule 0  . metoprolol succinate (TOPROL-XL) 25 MG 24 hr tablet TAKE 1 TABLET EVERY DAY 90 tablet 11   No current facility-administered medications on file prior to visit.    Allergies: No Known Allergies  FH: Family History  Problem Relation Age of Onset  . Heart disease Mother   . Cancer Brother      SH: Social History   Social History  . Marital Status: Married    Spouse Name: N/A  . Number of Children: 4  . Years of Education: 8 TH   Social History Main Topics  . Smoking status: Former Smoker -- 2.00 packs/day    Types: Cigarettes    Start date: 06/17/1968  . Smokeless tobacco: Never Used     Comment: Quit in 67s  . Alcohol Use: No     Comment: former drinker for approximately 10 years  . Drug Use: No  . Sexual Activity: Not on file   Other Topics Concern  . Not on file   Social History Narrative   Patient is married with 3 children still living and 1 deceased son.   Patient is right handed.   Patient has 8 th grade education.   Patient drinks 2-3 servings daily.    Review of Systems: Constitutional: Negative for fever, chills and +weight loss.  Respiratory: Negative for cough and shortness of breath.  Cardiovascular: Negative for chest pain. Musculoskeletal: Negative for myalgias. Skin: Negative for rash.    Objective:   Vital Signs: Filed Vitals:   09/18/15 0836  BP: 164/77  Pulse: 62  Temp: 98 F (36.7 C)  TempSrc: Oral  Height: 5\' 4"  (1.626 m)  Weight: 185 lb 3.2  oz (84.006 kg)  SpO2: 99%      BP Readings from Last 3 Encounters:  09/18/15 164/77  09/04/15 161/84  06/07/15 141/59    Physical Exam: Constitutional: Vital signs reviewed.  Patient is in NAD and cooperative with exam.  Head: Normocephalic and atraumatic. Eyes: EOMI, conjunctivae nl, no scleral icterus.  Neck: Supple, no lymphadenopathy appeciated.  Pulmonary/Chest: normal effort. Breast/Axilla: Right axilla with ~5x5cm firm, somewhat mobile mass, non-tender to palpation.  Left axilla normal.  No overlying erythema, warmth.  R and L breast normal appearing without discharge or mass.   Neurological: A&O x3, cranial nerves II-XII are grossly intact, moving all extremities. Skin: Warm, dry and intact. No rash.   Assessment & Plan:   Assessment and plan was discussed and  formulated with my attending.

## 2015-09-18 NOTE — Assessment & Plan Note (Addendum)
Pt reports having a mass in the right axilla that she first noticed around 09/04/15 for which she went to urgent care and was given a course of keflex which she somewhat completed.  She did not realize she needed to take it 4 times daily and had only been taking it once daily.  She denies fever/chills, night sweats, or other lumps.  She does report having some weight loss which she attributes to her stroke.  Reports a good appetite.  She had a normal mammogram 06/2015.  She reports the mass has not grown in size and has not been painful, red, or itchy.  Also reports the right breast is larger but I did not appreciate this.  Denies any nipple discharge or lumps in the breast.  She has a fairly substantial right axilla mass ~5 x 5cm which is firm, somewhat irregular, and somewhat mobile.  Possible etiology lymphoma, cyst?  Does not appear to be an infectious process.   -will check cbc/diff, BMP -will obtain CT neck/chest with contrast  -we will let her know of the results -f/u in 1 month

## 2015-09-19 LAB — CBC WITH DIFFERENTIAL/PLATELET
BASOS ABS: 0.1 10*3/uL (ref 0.0–0.2)
BASOS: 3 %
EOS (ABSOLUTE): 0.3 10*3/uL (ref 0.0–0.4)
Eos: 6 %
HEMOGLOBIN: 11.7 g/dL (ref 11.1–15.9)
Hematocrit: 37.2 % (ref 34.0–46.6)
IMMATURE GRANS (ABS): 0 10*3/uL (ref 0.0–0.1)
Immature Granulocytes: 0 %
LYMPHS ABS: 1.3 10*3/uL (ref 0.7–3.1)
LYMPHS: 28 %
MCH: 27.3 pg (ref 26.6–33.0)
MCHC: 31.5 g/dL (ref 31.5–35.7)
MCV: 87 fL (ref 79–97)
Monocytes Absolute: 0.6 10*3/uL (ref 0.1–0.9)
Monocytes: 13 %
NEUTROS ABS: 2.3 10*3/uL (ref 1.4–7.0)
Neutrophils: 50 %
Platelets: 273 10*3/uL (ref 150–379)
RBC: 4.29 x10E6/uL (ref 3.77–5.28)
RDW: 14.2 % (ref 12.3–15.4)
WBC: 4.6 10*3/uL (ref 3.4–10.8)

## 2015-09-19 LAB — BMP8+ANION GAP
Anion Gap: 17 mmol/L (ref 10.0–18.0)
BUN / CREAT RATIO: 20 (ref 12–28)
BUN: 22 mg/dL (ref 8–27)
CALCIUM: 9.6 mg/dL (ref 8.7–10.3)
CHLORIDE: 101 mmol/L (ref 96–106)
CO2: 23 mmol/L (ref 18–29)
CREATININE: 1.1 mg/dL — AB (ref 0.57–1.00)
GFR, EST AFRICAN AMERICAN: 60 mL/min/{1.73_m2} (ref >59)
GFR, EST NON AFRICAN AMERICAN: 52 mL/min/{1.73_m2} — AB (ref >59)
GLUCOSE: 87 mg/dL (ref 65–99)
Potassium: 4.9 mmol/L (ref 3.5–5.2)
Sodium: 141 mmol/L (ref 134–144)

## 2015-09-19 NOTE — Progress Notes (Signed)
Internal Medicine Clinic Attending  I saw and evaluated the patient.  I personally confirmed the key portions of the history and exam documented by Dr. Gordy Levan and I reviewed pertinent patient test results.  The assessment, diagnosis, and plan were formulated together and I agree with the documentation in the resident's note.  The mass in her right axilla is approximately 4x4 cm and has some high risk features; it is firm, not freely mobile, deep, and not tender. There is also some cervical lymphadenopathy with several 1-2 cm nodes on her left side. No significant "B symptoms" now, no history of malignancy. I don't see any cellulitis or skin findings to explain this large mass as reactive lymphadenopathy. Plan for CBC with differential today, then arrange for expedited CT of the neck and chest within the next few days. If it is a solid mass we will need to refer to general surgery for excision/biopsy.

## 2015-09-27 ENCOUNTER — Ambulatory Visit (HOSPITAL_COMMUNITY): Payer: Commercial Managed Care - HMO

## 2015-09-27 ENCOUNTER — Ambulatory Visit (HOSPITAL_COMMUNITY)
Admission: RE | Admit: 2015-09-27 | Discharge: 2015-09-27 | Disposition: A | Discharge status: Home / Self Care | Payer: Commercial Managed Care - HMO | Source: Ambulatory Visit | Attending: Student in an Organized Health Care Education/Training Program | Admitting: Student in an Organized Health Care Education/Training Program

## 2015-09-27 DIAGNOSIS — N62 Hypertrophy of breast: Secondary | ICD-10-CM | POA: Diagnosis not present

## 2015-09-27 DIAGNOSIS — I517 Cardiomegaly: Secondary | ICD-10-CM | POA: Diagnosis not present

## 2015-09-27 DIAGNOSIS — N63 Unspecified lump in breast: Secondary | ICD-10-CM | POA: Diagnosis not present

## 2015-09-27 DIAGNOSIS — R2231 Localized swelling, mass and lump, right upper limb: Secondary | ICD-10-CM | POA: Insufficient documentation

## 2015-09-27 DIAGNOSIS — M47892 Other spondylosis, cervical region: Secondary | ICD-10-CM | POA: Diagnosis not present

## 2015-09-27 DIAGNOSIS — R234 Changes in skin texture: Secondary | ICD-10-CM | POA: Insufficient documentation

## 2015-09-27 DIAGNOSIS — R590 Localized enlarged lymph nodes: Secondary | ICD-10-CM | POA: Diagnosis not present

## 2015-09-27 DIAGNOSIS — R221 Localized swelling, mass and lump, neck: Secondary | ICD-10-CM | POA: Diagnosis not present

## 2015-09-27 MED ORDER — IOPAMIDOL (ISOVUE-300) INJECTION 61%
INTRAVENOUS | Status: AC
  Administered 2015-09-27: 75 mL via INTRAVENOUS
  Filled 2015-09-27: qty 100

## 2015-09-27 MED ORDER — IOPAMIDOL (ISOVUE-300) INJECTION 61%
INTRAVENOUS | Status: AC
  Filled 2015-09-27: qty 50

## 2015-09-27 MED ORDER — IOPAMIDOL (ISOVUE-300) INJECTION 61%
INTRAVENOUS | Status: AC
  Filled 2015-09-27: qty 75

## 2015-10-02 ENCOUNTER — Other Ambulatory Visit: Payer: Self-pay | Admitting: Internal Medicine

## 2015-10-09 ENCOUNTER — Ambulatory Visit (INDEPENDENT_AMBULATORY_CARE_PROVIDER_SITE_OTHER): Payer: Commercial Managed Care - HMO | Admitting: Internal Medicine

## 2015-10-09 ENCOUNTER — Encounter: Payer: Self-pay | Admitting: Internal Medicine

## 2015-10-09 VITALS — BP 141/62 | HR 57 | Temp 97.5°F | Ht 64.0 in | Wt 187.8 lb

## 2015-10-09 DIAGNOSIS — R590 Localized enlarged lymph nodes: Secondary | ICD-10-CM | POA: Diagnosis not present

## 2015-10-09 NOTE — Patient Instructions (Signed)
Thank you for your visit today.   Please return to the internal medicine clinic in May for your follow up appointment.      We will refer you to general surgery for a biopsy of your lymph node.   Please let us know if you have any further questions.   Please be sure to bring all of your medications with you to every visit; this includes herbal supplements, vitamins, eye drops, and any over-the-counter medications.   Should you have any questions regarding your medications and/or any new or worsening symptoms, please be sure to call the clinic at [phone].   If you believe that you are suffering from a life threatening condition or one that may result in the loss of limb or function, then you should call 911 and proceed to the nearest Emergency Department.   A healthy lifestyle and preventative care can promote health and wellness.   Maintain regular health, dental, and eye exams.  Eat a healthy diet. Foods like vegetables, fruits, whole grains, low-fat dairy products, and lean protein foods contain the nutrients you need without too many calories. Decrease your intake of foods high in solid fats, added sugars, and salt. Get information about a proper diet from your caregiver, if necessary.  Regular physical exercise is one of the most important things you can do for your health. Most adults should get at least 150 minutes of moderate-intensity exercise (any activity that increases your heart rate and causes you to sweat) each week. In addition, most adults need muscle-strengthening exercises on 2 or more days a week.   Maintain a healthy weight. The body mass index (BMI) is a screening tool to identify possible weight problems. It provides an estimate of body fat based on height and weight. Your caregiver can help determine your BMI, and can help you achieve or maintain a healthy weight. For adults 20 years and older:  A BMI below 18.5 is considered underweight.  A BMI of 18.5 to  24.9 is normal.  A BMI of 25 to 29.9 is considered overweight.  A BMI of 30 and above is considered obese.

## 2015-10-09 NOTE — Assessment & Plan Note (Addendum)
Pt noticed right axillary swelling about 1 month ago.  Denies any systemic symptoms, no weight loss, fever, night sweats.  No history of breast cancer.  Pt had normal mammogram 06/2015.  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 The right breast is enlarged with increased soft tissue attenuation, skin thickening and possible nipple retraction. There are bulky right axillary lymph nodes with smaller right supraclavicular lymph nodes."  We discussed the results today and questions were answered.   -referral to surgery  -return on May 3

## 2015-10-09 NOTE — Progress Notes (Signed)
Patient ID: [name], female   DOB: Apr 11, 1946, 70 y.o.   MRN: [id number]     Subjective:   Patient ID: [name] female    DOB: 08/28/1945 70 y.o.    MRN: [id number] Health Maintenance Due: Health Maintenance Due  Topic Date Due  . Hepatitis C Screening  [birth year]  . ZOSTAVAX  11/03/2006  . PNA vac Low Risk Adult (2 of 2 - PCV13) 03/05/2013    _________________________________________________  HPI: [name] is a 70 y.o. female here for a follow up visit.  Pt has a PMH outlined below.  Please see problem-based charting assessment and plan for further status of patient's chronic medical problems addressed at today's visit.  PMH: Past Medical History  Diagnosis Date  . GERD (gastroesophageal reflux disease)   . Arthritis   . Hypertension   . Shortness of breath   . Sleep apnea     Medications: Current Outpatient Prescriptions on File Prior to Visit  Medication Sig Dispense Refill  . acetaminophen (TYLENOL) 500 MG tablet Take 500 mg by mouth every 6 (six) hours as needed.    Marland Kitchen amLODipine (NORVASC) 10 MG tablet Take 1 tablet (10 mg total) by mouth daily. 90 tablet 3  . aspirin EC 81 MG tablet Take 1 tablet (81 mg total) by mouth daily. 90 tablet 3  . atorvastatin (LIPITOR) 20 MG tablet Take 1 tablet (20 mg total) by mouth daily. 90 tablet 3  . metoprolol succinate (TOPROL-XL) 25 MG 24 hr tablet TAKE 1 TABLET EVERY DAY 90 tablet 11   No current facility-administered medications on file prior to visit.    Allergies: No Known Allergies  FH: Family History  Problem Relation Age of Onset  . Heart disease Mother   . Cancer Brother     SH: Social History   Social History  . Marital Status: Married    Spouse Name: N/A  . Number of Children: 4  . Years of Education: 8 TH   Social History Main Topics  . Smoking status: Former Smoker -- 2.00 packs/day    Types: Cigarettes    Start date: 06/17/1968  . Smokeless tobacco: Never Used   Comment: Quit in 12s  . Alcohol Use: No     Comment: former drinker for approximately 10 years  . Drug Use: No  . Sexual Activity: Not Asked   Other Topics Concern  . None   Social History Narrative   Patient is married with 3 children still living and 1 deceased son.   Patient is right handed.   Patient has 8 th grade education.   Patient drinks 2-3 servings daily.    Review of Systems: Constitutional: Negative for fever, chills, weight loss.  Respiratory: Negative for cough and shortness of breath.  Cardiovascular: Negative for chest pain.  Gastrointestinal: Negative for nausea, vomiting. Neurological: Negative for dizziness.   Objective:   Vital Signs: Filed Vitals:   10/09/15 1420  BP: 141/62  Pulse: 57  Temp: 97.5 F (36.4 C)  TempSrc: Oral  Height: 5\' 4"  (1.626 m)  Weight: 187 lb 12.8 oz (85.186 kg)  SpO2: 100%      BP Readings from Last 3 Encounters:  10/09/15 141/62  09/18/15 164/77  09/04/15 161/84    Physical Exam: Constitutional: Vital signs reviewed.  Patient is in NAD and cooperative with exam.  Head: Normocephalic and atraumatic. Eyes: EOMI, conjunctivae nl, no scleral icterus.  Neck: Supple. Cardiovascular: RRR, no MRG. Pulmonary/Chest: Normal effort, CTAB, no  wheezes, rales, or rhonchi. Right axillary lymphadenopathy.  Neurological: A&O x3, cranial nerves II-XII are grossly intact, moving all extremities. Skin: Warm, dry and intact. No rash.   Assessment & Plan:   Assessment and plan was discussed and formulated with my attending.

## 2015-10-11 NOTE — Progress Notes (Signed)
Case discussed with Dr. Gill soon after the resident saw the patient.  We reviewed the resident's history and exam and pertinent patient test results.  I agree with the assessment, diagnosis, and plan of care documented in the resident's note. 

## 2015-10-16 ENCOUNTER — Telehealth: Payer: Self-pay | Admitting: Internal Medicine

## 2015-10-16 NOTE — Telephone Encounter (Signed)
APPT. REMINDER CALL, NO ANSWER °

## 2015-10-18 ENCOUNTER — Encounter: Payer: Self-pay | Admitting: Internal Medicine

## 2015-10-18 ENCOUNTER — Ambulatory Visit (INDEPENDENT_AMBULATORY_CARE_PROVIDER_SITE_OTHER): Payer: Commercial Managed Care - HMO | Admitting: Internal Medicine

## 2015-10-18 VITALS — BP 148/81 | HR 62 | Temp 98.1°F | Ht 64.0 in | Wt 186.5 lb

## 2015-10-18 DIAGNOSIS — R590 Localized enlarged lymph nodes: Secondary | ICD-10-CM | POA: Diagnosis not present

## 2015-10-18 DIAGNOSIS — Z23 Encounter for immunization: Secondary | ICD-10-CM | POA: Diagnosis not present

## 2015-10-18 DIAGNOSIS — Z79899 Other long term (current) drug therapy: Secondary | ICD-10-CM | POA: Diagnosis not present

## 2015-10-18 DIAGNOSIS — E785 Hyperlipidemia, unspecified: Secondary | ICD-10-CM

## 2015-10-18 NOTE — Progress Notes (Addendum)
Physician-pharmacy co-visit Medications were reviewed with the patient.  Findings: he states she is unsure of whether she has received atorvastatin from pharmacy. [facility] [place] and technician stated atorvastatin 90-day supply was filled and sent to patient March 2017. Patient states she will check at home to be sure she is taking the atorvastatin. Updated patient's preferred pharmacy.  Patient reports adherence challenge including remembering to take her medications. Pill box provided and filled.  Recommendation: patient may benefit from ongoing medication education at future follow-up visits.  Patient verbalized understanding by repeating back information and was advised to contact me if further medication-related questions arise. Patient was also provided contact information.  Patient was seen in clinic with Tamala Julian, PharmD, PGY1 pharmacy resident, Marylou Flesher, PharmD Candidate.

## 2015-10-18 NOTE — Progress Notes (Signed)
   Subjective:    Patient ID: [name], female    DOB: 1945-08-22, 71 y.o.   MRN: [id number]  HPI [name] is a 70 year old female with hypertension, history of stroke, hyperlipidemia, obesity who presents today for right axillary lymphadenopathy. Please see assessment & plan for status of chronic medical problems.  As noted below, she should have some formal cognitive testing at her follow-up visit to assess whether or not memory loss as a barrier to medication adherence.  Review of Systems  Constitutional: Negative for fever, activity change, appetite change, fatigue and unexpected weight change.  Musculoskeletal: Negative for neck pain.  Hematological:       Lymphadenopathy, improved       Objective:   Physical Exam  Constitutional: She is oriented to person, place, and time. She appears well-developed and well-nourished.  HENT:  Head: Normocephalic and atraumatic.  Eyes: Conjunctivae are normal. No scleral icterus.  Cardiovascular: Normal rate and regular rhythm.   Pulmonary/Chest: Effort normal and breath sounds normal. No respiratory distress.  Unable to appreciate axillary lymphadenopathy on either side  Neurological: She is alert and oriented to person, place, and time.  Skin: Skin is warm and dry.          Assessment & Plan:

## 2015-10-18 NOTE — Progress Notes (Signed)
Internal Medicine Clinic Attending  Case discussed with Dr. Patel,Rushil at the time of the visit.  We reviewed the resident's history and exam and pertinent patient test results.  I agree with the assessment, diagnosis, and plan of care documented in the resident's note.  

## 2015-10-18 NOTE — Assessment & Plan Note (Signed)
Overview Since her last visit, she reports marked improvement in her right axillary lymphadenopathy. She is scheduled to follow-up with general surgery on 5/11. She is aware that the most concerning cause of this swelling could be cancer, especially since her father had neck cancer and her mother had metastatic cancer of unknown primary. She continues to deny constitutional symptoms, like fever, weight loss, change in appetite/energy, axillary pain.  Assessment Right axillary lymphadenopathy with imaging findings concerning for malignancy.  Plan -Encouraged her to follow-up. Upon further review of the EMR, it appears there are no scheduled appointments for this patient other than [facility] appointments. RN Lela spoke with general surgery office to confirm the appointment on May 11 at 10:15 AM.

## 2015-10-18 NOTE — Assessment & Plan Note (Signed)
Overview She is confused about her medications today. It appears she has not refilled Lipitor 20 mg. The bottle that she does have with her is dated 06/08/15. She cannot remember if this medication was refilled by her mail-order pharmacy and if so, she could not remember where she may have placed it at home. She also produced 2 bottles of metoprolol XL 25 mg dated 04/24/15 and 07/09/15. I asked her to show me which bottle she would take her medicine, and she had difficulty deciding between the 2 bottles until I showed her where she did find the date written on the bottle.  Assessment Hyperlipidemia, at risk for nonadherence to possible memory loss.  Plan -Spoke with clinic pharmacy team who said that with her and recommended pillbox and will also recheck out to [facility] to figure out why the medication has not been refilled. -Recommend memory loss assessment at follow-up visit as this could be a significant barrier to her adherence and subsequent increase in risk for recurrent stroke

## 2015-10-18 NOTE — Patient Instructions (Signed)
Please come back in one month so that we make sure you have all your medicines and that the arm swelling is improving.

## 2015-10-18 NOTE — Assessment & Plan Note (Signed)
Overview She is eligible for Prevnar 13 today. She is reluctant to receive his immunization given that she has had soreness following her flu shot several months ago.  Assessment At risk for pneumococcal infection given her age and comorbidities  Plan -Counseled patient on the benefits and pneumococcal immunization to which she was agreeable, so we administered Prevnar 13 today

## 2015-10-26 DIAGNOSIS — R2231 Localized swelling, mass and lump, right upper limb: Secondary | ICD-10-CM | POA: Diagnosis not present

## 2015-10-27 ENCOUNTER — Other Ambulatory Visit: Payer: Self-pay | Admitting: Surgery

## 2015-10-27 DIAGNOSIS — N63 Unspecified lump in unspecified breast: Secondary | ICD-10-CM

## 2015-10-31 ENCOUNTER — Other Ambulatory Visit: Payer: Self-pay | Admitting: Surgery

## 2015-10-31 DIAGNOSIS — N63 Unspecified lump in unspecified breast: Secondary | ICD-10-CM

## 2015-11-01 ENCOUNTER — Other Ambulatory Visit: Payer: Self-pay | Admitting: General Surgery

## 2015-11-01 DIAGNOSIS — K56609 Unspecified intestinal obstruction, unspecified as to partial versus complete obstruction: Secondary | ICD-10-CM

## 2015-11-14 ENCOUNTER — Other Ambulatory Visit: Payer: Self-pay | Admitting: Surgery

## 2015-11-14 ENCOUNTER — Ambulatory Visit
Admission: RE | Admit: 2015-11-14 | Discharge: 2015-11-14 | Disposition: A | Discharge status: Home / Self Care | Payer: Commercial Managed Care - HMO | Source: Ambulatory Visit | Attending: Surgery | Admitting: Surgery

## 2015-11-14 DIAGNOSIS — N63 Unspecified lump in unspecified breast: Secondary | ICD-10-CM

## 2015-11-14 DIAGNOSIS — R922 Inconclusive mammogram: Secondary | ICD-10-CM | POA: Diagnosis not present

## 2015-11-14 DIAGNOSIS — R2231 Localized swelling, mass and lump, right upper limb: Secondary | ICD-10-CM

## 2015-11-14 DIAGNOSIS — R590 Localized enlarged lymph nodes: Secondary | ICD-10-CM | POA: Diagnosis not present

## 2015-11-15 ENCOUNTER — Ambulatory Visit: Payer: Commercial Managed Care - HMO | Admitting: Internal Medicine

## 2015-11-17 ENCOUNTER — Ambulatory Visit (INDEPENDENT_AMBULATORY_CARE_PROVIDER_SITE_OTHER): Payer: Commercial Managed Care - HMO | Admitting: Internal Medicine

## 2015-11-17 ENCOUNTER — Encounter: Payer: Self-pay | Admitting: Internal Medicine

## 2015-11-17 VITALS — BP 155/67 | HR 58 | Temp 98.0°F | Ht 64.0 in | Wt 186.6 lb

## 2015-11-17 DIAGNOSIS — R590 Localized enlarged lymph nodes: Secondary | ICD-10-CM

## 2015-11-17 NOTE — Progress Notes (Signed)
   Subjective:    Patient ID: [name], female    DOB: 27-Sep-1945, 70 y.o.   MRN: [id number]  HPI Comments: [name] is a 70 year old woman with PMH as below here for follow-up of right axillary mass.  She recently underwent mammogram and right axillary imaging which suggests highly suspicious axillary mass (BiRADs 4) and she is scheduled for US-guided biopsy on 06/15.  She has no complaints today.  She has no personal cancer hx but she does have family hx - 1st cousin on dad's side with breast cancer in her 76s, her father had H&N cancer.  No ovarian, uterine or GI malignancies in the family.     Past Medical History  Diagnosis Date  . GERD (gastroesophageal reflux disease)   . Arthritis   . Hypertension   . Shortness of breath   . Sleep apnea    Current Outpatient Prescriptions on File Prior to Visit  Medication Sig Dispense Refill  . amLODipine (NORVASC) 10 MG tablet Take 1 tablet (10 mg total) by mouth daily. 90 tablet 3  . aspirin EC 81 MG tablet Take 1 tablet (81 mg total) by mouth daily. 90 tablet 3  . atorvastatin (LIPITOR) 20 MG tablet Take 1 tablet (20 mg total) by mouth daily. 90 tablet 3  . metoprolol succinate (TOPROL-XL) 25 MG 24 hr tablet TAKE 1 TABLET EVERY DAY 90 tablet 11   No current facility-administered medications on file prior to visit.    Review of Systems  Constitutional: Negative for fever, chills, appetite change and unexpected weight change.  Respiratory: Negative for cough and shortness of breath.   Gastrointestinal: Positive for constipation. Negative for nausea, vomiting, diarrhea and blood in stool.       Occasional constipation that responds to OTC meds.  Endocrine:       No nipple discharge or bleeding.       Filed Vitals:   11/17/15 1330  BP: 155/67  Pulse: 58  Temp: 98 F (36.7 C)  TempSrc: Oral  Height: 5\' 4"  (1.626 m)  Weight: 186 lb 9.6 oz (84.641 kg)  SpO2: 100%    Objective:   Physical Exam  Constitutional: She is  oriented to person, place, and time. She appears well-developed. No distress.  HENT:  Head: Normocephalic and atraumatic.  Mouth/Throat: Oropharynx is clear and moist. No oropharyngeal exudate.  Eyes: Conjunctivae and EOM are normal. Pupils are equal, round, and reactive to light. No scleral icterus.  Neck: Neck supple.  Large right axillary lymph node, non-tender, non-mobile  Cardiovascular: Normal rate, regular rhythm and normal heart sounds.  Exam reveals no gallop and no friction rub.   No murmur heard. Pulmonary/Chest: Effort normal and breath sounds normal. No respiratory distress. She has no wheezes. She has no rales.  Right breast is larger than left.  There are no palpable breast nodules.  Nipples do not appear retracted.    Abdominal: Soft. Bowel sounds are normal. She exhibits no distension. There is no tenderness. There is no rebound and no guarding.  Neurological: She is alert and oriented to person, place, and time. No cranial nerve deficit.  Skin: Skin is warm. She is not diaphoretic.  Psychiatric: She has a normal mood and affect. Her behavior is normal. Judgment and thought content normal.  Vitals reviewed.         Assessment & Plan:  Please see problem based charting for A&P.

## 2015-11-17 NOTE — Patient Instructions (Signed)
1. Please be sure to keep your biopsy appointment on 06/15.     2. Please take all medications as prescribed.    3. If you have worsening of your symptoms or new symptoms arise, please call the clinic [phone] or go to the ER immediately if symptoms are severe.   Please call our office if you are still not able to get your atorvastatin. Follow-up with Dr. Daryll Drown on 06/21.

## 2015-11-18 NOTE — Assessment & Plan Note (Addendum)
Assessment:  Imaging and exam are suspicious for malignancy.  She will undergo US guided biopsy in two weeks (06/15). Plan:  US guided biopsy of right axillary lesion on 06/15.  She will follow-up with her PCP here in clinic as previously scheduled (06/21).  Will send for records from [place].

## 2015-11-20 NOTE — Progress Notes (Signed)
Internal Medicine Clinic Attending  Case discussed with Dr. Wilson soon after the resident saw the patient.  We reviewed the resident's history and exam and pertinent patient test results.  I agree with the assessment, diagnosis, and plan of care documented in the resident's note.  

## 2015-11-27 ENCOUNTER — Emergency Department (HOSPITAL_COMMUNITY)
Admission: EM | Admit: 2015-11-27 | Discharge: 2015-11-28 | Disposition: A | Discharge status: Home / Self Care | Payer: Commercial Managed Care - HMO | Attending: Emergency Medicine | Admitting: Emergency Medicine

## 2015-11-27 DIAGNOSIS — Z79899 Other long term (current) drug therapy: Secondary | ICD-10-CM | POA: Insufficient documentation

## 2015-11-27 DIAGNOSIS — Z87891 Personal history of nicotine dependence: Secondary | ICD-10-CM | POA: Insufficient documentation

## 2015-11-27 DIAGNOSIS — Z7982 Long term (current) use of aspirin: Secondary | ICD-10-CM | POA: Diagnosis not present

## 2015-11-27 DIAGNOSIS — R609 Edema, unspecified: Secondary | ICD-10-CM

## 2015-11-27 DIAGNOSIS — M7989 Other specified soft tissue disorders: Secondary | ICD-10-CM | POA: Diagnosis not present

## 2015-11-27 DIAGNOSIS — I10 Essential (primary) hypertension: Secondary | ICD-10-CM | POA: Diagnosis not present

## 2015-11-27 DIAGNOSIS — R600 Localized edema: Secondary | ICD-10-CM | POA: Insufficient documentation

## 2015-11-27 DIAGNOSIS — R0602 Shortness of breath: Secondary | ICD-10-CM | POA: Insufficient documentation

## 2015-11-27 NOTE — ED Notes (Signed)
No answer when called to be triaged

## 2015-11-27 NOTE — ED Notes (Signed)
Pt called with no answer

## 2015-11-28 ENCOUNTER — Emergency Department (HOSPITAL_COMMUNITY): Payer: Commercial Managed Care - HMO

## 2015-11-28 ENCOUNTER — Encounter (HOSPITAL_COMMUNITY): Payer: Self-pay | Admitting: *Deleted

## 2015-11-28 DIAGNOSIS — M7989 Other specified soft tissue disorders: Secondary | ICD-10-CM | POA: Diagnosis not present

## 2015-11-28 LAB — CBC WITH DIFFERENTIAL/PLATELET
Basophils Absolute: 0 10*3/uL (ref 0.0–0.1)
Basophils Relative: 1 %
EOS PCT: 4 %
Eosinophils Absolute: 0.2 10*3/uL (ref 0.0–0.7)
HCT: 31.7 % — ABNORMAL LOW (ref 36.0–46.0)
Hemoglobin: 9.9 g/dL — ABNORMAL LOW (ref 12.0–15.0)
LYMPHS ABS: 1.1 10*3/uL (ref 0.7–4.0)
LYMPHS PCT: 18 %
MCH: 26.5 pg (ref 26.0–34.0)
MCHC: 31.2 g/dL (ref 30.0–36.0)
MCV: 85 fL (ref 78.0–100.0)
MONO ABS: 0.7 10*3/uL (ref 0.1–1.0)
MONOS PCT: 11 %
Neutro Abs: 4.1 10*3/uL (ref 1.7–7.7)
Neutrophils Relative %: 68 %
PLATELETS: 261 10*3/uL (ref 150–400)
RBC: 3.73 MIL/uL — ABNORMAL LOW (ref 3.87–5.11)
RDW: 13.2 % (ref 11.5–15.5)
WBC: 6.1 10*3/uL (ref 4.0–10.5)

## 2015-11-28 LAB — URINALYSIS, ROUTINE W REFLEX MICROSCOPIC
Bilirubin Urine: NEGATIVE
GLUCOSE, UA: NEGATIVE mg/dL
Hgb urine dipstick: NEGATIVE
Ketones, ur: NEGATIVE mg/dL
LEUKOCYTES UA: NEGATIVE
Nitrite: NEGATIVE
PROTEIN: NEGATIVE mg/dL
Specific Gravity, Urine: 1.016 (ref 1.005–1.030)
pH: 5 (ref 5.0–8.0)

## 2015-11-28 LAB — COMPREHENSIVE METABOLIC PANEL
ALT: 12 U/L — ABNORMAL LOW (ref 14–54)
AST: 13 U/L — ABNORMAL LOW (ref 15–41)
Albumin: 3.3 g/dL — ABNORMAL LOW (ref 3.5–5.0)
Alkaline Phosphatase: 50 U/L (ref 38–126)
Anion gap: 6 (ref 5–15)
BUN: 45 mg/dL — AB (ref 6–20)
CHLORIDE: 109 mmol/L (ref 101–111)
CO2: 23 mmol/L (ref 22–32)
Calcium: 9 mg/dL (ref 8.9–10.3)
Creatinine, Ser: 1.33 mg/dL — ABNORMAL HIGH (ref 0.44–1.00)
GFR calc Af Amer: 46 mL/min — ABNORMAL LOW (ref >60)
GFR, EST NON AFRICAN AMERICAN: 40 mL/min — AB (ref >60)
Glucose, Bld: 116 mg/dL — ABNORMAL HIGH (ref 65–99)
POTASSIUM: 3.8 mmol/L (ref 3.5–5.1)
Sodium: 138 mmol/L (ref 135–145)
Total Bilirubin: 0.1 mg/dL — ABNORMAL LOW (ref 0.3–1.2)
Total Protein: 7.3 g/dL (ref 6.5–8.1)

## 2015-11-28 LAB — I-STAT TROPONIN, ED: Troponin i, poc: 0.01 ng/mL (ref 0.00–0.08)

## 2015-11-28 LAB — BRAIN NATRIURETIC PEPTIDE: B NATRIURETIC PEPTIDE 5: 23.3 pg/mL (ref 0.0–100.0)

## 2015-11-28 MED ORDER — TRAMADOL HCL 50 MG PO TABS: 50.0000 mg | ORAL_TABLET | Freq: Four times a day (QID) | ORAL | Status: DC | PRN

## 2015-11-28 MED ORDER — OXYCODONE-ACETAMINOPHEN 5-325 MG PO TABS
1.0000 | ORAL_TABLET | Freq: Once | ORAL | Status: AC
  Administered 2015-11-28: 1 via ORAL
  Filled 2015-11-28: qty 1

## 2015-11-28 MED ORDER — FUROSEMIDE 20 MG PO TABS: 20.0000 mg | ORAL_TABLET | Freq: Every day | ORAL | Status: DC

## 2015-11-28 NOTE — ED Provider Notes (Signed)
CSN: [id number]     Arrival date & time 11/27/15  2222 History   First MD Initiated Contact with Patient 11/28/15 0426     Chief Complaint  Patient presents with  . Swollen feet   . Swollen hands      (Consider location/radiation/quality/duration/timing/severity/associated sxs/prior Treatment) HPI Comments: Patient presents to the ER for evaluation of generalized swelling. Patient reports she started having swelling in her hands and feet last week. She went to the vitamin store and bought an over-the-counter supplement for swelling and has taken it for the last 3 days. She reports that her swelling has worsened and today she cannot ambulate because of pain and swelling in her legs. She is not expressing chest pain or shortness of breath. She does not take a diuretic. She does have a history of hypertension.   Past Medical History  Diagnosis Date  . GERD (gastroesophageal reflux disease)   . Arthritis   . Hypertension   . Shortness of breath   . Sleep apnea    Past Surgical History  Procedure Laterality Date  . No past surgeries     Family History  Problem Relation Age of Onset  . Heart disease Mother   . Cancer Brother    Social History  Substance Use Topics  . Smoking status: Former Smoker -- 2.00 packs/day    Types: Cigarettes    Start date: 06/17/1968  . Smokeless tobacco: Never Used     Comment: Quit in 69s  . Alcohol Use: No     Comment: former drinker for approximately 10 years   OB History    No data available     Review of Systems  Cardiovascular: Positive for leg swelling.  All other systems reviewed and are negative.     Allergies  Review of patient's allergies indicates no known allergies.  Home Medications   Prior to Admission medications   Medication Sig Start Date End Date Taking? Authorizing Provider  amLODipine (NORVASC) 10 MG tablet Take 1 tablet (10 mg total) by mouth daily. 08/28/15  Yes Bartholomew Crews, MD  aspirin EC 81 MG tablet  Take 1 tablet (81 mg total) by mouth daily. 04/27/14  Yes Rosalin Hawking, MD  atorvastatin (LIPITOR) 20 MG tablet Take 1 tablet (20 mg total) by mouth daily. 06/07/15  Yes [name], MD  metoprolol succinate (TOPROL-XL) 25 MG 24 hr tablet TAKE 1 TABLET EVERY DAY 02/02/15  Yes [name], MD  furosemide (LASIX) 20 MG tablet Take 1 tablet (20 mg total) by mouth daily. 11/28/15   Orpah Greek, MD  traMADol (ULTRAM) 50 MG tablet Take 1 tablet (50 mg total) by mouth every 6 (six) hours as needed. 11/28/15   Orpah Greek, MD   BP 152/74 mmHg  Pulse 63  Temp(Src) 98.6 F (37 C) (Oral)  Resp 19  SpO2 97% Physical Exam  Constitutional: She is oriented to person, place, and time. She appears well-developed and well-nourished. No distress.  HENT:  Head: Normocephalic and atraumatic.  Right Ear: Hearing normal.  Left Ear: Hearing normal.  Nose: Nose normal.  Mouth/Throat: Oropharynx is clear and moist and mucous membranes are normal.  Eyes: Conjunctivae and EOM are normal. Pupils are equal, round, and reactive to light.  Neck: Normal range of motion. Neck supple.  Cardiovascular: Regular rhythm, S1 normal and S2 normal.  Exam reveals no gallop and no friction rub.   No murmur heard. Pulmonary/Chest: Effort normal and breath sounds normal. No respiratory  distress. She exhibits no tenderness.  Abdominal: Soft. Normal appearance and bowel sounds are normal. There is no hepatosplenomegaly. There is no tenderness. There is no rebound, no guarding, no tenderness at McBurney's point and negative Murphy's sign. No hernia.  Musculoskeletal: Normal range of motion. She exhibits edema (hands and legs).  Neurological: She is alert and oriented to person, place, and time. She has normal strength. No cranial nerve deficit or sensory deficit. Coordination normal. GCS eye subscore is 4. GCS verbal subscore is 5. GCS motor subscore is 6.  Skin: Skin is warm, dry and intact. No rash noted. No  cyanosis.  Psychiatric: She has a normal mood and affect. Her speech is normal and behavior is normal. Thought content normal.  Nursing note and vitals reviewed.   ED Course  Procedures (including critical care time) Labs Review Labs Reviewed  CBC WITH DIFFERENTIAL/PLATELET - Abnormal; Notable for the following:    RBC 3.73 (*)    Hemoglobin 9.9 (*)    HCT 31.7 (*)    All other components within normal limits  COMPREHENSIVE METABOLIC PANEL - Abnormal; Notable for the following:    Glucose, Bld 116 (*)    BUN 45 (*)    Creatinine, Ser 1.33 (*)    Albumin 3.3 (*)    AST 13 (*)    ALT 12 (*)    Total Bilirubin 0.1 (*)    GFR calc non Af Amer 40 (*)    GFR calc Af Amer 46 (*)    All other components within normal limits  BRAIN NATRIURETIC PEPTIDE  URINALYSIS, ROUTINE W REFLEX MICROSCOPIC (NOT AT [facility])  Randolm Idol, ED    Imaging Review Dg Chest 2 View  11/28/2015  CLINICAL DATA:  Swelling in the hands and feet. EXAM: CHEST  2 VIEW COMPARISON:  12/12/2011 FINDINGS: Normal heart size and pulmonary vascularity. No focal airspace disease or consolidation in the lungs. No blunting of costophrenic angles. No pneumothorax. Mediastinal contours appear intact. Tortuous aorta. Degenerative changes in the thoracic spine. IMPRESSION: No evidence of active pulmonary disease. Electronically Signed   By: Lucienne Capers M.D.   On: 11/28/2015 01:48   I have personally reviewed and evaluated these images and lab results as part of my medical decision-making.   EKG Interpretation   Date/Time:  Tuesday November 28 2015 00:54:26 EDT Ventricular Rate:  66 PR Interval:  160 QRS Duration: 90 QT Interval:  414 QTC Calculation: 434 R Axis:   -18 Text Interpretation:  Sinus rhythm with Premature atrial complexes  Otherwise normal ECG Confirmed by POLLINA  MD, CHRISTOPHER [id number]) on  11/28/2015 4:27:06 AM      MDM   Final diagnoses:  Peripheral edema    Patient presents to the respiratory  complaints of swelling of her hands and feet. She also is experiencing pain in her joints. Symptoms have been ongoing for more than a week. She has had worsening in the last couple of days. Patient has diffuse edema of all 4 extremities. Etiology is unclear. She has a BNP of 23.3. She does not have any JVD or gallop on examination. X-ray does not show any evidence of edema. She has a normal troponin and has not experienced chest pain. EKG does not show any significant abnormality. Patient does have slightly elevated creatinine at 1.33. It appears that her baseline is between 1.2 and 1.5, however. Urinalysis did not show any evidence of proteinuria. Total protein in her comprehensive metabolic panel was normal but she does have low  albumin. Reviewing her records reveals echo 2 years ago that showed grade 1 diastolic dysfunction, no other abnormality.  I discussed the possibility of admission with her. She does not wish to be admitted at this time. She has a follow-up appointment with a surgeon in 2 days and does not want to miss the appointment. She was therefore given a prescription for 5 days of Lasix and Ultram for pain. She is to call her primary doctor in the internal medicine teaching clinic today to schedule prompt follow-up. If she has any worsening symptoms, return to the ER.      Orpah Greek, MD 11/28/15 [phone]

## 2015-11-28 NOTE — Discharge Instructions (Signed)
Edema °Edema is an abnormal buildup of fluids in your body tissues. Edema is somewhat dependent on gravity to pull the fluid to the lowest place in your body. That makes the condition more common in the legs and thighs (lower extremities). Painless swelling of the feet and ankles is common and becomes more likely as you get older. It is also common in looser tissues, like around your eyes.  °When the affected area is squeezed, the fluid may move out of that spot and leave a dent for a few moments. This dent is called pitting.  °CAUSES  °There are many possible causes of edema. Eating too much salt and being on your feet or sitting for a long time can cause edema in your legs and ankles. Hot weather may make edema worse. Common medical causes of edema include: °· Heart failure. °· Liver disease. °· Kidney disease. °· Weak blood vessels in your legs. °· Cancer. °· An injury. °· Pregnancy. °· Some medications. °· Obesity.  °SYMPTOMS  °Edema is usually painless. Your skin may look swollen or shiny.  °DIAGNOSIS  °Your health care provider may be able to diagnose edema by asking about your medical history and doing a physical exam. You may need to have tests such as X-rays, an electrocardiogram, or blood tests to check for medical conditions that may cause edema.  °TREATMENT  °Edema treatment depends on the cause. If you have heart, liver, or kidney disease, you need the treatment appropriate for these conditions. General treatment may include: °· Elevation of the affected body part above the level of your heart. °· Compression of the affected body part. Pressure from elastic bandages or support stockings squeezes the tissues and forces fluid back into the blood vessels. This keeps fluid from entering the tissues. °· Restriction of fluid and salt intake. °· Use of a water pill (diuretic). These medications are appropriate only for some types of edema. They pull fluid out of your body and make you urinate more often. This  gets rid of fluid and reduces swelling, but diuretics can have side effects. Only use diuretics as directed by your health care provider. °HOME CARE INSTRUCTIONS  °· Keep the affected body part above the level of your heart when you are lying down.   °· Do not sit still or stand for prolonged periods.   °· Do not put anything directly under your knees when lying down. °· Do not wear constricting clothing or garters on your upper legs.   °· Exercise your legs to work the fluid back into your blood vessels. This may help the swelling go down.   °· Wear elastic bandages or support stockings to reduce ankle swelling as directed by your health care provider.   °· Eat a low-salt diet to reduce fluid if your health care provider recommends it.   °· Only take medicines as directed by your health care provider.  °SEEK MEDICAL CARE IF:  °· Your edema is not responding to treatment. °· You have heart, liver, or kidney disease and notice symptoms of edema. °· You have edema in your legs that does not improve after elevating them.   °· You have sudden and unexplained weight gain. °SEEK IMMEDIATE MEDICAL CARE IF:  °· You develop shortness of breath or chest pain.   °· You cannot breathe when you lie down. °· You develop pain, redness, or warmth in the swollen areas.   °· You have heart, liver, or kidney disease and suddenly get edema. °· You have a fever and your symptoms suddenly get worse. °MAKE SURE YOU:  °·   Understand these instructions. °· Will watch your condition. °· Will get help right away if you are not doing well or get worse. °  °This information is not intended to replace advice given to you by your health care provider. Make sure you discuss any questions you have with your health care provider. °  °Document Released: 06/03/2005 Document Revised: 06/24/2014 Document Reviewed: 03/26/2013 °Elsevier Interactive Patient Education ©2016 [employer] ° °

## 2015-11-28 NOTE — ED Notes (Signed)
Bilateral hands and feet swelling

## 2015-11-30 ENCOUNTER — Other Ambulatory Visit: Payer: Self-pay | Admitting: Surgery

## 2015-11-30 ENCOUNTER — Ambulatory Visit
Admission: RE | Admit: 2015-11-30 | Discharge: 2015-11-30 | Disposition: A | Discharge status: Home / Self Care | Payer: Commercial Managed Care - HMO | Source: Ambulatory Visit | Attending: Surgery | Admitting: Surgery

## 2015-11-30 DIAGNOSIS — R2231 Localized swelling, mass and lump, right upper limb: Secondary | ICD-10-CM

## 2015-11-30 DIAGNOSIS — R590 Localized enlarged lymph nodes: Secondary | ICD-10-CM | POA: Diagnosis not present

## 2015-11-30 DIAGNOSIS — N6310 Unspecified lump in the right breast, unspecified quadrant: Secondary | ICD-10-CM

## 2015-11-30 DIAGNOSIS — C773 Secondary and unspecified malignant neoplasm of axilla and upper limb lymph nodes: Secondary | ICD-10-CM | POA: Diagnosis not present

## 2015-11-30 DIAGNOSIS — C801 Malignant (primary) neoplasm, unspecified: Secondary | ICD-10-CM | POA: Diagnosis not present

## 2015-12-05 ENCOUNTER — Telehealth: Payer: Self-pay | Admitting: Hematology

## 2015-12-05 NOTE — Telephone Encounter (Signed)
Scheduled an appointment with patient's husband with Dr. [name] on 6/30 at 11:15am. Instructed to arrive 30 minutes early. Mr. Pilapil verified wife's demographics and insurance information. Letter to referring and patient.

## 2015-12-06 ENCOUNTER — Encounter: Payer: Self-pay | Admitting: Hematology

## 2015-12-06 ENCOUNTER — Ambulatory Visit (INDEPENDENT_AMBULATORY_CARE_PROVIDER_SITE_OTHER): Payer: Commercial Managed Care - HMO | Admitting: Internal Medicine

## 2015-12-06 DIAGNOSIS — C799 Secondary malignant neoplasm of unspecified site: Secondary | ICD-10-CM

## 2015-12-06 DIAGNOSIS — M7989 Other specified soft tissue disorders: Secondary | ICD-10-CM

## 2015-12-06 DIAGNOSIS — R590 Localized enlarged lymph nodes: Secondary | ICD-10-CM | POA: Diagnosis not present

## 2015-12-06 DIAGNOSIS — R262 Difficulty in walking, not elsewhere classified: Secondary | ICD-10-CM | POA: Diagnosis not present

## 2015-12-06 DIAGNOSIS — C7989 Secondary malignant neoplasm of other specified sites: Secondary | ICD-10-CM

## 2015-12-06 MED ORDER — METHYLPREDNISOLONE 4 MG PO TBPK: ORAL_TABLET | ORAL | Status: DC

## 2015-12-06 MED ORDER — PREDNISONE 10 MG (21) PO TBPK: ORAL_TABLET | ORAL | Status: DC

## 2015-12-06 MED ORDER — DICLOFENAC SODIUM 1 % TD GEL: 4.0000 g | Freq: Four times a day (QID) | TRANSDERMAL | Status: DC

## 2015-12-06 NOTE — Patient Instructions (Signed)
General Instructions: - Stop taking Lasix - Can apply voltaren gel to knees up to 4 times daily - Blood work today - Start taking Prednisone dosepak: 60 mg x 1 day, 40 mg x 1 day, 20 mg x 1 day, 10 mg x 1 day, 5 mg until gone   Thank you for bringing your medicines today. This helps [place] keep you safe from mistakes.   Progress Toward Treatment Goals:  Treatment Goal 04/22/2014  Blood pressure deteriorated  Prevent falls at goal    Self Care Goals & Plans:  Self Care Goal 11/17/2015  Manage my medications take my medicines as prescribed; bring my medications to every visit; refill my medications on time  Monitor my health -  Eat healthy foods eat more vegetables; eat foods that are low in salt; eat baked foods instead of fried foods  Be physically active -  Prevent falls -    No flowsheet data found.   Care Management & Community Referrals:  No flowsheet data found.

## 2015-12-07 ENCOUNTER — Telehealth: Payer: Self-pay | Admitting: Internal Medicine

## 2015-12-07 ENCOUNTER — Telehealth: Payer: Self-pay

## 2015-12-07 LAB — CMP14 + ANION GAP
ALBUMIN: 3.8 g/dL (ref 3.6–4.8)
ALT: 7 IU/L (ref 0–32)
AST: 9 IU/L (ref 0–40)
Albumin/Globulin Ratio: 0.9 — ABNORMAL LOW (ref 1.2–2.2)
Alkaline Phosphatase: 59 IU/L (ref 39–117)
Anion Gap: 16 mmol/L (ref 10.0–18.0)
BILIRUBIN TOTAL: 0.4 mg/dL (ref 0.0–1.2)
BUN/Creatinine Ratio: 25 (ref 12–28)
BUN: 33 mg/dL — ABNORMAL HIGH (ref 8–27)
CHLORIDE: 105 mmol/L (ref 96–106)
CO2: 25 mmol/L (ref 18–29)
Calcium: 9.3 mg/dL (ref 8.7–10.3)
Creatinine, Ser: 1.3 mg/dL — ABNORMAL HIGH (ref 0.57–1.00)
GFR calc non Af Amer: 42 mL/min/{1.73_m2} — ABNORMAL LOW (ref >59)
GFR, EST AFRICAN AMERICAN: 48 mL/min/{1.73_m2} — AB (ref >59)
Globulin, Total: 4.1 g/dL (ref 1.5–4.5)
Glucose: 88 mg/dL (ref 65–99)
Potassium: 4.8 mmol/L (ref 3.5–5.2)
Sodium: 146 mmol/L — ABNORMAL HIGH (ref 134–144)
Total Protein: 7.9 g/dL (ref 6.0–8.5)

## 2015-12-07 LAB — LACTATE DEHYDROGENASE: LDH: 185 IU/L (ref 119–226)

## 2015-12-07 LAB — URIC ACID: Uric Acid: 6.7 mg/dL (ref 2.5–7.1)

## 2015-12-07 NOTE — Telephone Encounter (Signed)
voltaren gel is not covered by insurance will need PA, it is 247.00 w/o ins, pharm will direct them to otc gels to use, will do PA next week

## 2015-12-07 NOTE — Telephone Encounter (Signed)
Please call pt back regarding meds.  

## 2015-12-07 NOTE — Telephone Encounter (Signed)
diclofenac sodium (VOLTAREN) 1 % GEL predniSONE (STERAPRED UNI-PAK 21 TAB) 10 MG (21) TBPK tablet  rx given yesterday was sent to wrong pharmacy. Needs to be sent to [facility] on [address]

## 2015-12-07 NOTE — Progress Notes (Signed)
   Subjective:    Patient ID: [name], female    DOB: July 26, 1945, 70 y.o.   MRN: [id number]  HPI [name] is a 70yo woman with PMHx of HTN, OSA, and chronic back pain who presents today for evaluation of difficulty walking.   She reports for the last 2 weeks that she has been unable to walk and that this has been worsening. She describes feeling weak and that it takes significant effort to walk from her bedroom to the bathroom. She states she cannot sit on the commode because her legs are too weak to squat down. She describes taking "baby steps" when she walks. She reports she was able to walk down her front porch steps this morning but that this was difficult for her. She notes her legs and feet have a burning pain. She reports pain in her lower back but this has been present for many years. She denies any loss of bowel or bladder control and saddle anesthesia.   She also notes swelling in both of her hands and feet that started about 2 weeks ago as well. She denies any new medications other than Lasix and Tramadol that she was prescribed by the ED about 1 week ago for the swelling. She has been taking Lasix 20 mg daily but denies any improvement in the swelling of her hands/feet. She reports joint pains in her knees, but denies joint pains anywhere else. She denies fevers, chills, dry eyes, night sweats. She has lost about 22 lbs since Nov 2016. She notes a good appetite.   Of note, she had right axillary lymphadenopathy and underwent US guided biopsy which revealed metastatic poorly differentiated carcinoma. She has an appointment with Dr. [name] (Oncology) on 6/30.    Review of Systems General: See HPI HEENT: Denies headaches, ear pain, changes in vision, rhinorrhea, sore throat CV: Denies CP, palpitations, SOB, orthopnea Pulm: Denies SOB, cough, wheezing GI: Denies abdominal pain, nausea, vomiting, diarrhea, constipation, melena, hematochezia GU: Denies dysuria, hematuria,  frequency Msk: Denies muscle cramps Neuro: See HPI Skin: Denies rashes, bruising Psych: Denies depression, anxiety, hallucinations    Objective:   Physical Exam General: elderly woman sitting up in chair, NAD HEENT: [place]/AT, EOMI, sclera anicteric, mucus membranes moist  CV: RRR, no m/g/r Pulm: CTA bilaterally, breaths non-labored Abd: BS+, soft, obese, non-tender  Ext: Significant swelling of hands and feet, but no swelling of the legs or arms. No erythema or tenderness.  Neuro: alert and oriented x 3. Reflexes present and symmetric in both upper and lower extremities. Strength 5/5 in upper and lower extremities.     Assessment & Plan:  Please refer to A&P documentation.

## 2015-12-07 NOTE — Telephone Encounter (Signed)
Called both to [facility] [address]

## 2015-12-09 ENCOUNTER — Encounter: Payer: Self-pay | Admitting: Internal Medicine

## 2015-12-09 DIAGNOSIS — M7989 Other specified soft tissue disorders: Secondary | ICD-10-CM | POA: Insufficient documentation

## 2015-12-09 DIAGNOSIS — R262 Difficulty in walking, not elsewhere classified: Secondary | ICD-10-CM | POA: Insufficient documentation

## 2015-12-09 NOTE — Assessment & Plan Note (Addendum)
Unclear etiology for the swelling in both her hands and feet. Initially I was concerned about an inflammatory or autoimmune disease (rheumatoid arthritis, scleroderma), but I discussed this case with Dr. Beryle Beams and he thinks with her recent diagnosis of metastatic carcinoma of unknown primary it is unlikely to be two separate processes going on. We will check a uric acid as she could have some tumor lysis component going on and a LDH and CMET. Will try a trial of prednisone as well to see if that helps decrease the swelling. If her swelling persists, could consider autoimmune work up.

## 2015-12-09 NOTE — Assessment & Plan Note (Signed)
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 Given the location, the primary site is likely to be breast. She will follow up with Dr. [name] (Oncology) on 6/30.

## 2015-12-09 NOTE — Assessment & Plan Note (Addendum)
She has good strength on exam and her reflexes are symmetric and intact. It is more likely she is feeling weak from her newly found metastatic carcinoma of unknown primary. She will follow up with Dr. [name] on 6/30. In the meantime, I encouraged her husband who accompanied her to assist her as needed. May need to get PT involved. Will give her voltaren gel for her knee pain.

## 2015-12-10 NOTE — Progress Notes (Signed)
Medicine attending: I personally interviewed and briefly examined this patient on the day of the patient visit and reviewed pertinent clinical ,laboratory, and radiographic data  with resident physician Dr. Albin Felling and we discussed a management plan. This lady presented recently with a large axillary lymph node mass and a swollen but not erythematous right breast.  Skin thickening and nipple retraction on mammogram but no dominant mass. LN bx with poorly differentiated carcinoma. Focally ER positive.  This is breast cancer until proven otherwise.  Additional testing on biopsy pending. She already has Oncology appointment. She presents with unexplained soft tissue swelling of her hands and feet. Generalized weakness but motor strength 5/5. 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 We will Rx symptomatically with short course of steroids pending full Oncology evaluation. Check LDH & uric acid. Noete decline in renal function.

## 2015-12-14 ENCOUNTER — Ambulatory Visit: Payer: Commercial Managed Care - HMO | Admitting: Hematology

## 2015-12-14 ENCOUNTER — Ambulatory Visit: Payer: Commercial Managed Care - HMO | Admitting: Oncology

## 2015-12-14 NOTE — Progress Notes (Signed)
[facility]  [phone] [phone]  Clinic New Consult Note   Patient Care Team: [name], MD as PCP - General (Internal Medicine) 12/15/2015  Referring physician: Dr. 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 She is accompanied by her husband to the clinic today.  She noticed a lump at right axilla in 08/2015, no pain or other complains, she also report right breast swelling, no pain, skin erythema, nipple discharge or other complain. She otherwise feels well. She was initially seen at urgent care in March, then was referred to establish her care with prior care physician at [facility] internal medicine center. Her screening mammogram was negative in January 2017. She subsequently underwent CT chest on 09/27/2015 which showed enlarged and inflamed right breast, bulky right axillary lymph nodes. She was referred to breast Center for diagnostic mammogram and ultrasound of right breast, which was negative. She underwent ultrasound guided right axillary node biopsy on 12/05/2015, which reviewed port differentiated carcinoma. ER weakly focally positive.  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 She works independently. She lives with her husband. She denies any fever, night sweats, chills, or skin itchiness.  MEDICAL HISTORY:  Past Medical History  Diagnosis Date  . GERD (gastroesophageal reflux disease)   . Arthritis   . Hypertension   .  Shortness of breath   . Sleep apnea     SURGICAL HISTORY: Past Surgical History  Procedure Laterality Date  . No past surgeries      SOCIAL HISTORY: Social History   Social History  . Marital Status: Married    Spouse Name: N/A  . Number of Children: 4  . Years of Education: 8 TH   Occupational History  . Not on file.   Social History Main Topics  . Smoking status: Former Smoker -- 2.00 packs/day    Types: Cigarettes    Start date: 06/17/1968  . Smokeless tobacco: Never Used     Comment: Quit in 155s . Alcohol Use: No     Comment: former drinker for approximately 10 years  . Drug Use: No  . Sexual Activity: Not on file   Other Topics Concern  . Not on file   Social History Narrative   Patient is married with 3 children still living and 1 deceased son.   Patient is right handed.   Patient has 8 th grade education.   Patient drinks 2-3 servings daily.    FAMILY HISTORY: Family History  Problem Relation Age of Onset  . Heart disease Mother   . Cancer Brother     ALLERGIES:  has No Known Allergies.  MEDICATIONS:  Current Outpatient Prescriptions  Medication Sig Dispense Refill  . amLODipine (NORVASC) 10 MG tablet Take 1 tablet (10 mg total) by mouth daily. 90 tablet 3  . aspirin EC 81 MG tablet Take 1 tablet (81 mg total) by mouth daily. 90 tablet 3  . atorvastatin (LIPITOR) 20 MG  tablet Take 1 tablet (20 mg total) by mouth daily. 90 tablet 3  . diclofenac sodium (VOLTAREN) 1 % GEL Apply 4 g topically 4 (four) times daily. 1 Tube 1  . furosemide (LASIX) 20 MG tablet Take 1 tablet (20 mg total) by mouth daily. 5 tablet 0  . metoprolol succinate (TOPROL-XL) 25 MG 24 hr tablet TAKE 1 TABLET EVERY DAY 90 tablet 11  . predniSONE (STERAPRED UNI-PAK 21 TAB) 10 MG (21) TBPK tablet Day 1: Take 60 mg, Day 2: 40 mg, Day 3: 20 mg, Day 4: 10 mg, Day 5: 5 mg until gone 21 tablet 0  . traMADol (ULTRAM) 50 MG tablet Take 1 tablet (50 mg total) by mouth every 6 (six) hours  as needed. 15 tablet 0   No current facility-administered medications for this visit.       REVIEW OF SYSTEMS:   Constitutional: Denies fevers, chills or abnormal night sweats Eyes: Denies blurriness of vision, double vision or watery eyes Ears, nose, mouth, throat, and face: Denies mucositis or sore throat Respiratory: Denies cough, dyspnea or wheezes Cardiovascular: Denies palpitation, chest discomfort or lower extremity swelling Gastrointestinal:  Denies nausea, heartburn or change in bowel habits Skin: Denies abnormal skin rashes Lymphatics: Denies new lymphadenopathy or easy bruising Neurological:Denies numbness, tingling or new weaknesses Behavioral/Psych: Mood is stable, no new changes  All other systems were reviewed with the patient and are negative.  PHYSICAL EXAMINATION: ECOG PERFORMANCE STATUS: 1 - Symptomatic but completely ambulatory  Filed Vitals:   12/15/15 1140  BP: 138/62  Pulse: 69  Temp: 98.2 F (36.8 C)  Resp: 18   Filed Weights   12/15/15 1140  Weight: 181 lb 14.4 oz (82.509 kg)    GENERAL:alert, no distress and comfortable SKIN: skin color, texture, turgor are normal, no rashes or significant lesions EYES: normal, conjunctiva are pink and non-injected, sclera clear OROPHARYNX:no exudate, no erythema and lips, buccal mucosa, and tongue normal  NECK: supple, thyroid normal size, non-tender, without nodularity LYMPH:  no palpable lymphadenopathy in the cervical, axillary or inguinal LUNGS: clear to auscultation and percussion with normal breathing effort HEART: regular rate & rhythm and no murmurs and no lower extremity edema ABDOMEN:abdomen soft, non-tender and normal bowel sounds Musculoskeletal:no cyanosis of digits and no clubbing  PSYCH: alert & oriented x 3 with fluent speech NEURO: no focal motor/sensory deficits Breasts: Breast inspection showed them to be symmetrical with no nipple discharge. (+) Lymphedema of right breast, no significant  skin erythema, palpation of the breasts showed no mass, there is a 4X4.5cm mass at the anterior right axilla, close to right chest wall of breast margin, no tenderness, left axilla was negative.   LABORATORY DATA:  I have reviewed the data as listed CBC Latest Ref Rng 11/28/2015 09/18/2015 02/09/2014  WBC 4.0 - 10.5 K/uL 6.1 4.6 6.3  Hemoglobin 12.0 - 15.0 g/dL 9.9(L) - 13.2  Hematocrit 36.0 - 46.0 % 31.7(L) 37.2 40.9  Platelets 150 - 400 K/uL 261 273 252   CMP Latest Ref Rng 12/06/2015 11/28/2015 09/18/2015  Glucose 65 - 99 mg/dL 88 116(H) 87  BUN 8 - 27 mg/dL 33(H) 45(H) 22  Creatinine 0.57 - 1.00 mg/dL 1.30(H) 1.33(H) 1.10(H)  Sodium 134 - 144 mmol/L 146(H) 138 141  Potassium 3.5 - 5.2 mmol/L 4.8 3.8 4.9  Chloride 96 - 106 mmol/L 105 109 101  CO2 18 - 29 mmol/L 25 23 23   Calcium 8.7 - 10.3 mg/dL 9.3 9.0 9.6  Total Protein 6.0 - 8.5  g/dL 7.9 7.3 -  Total Bilirubin 0.0 - 1.2 mg/dL 0.4 0.1(L) -  Alkaline Phos 39 - 117 IU/L 59 50 -  AST 0 - 40 IU/L 9 13(L) -  ALT 0 - 32 IU/L 7 12(L) -    PATHOLOGY REPORT: Diagnosis 11/30/2015 Lymph node, needle/core biopsy, right breast/axilla - LYMPH NODE POSITIVE FOR METASTATIC POORLY DIFFERENTIATED CARCINOMA. - SEE COMMENT. Microscopic Comment Immunohistochemical stains are performed. The tumor is positive for cytokeratin AE1/AE3, cytokeratin 7 and E-cadherin. CDX-2 demonstrates weak positivity. Estrogen receptor demonstrates focal very weak to equivocal staining. Gross cystic disease fluid protein, cytokeratin 20, TTF-1, Melan-A and S100 are all negative. The findings are consistent with a poorly differentiated carcinoma. Given the staining pattern, an upper gastrointestinal and pancreatobiliary primary source should be ruled out. In addition, given the axillary location of the biopsy and focal weak to equivocal estrogen receptor staining, ruling out a poorly differentiated carcinoma of breast primary source is also prudent. Lastly, a gynecologic  primary may be considered. Correlation with clinical and radiologic impression is essential. Dr. Vicente Males has seen this case in consultation with essential agreement of the above diagnosis and comment. The findings are called to the [facility] on 12/04/15. (RAH:gt, 12/04/15)  RADIOGRAPHIC STUDIES: I have personally reviewed the radiological images as listed and agreed with the findings in the report. Dg Chest 2 View  11/28/2015  CLINICAL DATA:  Swelling in the hands and feet. EXAM: CHEST  2 VIEW COMPARISON:  12/12/2011 FINDINGS: Normal heart size and pulmonary vascularity. No focal airspace disease or consolidation in the lungs. No blunting of costophrenic angles. No pneumothorax. Mediastinal contours appear intact. Tortuous aorta. Degenerative changes in the thoracic spine. IMPRESSION: No evidence of active pulmonary disease. Electronically Signed   By: Lucienne Capers M.D.   On: 11/28/2015 01:48   Mm Digital Diagnostic Unilat R  11/30/2015  CLINICAL DATA:  Status post ultrasound-guided core biopsy of abnormal enlarged right axillary lymph node. EXAM: DIAGNOSTIC RIGHT MAMMOGRAM POST ULTRASOUND BIOPSY COMPARISON:  Previous exam(s). FINDINGS: Mammographic images were obtained following ultrasound guided biopsy of enlarged right axillary lymph node. Exaggerated right CC view and lateral view of right breast demonstrate HydroMARK biopsy clip in the enlarged lymph node concern. IMPRESSION: Post biopsy mammogram demonstrating biopsy clip in the lymph node of concern. Final Assessment: Post Procedure Mammograms for Marker Placement Electronically Signed   By: Abelardo Diesel M.D.   On: 11/30/2015 15:06   [place] Rt Breast Bx W Loc Dev 1st Lesion Img Bx Spec US Guide  12/05/2015  ADDENDUM REPORT: 12/04/2015 14:43 ADDENDUM: Pathology revealed a right axillary lymph node that is positive for poorly differentiated carcinoma. Given the staining pattern, an upper gastrointestinal and pancreatobiliary  source should be ruled out. In addition, given the axillary location of the biopsy and focal weak to equivocal estrogen receptor staining, ruling out a breast primary is prudent. Lastly, a gynelogical primary may be considered. This was found to be concordant by Dr. Abelardo Diesel. Pathology results were discussed with the patient's husband by telephone. The patient did well after the biopsy. Post biopsy instructions and care were reviewed and questions were answered. The patient was encouraged to call [facility] for any additional concerns. The patient has been referred for a medical oncology consultation. Pathology results reported by Susa Raring RN, BSN on 12/04/2015. Electronically Signed   By: Abelardo Diesel M.D.   On: 12/04/2015 14:43  12/05/2015  CLINICAL DATA:  Abnormal enlarged lymph  no right axilla/axillary tail for biopsy. EXAM: ULTRASOUND GUIDED CORE NEEDLE BIOPSY OF A RIGHT AXILLARY NODE COMPARISON:  Previous exam(s). FINDINGS: I met with the patient and we discussed the procedure of ultrasound-guided biopsy, including benefits and alternatives. We discussed the high likelihood of a successful procedure. We discussed the risks of the procedure, including infection, bleeding, tissue injury, clip migration, and inadequate sampling. Informed written consent was given. The usual time-out protocol was performed immediately prior to the procedure. Using sterile technique and 1% Lidocaine as local anesthetic, under direct ultrasound visualization, a 12 gauge spring-loaded device was used to perform biopsy of abnormal enlarged right axillary tail lymph node using a lateral approach. At the conclusion of the procedure a HydroMARK tissue marker clip was deployed into the biopsy cavity. The samples were submitted in 2 containers, 1 container is in saline and the other container is in formalin. Follow up 2 view mammogram was performed and dictated separately. IMPRESSION: Ultrasound guided  biopsy of right axillary lymph node. No apparent complications. Electronically Signed: By: Abelardo Diesel M.D. On: 11/30/2015 14:53   DIAGNOSTIC MAMMOGRAM OF RIGHT BREAST FINDINGS: CC and MLO views of the right breast were performed with tomosynthesis. The right breast demonstrates diffuse skin thickening and increased density. There is right axillary adenopathy. No focal mass is identified within the breast.  Mammographic images were processed with CAD.  On physical exam, there is palpable right axillary adenopathy including a 5 cm right axillary lymph node. The right breast and nipple are asymmetrically enlarged relative to the left.  Targeted ultrasound is performed, showing numerous enlarged right axillary lymph nodes, the largest measuring 5.3 x 4.2 x 2.6 cm.  IMPRESSION: Suspicious right axillary mass.  Ct CHEST WITH CONTRAST 09/27/2015 IMPRESSION: 1. 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 The right breast is enlarged with increased soft tissue attenuation, skin thickening and possible nipple retraction. There are bulky right axillary lymph nodes with smaller right supraclavicular lymph nodes. 2. No other evidence of metastatic disease. 3. Mild cardiomegaly. 4. No acute findings in the lungs.   ASSESSMENT & PLAN:  70 year old female, with past medical history of HTN, GERD, sleep apnea, presented with bulky right axillary adenopathy.  1. 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 Her right breast lymphedema is likely secondary to the axillary adenopathy, this is unlikely inflammatory breast cancer, I do not see think the right breast skin biopsy is helpful. -I recommend bilateral breast MRI with and without contrast  to look for primary, MRI is more sensitive than mammogram and ultrasound. -I also would like to ruled out as a primary and metastatic disease. I recommend a PET scan. She has mild renal insufficiency, may not be a candidate for CT contrast. -I'll also request PR and HER-2 to be tested on her biopsy -If PET scan is negative for distant metastasis, I recommend her to see a breast surgeon. Patient is very reluctant to consider surgery, although she understands that surgery may be the only curative option for nonmetastatic breast cancer. She will think about surgery. -If this is breast cancer without distant metastasis, given the weak ER positivity, I would recommend neoadjuvant chemotherapy. The chemotherapy regiment will be dependent on the HER-2 status.  2. ANEMIA  -She developed normocytic mild anemia in the past few months,  hemoglobin 9.9 2 weeks ago -No clinical signs of bleeding. Possible related to her underlying malignancy -I'll check iron study, folic acid and B91 on her next lab.  3. CKD stage III -Her GFR was 54 6 months ago, and currently in 40s -No lab signs of tumor lysis -Possibly related to her underlying hypertension -Avoid nephrotoxins, such as NSAIDs and IV CT contrast  4. HTN, GERD, sleep apnea -She'll continue follow-up with her primary care physician  PLAN -Bilateral breast MRI with and without contrast -PET/CT scan -I'll see her back in 2 weeks, also met after her above images studies. -We'll call pathology to request PR and HER-2 on her biopsy tissue.   All questions were answered. The patient knows to call the clinic with any problems, questions or concerns. I spent 55 minutes counseling the patient face to face. The total time spent in the appointment was 60 minutes and more than 50% was on counseling.     Truitt Merle, MD 12/15/2015 12:21 PM

## 2015-12-15 ENCOUNTER — Telehealth: Payer: Self-pay | Admitting: Hematology

## 2015-12-15 ENCOUNTER — Encounter: Payer: Self-pay | Admitting: Hematology

## 2015-12-15 ENCOUNTER — Ambulatory Visit (HOSPITAL_BASED_OUTPATIENT_CLINIC_OR_DEPARTMENT_OTHER): Payer: Commercial Managed Care - HMO | Admitting: Hematology

## 2015-12-15 VITALS — BP 138/62 | HR 69 | Temp 98.2°F | Resp 18 | Ht 64.0 in | Wt 181.9 lb

## 2015-12-15 DIAGNOSIS — N183 Chronic kidney disease, stage 3 (moderate): Secondary | ICD-10-CM | POA: Diagnosis not present

## 2015-12-15 DIAGNOSIS — D649 Anemia, unspecified: Secondary | ICD-10-CM | POA: Diagnosis not present

## 2015-12-15 DIAGNOSIS — I10 Essential (primary) hypertension: Secondary | ICD-10-CM

## 2015-12-15 DIAGNOSIS — C773 Secondary and unspecified malignant neoplasm of axilla and upper limb lymph nodes: Secondary | ICD-10-CM

## 2015-12-15 DIAGNOSIS — C801 Malignant (primary) neoplasm, unspecified: Secondary | ICD-10-CM

## 2015-12-15 DIAGNOSIS — C779 Secondary and unspecified malignant neoplasm of lymph node, unspecified: Secondary | ICD-10-CM

## 2015-12-15 NOTE — Telephone Encounter (Signed)
Gave and printed appt sched and avs for pt for July  °

## 2015-12-16 ENCOUNTER — Encounter: Payer: Self-pay | Admitting: Hematology

## 2015-12-16 DIAGNOSIS — C50911 Malignant neoplasm of unspecified site of right female breast: Secondary | ICD-10-CM | POA: Insufficient documentation

## 2015-12-18 ENCOUNTER — Telehealth: Payer: Self-pay | Admitting: Internal Medicine

## 2015-12-18 NOTE — Telephone Encounter (Signed)
i have concerns with [facility] and all the insurance pharmacies, it typically takes 7 to 10 days for pt's to receive their medications once the script is processed at the pharmacy, this script was written early last week, they are calling today to verify the script because the directions were not the normal directions for this med, spent 15 mins on phone w/ pharmacy saying yes this is how dr rivet wrote the script and would like for pt to follow these directions, then they tell me it will be 7 to 10 days before pt receives the med, i ask for a rush and then she should get it in 5 days, when all is said and done it has taken appr 20 days to receive a med that she needed asap.

## 2015-12-18 NOTE — Telephone Encounter (Signed)
Questions about patients medication.

## 2015-12-18 NOTE — Telephone Encounter (Signed)
Is this the prednisone Dr. Randell Patient was talking about?    Can we call in the Rx to a local pharmacy for her to pick up today?  Thanks!

## 2015-12-18 NOTE — Telephone Encounter (Signed)
Tried to call pt to check which pharmacy and let them know they could get this here, no answer

## 2015-12-21 ENCOUNTER — Ambulatory Visit
Admission: RE | Admit: 2015-12-21 | Discharge: 2015-12-21 | Disposition: A | Discharge status: Home / Self Care | Payer: Commercial Managed Care - HMO | Source: Ambulatory Visit | Attending: Hematology | Admitting: Hematology

## 2015-12-21 DIAGNOSIS — C801 Malignant (primary) neoplasm, unspecified: Principal | ICD-10-CM

## 2015-12-21 DIAGNOSIS — C779 Secondary and unspecified malignant neoplasm of lymph node, unspecified: Secondary | ICD-10-CM

## 2015-12-21 DIAGNOSIS — C773 Secondary and unspecified malignant neoplasm of axilla and upper limb lymph nodes: Secondary | ICD-10-CM | POA: Diagnosis not present

## 2015-12-21 MED ORDER — GADOBENATE DIMEGLUMINE 529 MG/ML IV SOLN
17.0000 mL | Freq: Once | INTRAVENOUS | Status: AC | PRN
  Administered 2015-12-21: 17 mL via INTRAVENOUS

## 2015-12-22 ENCOUNTER — Other Ambulatory Visit: Payer: Self-pay | Admitting: *Deleted

## 2015-12-22 ENCOUNTER — Other Ambulatory Visit: Payer: Self-pay | Admitting: Hematology

## 2015-12-22 DIAGNOSIS — C779 Secondary and unspecified malignant neoplasm of lymph node, unspecified: Secondary | ICD-10-CM

## 2015-12-22 DIAGNOSIS — C801 Malignant (primary) neoplasm, unspecified: Principal | ICD-10-CM

## 2015-12-23 ENCOUNTER — Other Ambulatory Visit: Payer: Self-pay | Admitting: Hematology

## 2015-12-25 ENCOUNTER — Telehealth: Payer: Self-pay | Admitting: Hematology

## 2015-12-25 ENCOUNTER — Other Ambulatory Visit: Payer: Self-pay | Admitting: *Deleted

## 2015-12-25 NOTE — Telephone Encounter (Signed)
cld & spoke to [pt and gave pt time & date for 7/21 appt @11 

## 2015-12-27 ENCOUNTER — Other Ambulatory Visit: Payer: Commercial Managed Care - HMO

## 2015-12-27 ENCOUNTER — Ambulatory Visit
Admission: RE | Admit: 2015-12-27 | Discharge: 2015-12-27 | Disposition: A | Discharge status: Home / Self Care | Payer: Commercial Managed Care - HMO | Source: Ambulatory Visit | Attending: Hematology | Admitting: Hematology

## 2015-12-27 ENCOUNTER — Ambulatory Visit: Payer: Commercial Managed Care - HMO | Admitting: Hematology

## 2015-12-27 DIAGNOSIS — C779 Secondary and unspecified malignant neoplasm of lymph node, unspecified: Secondary | ICD-10-CM

## 2015-12-27 DIAGNOSIS — N6489 Other specified disorders of breast: Secondary | ICD-10-CM | POA: Diagnosis not present

## 2015-12-27 DIAGNOSIS — C801 Malignant (primary) neoplasm, unspecified: Principal | ICD-10-CM

## 2015-12-27 DIAGNOSIS — C50111 Malignant neoplasm of central portion of right female breast: Secondary | ICD-10-CM | POA: Diagnosis not present

## 2015-12-27 MED ORDER — GADOBENATE DIMEGLUMINE 529 MG/ML IV SOLN
17.0000 mL | Freq: Once | INTRAVENOUS | Status: AC | PRN
  Administered 2015-12-27: 17 mL via INTRAVENOUS

## 2015-12-28 ENCOUNTER — Telehealth: Payer: Self-pay | Admitting: *Deleted

## 2015-12-28 NOTE — Telephone Encounter (Signed)
Received call from [facility] stating that pt had biopsy yest that showed mammary carcinoma.  She noticed that pt's next appt with Dr [name] is 01/05/16 & wonders if Dr [name] would want to see her any sooner.  Message to Dr [name].

## 2016-01-01 ENCOUNTER — Other Ambulatory Visit: Payer: Self-pay | Admitting: Internal Medicine

## 2016-01-01 NOTE — Telephone Encounter (Signed)
She is supposed to come in to be seen if swelling not improved with steroids.  Would not treat with a dose pack again unless she is seen in the clinic.  Can we get her an appointment?

## 2016-01-03 ENCOUNTER — Ambulatory Visit (HOSPITAL_COMMUNITY)
Admission: RE | Admit: 2016-01-03 | Discharge: 2016-01-03 | Disposition: A | Discharge status: Home / Self Care | Payer: Commercial Managed Care - HMO | Source: Ambulatory Visit | Attending: Hematology | Admitting: Hematology

## 2016-01-03 DIAGNOSIS — D259 Leiomyoma of uterus, unspecified: Secondary | ICD-10-CM | POA: Diagnosis not present

## 2016-01-03 DIAGNOSIS — C801 Malignant (primary) neoplasm, unspecified: Secondary | ICD-10-CM | POA: Diagnosis not present

## 2016-01-03 DIAGNOSIS — K5730 Diverticulosis of large intestine without perforation or abscess without bleeding: Secondary | ICD-10-CM | POA: Diagnosis not present

## 2016-01-03 DIAGNOSIS — I700 Atherosclerosis of aorta: Secondary | ICD-10-CM | POA: Insufficient documentation

## 2016-01-03 DIAGNOSIS — C779 Secondary and unspecified malignant neoplasm of lymph node, unspecified: Secondary | ICD-10-CM

## 2016-01-03 DIAGNOSIS — N6489 Other specified disorders of breast: Secondary | ICD-10-CM | POA: Insufficient documentation

## 2016-01-03 DIAGNOSIS — C773 Secondary and unspecified malignant neoplasm of axilla and upper limb lymph nodes: Secondary | ICD-10-CM | POA: Diagnosis not present

## 2016-01-03 LAB — GLUCOSE, CAPILLARY: Glucose-Capillary: 93 mg/dL (ref 65–99)

## 2016-01-03 MED ORDER — FLUDEOXYGLUCOSE F - 18 (FDG) INJECTION
8.9600 | Freq: Once | INTRAVENOUS | Status: AC | PRN
  Administered 2016-01-03: 8.96 via INTRAVENOUS

## 2016-01-05 ENCOUNTER — Other Ambulatory Visit (HOSPITAL_BASED_OUTPATIENT_CLINIC_OR_DEPARTMENT_OTHER): Payer: Commercial Managed Care - HMO

## 2016-01-05 ENCOUNTER — Ambulatory Visit (HOSPITAL_BASED_OUTPATIENT_CLINIC_OR_DEPARTMENT_OTHER): Payer: Commercial Managed Care - HMO | Admitting: Hematology

## 2016-01-05 ENCOUNTER — Telehealth: Payer: Self-pay | Admitting: Hematology

## 2016-01-05 ENCOUNTER — Encounter: Payer: Self-pay | Admitting: Hematology

## 2016-01-05 VITALS — BP 146/58 | HR 68 | Temp 98.4°F | Resp 17 | Ht 64.0 in | Wt 181.4 lb

## 2016-01-05 DIAGNOSIS — Z171 Estrogen receptor negative status [ER-]: Secondary | ICD-10-CM | POA: Diagnosis not present

## 2016-01-05 DIAGNOSIS — C50911 Malignant neoplasm of unspecified site of right female breast: Secondary | ICD-10-CM

## 2016-01-05 DIAGNOSIS — D649 Anemia, unspecified: Secondary | ICD-10-CM

## 2016-01-05 DIAGNOSIS — D630 Anemia in neoplastic disease: Secondary | ICD-10-CM | POA: Insufficient documentation

## 2016-01-05 DIAGNOSIS — C778 Secondary and unspecified malignant neoplasm of lymph nodes of multiple regions: Secondary | ICD-10-CM

## 2016-01-05 DIAGNOSIS — I10 Essential (primary) hypertension: Secondary | ICD-10-CM

## 2016-01-05 DIAGNOSIS — N183 Chronic kidney disease, stage 3 (moderate): Secondary | ICD-10-CM

## 2016-01-05 DIAGNOSIS — C799 Secondary malignant neoplasm of unspecified site: Secondary | ICD-10-CM | POA: Diagnosis not present

## 2016-01-05 LAB — COMPREHENSIVE METABOLIC PANEL
ALBUMIN: 3.5 g/dL (ref 3.5–5.0)
ALK PHOS: 57 U/L (ref 40–150)
ALT: 10 U/L (ref 0–55)
AST: 13 U/L (ref 5–34)
Anion Gap: 9 mEq/L (ref 3–11)
BUN: 25.3 mg/dL (ref 7.0–26.0)
CALCIUM: 9.4 mg/dL (ref 8.4–10.4)
CO2: 26 mEq/L (ref 22–29)
CREATININE: 1.2 mg/dL — AB (ref 0.6–1.1)
Chloride: 108 mEq/L (ref 98–109)
EGFR: 53 mL/min/{1.73_m2} — ABNORMAL LOW (ref >90)
GLUCOSE: 87 mg/dL (ref 70–140)
Potassium: 4.3 mEq/L (ref 3.5–5.1)
SODIUM: 143 meq/L (ref 136–145)
TOTAL PROTEIN: 8.4 g/dL — AB (ref 6.4–8.3)
Total Bilirubin: 0.63 mg/dL (ref 0.20–1.20)

## 2016-01-05 LAB — CBC WITH DIFFERENTIAL/PLATELET
BASO%: 0.2 % (ref 0.0–2.0)
BASOS ABS: 0 10*3/uL (ref 0.0–0.1)
EOS%: 2.3 % (ref 0.0–7.0)
Eosinophils Absolute: 0.1 10*3/uL (ref 0.0–0.5)
HEMATOCRIT: 31.6 % — AB (ref 34.8–46.6)
HEMOGLOBIN: 10.1 g/dL — AB (ref 11.6–15.9)
LYMPH#: 0.8 10*3/uL — AB (ref 0.9–3.3)
LYMPH%: 18.9 % (ref 14.0–49.7)
MCH: 26.1 pg (ref 25.1–34.0)
MCHC: 31.8 g/dL (ref 31.5–36.0)
MCV: 82.1 fL (ref 79.5–101.0)
MONO#: 0.6 10*3/uL (ref 0.1–0.9)
MONO%: 13.8 % (ref 0.0–14.0)
NEUT%: 64.8 % (ref 38.4–76.8)
NEUTROS ABS: 2.7 10*3/uL (ref 1.5–6.5)
Platelets: 252 10*3/uL (ref 145–400)
RBC: 3.85 10*6/uL (ref 3.70–5.45)
RDW: 14.8 % — AB (ref 11.2–14.5)
WBC: 4.2 10*3/uL (ref 3.9–10.3)

## 2016-01-05 MED ORDER — PROCHLORPERAZINE MALEATE 10 MG PO TABS
10.0000 mg | ORAL_TABLET | Freq: Four times a day (QID) | ORAL | Status: DC | PRN
Start: 2016-01-05 — End: 2016-06-11

## 2016-01-05 MED ORDER — ONDANSETRON HCL 8 MG PO TABS: 8.0000 mg | ORAL_TABLET | Freq: Two times a day (BID) | ORAL | Status: DC | PRN

## 2016-01-05 NOTE — Progress Notes (Signed)
[place]  [phone]) [phone] [phone]  Clinic Follow Up Note   Patient Care Team: [name], MD as PCP - General (Internal Medicine) 01/05/2016   CHIEF COMPLAINTS:  Follow up right breast cancer   HISTORY OF PRESENTING ILLNESS (12/14/2015) :  [name] 70 y.o. 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 She is accompanied by her husband to the clinic today.  She noticed a lump at right axilla in 08/2015, no pain or other complains, she also report right breast swelling, no pain, skin erythema, nipple discharge or other complain. She otherwise feels well. She was initially seen at urgent care in March, then was referred to establish her care with prior care physician at [facility] internal medicine center. Her screening mammogram was negative in January 2017. She subsequently underwent CT chest on 09/27/2015 which showed enlarged and inflamed right breast, bulky right axillary lymph nodes. She was referred to breast Center for diagnostic mammogram and ultrasound of right breast, which was negative. She underwent ultrasound guided right axillary node biopsy on 12/05/2015, which reviewed port differentiated carcinoma. ER weakly focally positive.  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 She works independently. She lives with her husband. She denies any fever, night sweats, chills, or skin itchiness.  CURRENT THERAPY: pending chemotherapy  INTERIM HISTORY:  [place] follow up. She tolerated right breast biopsy well, still has mild soreness. She noticed her right breast is slightly more swelling,  and her right axillary node has become slightly bigger. She denies any other new symptoms. She has good appetite and energy level, no recent weight loss. No other pain or new symptoms. He came in by herself today, states her husband is busy today and could not come with her.  MEDICAL HISTORY:  Past Medical History  Diagnosis Date  . GERD (gastroesophageal reflux disease)   . Arthritis   . Hypertension   . Shortness of breath   . Sleep apnea   . Stroke ([facility]     SURGICAL HISTORY: Past Surgical History  Procedure Laterality Date  . No past surgeries      SOCIAL HISTORY: Social History   Social History  . Marital Status: Married    Spouse Name: N/A  . Number of Children: 4  . Years of Education: 8 TH   Occupational History  . Not on file.   Social History Main Topics  . Smoking status: Former Smoker -- 0.50 packs/day for 1 years    Types: Cigarettes    Start date: 06/17/1968    Quit date: 06/17/1973  . Smokeless tobacco: Never Used     Comment: Quit in 174s . Alcohol Use: No     Comment: former drinker for approximately 10 years  . Drug Use: No  . Sexual Activity: Not on file   Other Topics Concern  . Not on file   Social History Narrative   Patient is married with 3 children still living and 1 deceased son.   Patient is right handed.   Patient has 8 th grade education.   Patient drinks  2-3 servings daily.    FAMILY HISTORY: Family History  Problem Relation Age of Onset  . Heart disease Mother   . Cancer Father 34    throat cancer  . Cancer Cousin     breast cancer    ALLERGIES:  has No Known Allergies.  MEDICATIONS:  Current Outpatient Prescriptions  Medication Sig Dispense Refill  . amLODipine (NORVASC) 10 MG tablet Take 1 tablet (10 mg total) by mouth daily. 90 tablet 3  . aspirin EC 81 MG tablet Take 1 tablet (81 mg total) by mouth daily. 90 tablet 3  . atorvastatin (LIPITOR) 20 MG tablet Take 1 tablet (20 mg total) by mouth daily. 90 tablet 3    . diclofenac sodium (VOLTAREN) 1 % GEL Apply 4 g topically 4 (four) times daily. (Patient not taking: Reported on 01/05/2016) 1 Tube 1  . predniSONE (STERAPRED UNI-PAK 21 TAB) 10 MG (21) TBPK tablet Day 1: Take 60 mg, Day 2: 40 mg, Day 3: 20 mg, Day 4: 10 mg, Day 5: 5 mg until gone (Patient not taking: Reported on 01/05/2016) 21 tablet 0  . traMADol (ULTRAM) 50 MG tablet Take 1 tablet (50 mg total) by mouth every 6 (six) hours as needed. (Patient not taking: Reported on 01/05/2016) 15 tablet 0   No current facility-administered medications for this visit.       REVIEW OF SYSTEMS:   Constitutional: Denies fevers, chills or abnormal night sweats Eyes: Denies blurriness of vision, double vision or watery eyes Ears, nose, mouth, throat, and face: Denies mucositis or sore throat Respiratory: Denies cough, dyspnea or wheezes Cardiovascular: Denies palpitation, chest discomfort or lower extremity swelling Gastrointestinal:  Denies nausea, heartburn or change in bowel habits Skin: Denies abnormal skin rashes Lymphatics: Denies new lymphadenopathy or easy bruising Neurological:Denies numbness, tingling or new weaknesses Behavioral/Psych: Mood is stable, no new changes  All other systems were reviewed with the patient and are negative.  PHYSICAL EXAMINATION: ECOG PERFORMANCE STATUS: 1 - Symptomatic but completely ambulatory  Filed Vitals:   01/05/16 1140  BP: 146/58  Pulse: 68  Temp: 98.4 F (36.9 C)  Resp: 17   Filed Weights   01/05/16 1140  Weight: 181 lb 6.4 oz (82.283 kg)    GENERAL:alert, no distress and comfortable SKIN: skin color, texture, turgor are normal, no rashes or significant lesions EYES: normal, conjunctiva are pink and non-injected, sclera clear OROPHARYNX:no exudate, no erythema and lips, buccal mucosa, and tongue normal  NECK: supple, thyroid normal size, non-tender, without nodularity LYMPH:  no palpable lymphadenopathy in the cervical, axillary or inguinal LUNGS:  clear to auscultation and percussion with normal breathing effort HEART: regular rate & rhythm and no murmurs and no lower extremity edema ABDOMEN:abdomen soft, non-tender and normal bowel sounds Musculoskeletal:no cyanosis of digits and no clubbing  PSYCH: alert & oriented x 3 with fluent speech NEURO: no focal motor/sensory deficits Breasts: Breast inspection showed them to be symmetrical with no nipple discharge. (+) Lymphedema of right breast, no significant skin erythema, palpation of the breasts showed no mass, there is a 4X4.5cm mass at the anterior right axilla, close to right chest wall of breast margin, no tenderness, left axilla was negative.   LABORATORY DATA:  I have reviewed the data as listed CBC Latest Ref Rng 01/05/2016 11/28/2015 09/18/2015  WBC 3.9 - 10.3 10e3/uL 4.2 6.1 4.6  Hemoglobin 11.6 - 15.9 g/dL 10.1(L) 9.9(L) -  Hematocrit 34.8 - 46.6 % 31.6(L) 31.7(L) 37.2  Platelets 145 - 400 10e3/uL 252 261  [place] Ref Rng 01/05/2016 12/06/2015 11/28/2015  Glucose 70 - 140 mg/dl 87 88 116(H)  BUN 7.0 - 26.0 mg/dL 25.3 33(H) 45(H)  Creatinine 0.6 - 1.1 mg/dL 1.2(H) 1.30(H) 1.33(H)  Sodium 136 - 145 mEq/L 143 146(H) 138  Potassium 3.5 - 5.1 mEq/L 4.3 4.8 3.8  Chloride 96 - 106 mmol/L - 105 109  CO2 22 - 29 mEq/L _0 Calcium 8.4 - 10.4 mg/dL 9.4 9.3 9.0  Total Protein 6.4 - 8.3 g/dL 8.4(H) 7.9 7.3  Total Bilirubin 0.20 - 1.20 mg/dL 0.63 0.4 0.1(L)  Alkaline Phos 40 - 150 U/L 57 59 50  AST 5 - 34 U/L 13 9 13(L)  ALT 0 - 55 U/L 10 7 12(L)    PATHOLOGY REPORT: Diagnosis 11/30/2015 Lymph node, needle/core biopsy, right breast/axilla - LYMPH NODE POSITIVE FOR METASTATIC POORLY DIFFERENTIATED CARCINOMA. - SEE COMMENT. Microscopic Comment Immunohistochemical stains are performed. The tumor is positive for cytokeratin AE1/AE3, cytokeratin 7 and E-cadherin. CDX-2 demonstrates weak positivity. Estrogen receptor demonstrates focal very weak to equivocal staining. Gross  cystic disease fluid protein, cytokeratin 20, TTF-1, Melan-A and S100 are all negative. The findings are consistent with a poorly differentiated carcinoma. Given the staining pattern, an upper gastrointestinal and pancreatobiliary primary source should be ruled out. In addition, given the axillary location of the biopsy and focal weak to equivocal estrogen receptor staining, ruling out a poorly differentiated carcinoma of breast primary source is also prudent. Lastly, a gynecologic primary may be considered. Correlation with clinical and radiologic impression is essential. Dr. Vicente Males has seen this case in consultation with essential agreement of the above diagnosis and comment. The findings are called to the [facility] on 12/04/15. (RAH:gt, 12/04/15)  Diagnosis 12/27/2015 Breast, right, needle core biopsy, central - INVASIVE MAMMARY CARCINOMA. - MAMMARY CARCINOMA IN SITU. - LYMPH/VASCULAR INVASION IS IDENTIFIED. - SEE COMMENT. Microscopic Comment An E-cadherin stain will be performed to determine if the carcinoma is ductal or lobular in nature. The results of the stain will be reported in an addendum to follow. Although definitive grading of breast carcinoma is best done on excision, the features of the invasive tumor from the right central breast biopsy are compatible with a grade 3 breast carcinoma. Breast prognostic markers will be performed and reported in an addendum. Findings are called to the [facility] on 12/28/2015. Dr. Lyndon Code has seen this case in consultation with agreement. (RH:kh 12-28-15) ADDENDUM: An E-cadherin immunohistochemical stain is performed which is positive in both the invasive and in situ components confirming the ductal nature of both (i.e. invasive ductal carcinoma with ductal carcinoma in situ). (RAH:gt, 12/29/15) PROGNOSTIC INDICATORS Results: IMMUNOHISTOCHEMICAL AND MORPHOMETRIC ANALYSIS PERFORMED MANUALLY Estrogen Receptor: 0%,  NEGATIVE Progesterone Receptor: 0%, NEGATIVE Proliferation Marker Ki67: 70% Results: HER2 - NEGATIVE RATIO OF HER2/CEP17 SIGNALS 1.13 AVERAGE HER2 COPY NUMBER PER CELL 1.75   RADIOGRAPHIC STUDIES: I have personally reviewed the radiological images as listed and agreed with the findings in the report. Mr Breast Bilateral W Wo Contrast  12/21/2015  CLINICAL DATA:  70 year old female with recently diagnosed poorly differentiated carcinoma within an enlarged right axillary lymph node. The patient was initially noted to have enlarged supraclavicular and right axillary adenopathy on a chest CT in April 2017. LABS:  No labs were performed at the imaging center today. EXAM: BILATERAL BREAST MRI WITH AND WITHOUT CONTRAST TECHNIQUE: Multiplanar, multisequence MR images of both breasts were obtained prior to and following the intravenous  administration of 17 ml of MultiHance. THREE-DIMENSIONAL MR IMAGE RENDERING ON INDEPENDENT WORKSTATION: Three-dimensional MR images were rendered by post-processing of the original MR data on an independent workstation. The three-dimensional MR images were interpreted, and findings are reported in the following complete MRI report for this study. Three dimensional images were evaluated at the independent Dyn[employer] workstation COMPARISON:  Previous exam(s). FINDINGS: Breast composition: c. Heterogeneous fibroglandular tissue. Background parenchymal enhancement: Mild Right breast: There is extensive non mass enhancement involving the near entirety of the right breast measuring 10.6 x 6.2 x 9.2 cm. There is associated skin thickening and nipple retraction. Left breast: No suspicious mass or enhancement Lymph nodes: Extensive right axillary and supraclavicular adenopathy. Biopsy marker artifact is seen within the largest right axillary lymph node (series 3, image 66). Abnormal, enlarged 8 mm right internal mammary lymph node (series 6, image 124). Indeterminate 6 mm right internal mammary  lymph node (series 6, image 169). Multiple enlarged left axillary lymph nodes, new from chest CT in April 2017. IMPRESSION: 1. Extensive non mass enhancement involving the near entirety of the right breast, worrisome for primary breast carcinoma. 2. Right axillary, supraclavicular, and internal mammary adenopathy. 3. There is new left axillary adenopathy when compared to prior chest CT. RECOMMENDATION: MRI guided right breast biopsy. BI-RADS CATEGORY  5: Highly suggestive of malignancy. Electronically Signed   By: Pamelia Hoit M.D.   On: 12/21/2015 14:57   Nm Pet Image Initial (pi) Skull Base To Thigh  01/03/2016  CLINICAL DATA:  Initial treatment strategy for metastatic right axillary lymphadenopathy. Unknown primary. EXAM: NUCLEAR MEDICINE PET SKULL BASE TO THIGH TECHNIQUE: 9.0 mCi F-18 FDG was injected intravenously. Full-ring PET imaging was performed from the skull base to thigh after the radiotracer. CT data was obtained and used for attenuation correction and anatomic localization. FASTING BLOOD GLUCOSE:  Value: 93 mg/dl COMPARISON:  Neck and chest CT on 09/27/2015 FINDINGS: NECK 10 mm right level 5 posterior triangle lymph node is seen on image 32/4, which is hypermetabolic with SUV max of 09.8. 15 mm right supraclavicular lymph node on image 11/9 hypermetabolic with SUV max of 8.5. No hypermetabolic lymph nodes seen in the left neck. CHEST Diffuse hypermetabolic activity is seen throughout the right breast corresponding to diffuse asymmetric soft tissue density and skin thickening, consistent with inflammatory breast carcinoma. Bulky hypermetabolic lymphadenopathy is seen in the right axilla, with index lymph node measuring 3.4 cm on image 57/ 4. Right axillary lymphadenopathy has SUV max of 26.1. Mild hypermetabolic lymphadenopathy is also seen in the left axilla, with SUV max of 17.2. Mild hypermetabolic lymphadenopathy is also seen in the subpectoral regions bilaterally. Mild hypermetabolic  lymphadenopathy noted in the right internal mammary chain, with SUV max of 6.1 on image 68/3. No other sites of hypermetabolic lymphadenopathy within the mediastinum or hilar regions. No suspicious pulmonary nodules identified on CT images. ABDOMEN/PELVIS No abnormal hypermetabolic activity within the liver, pancreas, adrenal glands, or spleen. No hypermetabolic lymph nodes in the abdomen or pelvis. Tiny calcified uterine fibroids noted. Aortic atherosclerosis noted. Colonic diverticulosis noted, without evidence of diverticulitis. SKELETON No focal hypermetabolic activity to suggest skeletal metastasis. IMPRESSION: Diffuse asymmetric right breast hypermetabolic soft tissue density and skin thickening, consistent with inflammatory breast carcinoma. Hypermetabolic metastatic lymphadenopathy in bilateral axillary and subpectoral regions, right internal mammary chain, right supraclavicular region, and right neck posterior triangle, level 5. No evidence metastatic disease within the abdomen or pelvis. Incidentally noted aortic atherosclerosis, tiny calcified uterine fibroids, and colonic diverticulosis. Electronically Signed  By: Earle Gell M.D.   On: 01/03/2016 11:31   Mm Digital Diagnostic Unilat R  12/27/2015  CLINICAL DATA:  Status post MRI guided biopsy of a larger area of non mass enhancement in the right breast. Patient with known poorly differentiated carcinoma within right axillary lymph nodes. EXAM: DIAGNOSTIC RIGHT MAMMOGRAM POST MRI BIOPSY COMPARISON:  Previous exam(s). FINDINGS: Mammographic images were obtained following MRI guided biopsy of the larger area of non mass enhancement in the right breast. Dumbbell shaped biopsy clip lies within the lateral central aspect of the right breast, within the MRI area of non mass enhancement. IMPRESSION: Well-positioned dumbbell shaped biopsy clip following MRI guided biopsy of the right breast. Final Assessment: Post Procedure Mammograms for Marker Placement  Electronically Signed   By: Lajean Manes M.D.   On: 12/27/2015 09:11   Mr Rt Breast Bx Johnella Moloney Dev 1st Lesion Image Bx Spec Mr Guide  12/28/2015  ADDENDUM REPORT: 12/28/2015 14:11 ADDENDUM: Pathology revealed GRADE III INVASIVE MAMMARY CARCINOMA, MAMMARY CARCINOMA IN SITU, LYMPH/VASCULAR INVASION IS IDENTIFIED of the central Right breast. This was found to be concordant by Dr. Lajean Manes. Pathology results were discussed with the patient by telephone. The patient reported doing well after the biopsy with tenderness at the site. Post biopsy instructions and care were reviewed and questions were answered. The patient was encouraged to call [facility] for any additional concerns. The patient has a recent diagnosis of RIGHT AXILLARY LYMPH NODE POSITIVE FOR METASTATIC POORLY DIFFERENTIATED CARCINOMA and should follow her outlined treatment plan. The patient has an appointment with Dr. Truitt Merle at [facility] on January 03, 2016. Pathology results reported by Terie Purser, RN on 12/28/2015. Electronically Signed   By: Lajean Manes M.D.   On: 12/28/2015 14:11  12/28/2015  CLINICAL DATA:  Patient presents for MRI guided biopsy of a larger area of non mass enhancement within the right breast. Patient has enlarged abnormal right axillary lymph node biopsied, which revealed poorly differentiated carcinoma. EXAM: MRI GUIDED CORE NEEDLE BIOPSY OF THE RIGHT BREAST TECHNIQUE: Multiplanar, multisequence MR imaging of the right breast was performed both before and after administration of intravenous contrast. CONTRAST:  50m MULTIHANCE GADOBENATE DIMEGLUMINE 529 MG/ML IV SOLN COMPARISON:  Previous exams. FINDINGS: I met with the patient, and we discussed the procedure of MRI guided biopsy, including risks, benefits, and alternatives. Specifically, we discussed the risks of infection, bleeding, tissue injury, clip migration, and inadequate sampling. Informed, written consent was  given. The usual time out protocol was performed immediately prior to the procedure. Using sterile technique, 1% Lidocaine, MRI guidance, and a 9 gauge vacuum assisted device, biopsy was performed of the central area of non mass enhancement using a lateral approach. At the conclusion of the procedure, a dumbbell shaped tissue marker clip was deployed into the biopsy cavity. Follow-up 2-view mammogram was performed and dictated separately. IMPRESSION: MRI guided biopsy of the right breast. No apparent complications. Electronically Signed: By: DLajean ManesM.D. On: 12/27/2015 09:10   DIAGNOSTIC MAMMOGRAM OF RIGHT BREAST 12/27/2015 FINDINGS: CC and MLO views of the right breast were performed with tomosynthesis. The right breast demonstrates diffuse skin thickening and increased density. There is right axillary adenopathy. No focal mass is identified within the breast.  Mammographic images were processed with CAD.  On physical exam, there is palpable right axillary adenopathy including a 5 cm right axillary lymph node. The right breast and nipple are asymmetrically enlarged relative to  the left.  Targeted ultrasound is performed, showing numerous enlarged right axillary lymph nodes, the largest measuring 5.3 x 4.2 x 2.6 cm.  IMPRESSION: Suspicious right axillary mass.  Ct CHEST WITH CONTRAST 09/27/2015 IMPRESSION: 1. 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 The right breast is enlarged with increased soft tissue attenuation, skin thickening and possible nipple retraction. There are bulky right axillary lymph nodes with smaller right supraclavicular lymph nodes. 2. No other evidence of metastatic disease. 3. Mild cardiomegaly. 4. No acute findings in the lungs.   ASSESSMENT & PLAN:  70 year old female, with past medical history of HTN, GERD, sleep apnea, presented with bulky right axillary adenopathy.  1. 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 -Her breast MRI findings and breast biopsy confirmed primary breast cancer, triple negative. Unfortunately her cancer has metastasized to left axilla and cervical lymph nodes, which are distant metastasis. Giving the positive PET scan findings, I do not feel the need to do additional lymph node biopsy to confirm the metastasis. -We reviewed the natural history of metastatic triple negative breast cancer, which is very aggressive, and her cancer is unlikely curable at this stage  -Giving the metastatic disease, surgery up front is not indicated, I recommend systemic chemotherapy. -She has preserved performance status and organ functions, would be a candidate for chemotherapy. Given her advanced age and medical comorbidities, I recommended single agent Taxol weekly. -I recommend genetic testing, if she has positive BRCA mutation, I'll add on carboplatin, and consider PAPAR inhibitor in the future -If she has near complete response to chemotherapy, we may consider right breast mastectomy and ALND --Chemotherapy consent: Side effects including but does not not limited to, fatigue, nausea, vomiting, diarrhea, hair loss, neuropathy, fluid retention, renal and kidney dysfunction, neutropenic fever, needed for blood transfusion, bleeding, were discussed with patient in great detail. She agrees to proceed. -She will also discussed with her family members. I encouraged her to come in for chemotherapy class with her husband or sister. Burnis Medin tentatively start her chemotherapy next week.  2. ANEMIA  -She developed normocytic mild anemia in the past few months, hemoglobin 9.9 2 weeks ago -No clinical  signs of bleeding. Possible related to her underlying malignancy -I'll check iron study, folic acid and N40 on her next lab.  3. CKD stage III -Her GFR is in 40-50's  -No lab signs of tumor lysis -Possibly related to her underlying hypertension -Avoid nephrotoxins, such as NSAIDs and IV CT contrast  4. HTN, GERD, sleep apnea -She'll continue follow-up with her primary care physician  PLAN -chemo class -start weekly Taxol next week -I will see her back in 2-3 weeks    All questions were answered. The patient knows to call the clinic with any problems, questions or concerns. I spent 35 minutes counseling the patient face to face. The total time spent in the appointment was 40 minute s and more than 50% was on counseling.     Truitt Merle, MD 01/05/2016 5:10 PM

## 2016-01-05 NOTE — Telephone Encounter (Signed)
per pof to sch pt appt-gave pt copy of avs/cal °

## 2016-01-06 LAB — CANCER ANTIGEN 27.29: CAN 27.29: 155.9 U/mL — AB (ref 0.0–38.6)

## 2016-01-09 ENCOUNTER — Encounter: Payer: Self-pay | Admitting: *Deleted

## 2016-01-09 ENCOUNTER — Other Ambulatory Visit: Payer: Commercial Managed Care - HMO

## 2016-01-10 ENCOUNTER — Other Ambulatory Visit: Payer: Self-pay | Admitting: Hematology

## 2016-01-10 ENCOUNTER — Other Ambulatory Visit (HOSPITAL_BASED_OUTPATIENT_CLINIC_OR_DEPARTMENT_OTHER): Payer: Commercial Managed Care - HMO

## 2016-01-10 ENCOUNTER — Encounter: Payer: Self-pay | Admitting: Genetic Counselor

## 2016-01-10 ENCOUNTER — Ambulatory Visit (HOSPITAL_BASED_OUTPATIENT_CLINIC_OR_DEPARTMENT_OTHER): Payer: Commercial Managed Care - HMO

## 2016-01-10 VITALS — BP 153/78 | HR 70 | Temp 98.5°F | Resp 16

## 2016-01-10 DIAGNOSIS — Z5111 Encounter for antineoplastic chemotherapy: Secondary | ICD-10-CM

## 2016-01-10 DIAGNOSIS — D630 Anemia in neoplastic disease: Secondary | ICD-10-CM | POA: Diagnosis not present

## 2016-01-10 DIAGNOSIS — C778 Secondary and unspecified malignant neoplasm of lymph nodes of multiple regions: Secondary | ICD-10-CM | POA: Diagnosis not present

## 2016-01-10 DIAGNOSIS — D649 Anemia, unspecified: Secondary | ICD-10-CM | POA: Diagnosis not present

## 2016-01-10 DIAGNOSIS — C50911 Malignant neoplasm of unspecified site of right female breast: Secondary | ICD-10-CM

## 2016-01-10 DIAGNOSIS — N183 Chronic kidney disease, stage 3 (moderate): Secondary | ICD-10-CM

## 2016-01-10 LAB — CBC WITH DIFFERENTIAL/PLATELET
BASO%: 0.8 % (ref 0.0–2.0)
Basophils Absolute: 0 10*3/uL (ref 0.0–0.1)
EOS%: 2 % (ref 0.0–7.0)
Eosinophils Absolute: 0.1 10*3/uL (ref 0.0–0.5)
HCT: 31.7 % — ABNORMAL LOW (ref 34.8–46.6)
HGB: 10.2 g/dL — ABNORMAL LOW (ref 11.6–15.9)
LYMPH%: 20.2 % (ref 14.0–49.7)
MCH: 26.7 pg (ref 25.1–34.0)
MCHC: 32.2 g/dL (ref 31.5–36.0)
MCV: 83 fL (ref 79.5–101.0)
MONO#: 0.4 10*3/uL (ref 0.1–0.9)
MONO%: 10.7 % (ref 0.0–14.0)
NEUT%: 66.3 % (ref 38.4–76.8)
NEUTROS ABS: 2.6 10*3/uL (ref 1.5–6.5)
PLATELETS: 267 10*3/uL (ref 145–400)
RBC: 3.82 10*6/uL (ref 3.70–5.45)
RDW: 14.1 % (ref 11.2–14.5)
WBC: 3.9 10*3/uL (ref 3.9–10.3)
lymph#: 0.8 10*3/uL — ABNORMAL LOW (ref 0.9–3.3)

## 2016-01-10 LAB — IRON AND TIBC
%SAT: 15 % — AB (ref 21–57)
IRON: 33 ug/dL — AB (ref 41–142)
TIBC: 230 ug/dL — AB (ref 236–444)
UIBC: 197 ug/dL (ref 120–384)

## 2016-01-10 LAB — FERRITIN: Ferritin: 736 ng/ml — ABNORMAL HIGH (ref 9–269)

## 2016-01-10 MED ORDER — DIPHENHYDRAMINE HCL 50 MG/ML IJ SOLN
25.0000 mg | Freq: Once | INTRAMUSCULAR | Status: AC
  Administered 2016-01-10: 25 mg via INTRAVENOUS

## 2016-01-10 MED ORDER — SODIUM CHLORIDE 0.9 % IV SOLN
80.0000 mg/m2 | Freq: Once | INTRAVENOUS | Status: AC
  Administered 2016-01-10: 156 mg via INTRAVENOUS
  Filled 2016-01-10: qty 26

## 2016-01-10 MED ORDER — FAMOTIDINE IN NACL 20-0.9 MG/50ML-% IV SOLN
20.0000 mg | Freq: Once | INTRAVENOUS | Status: AC
  Administered 2016-01-10: 20 mg via INTRAVENOUS

## 2016-01-10 MED ORDER — SODIUM CHLORIDE 0.9 % IV SOLN
Freq: Once | INTRAVENOUS | Status: AC
  Administered 2016-01-10: 15:00:00 via INTRAVENOUS

## 2016-01-10 MED ORDER — DEXAMETHASONE SODIUM PHOSPHATE 100 MG/10ML IJ SOLN
10.0000 mg | Freq: Once | INTRAMUSCULAR | Status: AC
  Administered 2016-01-10: 10 mg via INTRAVENOUS
  Filled 2016-01-10: qty 1

## 2016-01-10 MED ORDER — MAGNESIUM HYDROXIDE 400 MG/5ML PO SUSP
30.0000 mL | Freq: Once | ORAL | Status: DC
  Filled 2016-01-10: qty 30

## 2016-01-10 MED ORDER — ALUM & MAG HYDROXIDE-SIMETH 200-200-20 MG/5ML PO SUSP
30.0000 mL | Freq: Once | ORAL | Status: AC
  Administered 2016-01-10: 30 mL via ORAL
  Filled 2016-01-10: qty 30

## 2016-01-10 MED ORDER — FAMOTIDINE IN NACL 20-0.9 MG/50ML-% IV SOLN
INTRAVENOUS | Status: AC
  Filled 2016-01-10: qty 50

## 2016-01-10 MED ORDER — MAGNESIUM HYDROXIDE 400 MG/5ML PO SUSP: 30.0000 mL | Freq: Once | ORAL | 0 refills | Status: DC

## 2016-01-10 MED ORDER — DIPHENHYDRAMINE HCL 50 MG/ML IJ SOLN
INTRAMUSCULAR | Status: AC
  Filled 2016-01-10: qty 1

## 2016-01-10 NOTE — Progress Notes (Signed)
1550: With increase of Taxol rate to 164ml/hr pt's BP 182/81. Pt reports having forgotten to take her Norvasc this AM.  Dr. Irene Limbo consulted, to continue with Taxol infusion and give 25 Hydralazine PO for continued HTN.  1609: Before HTN medication, BP 165/69. No HTN medication given. Pt then reports that she "can feel the medication" in her right breast. Reports that it feels like tingling/heartburn. Taxol paused. Dr. Irene Limbo consulted, okay to continue infusion. 67ml of magnesium hydroxide given.  1633: Taxol rate increased to 237ml/hr. BP-154/73. Pt tolerating infusion well. Reports heartburn/tingling feeling in right breast stopped. Will continue to monitor. [id number]

## 2016-01-10 NOTE — Patient Instructions (Signed)
[facility] Discharge Instructions for Patients Receiving Chemotherapy  Today you received the following chemotherapy agents Taxol   To help prevent nausea and vomiting after your treatment, we encourage you to take your nausea medication as directed.   If you develop nausea and vomiting that is not controlled by your nausea medication, call the clinic.   BELOW ARE SYMPTOMS THAT SHOULD BE REPORTED IMMEDIATELY:  *FEVER GREATER THAN 100.5 F  *CHILLS WITH OR WITHOUT FEVER  NAUSEA AND VOMITING THAT IS NOT CONTROLLED WITH YOUR NAUSEA MEDICATION  *UNUSUAL SHORTNESS OF BREATH  *UNUSUAL BRUISING OR BLEEDING  TENDERNESS IN MOUTH AND THROAT WITH OR WITHOUT PRESENCE OF ULCERS  *URINARY PROBLEMS  *BOWEL PROBLEMS  UNUSUAL RASH Items with * indicate a potential emergency and should be followed up as soon as possible.  Feel free to call the clinic you have any questions or concerns. The clinic phone number is [phone].  Please show the CHEMO ALERT CARD at check-in to the Emergency Department and triage nurse.   

## 2016-01-11 ENCOUNTER — Telehealth: Payer: Self-pay

## 2016-01-11 LAB — FOLATE RBC
FOLATE, RBC: 877 ng/mL (ref >498)
Folate, Hemolysate: 275.3 ng/mL
HEMATOCRIT: 31.4 % — AB (ref 34.0–46.6)

## 2016-01-11 LAB — VITAMIN B12: VITAMIN B 12: 724 pg/mL (ref 211–946)

## 2016-01-11 NOTE — Telephone Encounter (Signed)
-----   Message from Herschell Dimes, RN sent at 01/10/2016  5:05 PM EDT ----- Regarding: [name] - first time Taxol follow up Contact: [phone] Dr. Ernestina Penna pt [name] tolerated her first time Taxol well today - had some heartburn issues but she believes it was what she ate just before the infusion. That was relieved by [place]. [phone].

## 2016-01-11 NOTE — Telephone Encounter (Signed)
Called [name] for chemotherapy F/U.  Patient is doing well.  Denies n/v.  Denies any new side effects or symptoms.  Bowel and bladder is functioning well.  Eating and drinking well and I instructed to drink 64 oz minimum daily or at least the day before, of and after treatment.  Denies questions at this time and encouraged to call if needed.  Reviewed how to call after hours in the case of an emergency.

## 2016-01-19 ENCOUNTER — Ambulatory Visit (HOSPITAL_BASED_OUTPATIENT_CLINIC_OR_DEPARTMENT_OTHER): Payer: Commercial Managed Care - HMO | Admitting: Nurse Practitioner

## 2016-01-19 ENCOUNTER — Other Ambulatory Visit (HOSPITAL_BASED_OUTPATIENT_CLINIC_OR_DEPARTMENT_OTHER): Payer: Commercial Managed Care - HMO

## 2016-01-19 VITALS — BP 145/83 | HR 74 | Temp 98.2°F | Resp 18 | Ht 64.0 in | Wt 183.6 lb

## 2016-01-19 DIAGNOSIS — C50911 Malignant neoplasm of unspecified site of right female breast: Secondary | ICD-10-CM

## 2016-01-19 DIAGNOSIS — D649 Anemia, unspecified: Secondary | ICD-10-CM

## 2016-01-19 DIAGNOSIS — Z171 Estrogen receptor negative status [ER-]: Secondary | ICD-10-CM

## 2016-01-19 DIAGNOSIS — C799 Secondary malignant neoplasm of unspecified site: Secondary | ICD-10-CM | POA: Diagnosis not present

## 2016-01-19 DIAGNOSIS — N183 Chronic kidney disease, stage 3 (moderate): Secondary | ICD-10-CM

## 2016-01-19 DIAGNOSIS — I10 Essential (primary) hypertension: Secondary | ICD-10-CM

## 2016-01-19 DIAGNOSIS — D630 Anemia in neoplastic disease: Secondary | ICD-10-CM

## 2016-01-19 LAB — CBC WITH DIFFERENTIAL/PLATELET
BASO%: 1.4 % (ref 0.0–2.0)
BASOS ABS: 0.1 10*3/uL (ref 0.0–0.1)
EOS%: 2.3 % (ref 0.0–7.0)
Eosinophils Absolute: 0.1 10*3/uL (ref 0.0–0.5)
HEMATOCRIT: 30.4 % — AB (ref 34.8–46.6)
HGB: 9.6 g/dL — ABNORMAL LOW (ref 11.6–15.9)
LYMPH%: 16.2 % (ref 14.0–49.7)
MCH: 26.2 pg (ref 25.1–34.0)
MCHC: 31.7 g/dL (ref 31.5–36.0)
MCV: 82.7 fL (ref 79.5–101.0)
MONO#: 0.7 10*3/uL (ref 0.1–0.9)
MONO%: 13.2 % (ref 0.0–14.0)
NEUT#: 3.4 10*3/uL (ref 1.5–6.5)
NEUT%: 66.9 % (ref 38.4–76.8)
PLATELETS: 284 10*3/uL (ref 145–400)
RBC: 3.67 10*6/uL — AB (ref 3.70–5.45)
RDW: 15.4 % — ABNORMAL HIGH (ref 11.2–14.5)
WBC: 5.1 10*3/uL (ref 3.9–10.3)
lymph#: 0.8 10*3/uL — ABNORMAL LOW (ref 0.9–3.3)

## 2016-01-19 LAB — COMPREHENSIVE METABOLIC PANEL
ALBUMIN: 3.4 g/dL — AB (ref 3.5–5.0)
ALK PHOS: 54 U/L (ref 40–150)
ALT: 12 U/L (ref 0–55)
ANION GAP: 7 meq/L (ref 3–11)
AST: 13 U/L (ref 5–34)
BILIRUBIN TOTAL: 0.41 mg/dL (ref 0.20–1.20)
BUN: 23.7 mg/dL (ref 7.0–26.0)
CO2: 26 meq/L (ref 22–29)
Calcium: 9.3 mg/dL (ref 8.4–10.4)
Chloride: 108 mEq/L (ref 98–109)
Creatinine: 1.1 mg/dL (ref 0.6–1.1)
EGFR: 58 mL/min/{1.73_m2} — AB (ref >90)
Glucose: 94 mg/dl (ref 70–140)
POTASSIUM: 4.4 meq/L (ref 3.5–5.1)
Sodium: 141 mEq/L (ref 136–145)
TOTAL PROTEIN: 8.1 g/dL (ref 6.4–8.3)

## 2016-01-19 NOTE — Progress Notes (Signed)
  [facility] OFFICE PROGRESS NOTE   Diagnosis:  Right breast cancer  INTERVAL HISTORY:   Ms. Brandy returns as scheduled. She completed cycle 1 weekly Taxol 01/10/2016. She had some mild nausea. No vomiting. No mouth sores. She had a single loose stool. No rash. No numbness or tingling in her hands or feet. No pain. She has a good appetite. She is sleeping well.  Objective:  Vital signs in last 24 hours:  Blood pressure (!) 145/83, pulse 74, temperature 98.2 F (36.8 C), temperature source Oral, resp. rate 18, height 5' 4" (1.626 m), weight 183 lb 9.6 oz (83.3 kg), SpO2 100 %.    HEENT: No thrush or ulcers. Lymphatics: Approximate 5 cm right axillary lymph node. One to 2 cm left axillary lymph node. Resp: Lungs clear bilaterally. Cardio: Regular rate and rhythm. GI: Abdomen soft and nontender. No hepatomegaly. Vascular: No leg edema.   Lab Results:  Lab Results  Component Value Date   WBC 5.1 01/19/2016   HGB 9.6 (L) 01/19/2016   HCT 30.4 (L) 01/19/2016   MCV 82.7 01/19/2016   PLT 284 01/19/2016   NEUTROABS 3.4 01/19/2016    Imaging:  No results found.  Medications: I have reviewed the patient's current medications.  Assessment/Plan: 1. 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 2. Anemia possibly related to cancer. 3. CKD stage III 4. Hypertension, GERD, sleep apnea   Disposition: [name] appears stable. She has completed 1 cycle of weekly Taxol. She was not scheduled for chemotherapy today. I will discuss the treatment plan with Dr. [name] upon her return to the office. We will contact [name] early next week with an updated schedule.    Ned Card ANP/GNP-BC   01/19/2016  2:27 PM

## 2016-01-22 ENCOUNTER — Telehealth: Payer: Self-pay | Admitting: Hematology

## 2016-01-22 ENCOUNTER — Telehealth: Payer: Self-pay | Admitting: *Deleted

## 2016-01-22 ENCOUNTER — Telehealth: Payer: Self-pay

## 2016-01-22 NOTE — Telephone Encounter (Signed)
Called to inform patient Brandy Card, Np spoke with Dr. [name] and pt is to continue weekly taxol treatment. No answer, voicemail not set-up  POF sent for lab and taxol treatment for Wednesday.

## 2016-01-22 NOTE — Telephone Encounter (Signed)
Per staff message and POF I have scheduled appts. Advised scheduler of appts. JMW  

## 2016-01-22 NOTE — Telephone Encounter (Signed)
-----   Message from Owens Shark, NP sent at 01/22/2016  9:05 AM EDT ----- Please let her know I spoke with Dr. [name]. The plan is to continue weekly Taxol chemotherapy. She will need an appointment this week for lab and chemotherapy.

## 2016-01-22 NOTE — Telephone Encounter (Signed)
spoke w/ pt confirmed 8/9 apt times

## 2016-01-24 ENCOUNTER — Other Ambulatory Visit: Payer: Commercial Managed Care - HMO

## 2016-01-24 ENCOUNTER — Ambulatory Visit: Payer: Commercial Managed Care - HMO

## 2016-01-26 ENCOUNTER — Encounter: Payer: Self-pay | Admitting: Hematology

## 2016-01-26 NOTE — Progress Notes (Signed)
Called patient to introduce myself as Estate manager/land agent and to see if she has any financial questions or concerns. Patient wanted to know how long she would be taking chemo. I advised her that this question would have to be answered by the clinical staff which would be her RN or doctor. Patient verbalized understanding. Patient states her daughter handles her personal bills since she had a stroke and doesn't think that her and husband would qualify for assistance. Asked patient if I could meet her on 8/16 to give her my card and she said yes and she would have her daughter come as well. Patient thanked me for the call.

## 2016-01-31 ENCOUNTER — Ambulatory Visit: Payer: Commercial Managed Care - HMO

## 2016-01-31 ENCOUNTER — Ambulatory Visit (HOSPITAL_BASED_OUTPATIENT_CLINIC_OR_DEPARTMENT_OTHER): Payer: Commercial Managed Care - HMO | Admitting: Hematology

## 2016-01-31 ENCOUNTER — Other Ambulatory Visit: Payer: Self-pay | Admitting: Hematology

## 2016-01-31 ENCOUNTER — Encounter: Payer: Self-pay | Admitting: Hematology

## 2016-01-31 ENCOUNTER — Other Ambulatory Visit (HOSPITAL_BASED_OUTPATIENT_CLINIC_OR_DEPARTMENT_OTHER): Payer: Commercial Managed Care - HMO

## 2016-01-31 VITALS — BP 141/59 | HR 71 | Temp 98.5°F | Resp 18 | Ht 64.0 in | Wt 180.1 lb

## 2016-01-31 DIAGNOSIS — C50911 Malignant neoplasm of unspecified site of right female breast: Secondary | ICD-10-CM

## 2016-01-31 DIAGNOSIS — N63 Unspecified lump in unspecified breast: Secondary | ICD-10-CM

## 2016-01-31 DIAGNOSIS — D649 Anemia, unspecified: Secondary | ICD-10-CM

## 2016-01-31 DIAGNOSIS — C773 Secondary and unspecified malignant neoplasm of axilla and upper limb lymph nodes: Secondary | ICD-10-CM

## 2016-01-31 DIAGNOSIS — N183 Chronic kidney disease, stage 3 (moderate): Secondary | ICD-10-CM

## 2016-01-31 DIAGNOSIS — I10 Essential (primary) hypertension: Secondary | ICD-10-CM

## 2016-01-31 DIAGNOSIS — Z171 Estrogen receptor negative status [ER-]: Secondary | ICD-10-CM

## 2016-01-31 LAB — COMPREHENSIVE METABOLIC PANEL
ALBUMIN: 3.3 g/dL — AB (ref 3.5–5.0)
ALT: 9 U/L (ref 0–55)
AST: 12 U/L (ref 5–34)
Alkaline Phosphatase: 51 U/L (ref 40–150)
Anion Gap: 8 mEq/L (ref 3–11)
BUN: 29.5 mg/dL — AB (ref 7.0–26.0)
CO2: 25 meq/L (ref 22–29)
CREATININE: 1.4 mg/dL — AB (ref 0.6–1.1)
Calcium: 9.5 mg/dL (ref 8.4–10.4)
Chloride: 108 mEq/L (ref 98–109)
EGFR: 46 mL/min/{1.73_m2} — AB (ref >90)
GLUCOSE: 100 mg/dL (ref 70–140)
Potassium: 4 mEq/L (ref 3.5–5.1)
SODIUM: 141 meq/L (ref 136–145)
TOTAL PROTEIN: 8.8 g/dL — AB (ref 6.4–8.3)
Total Bilirubin: 0.42 mg/dL (ref 0.20–1.20)

## 2016-01-31 LAB — CBC WITH DIFFERENTIAL/PLATELET
BASO%: 0.7 % (ref 0.0–2.0)
Basophils Absolute: 0 10*3/uL (ref 0.0–0.1)
EOS ABS: 0.1 10*3/uL (ref 0.0–0.5)
EOS%: 1.6 % (ref 0.0–7.0)
HCT: 30.4 % — ABNORMAL LOW (ref 34.8–46.6)
HEMOGLOBIN: 9.6 g/dL — AB (ref 11.6–15.9)
LYMPH%: 15.7 % (ref 14.0–49.7)
MCH: 25.8 pg (ref 25.1–34.0)
MCHC: 31.6 g/dL (ref 31.5–36.0)
MCV: 81.7 fL (ref 79.5–101.0)
MONO#: 0.7 10*3/uL (ref 0.1–0.9)
MONO%: 12.1 % (ref 0.0–14.0)
NEUT%: 69.9 % (ref 38.4–76.8)
NEUTROS ABS: 4 10*3/uL (ref 1.5–6.5)
Platelets: 306 10*3/uL (ref 145–400)
RBC: 3.72 10*6/uL (ref 3.70–5.45)
RDW: 14.8 % — AB (ref 11.2–14.5)
WBC: 5.7 10*3/uL (ref 3.9–10.3)
lymph#: 0.9 10*3/uL (ref 0.9–3.3)

## 2016-01-31 MED ORDER — TRAMADOL HCL 50 MG PO TABS: 50.0000 mg | ORAL_TABLET | Freq: Three times a day (TID) | ORAL | 0 refills | Status: DC | PRN

## 2016-01-31 NOTE — Progress Notes (Signed)
[facility]  Telephone:([phone] [phone]  Clinic Follow Up Note   Patient Care Team: [name], MD as PCP - General (Internal Medicine) 01/31/2016   CHIEF COMPLAINTS:  Follow up right breast cancer   HISTORY OF PRESENTING ILLNESS (12/14/2015) :  [name] 70 y.o. 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 She is accompanied by her husband to the clinic today.  She noticed a lump at right axilla in 08/2015, no pain or other complains, she also report right breast swelling, no pain, skin erythema, nipple discharge or other complain. She otherwise feels well. She was initially seen at urgent care in March, then was referred to establish her care with prior care physician at [facility] internal medicine center. Her screening mammogram was negative in January 2017. She subsequently underwent CT chest on 09/27/2015 which showed enlarged and inflamed right breast, bulky right axillary lymph nodes. She was referred to breast Center for diagnostic mammogram and ultrasound of right breast, which was negative. She underwent ultrasound guided right axillary node biopsy on 12/05/2015, which reviewed port differentiated carcinoma. ER weakly focally positive.  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 She works independently. She lives with her husband. She denies any fever, night sweats, chills, or skin itchiness.  CURRENT THERAPY: Adjuvant chemotherapy with weekly Taxol, started on 01/10/2016, second cycle was delayed due to pt's non-compliance   INTERIM HISTORY:  LVanessiareturns for follow up. She started chemotherapy 3  weeks ago, tolerated her first dose paclitaxel well. She had mild fatigue, but her appetite increased after chemotherapy, likely secondary to steroids. She didn't come in the second week, but chemotherapy was not scheduled, and she did not want chemotherapy. I have rescheduled her appointment and treatment a few times, and she has not started his second cycle yet. She states she has been quite fatigued lately, has bilateral feet and ankle swollen, difficult to walk due to the swelling. She also has chronic back pain, which has been slightly worse lately. No other new symptoms. She came into the clinic with her daughter and husband today.  MEDICAL HISTORY:  Past Medical History:  Diagnosis Date  . Arthritis   . GERD (gastroesophageal reflux disease)   . Hypertension   . Shortness of breath   . Sleep apnea   . Stroke ([facility]     SURGICAL HISTORY: Past Surgical History:  Procedure Laterality Date  . NO PAST SURGERIES      SOCIAL HISTORY: Social History   Social History  . Marital status: Married    Spouse name: N/A  . Number of children: 4  . Years of education: 8 TH   Occupational History  . Not on file.   Social History Main Topics  . Smoking status: Former Smoker    Packs/day: 0.50    Years: 1.00    Types: Cigarettes    Start date: 06/17/1968    Quit date: 06/17/1973  . Smokeless tobacco: Never Used     Comment: Quit in 89s . Alcohol use No     Comment: former drinker for approximately  10 years  . Drug use: No  . Sexual activity: Not on file   Other Topics Concern  . Not on file   Social History Narrative   Patient is married with 3 children still living and 1 deceased son.   Patient is right handed.   Patient has 8 th grade education.   Patient drinks 2-3 servings daily.    FAMILY HISTORY: Family History  Problem Relation Age of Onset  . Heart disease Mother   . Cancer Father 69    throat cancer  . Cancer Cousin     breast cancer    ALLERGIES:  has No  Known Allergies.  MEDICATIONS:  Current Outpatient Prescriptions  Medication Sig Dispense Refill  . amLODipine (NORVASC) 10 MG tablet Take 1 tablet (10 mg total) by mouth daily. 90 tablet 3  . aspirin EC 81 MG tablet Take 1 tablet (81 mg total) by mouth daily. 90 tablet 3  . atorvastatin (LIPITOR) 20 MG tablet Take 1 tablet (20 mg total) by mouth daily. 90 tablet 3  . furosemide (LASIX) 20 MG tablet 20 mg daily.     . ondansetron (ZOFRAN) 8 MG tablet Take 1 tablet (8 mg total) by mouth 2 (two) times daily as needed (Nausea or vomiting). 30 tablet 1  . prochlorperazine (COMPAZINE) 10 MG tablet Take 1 tablet (10 mg total) by mouth every 6 (six) hours as needed (Nausea or vomiting). 30 tablet 1  . traMADol (ULTRAM) 50 MG tablet Take 1 tablet (50 mg total) by mouth every 8 (eight) hours as needed. 30 tablet 0   No current facility-administered medications for this visit.        REVIEW OF SYSTEMS:   Constitutional: Denies fevers, chills or abnormal night sweats Eyes: Denies blurriness of vision, double vision or watery eyes Ears, nose, mouth, throat, and face: Denies mucositis or sore throat Respiratory: Denies cough, dyspnea or wheezes Cardiovascular: Denies palpitation, chest discomfort or lower extremity swelling Gastrointestinal:  Denies nausea, heartburn or change in bowel habits Skin: Denies abnormal skin rashes Lymphatics: Denies new lymphadenopathy or easy bruising Neurological:Denies numbness, tingling or new weaknesses Behavioral/Psych: Mood is stable, no new changes  All other systems were reviewed with the patient and are negative.  PHYSICAL EXAMINATION: ECOG PERFORMANCE STATUS: 2  Vitals:   01/31/16 1316  BP: (!) 141/59  Pulse: 71  Resp: 18  Temp: 98.5 F (36.9 C)   Filed Weights   01/31/16 1316  Weight: 180 lb 1.6 oz (81.7 kg)    GENERAL:alert, no distress and comfortable SKIN: skin color, texture, turgor are normal, no rashes or significant lesions EYES:  normal, conjunctiva are pink and non-injected, sclera clear OROPHARYNX:no exudate, no erythema and lips, buccal mucosa, and tongue normal  NECK: supple, thyroid normal size, non-tender, without nodularity LYMPH:  no palpable lymphadenopathy in the cervical, axillary or inguinal LUNGS: clear to auscultation and percussion with normal breathing effort HEART: regular rate & rhythm and no murmurs and no lower extremity edema ABDOMEN:abdomen soft, non-tender and normal bowel sounds Musculoskeletal:no cyanosis of digits and no clubbing  PSYCH: alert & oriented x 3 with fluent speech NEURO: no focal motor/sensory deficits Breasts: Breast inspection showed them to be symmetrical with no nipple discharge. (+) Lymphedema of right breast, no significant skin erythema, palpation of the breasts showed no mass, there is a 6X5cm (initially 4X4.5cm)mass at the anterior right axilla, close to right chest wall of breast margin, no tenderness, left axilla was negative.   LABORATORY DATA:  I have reviewed the data as listed CBC Latest Ref Rng & Units 01/31/2016 01/19/2016 01/10/2016  WBC 3.9 - 10.3 10e3/uL 5.7 5.1 3.9  Hemoglobin 11.6 - 15.9 g/dL 9.6(L) 9.6(L) 10.2(L)  Hematocrit 34.8 - 46.6 % 30.4(L) 30.4(L) 31.7(L)  Platelets 145 - 400 10e3/uL 306 284 267   CMP Latest Ref Rng & Units 01/31/2016 01/19/2016 01/05/2016  Glucose 70 - 140 mg/dl 100 94 87  BUN 7.0 - 26.0 mg/dL 29.5(H) 23.7 25.3  Creatinine 0.6 - 1.1 mg/dL 1.4(H) 1.1 1.2(H)  Sodium 136 - 145 mEq/L 141 141 143  Potassium 3.5 - 5.1 mEq/L 4.0 4.4 4.3  Chloride 96 - 106 mmol/L - - -  CO2 22 - 29 mEq/L _0 Calcium 8.4 - 10.4 mg/dL 9.5 9.3 9.4  Total Protein 6.4 - 8.3 g/dL 8.8(H) 8.1 8.4(H)  Total Bilirubin 0.20 - 1.20 mg/dL 0.42 0.41 0.63  Alkaline Phos 40 - 150 U/L 51 54 57  AST 5 - 34 U/L _1 ALT 0 - 55 U/L _2 PATHOLOGY REPORT: Diagnosis 11/30/2015 Lymph node, needle/core biopsy, right breast/axilla - LYMPH NODE POSITIVE FOR  METASTATIC POORLY DIFFERENTIATED CARCINOMA. - SEE COMMENT. Microscopic Comment Immunohistochemical stains are performed. The tumor is positive for cytokeratin AE1/AE3, cytokeratin 7 and E-cadherin. CDX-2 demonstrates weak positivity. Estrogen receptor demonstrates focal very weak to equivocal staining. Gross cystic disease fluid protein, cytokeratin 20, TTF-1, Melan-A and S100 are all negative. The findings are consistent with a poorly differentiated carcinoma. Given the staining pattern, an upper gastrointestinal and pancreatobiliary primary source should be ruled out. In addition, given the axillary location of the biopsy and focal weak to equivocal estrogen receptor staining, ruling out a poorly differentiated carcinoma of breast primary source is also prudent. Lastly, a gynecologic primary may be considered. Correlation with clinical and radiologic impression is essential. Dr. Vicente Males has seen this case in consultation with essential agreement of the above diagnosis and comment. The findings are called to the [facility] on 12/04/15. (RAH:gt, 12/04/15)  Diagnosis 12/27/2015 Breast, right, needle core biopsy, central - INVASIVE MAMMARY CARCINOMA. - MAMMARY CARCINOMA IN SITU. - LYMPH/VASCULAR INVASION IS IDENTIFIED. - SEE COMMENT. Microscopic Comment An E-cadherin stain will be performed to determine if the carcinoma is ductal or lobular in nature. The results of the stain will be reported in an addendum to follow. Although definitive grading of breast carcinoma is best done on excision, the features of the invasive tumor from the right central breast biopsy are compatible with a grade 3 breast carcinoma. Breast prognostic markers will be performed and reported in an addendum. Findings are called to the [facility] on 12/28/2015. Dr. Lyndon Code has seen this case in consultation with agreement. (RH:kh 12-28-15) ADDENDUM: An E-cadherin immunohistochemical stain is  performed which is positive in both the invasive and in situ components confirming the ductal nature of both (i.e. invasive ductal carcinoma with ductal carcinoma in situ). (RAH:gt, 12/29/15) PROGNOSTIC INDICATORS Results: IMMUNOHISTOCHEMICAL AND MORPHOMETRIC ANALYSIS PERFORMED MANUALLY Estrogen Receptor: 0%, NEGATIVE Progesterone Receptor: 0%, NEGATIVE Proliferation Marker Ki67: 70% Results: HER2 - NEGATIVE RATIO OF HER2/CEP17 SIGNALS 1.13 AVERAGE HER2 COPY NUMBER PER CELL 1.75   RADIOGRAPHIC STUDIES: I have personally reviewed the radiological images as listed and agreed with the findings in the report. Nm Pet Image Initial (pi) Skull Base To Thigh  Result Date: 01/03/2016 CLINICAL DATA:  Initial treatment strategy for metastatic right axillary lymphadenopathy. Unknown primary. EXAM: NUCLEAR MEDICINE  PET SKULL BASE TO THIGH TECHNIQUE: 9.0 mCi F-18 FDG was injected intravenously. Full-ring PET imaging was performed from the skull base to thigh after the radiotracer. CT data was obtained and used for attenuation correction and anatomic localization. FASTING BLOOD GLUCOSE:  Value: 93 mg/dl COMPARISON:  Neck and chest CT on 09/27/2015 FINDINGS: NECK 10 mm right level 5 posterior triangle lymph node is seen on image 32/4, which is hypermetabolic with SUV max of 89.3. 15 mm right supraclavicular lymph node on image 81/0 hypermetabolic with SUV max of 8.5. No hypermetabolic lymph nodes seen in the left neck. CHEST Diffuse hypermetabolic activity is seen throughout the right breast corresponding to diffuse asymmetric soft tissue density and skin thickening, consistent with inflammatory breast carcinoma. Bulky hypermetabolic lymphadenopathy is seen in the right axilla, with index lymph node measuring 3.4 cm on image 57/ 4. Right axillary lymphadenopathy has SUV max of 26.1. Mild hypermetabolic lymphadenopathy is also seen in the left axilla, with SUV max of 17.2. Mild hypermetabolic lymphadenopathy is  also seen in the subpectoral regions bilaterally. Mild hypermetabolic lymphadenopathy noted in the right internal mammary chain, with SUV max of 6.1 on image 68/3. No other sites of hypermetabolic lymphadenopathy within the mediastinum or hilar regions. No suspicious pulmonary nodules identified on CT images. ABDOMEN/PELVIS No abnormal hypermetabolic activity within the liver, pancreas, adrenal glands, or spleen. No hypermetabolic lymph nodes in the abdomen or pelvis. Tiny calcified uterine fibroids noted. Aortic atherosclerosis noted. Colonic diverticulosis noted, without evidence of diverticulitis. SKELETON No focal hypermetabolic activity to suggest skeletal metastasis. IMPRESSION: Diffuse asymmetric right breast hypermetabolic soft tissue density and skin thickening, consistent with inflammatory breast carcinoma. Hypermetabolic metastatic lymphadenopathy in bilateral axillary and subpectoral regions, right internal mammary chain, right supraclavicular region, and right neck posterior triangle, level 5. No evidence metastatic disease within the abdomen or pelvis. Incidentally noted aortic atherosclerosis, tiny calcified uterine fibroids, and colonic diverticulosis. Electronically Signed   By: Earle Gell M.D.   On: 01/03/2016 11:31   DIAGNOSTIC MAMMOGRAM OF RIGHT BREAST 12/27/2015 FINDINGS: CC and MLO views of the right breast were performed with tomosynthesis. The right breast demonstrates diffuse skin thickening and increased density. There is right axillary adenopathy. No focal mass is identified within the breast.  Mammographic images were processed with CAD.  On physical exam, there is palpable right axillary adenopathy including a 5 cm right axillary lymph node. The right breast and nipple are asymmetrically enlarged relative to the left.  Targeted ultrasound is performed, showing numerous enlarged right axillary lymph nodes, the largest measuring 5.3 x 4.2 x 2.6  cm.  IMPRESSION: Suspicious right axillary mass.  Ct CHEST WITH CONTRAST 09/27/2015 IMPRESSION: 1. 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 The right breast is enlarged with increased soft tissue attenuation, skin thickening and possible nipple retraction. There are bulky right axillary lymph nodes with smaller right supraclavicular lymph nodes. 2. No other evidence of metastatic disease. 3. Mild cardiomegaly. 4. No acute findings in the lungs.   ASSESSMENT & PLAN:  70 year old female, with past medical history of HTN, GERD, sleep apnea, presented with bulky right axillary adenopathy.  1. 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 Unfortunately her cancer has metastasized to left axilla and cervical lymph nodes, which are distant metastasis. Her case was discussed in tumor board, and left axilla biopsy was recommend to confirm distant mets, I discussed with pt today and she agrees, I will arrange the biopsy asap.  -We reviewed the natural history of metastatic triple negative breast cancer, which is very aggressive, and her cancer is incurable at this stage  -Giving the metastatic disease, surgery up front is not indicated, I recommend systemic chemotherapy. -She has started weekly paclitaxel 3 weeks ago, has been not compliant since then. Her right axillary adenopathy has increased in size. I strongly encouraged her to continue chemotherapy. I think some of her fatigue are related to her underlying malignancy. -She does not feel  well enough to have chemotherapy today, but is agreeable to return next Tuesday to start second dose chemotherapy. -I plan to repeat her CT or PET scan after 2-3 months of treatment.  2. ANEMIA  -She developed normocytic mild anemia in the past few months, hemoglobin 9.9 -No clinical signs of bleeding. Possible related to her underlying malignancy -Her anemia workup shows a normal folic acid and W97 level, increased ferritin, decreased serum iron, TIBC and transferrin saturation. This is most consistent with anemia of chronic disease, secondary to her CAD and underlying malignancy. -I encourage her to take oral iron supplement   3. CKD stage III -Her GFR is in 40-50's  -No lab signs of tumor lysis -Possibly related to her underlying hypertension -Avoid nephrotoxins, such as NSAIDs and IV CT contrast  4. HTN, GERD, sleep apnea -She'll continue follow-up with her primary care physician -She takes Lasix 20 mg daily, however her renal function has gotten worse daily, creatinine 1.4 today, I recommend her to decrease Lasix to 20 mg every other day  5. Leg swelling and pain  -Symmetric, mainly in her feet and ankles, likely related to her hypoalbuminemia -I recommend her to elevate her legs when she sits or laying down -lasix as needed -I refilled her tramadol today   PLAN -Left axillary node biopsy at breast center in the next few weeks -She will return next Tuesday to start chemotherapy again -I plan to see her back in 2 weeks -reduced lasix to 41m every other day -I refilled her tramadol    I had long conversation with pt and her daughter, reviewed her cancer diagnosis, staging, prognosis and treatment options again. All questions were answered. The patient knows to call the clinic with any problems, questions or concerns.  I spent 35 minutes counseling the patient face to face. The total time spent in the appointment was 40 minute s and more than 50% was on counseling.     FTruitt Merle MD 01/31/2016 10:50 PM

## 2016-01-31 NOTE — Progress Notes (Signed)
Met with patient after appointment and her daughter to discuss [employer] grant and what was needed to apply. Discussed how it may help during treatment financially. Patient will bring proof of income at her next visit to apply. She has a copy of the expense sheet of items that may be covered. Patient has my card for any additional financial questions or concerns.

## 2016-02-06 ENCOUNTER — Other Ambulatory Visit: Payer: Self-pay

## 2016-02-06 ENCOUNTER — Other Ambulatory Visit (HOSPITAL_BASED_OUTPATIENT_CLINIC_OR_DEPARTMENT_OTHER): Payer: Commercial Managed Care - HMO

## 2016-02-06 ENCOUNTER — Other Ambulatory Visit: Payer: Self-pay | Admitting: Hematology

## 2016-02-06 ENCOUNTER — Ambulatory Visit (HOSPITAL_BASED_OUTPATIENT_CLINIC_OR_DEPARTMENT_OTHER): Payer: Commercial Managed Care - HMO

## 2016-02-06 ENCOUNTER — Encounter: Payer: Self-pay | Admitting: Hematology

## 2016-02-06 VITALS — BP 156/86 | HR 70 | Temp 98.2°F | Resp 16

## 2016-02-06 DIAGNOSIS — C50911 Malignant neoplasm of unspecified site of right female breast: Secondary | ICD-10-CM

## 2016-02-06 DIAGNOSIS — Z5111 Encounter for antineoplastic chemotherapy: Secondary | ICD-10-CM | POA: Diagnosis not present

## 2016-02-06 LAB — CBC WITH DIFFERENTIAL/PLATELET
BASO%: 1.4 % (ref 0.0–2.0)
Basophils Absolute: 0.1 10*3/uL (ref 0.0–0.1)
EOS ABS: 0.1 10*3/uL (ref 0.0–0.5)
EOS%: 2.5 % (ref 0.0–7.0)
HEMATOCRIT: 30.7 % — AB (ref 34.8–46.6)
HGB: 9.7 g/dL — ABNORMAL LOW (ref 11.6–15.9)
LYMPH#: 0.8 10*3/uL — AB (ref 0.9–3.3)
LYMPH%: 15.9 % (ref 14.0–49.7)
MCH: 25 pg — ABNORMAL LOW (ref 25.1–34.0)
MCHC: 31.5 g/dL (ref 31.5–36.0)
MCV: 79.3 fL — AB (ref 79.5–101.0)
MONO#: 0.7 10*3/uL (ref 0.1–0.9)
MONO%: 13.3 % (ref 0.0–14.0)
NEUT%: 66.9 % (ref 38.4–76.8)
NEUTROS ABS: 3.4 10*3/uL (ref 1.5–6.5)
PLATELETS: 330 10*3/uL (ref 145–400)
RBC: 3.87 10*6/uL (ref 3.70–5.45)
RDW: 16 % — ABNORMAL HIGH (ref 11.2–14.5)
WBC: 5.1 10*3/uL (ref 3.9–10.3)

## 2016-02-06 MED ORDER — DIPHENHYDRAMINE HCL 50 MG/ML IJ SOLN
INTRAMUSCULAR | Status: AC
  Filled 2016-02-06: qty 1

## 2016-02-06 MED ORDER — SODIUM CHLORIDE 0.9 % IV SOLN
Freq: Once | INTRAVENOUS | Status: AC
  Administered 2016-02-06: 15:00:00 via INTRAVENOUS

## 2016-02-06 MED ORDER — PACLITAXEL CHEMO INJECTION 300 MG/50ML
80.0000 mg/m2 | Freq: Once | INTRAVENOUS | Status: DC
  Filled 2016-02-06: qty 26

## 2016-02-06 MED ORDER — DIPHENHYDRAMINE HCL 50 MG/ML IJ SOLN
25.0000 mg | Freq: Once | INTRAMUSCULAR | Status: AC
Start: 2016-02-06 — End: 2016-02-06
  Administered 2016-02-06: 25 mg via INTRAVENOUS

## 2016-02-06 MED ORDER — SODIUM CHLORIDE 0.9 % IV SOLN
10.0000 mg | Freq: Once | INTRAVENOUS | Status: AC
  Administered 2016-02-06: 10 mg via INTRAVENOUS
  Filled 2016-02-06: qty 1

## 2016-02-06 MED ORDER — SODIUM CHLORIDE 0.9 % IV SOLN
80.0000 mg/m2 | Freq: Once | INTRAVENOUS | Status: AC
  Administered 2016-02-06: 156 mg via INTRAVENOUS
  Filled 2016-02-06: qty 26

## 2016-02-06 MED ORDER — FAMOTIDINE IN NACL 20-0.9 MG/50ML-% IV SOLN
20.0000 mg | Freq: Once | INTRAVENOUS | Status: AC
Start: 2016-02-06 — End: 2016-02-06
  Administered 2016-02-06: 20 mg via INTRAVENOUS

## 2016-02-06 MED ORDER — FAMOTIDINE IN NACL 20-0.9 MG/50ML-% IV SOLN
INTRAVENOUS | Status: AC
  Filled 2016-02-06: qty 50

## 2016-02-06 NOTE — Patient Instructions (Signed)
[facility] Discharge Instructions for Patients Receiving Chemotherapy  Today you received the following chemotherapy agents taxol  To help prevent nausea and vomiting after your treatment, we encourage you to take your nausea medication as directed   If you develop nausea and vomiting that is not controlled by your nausea medication, call the clinic.   BELOW ARE SYMPTOMS THAT SHOULD BE REPORTED IMMEDIATELY:  *FEVER GREATER THAN 100.5 F  *CHILLS WITH OR WITHOUT FEVER  NAUSEA AND VOMITING THAT IS NOT CONTROLLED WITH YOUR NAUSEA MEDICATION  *UNUSUAL SHORTNESS OF BREATH  *UNUSUAL BRUISING OR BLEEDING  TENDERNESS IN MOUTH AND THROAT WITH OR WITHOUT PRESENCE OF ULCERS  *URINARY PROBLEMS  *BOWEL PROBLEMS  UNUSUAL RASH Items with * indicate a potential emergency and should be followed up as soon as possible.  Feel free to call the clinic you have any questions or concerns. The clinic phone number is [phone].  

## 2016-02-06 NOTE — Progress Notes (Signed)
Met with patient and daughter in my office who brought proof of income for [employer]. Patient approved for one-time $1000 grant. Patient has a copy of approval as well as expense sheet along with our Outpatient pharmacy information. Patient was issued a gas card today from her grant as requested. Patient asked about car repairs to help her get to and from treatment. Gave patient information to Dealer that assists our patients and is familiar with grant([employer]) [address] [phone]. Patient has my card for any additional financial questions or concerns. Advised patient if there is any other assistance available for her, I will reach out to her and ask if I may apply on her behalf. Patient verbalized understanding.

## 2016-02-07 ENCOUNTER — Ambulatory Visit
Admission: RE | Admit: 2016-02-07 | Discharge: 2016-02-07 | Disposition: A | Discharge status: Home / Self Care | Payer: Commercial Managed Care - HMO | Source: Ambulatory Visit | Attending: Hematology | Admitting: Hematology

## 2016-02-07 DIAGNOSIS — N63 Unspecified lump in unspecified breast: Secondary | ICD-10-CM

## 2016-02-07 DIAGNOSIS — R590 Localized enlarged lymph nodes: Secondary | ICD-10-CM | POA: Diagnosis not present

## 2016-02-07 DIAGNOSIS — C773 Secondary and unspecified malignant neoplasm of axilla and upper limb lymph nodes: Secondary | ICD-10-CM | POA: Diagnosis not present

## 2016-02-07 DIAGNOSIS — C50911 Malignant neoplasm of unspecified site of right female breast: Secondary | ICD-10-CM

## 2016-02-13 ENCOUNTER — Ambulatory Visit (HOSPITAL_BASED_OUTPATIENT_CLINIC_OR_DEPARTMENT_OTHER): Payer: Commercial Managed Care - HMO | Admitting: Hematology

## 2016-02-13 ENCOUNTER — Ambulatory Visit (HOSPITAL_BASED_OUTPATIENT_CLINIC_OR_DEPARTMENT_OTHER): Payer: Commercial Managed Care - HMO

## 2016-02-13 ENCOUNTER — Other Ambulatory Visit (HOSPITAL_BASED_OUTPATIENT_CLINIC_OR_DEPARTMENT_OTHER): Payer: Commercial Managed Care - HMO

## 2016-02-13 ENCOUNTER — Encounter: Payer: Self-pay | Admitting: Hematology

## 2016-02-13 VITALS — BP 147/60 | HR 68 | Temp 98.2°F | Resp 18 | Ht 64.0 in | Wt 178.3 lb

## 2016-02-13 DIAGNOSIS — C778 Secondary and unspecified malignant neoplasm of lymph nodes of multiple regions: Secondary | ICD-10-CM

## 2016-02-13 DIAGNOSIS — Z171 Estrogen receptor negative status [ER-]: Secondary | ICD-10-CM

## 2016-02-13 DIAGNOSIS — C50911 Malignant neoplasm of unspecified site of right female breast: Secondary | ICD-10-CM

## 2016-02-13 DIAGNOSIS — E669 Obesity, unspecified: Secondary | ICD-10-CM

## 2016-02-13 DIAGNOSIS — D649 Anemia, unspecified: Secondary | ICD-10-CM | POA: Diagnosis not present

## 2016-02-13 DIAGNOSIS — I10 Essential (primary) hypertension: Secondary | ICD-10-CM

## 2016-02-13 DIAGNOSIS — Z5111 Encounter for antineoplastic chemotherapy: Secondary | ICD-10-CM | POA: Diagnosis not present

## 2016-02-13 DIAGNOSIS — R770 Abnormality of albumin: Secondary | ICD-10-CM

## 2016-02-13 DIAGNOSIS — Z87891 Personal history of nicotine dependence: Secondary | ICD-10-CM

## 2016-02-13 DIAGNOSIS — N183 Chronic kidney disease, stage 3 (moderate): Secondary | ICD-10-CM

## 2016-02-13 LAB — COMPREHENSIVE METABOLIC PANEL
ALT: 18 U/L (ref 0–55)
AST: 18 U/L (ref 5–34)
Albumin: 3.5 g/dL (ref 3.5–5.0)
Alkaline Phosphatase: 50 U/L (ref 40–150)
Anion Gap: 9 mEq/L (ref 3–11)
BILIRUBIN TOTAL: 0.37 mg/dL (ref 0.20–1.20)
BUN: 35.8 mg/dL — ABNORMAL HIGH (ref 7.0–26.0)
CHLORIDE: 108 meq/L (ref 98–109)
CO2: 23 meq/L (ref 22–29)
CREATININE: 1.7 mg/dL — AB (ref 0.6–1.1)
Calcium: 9.4 mg/dL (ref 8.4–10.4)
EGFR: 36 mL/min/{1.73_m2} — ABNORMAL LOW (ref >90)
GLUCOSE: 97 mg/dL (ref 70–140)
Potassium: 4.3 mEq/L (ref 3.5–5.1)
SODIUM: 140 meq/L (ref 136–145)
TOTAL PROTEIN: 8.9 g/dL — AB (ref 6.4–8.3)

## 2016-02-13 LAB — CBC WITH DIFFERENTIAL/PLATELET
BASO%: 1 % (ref 0.0–2.0)
Basophils Absolute: 0 10*3/uL (ref 0.0–0.1)
EOS%: 2.8 % (ref 0.0–7.0)
Eosinophils Absolute: 0.1 10*3/uL (ref 0.0–0.5)
HCT: 30.3 % — ABNORMAL LOW (ref 34.8–46.6)
HGB: 9.6 g/dL — ABNORMAL LOW (ref 11.6–15.9)
LYMPH%: 23.6 % (ref 14.0–49.7)
MCH: 25.5 pg (ref 25.1–34.0)
MCHC: 31.7 g/dL (ref 31.5–36.0)
MCV: 80.4 fL (ref 79.5–101.0)
MONO#: 0.3 10*3/uL (ref 0.1–0.9)
MONO%: 8.3 % (ref 0.0–14.0)
NEUT%: 64.3 % (ref 38.4–76.8)
NEUTROS ABS: 2.5 10*3/uL (ref 1.5–6.5)
Platelets: 302 10*3/uL (ref 145–400)
RBC: 3.77 10*6/uL (ref 3.70–5.45)
RDW: 15.2 % — ABNORMAL HIGH (ref 11.2–14.5)
WBC: 3.9 10*3/uL (ref 3.9–10.3)
lymph#: 0.9 10*3/uL (ref 0.9–3.3)

## 2016-02-13 MED ORDER — SODIUM CHLORIDE 0.9 % IV SOLN
Freq: Once | INTRAVENOUS | Status: AC
  Administered 2016-02-13: 15:00:00 via INTRAVENOUS

## 2016-02-13 MED ORDER — FAMOTIDINE IN NACL 20-0.9 MG/50ML-% IV SOLN
20.0000 mg | Freq: Once | INTRAVENOUS | Status: AC
  Administered 2016-02-13: 20 mg via INTRAVENOUS

## 2016-02-13 MED ORDER — DIPHENHYDRAMINE HCL 50 MG/ML IJ SOLN
25.0000 mg | Freq: Once | INTRAMUSCULAR | Status: AC
  Administered 2016-02-13: 25 mg via INTRAVENOUS

## 2016-02-13 MED ORDER — DIPHENHYDRAMINE HCL 50 MG/ML IJ SOLN
INTRAMUSCULAR | Status: AC
  Filled 2016-02-13: qty 1

## 2016-02-13 MED ORDER — FAMOTIDINE IN NACL 20-0.9 MG/50ML-% IV SOLN
INTRAVENOUS | Status: AC
  Filled 2016-02-13: qty 50

## 2016-02-13 MED ORDER — SODIUM CHLORIDE 0.9 % IV SOLN
10.0000 mg | Freq: Once | INTRAVENOUS | Status: AC
  Administered 2016-02-13: 10 mg via INTRAVENOUS
  Filled 2016-02-13: qty 1

## 2016-02-13 MED ORDER — PALONOSETRON HCL INJECTION 0.25 MG/5ML
INTRAVENOUS | Status: AC
  Filled 2016-02-13: qty 5

## 2016-02-13 MED ORDER — SODIUM CHLORIDE 0.9 % IV SOLN
80.0000 mg/m2 | Freq: Once | INTRAVENOUS | Status: AC
  Administered 2016-02-13: 156 mg via INTRAVENOUS
  Filled 2016-02-13: qty 26

## 2016-02-13 NOTE — Progress Notes (Signed)
Per Dr. [name], ok to treat with Creat of 1.7.

## 2016-02-13 NOTE — Patient Instructions (Signed)
[facility] Discharge Instructions for Patients Receiving Chemotherapy  Today you received the following chemotherapy agent: Taxol   To help prevent nausea and vomiting after your treatment, we encourage you to take your nausea medication as directed  If you develop nausea and vomiting that is not controlled by your nausea medication, call the clinic.   BELOW ARE SYMPTOMS THAT SHOULD BE REPORTED IMMEDIATELY:  *FEVER GREATER THAN 100.5 F  *CHILLS WITH OR WITHOUT FEVER  NAUSEA AND VOMITING THAT IS NOT CONTROLLED WITH YOUR NAUSEA MEDICATION  *UNUSUAL SHORTNESS OF BREATH  *UNUSUAL BRUISING OR BLEEDING  TENDERNESS IN MOUTH AND THROAT WITH OR WITHOUT PRESENCE OF ULCERS  *URINARY PROBLEMS  *BOWEL PROBLEMS  UNUSUAL RASH Items with * indicate a potential emergency and should be followed up as soon as possible.  Feel free to call the clinic you have any questions or concerns. The clinic phone number is [phone].  

## 2016-02-13 NOTE — Progress Notes (Signed)
[name]  [phone] [phone]  Clinic Follow Up Note   Patient Care Team: [name], MD as PCP - General (Internal Medicine) 02/13/2016   CHIEF COMPLAINTS:  Follow up right breast cancer   HISTORY OF PRESENTING ILLNESS (12/14/2015) :  [name] 70 y.o. 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 She is accompanied by her husband to the clinic today.  She noticed a lump at right axilla in 08/2015, no pain or other complains, she also report right breast swelling, no pain, skin erythema, nipple discharge or other complain. She otherwise feels well. She was initially seen at urgent care in March, then was referred to establish her care with prior care physician at [facility] center. Her screening mammogram was negative in January 2017. She subsequently underwent CT chest on 09/27/2015 which showed enlarged and inflamed right breast, bulky right axillary lymph nodes. She was referred to breast Center for diagnostic mammogram and ultrasound of right breast, which was negative. She underwent ultrasound guided right axillary node biopsy on 12/05/2015, which reviewed port differentiated carcinoma. ER weakly focally positive.  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 She works independently. She lives with her husband. She denies any fever, night sweats, chills, or skin itchiness.  CURRENT THERAPY: chemotherapy with weekly Taxol, started on 01/10/2016, second cycle was delayed due to pt's non-compliance   INTERIM HISTORY:  [name] for follow up. She is accompanied by her husband to  clinic today. He tolerated chemotherapy very well last week, has good appetite and energy level, denies any significant side effects. No other new complaints  MEDICAL HISTORY:  Past Medical History:  Diagnosis Date  . Arthritis   . GERD (gastroesophageal reflux disease)   . Hypertension   . Shortness of breath   . Sleep apnea   . Stroke ([facility]     SURGICAL HISTORY: Past Surgical History:  Procedure Laterality Date  . NO PAST SURGERIES      SOCIAL HISTORY: Social History   Social History  . Marital status: Married    Spouse name: N/A  . Number of children: 4  . Years of education: 8 TH   Occupational History  . Not on file.   Social History Main Topics  . Smoking status: Former Smoker    Packs/day: 0.50    Years: 1.00    Types: Cigarettes    Start date: 06/17/1968    Quit date: 06/17/1973  . Smokeless tobacco: Never Used     Comment: Quit in 159s . Alcohol use No     Comment: former drinker for approximately 10 years  . Drug use: No  . Sexual activity: Not on file   Other Topics Concern  . Not on file   Social History Narrative   Patient is married with 3 children still living and 1 deceased son.   Patient is right handed.   Patient has 8 th grade education.   Patient drinks 2-3 servings daily.    FAMILY HISTORY: Family History  Problem Relation Age of Onset  . Heart disease  Mother   . Cancer Father 32    throat cancer  . Cancer Cousin     breast cancer    ALLERGIES:  has No Known Allergies.  MEDICATIONS:  Current Outpatient Prescriptions  Medication Sig Dispense Refill  . amLODipine (NORVASC) 10 MG tablet Take 1 tablet (10 mg total) by mouth daily. 90 tablet 3  . aspirin EC 81 MG tablet Take 1 tablet (81 mg total) by mouth daily. 90 tablet 3  . atorvastatin (LIPITOR) 20 MG tablet Take 1 tablet (20 mg total) by mouth daily. 90 tablet 3  . furosemide (LASIX) 20 MG tablet 20 mg daily.     . traMADol (ULTRAM) 50 MG tablet Take 1 tablet (50 mg total)  by mouth every 8 (eight) hours as needed. 30 tablet 0  . ondansetron (ZOFRAN) 8 MG tablet Take 1 tablet (8 mg total) by mouth 2 (two) times daily as needed (Nausea or vomiting). (Patient not taking: Reported on 02/13/2016) 30 tablet 1  . prochlorperazine (COMPAZINE) 10 MG tablet Take 1 tablet (10 mg total) by mouth every 6 (six) hours as needed (Nausea or vomiting). (Patient not taking: Reported on 02/13/2016) 30 tablet 1   No current facility-administered medications for this visit.        REVIEW OF SYSTEMS:   Constitutional: Denies fevers, chills or abnormal night sweats Eyes: Denies blurriness of vision, double vision or watery eyes Ears, nose, mouth, throat, and face: Denies mucositis or sore throat Respiratory: Denies cough, dyspnea or wheezes Cardiovascular: Denies palpitation, chest discomfort or lower extremity swelling Gastrointestinal:  Denies nausea, heartburn or change in bowel habits Skin: Denies abnormal skin rashes Lymphatics: Denies new lymphadenopathy or easy bruising Neurological:Denies numbness, tingling or new weaknesses Behavioral/Psych: Mood is stable, no new changes  All other systems were reviewed with the patient and are negative.  PHYSICAL EXAMINATION: ECOG PERFORMANCE STATUS: 1  Vitals:   02/13/16 1315  BP: (!) 147/60  Pulse: 68  Resp: 18  Temp: 98.2 F (36.8 C)   Filed Weights   02/13/16 1315  Weight: 178 lb 4.8 oz (80.9 kg)    GENERAL:alert, no distress and comfortable SKIN: skin color, texture, turgor are normal, no rashes or significant lesions EYES: normal, conjunctiva are pink and non-injected, sclera clear OROPHARYNX:no exudate, no erythema and lips, buccal mucosa, and tongue normal  NECK: supple, thyroid normal size, non-tender, without nodularity LYMPH:  no palpable lymphadenopathy in the cervical, axillary or inguinal LUNGS: clear to auscultation and percussion with normal breathing effort HEART: regular rate & rhythm and no murmurs and no  lower extremity edema ABDOMEN:abdomen soft, non-tender and normal bowel sounds Musculoskeletal:no cyanosis of digits and no clubbing  PSYCH: alert & oriented x 3 with fluent speech NEURO: no focal motor/sensory deficits Breasts: Breast inspection showed them to be symmetrical with no nipple discharge. (+) Lymphedema of right breast, no significant skin erythema, palpation of the breasts showed no mass, there is a 6X5cm (initially 4X4.5cm)mass at the anterior right axilla, close to right chest wall of breast margin, no tenderness, left axilla was negative.   LABORATORY DATA:  I have reviewed the data as listed CBC Latest Ref Rng & Units 02/13/2016 02/06/2016 01/31/2016  WBC 3.9 - 10.3 10e3/uL 3.9 5.1 5.7  Hemoglobin 11.6 - 15.9 g/dL 9.6(L) 9.7(L) 9.6(L)  Hematocrit 34.8 - 46.6 % 30.3(L) 30.7(L) 30.4(L)  Platelets 145 - 400 10e3/uL 302 330 306   CMP Latest Ref Rng & Units 02/13/2016 01/31/2016 01/19/2016  Glucose 70 - 140 mg/dl  97 100 94  BUN 7.0 - 26.0 mg/dL 35.8(H) 29.5(H) 23.7  Creatinine 0.6 - 1.1 mg/dL 1.7(H) 1.4(H) 1.1  Sodium 136 - 145 mEq/L 140 141 141  Potassium 3.5 - 5.1 mEq/L 4.3 4.0 4.4  Chloride 96 - 106 mmol/L - - -  CO2 22 - 29 mEq/L _0 Calcium 8.4 - 10.4 mg/dL 9.4 9.5 9.3  Total Protein 6.4 - 8.3 g/dL 8.9(H) 8.8(H) 8.1  Total Bilirubin 0.20 - 1.20 mg/dL 0.37 0.42 0.41  Alkaline Phos 40 - 150 U/L 50 51 54  AST 5 - 34 U/L _1 ALT 0 - 55 U/L _2 PATHOLOGY REPORT: Diagnosis 11/30/2015 Lymph node, needle/core biopsy, right breast/axilla - LYMPH NODE POSITIVE FOR METASTATIC POORLY DIFFERENTIATED CARCINOMA. - SEE COMMENT. Microscopic Comment Immunohistochemical stains are performed. The tumor is positive for cytokeratin AE1/AE3, cytokeratin 7 and E-cadherin. CDX-2 demonstrates weak positivity. Estrogen receptor demonstrates focal very weak to equivocal staining. Gross cystic disease fluid protein, cytokeratin 20, TTF-1, Melan-A and S100 are all negative. The  findings are consistent with a poorly differentiated carcinoma. Given the staining pattern, an upper gastrointestinal and pancreatobiliary primary source should be ruled out. In addition, given the axillary location of the biopsy and focal weak to equivocal estrogen receptor staining, ruling out a poorly differentiated carcinoma of breast primary source is also prudent. Lastly, a gynecologic primary may be considered. Correlation with clinical and radiologic impression is essential. Dr. Vicente Males has seen this case in consultation with essential agreement of the above diagnosis and comment. The findings are called to the [facility] on 12/04/15. (RAH:gt, 12/04/15)  Diagnosis 12/27/2015 Breast, right, needle core biopsy, central - INVASIVE MAMMARY CARCINOMA. - MAMMARY CARCINOMA IN SITU. - LYMPH/VASCULAR INVASION IS IDENTIFIED. - SEE COMMENT. Microscopic Comment An E-cadherin stain will be performed to determine if the carcinoma is ductal or lobular in nature. The results of the stain will be reported in an addendum to follow. Although definitive grading of breast carcinoma is best done on excision, the features of the invasive tumor from the right central breast biopsy are compatible with a grade 3 breast carcinoma. Breast prognostic markers will be performed and reported in an addendum. Findings are called to the [facility] on 12/28/2015. Dr. Lyndon Code has seen this case in consultation with agreement. (RH:kh 12-28-15) ADDENDUM: An E-cadherin immunohistochemical stain is performed which is positive in both the invasive and in situ components confirming the ductal nature of both (i.e. invasive ductal carcinoma with ductal carcinoma in situ). (RAH:gt, 12/29/15) PROGNOSTIC INDICATORS Results: IMMUNOHISTOCHEMICAL AND MORPHOMETRIC ANALYSIS PERFORMED MANUALLY Estrogen Receptor: 0%, NEGATIVE Progesterone Receptor: 0%, NEGATIVE Proliferation Marker Ki67:  70% Results: HER2 - NEGATIVE RATIO OF HER2/CEP17 SIGNALS 1.13 AVERAGE HER2 COPY NUMBER PER CELL 1.75  Diagnosis 02/07/2016 Lymph node, needle/core biopsy, left axillary - METASTATIC POORLY DIFFERENTIATED CARCINOMA. Results: IMMUNOHISTOCHEMICAL AND MORPHOMETRIC ANALYSIS PERFORMED MANUALLY Estrogen Receptor: 0%, NEGATIVE Progesterone Receptor: 0%, NEGATIVE Results: HER2 - NEGATIVE RATIO OF HER2/CEP17 SIGNALS 1.21 AVERAGE HER2 COPY NUMBER PER CELL 2.05  RADIOGRAPHIC STUDIES: I have personally reviewed the radiological images as listed and agreed with the findings in the report. [place] Lt Breast Bx W Loc Dev 1st Lesion Img Bx Spec US Guide  Addendum Date: 02/08/2016   ADDENDUM REPORT: 02/08/2016 12:57 ADDENDUM: Pathology revealed METASTATIC POORLY DIFFERENTIATED CARCINOMA of the Left axillary lymph node. This was found to be concordant by Dr. Peggye Fothergill. Pathology results were discussed with the patient  by telephone. The patient reported doing well after the biopsy with tenderness at the site. Post biopsy instructions and care were reviewed and questions were answered. The patient was encouraged to call [facility] for any additional concerns. The patient has a recent diagnosis of right breast cancer and should follow her outlined treatment plan. The patient has an appointment with Dr. Truitt Merle at [facility] on February 13, 2016. Pathology results reported by Terie Purser, RN on 02/08/2016. Electronically Signed   By: Evangeline Dakin M.D.   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 Sampling of a left axillary node is requested. EXAM: ULTRASOUND GUIDED CORE NEEDLE BIOPSY OF A LEFT AXILLARY NODE COMPARISON:  Previous  exam(s). FINDINGS: I met with the patient and we discussed the procedure of ultrasound-guided biopsy, including benefits and alternatives. We discussed the high likelihood of a successful procedure. We discussed the risks of the procedure, including infection, bleeding, tissue injury, clip migration, and inadequate sampling. Informed written consent was given. The usual time-out protocol was performed immediately prior to the procedure. Using sterile technique with chlorhexidine and skin antisepsis, 1% Lidocaine as local anesthetic, under direct ultrasound visualization, a 14 gauge Bard Marquee core needle device was used to perform biopsy of a pathologic left axillary lymph node using an inferolateral approach. At the conclusion of the procedure a Hydromark spiral shaped tissue marker clip was deployed into the biopsy cavity. IMPRESSION: Ultrasound guided biopsy of a pathologic left axillary lymph node. No apparent complications. Electronically Signed: By: Evangeline Dakin M.D. On: 02/07/2016 15:39   DIAGNOSTIC MAMMOGRAM OF RIGHT BREAST 12/27/2015 FINDINGS: CC and MLO views of the right breast were performed with tomosynthesis. The right breast demonstrates diffuse skin thickening and increased density. There is right axillary adenopathy. No focal mass is identified within the breast.  Mammographic images were processed with CAD.  On physical exam, there is palpable right axillary adenopathy including a 5 cm right axillary lymph node. The right breast and nipple are asymmetrically enlarged relative to the left.  Targeted ultrasound is performed, showing numerous enlarged right axillary lymph nodes, the largest measuring 5.3 x 4.2 x 2.6 cm.  IMPRESSION: Suspicious right axillary mass.  Ct CHEST WITH CONTRAST 09/27/2015 IMPRESSION: 1. 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 The right breast is enlarged with increased soft tissue attenuation, skin thickening and possible  nipple retraction. There are bulky right axillary lymph nodes with smaller right supraclavicular lymph nodes. 2. No other evidence of metastatic disease. 3. Mild cardiomegaly. 4. No acute findings in the lungs.   ASSESSMENT & PLAN:  70 year old female, with past medical history of HTN, GERD, sleep apnea, presented with bulky right axillary adenopathy.  1. 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 Unfortunately her cancer has metastasized to left axilla and cervical lymph nodes, which are distant metastasis.  -I discussed her left axillary node biopsy, which  confirmed metastasis  -We reviewed the natural history of metastatic triple negative breast cancer, which is very aggressive, and her cancer is incurable at this stage  -Giving the metastatic disease, surgery up front is not indicated, I recommend systemic chemotherapy. -She has started weekly Taxol, tolerating well, we will continue -I plan to repeat her CT or PET scan after 2-3 months of treatment.  2. ANEMIA  -She developed normocytic mild anemia in the past few months, hemoglobin 9.9  -No clinical signs of bleeding. Possible related to her underlying malignancy -Her anemia workup shows a normal folic acid and Q22 level, increased ferritin, decreased serum iron, TIBC and transferrin saturation. This is most consistent with anemia of chronic disease, secondary to her CAD and underlying malignancy. -I encourage her to take oral iron supplement   3. CKD stage III -Her GFR is in 40-50's  -No  lab signs of tumor lysis -Possibly related to her underlying hypertension and diuretics  -Her creatinine is 1.7 today, worse than before, I recommend her to stop Lasix -Avoid nephrotoxins, such as NSAIDs and IV CT contrast  4. HTN, GERD, sleep apnea -She'll continue follow-up with her primary care physician -She takes Lasix 20 mg daily, however her renal function has gotten worse daily, creatinine 1.7 today, I recommend her to hold lasix   5. Leg swelling and pain  -Symmetric, mainly in her feet and ankles, likely related to her hypoalbuminemia -I recommend her to elevate her legs when she sits or laying down -near resolved after lasix  -Due to her worsening kidney function, I'll hold Lasix for now  PLAN -Continue weekly Taxol -I'll see her back in 2 weeks -Hold lasix for now, monitor her renal function closely   I spent 20 minutes counseling the patient face to face. The total time spent in the appointment was 25 minute s and more than 50% was on counseling.     Truitt Merle, MD 02/13/2016

## 2016-02-15 ENCOUNTER — Telehealth: Payer: Self-pay | Admitting: *Deleted

## 2016-02-15 NOTE — Telephone Encounter (Signed)
Notified pt of Wig Voucher that she can use to get a $25 gift card to help with a wig.  Informed that she can p/u next visit. I will keep at my desk.

## 2016-02-18 ENCOUNTER — Telehealth: Payer: Self-pay | Admitting: Hematology

## 2016-02-18 NOTE — Telephone Encounter (Signed)
Added additional appointments for September. Not able to reach patient by phone - patient to get new scheduled at next visit 9/5.

## 2016-02-20 ENCOUNTER — Other Ambulatory Visit (HOSPITAL_BASED_OUTPATIENT_CLINIC_OR_DEPARTMENT_OTHER): Payer: Commercial Managed Care - HMO

## 2016-02-20 ENCOUNTER — Ambulatory Visit (HOSPITAL_BASED_OUTPATIENT_CLINIC_OR_DEPARTMENT_OTHER): Payer: Commercial Managed Care - HMO

## 2016-02-20 ENCOUNTER — Other Ambulatory Visit: Payer: Self-pay | Admitting: Hematology

## 2016-02-20 VITALS — BP 147/83 | HR 70 | Temp 97.9°F | Resp 17

## 2016-02-20 DIAGNOSIS — Z5111 Encounter for antineoplastic chemotherapy: Secondary | ICD-10-CM | POA: Diagnosis not present

## 2016-02-20 DIAGNOSIS — C778 Secondary and unspecified malignant neoplasm of lymph nodes of multiple regions: Secondary | ICD-10-CM

## 2016-02-20 DIAGNOSIS — C50911 Malignant neoplasm of unspecified site of right female breast: Secondary | ICD-10-CM

## 2016-02-20 LAB — COMPREHENSIVE METABOLIC PANEL
ALT: 13 U/L (ref 0–55)
ANION GAP: 11 meq/L (ref 3–11)
AST: 16 U/L (ref 5–34)
Albumin: 3.7 g/dL (ref 3.5–5.0)
Alkaline Phosphatase: 49 U/L (ref 40–150)
BUN: 36.6 mg/dL — ABNORMAL HIGH (ref 7.0–26.0)
CALCIUM: 9.3 mg/dL (ref 8.4–10.4)
CHLORIDE: 106 meq/L (ref 98–109)
CO2: 23 mEq/L (ref 22–29)
CREATININE: 1.6 mg/dL — AB (ref 0.6–1.1)
EGFR: 39 mL/min/{1.73_m2} — AB (ref >90)
Glucose: 99 mg/dl (ref 70–140)
POTASSIUM: 4.2 meq/L (ref 3.5–5.1)
Sodium: 139 mEq/L (ref 136–145)
Total Bilirubin: 0.35 mg/dL (ref 0.20–1.20)
Total Protein: 8.8 g/dL — ABNORMAL HIGH (ref 6.4–8.3)

## 2016-02-20 LAB — CBC WITH DIFFERENTIAL/PLATELET
BASO%: 0.4 % (ref 0.0–2.0)
BASOS ABS: 0 10*3/uL (ref 0.0–0.1)
EOS ABS: 0.1 10*3/uL (ref 0.0–0.5)
EOS%: 3.5 % (ref 0.0–7.0)
HCT: 31.5 % — ABNORMAL LOW (ref 34.8–46.6)
HEMOGLOBIN: 10.1 g/dL — AB (ref 11.6–15.9)
LYMPH%: 19.5 % (ref 14.0–49.7)
MCH: 25.7 pg (ref 25.1–34.0)
MCHC: 31.9 g/dL (ref 31.5–36.0)
MCV: 80.7 fL (ref 79.5–101.0)
MONO#: 0.3 10*3/uL (ref 0.1–0.9)
MONO%: 6.9 % (ref 0.0–14.0)
NEUT%: 69.7 % (ref 38.4–76.8)
NEUTROS ABS: 2.7 10*3/uL (ref 1.5–6.5)
PLATELETS: 339 10*3/uL (ref 145–400)
RBC: 3.91 10*6/uL (ref 3.70–5.45)
RDW: 18.4 % — ABNORMAL HIGH (ref 11.2–14.5)
WBC: 3.9 10*3/uL (ref 3.9–10.3)
lymph#: 0.8 10*3/uL — ABNORMAL LOW (ref 0.9–3.3)

## 2016-02-20 LAB — TECHNOLOGIST REVIEW

## 2016-02-20 MED ORDER — DIPHENHYDRAMINE HCL 50 MG/ML IJ SOLN
INTRAMUSCULAR | Status: AC
  Filled 2016-02-20: qty 1

## 2016-02-20 MED ORDER — SODIUM CHLORIDE 0.9 % IV SOLN
Freq: Once | INTRAVENOUS | Status: AC
  Administered 2016-02-20: 16:00:00 via INTRAVENOUS

## 2016-02-20 MED ORDER — DIPHENHYDRAMINE HCL 50 MG/ML IJ SOLN
25.0000 mg | Freq: Once | INTRAMUSCULAR | Status: AC
  Administered 2016-02-20: 25 mg via INTRAVENOUS

## 2016-02-20 MED ORDER — FAMOTIDINE IN NACL 20-0.9 MG/50ML-% IV SOLN
INTRAVENOUS | Status: AC
  Filled 2016-02-20: qty 50

## 2016-02-20 MED ORDER — SODIUM CHLORIDE 0.9 % IV SOLN
10.0000 mg | Freq: Once | INTRAVENOUS | Status: AC
  Administered 2016-02-20: 10 mg via INTRAVENOUS
  Filled 2016-02-20: qty 1

## 2016-02-20 MED ORDER — FAMOTIDINE IN NACL 20-0.9 MG/50ML-% IV SOLN
20.0000 mg | Freq: Once | INTRAVENOUS | Status: AC
  Administered 2016-02-20: 20 mg via INTRAVENOUS

## 2016-02-20 MED ORDER — SODIUM CHLORIDE 0.9 % IV SOLN
80.0000 mg/m2 | Freq: Once | INTRAVENOUS | Status: AC
  Administered 2016-02-20: 156 mg via INTRAVENOUS
  Filled 2016-02-20: qty 26

## 2016-02-20 NOTE — Progress Notes (Signed)
Dr. [name] reviewed lab results today.  OK to treat with Crea level of 1.6 as per md.  Pt to receive extra 500 ml of normal saline today.

## 2016-02-20 NOTE — Progress Notes (Signed)
Previous CMP orders were from 02/13/2016 and patient had abnormal lab values at that time and was treated.  Spoke with Dr [name], added order for current CMP since Crt was elevated.  Ok to proceed with treatment without waiting on CMP results.  Call Dr. [name] if results are still elevated so she can evaluate future fluid orders.

## 2016-02-20 NOTE — Progress Notes (Signed)
Notified Thu RN about elevated BUN/Crt for notification to Dr. [name]

## 2016-02-20 NOTE — Patient Instructions (Signed)
[facility] Discharge Instructions for Patients Receiving Chemotherapy  Today you received the following chemotherapy agent: Taxol   To help prevent nausea and vomiting after your treatment, we encourage you to take your nausea medication as directed  If you develop nausea and vomiting that is not controlled by your nausea medication, call the clinic.   BELOW ARE SYMPTOMS THAT SHOULD BE REPORTED IMMEDIATELY:  *FEVER GREATER THAN 100.5 F  *CHILLS WITH OR WITHOUT FEVER  NAUSEA AND VOMITING THAT IS NOT CONTROLLED WITH YOUR NAUSEA MEDICATION  *UNUSUAL SHORTNESS OF BREATH  *UNUSUAL BRUISING OR BLEEDING  TENDERNESS IN MOUTH AND THROAT WITH OR WITHOUT PRESENCE OF ULCERS  *URINARY PROBLEMS  *BOWEL PROBLEMS  UNUSUAL RASH Items with * indicate a potential emergency and should be followed up as soon as possible.  Feel free to call the clinic you have any questions or concerns. The clinic phone number is [phone].  

## 2016-02-27 ENCOUNTER — Ambulatory Visit (HOSPITAL_BASED_OUTPATIENT_CLINIC_OR_DEPARTMENT_OTHER): Payer: Commercial Managed Care - HMO | Admitting: Hematology

## 2016-02-27 ENCOUNTER — Ambulatory Visit (HOSPITAL_BASED_OUTPATIENT_CLINIC_OR_DEPARTMENT_OTHER): Payer: Commercial Managed Care - HMO

## 2016-02-27 ENCOUNTER — Encounter: Payer: Self-pay | Admitting: Hematology

## 2016-02-27 ENCOUNTER — Other Ambulatory Visit (HOSPITAL_BASED_OUTPATIENT_CLINIC_OR_DEPARTMENT_OTHER): Payer: Commercial Managed Care - HMO

## 2016-02-27 VITALS — BP 150/70 | HR 74 | Temp 98.1°F | Resp 17 | Ht 64.0 in | Wt 185.6 lb

## 2016-02-27 DIAGNOSIS — C50911 Malignant neoplasm of unspecified site of right female breast: Secondary | ICD-10-CM | POA: Diagnosis not present

## 2016-02-27 DIAGNOSIS — C778 Secondary and unspecified malignant neoplasm of lymph nodes of multiple regions: Secondary | ICD-10-CM | POA: Diagnosis not present

## 2016-02-27 DIAGNOSIS — Z5111 Encounter for antineoplastic chemotherapy: Secondary | ICD-10-CM | POA: Diagnosis not present

## 2016-02-27 DIAGNOSIS — N183 Chronic kidney disease, stage 3 unspecified: Secondary | ICD-10-CM

## 2016-02-27 DIAGNOSIS — G4733 Obstructive sleep apnea (adult) (pediatric): Secondary | ICD-10-CM

## 2016-02-27 DIAGNOSIS — Z171 Estrogen receptor negative status [ER-]: Secondary | ICD-10-CM | POA: Diagnosis not present

## 2016-02-27 DIAGNOSIS — C799 Secondary malignant neoplasm of unspecified site: Secondary | ICD-10-CM | POA: Diagnosis not present

## 2016-02-27 DIAGNOSIS — E669 Obesity, unspecified: Secondary | ICD-10-CM

## 2016-02-27 DIAGNOSIS — D649 Anemia, unspecified: Secondary | ICD-10-CM

## 2016-02-27 DIAGNOSIS — I10 Essential (primary) hypertension: Secondary | ICD-10-CM

## 2016-02-27 DIAGNOSIS — D630 Anemia in neoplastic disease: Secondary | ICD-10-CM

## 2016-02-27 LAB — CBC WITH DIFFERENTIAL/PLATELET
BASO%: 1.8 % (ref 0.0–2.0)
Basophils Absolute: 0.1 10*3/uL (ref 0.0–0.1)
EOS ABS: 0.1 10*3/uL (ref 0.0–0.5)
EOS%: 2.3 % (ref 0.0–7.0)
HCT: 31.4 % — ABNORMAL LOW (ref 34.8–46.6)
HEMOGLOBIN: 9.9 g/dL — AB (ref 11.6–15.9)
LYMPH%: 17.2 % (ref 14.0–49.7)
MCH: 25.8 pg (ref 25.1–34.0)
MCHC: 31.5 g/dL (ref 31.5–36.0)
MCV: 81.9 fL (ref 79.5–101.0)
MONO#: 0.3 10*3/uL (ref 0.1–0.9)
MONO%: 7.6 % (ref 0.0–14.0)
NEUT%: 71.1 % (ref 38.4–76.8)
NEUTROS ABS: 2.8 10*3/uL (ref 1.5–6.5)
Platelets: 273 10*3/uL (ref 145–400)
RBC: 3.83 10*6/uL (ref 3.70–5.45)
RDW: 21 % — AB (ref 11.2–14.5)
WBC: 3.9 10*3/uL (ref 3.9–10.3)
lymph#: 0.7 10*3/uL — ABNORMAL LOW (ref 0.9–3.3)

## 2016-02-27 LAB — COMPREHENSIVE METABOLIC PANEL
ALBUMIN: 3.6 g/dL (ref 3.5–5.0)
ALK PHOS: 50 U/L (ref 40–150)
ALT: 17 U/L (ref 0–55)
AST: 16 U/L (ref 5–34)
Anion Gap: 9 mEq/L (ref 3–11)
BILIRUBIN TOTAL: 0.31 mg/dL (ref 0.20–1.20)
BUN: 25.9 mg/dL (ref 7.0–26.0)
CO2: 22 mEq/L (ref 22–29)
CREATININE: 1.1 mg/dL (ref 0.6–1.1)
Calcium: 9.1 mg/dL (ref 8.4–10.4)
Chloride: 109 mEq/L (ref 98–109)
EGFR: 60 mL/min/{1.73_m2} — ABNORMAL LOW (ref >90)
GLUCOSE: 88 mg/dL (ref 70–140)
Potassium: 4.1 mEq/L (ref 3.5–5.1)
SODIUM: 141 meq/L (ref 136–145)
TOTAL PROTEIN: 8.4 g/dL — AB (ref 6.4–8.3)

## 2016-02-27 LAB — TECHNOLOGIST REVIEW

## 2016-02-27 MED ORDER — SODIUM CHLORIDE 0.9 % IV SOLN
Freq: Once | INTRAVENOUS | Status: AC
  Administered 2016-02-27: 15:00:00 via INTRAVENOUS

## 2016-02-27 MED ORDER — FAMOTIDINE IN NACL 20-0.9 MG/50ML-% IV SOLN
INTRAVENOUS | Status: AC
  Filled 2016-02-27: qty 50

## 2016-02-27 MED ORDER — FAMOTIDINE IN NACL 20-0.9 MG/50ML-% IV SOLN
20.0000 mg | Freq: Once | INTRAVENOUS | Status: AC
  Administered 2016-02-27: 20 mg via INTRAVENOUS

## 2016-02-27 MED ORDER — DIPHENHYDRAMINE HCL 50 MG/ML IJ SOLN
INTRAMUSCULAR | Status: AC
  Filled 2016-02-27: qty 1

## 2016-02-27 MED ORDER — DIPHENHYDRAMINE HCL 50 MG/ML IJ SOLN
25.0000 mg | Freq: Once | INTRAMUSCULAR | Status: AC
  Administered 2016-02-27: 25 mg via INTRAVENOUS

## 2016-02-27 MED ORDER — PACLITAXEL CHEMO INJECTION 300 MG/50ML
80.0000 mg/m2 | Freq: Once | INTRAVENOUS | Status: AC
  Administered 2016-02-27: 156 mg via INTRAVENOUS
  Filled 2016-02-27: qty 26

## 2016-02-27 MED ORDER — SODIUM CHLORIDE 0.9 % IV SOLN
10.0000 mg | Freq: Once | INTRAVENOUS | Status: AC
  Administered 2016-02-27: 10 mg via INTRAVENOUS
  Filled 2016-02-27: qty 1

## 2016-02-27 NOTE — Progress Notes (Signed)
[place]  [phone] [phone]  Clinic Follow Up Note   Patient Care Team: [name], MD as PCP - General (Internal Medicine) 02/27/2016   CHIEF COMPLAINTS:  Follow up right breast cancer   HISTORY OF PRESENTING ILLNESS (12/14/2015) :  [name] 70 y.o. 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 She is accompanied by her husband to the clinic today.  She noticed a lump at right axilla in 08/2015, no pain or other complains, she also report right breast swelling, no pain, skin erythema, nipple discharge or other complain. She otherwise feels well. She was initially seen at urgent care in March, then was referred to establish her care with prior care physician at [facility]. Her screening mammogram was negative in January 2017. She subsequently underwent CT chest on 09/27/2015 which showed enlarged and inflamed right breast, bulky right axillary lymph nodes. She was referred to breast Center for diagnostic mammogram and ultrasound of right breast, which was negative. She underwent ultrasound guided right axillary node biopsy on 12/05/2015, which reviewed port differentiated carcinoma. ER weakly focally positive.  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 She works independently. She lives with her husband. She denies any fever, night sweats, chills, or skin itchiness.  CURRENT THERAPY: chemotherapy with weekly Taxol, started on 01/10/2016, second cycle was delayed due to pt's non-compliance   INTERIM HISTORY:  [name] for follow up. She is accompanied by her husband to  clinic today. She has been tolerating chemotherapy very well, with mild fatigue for a few days after chemotherapy, no significant nausea, diarrhea, neuropathy, or other complaints. She has been eating very well, actually eats more than usual, and she has gained 7 pounds in the past 2 weeks. She has been drinking water adequately. No other complaints.  MEDICAL HISTORY:  Past Medical History:  Diagnosis Date  . Arthritis   . GERD (gastroesophageal reflux disease)   . Hypertension   . Shortness of breath   . Sleep apnea   . Stroke ([facility]     SURGICAL HISTORY: Past Surgical History:  Procedure Laterality Date  . NO PAST SURGERIES      SOCIAL HISTORY: Social History   Social History  . Marital status: Married    Spouse name: N/A  . Number of children: 4  . Years of education: 8 TH   Occupational History  . Not on file.   Social History Main Topics  . Smoking status: Former Smoker    Packs/day: 0.50    Years: 1.00    Types: Cigarettes    Start date: 06/17/1968    Quit date: 06/17/1973  . Smokeless tobacco: Never Used     Comment: Quit in 19s . Alcohol use No     Comment: former drinker for approximately 10 years  . Drug use: No  . Sexual activity: Not on file   Other Topics Concern  . Not on file   Social History Narrative   Patient is married with 3 children still living and 1 deceased son.   Patient is right  handed.   Patient has 8 th grade education.   Patient drinks 2-3 servings daily.    FAMILY HISTORY: Family History  Problem Relation Age of Onset  . Heart disease Mother   . Cancer Father 23    throat cancer  . Cancer Cousin     breast cancer    ALLERGIES:  has No Known Allergies.  MEDICATIONS:  Current Outpatient Prescriptions  Medication Sig Dispense Refill  . amLODipine (NORVASC) 10 MG tablet Take 1 tablet (10 mg total) by mouth daily. 90 tablet 3  . aspirin EC 81 MG tablet Take 1 tablet (81 mg total) by mouth daily. 90 tablet 3  . atorvastatin  (LIPITOR) 20 MG tablet Take 1 tablet (20 mg total) by mouth daily. 90 tablet 3  . furosemide (LASIX) 20 MG tablet 20 mg daily.     . ondansetron (ZOFRAN) 8 MG tablet Take 1 tablet (8 mg total) by mouth 2 (two) times daily as needed (Nausea or vomiting). 30 tablet 1  . prochlorperazine (COMPAZINE) 10 MG tablet Take 1 tablet (10 mg total) by mouth every 6 (six) hours as needed (Nausea or vomiting). 30 tablet 1  . traMADol (ULTRAM) 50 MG tablet Take 1 tablet (50 mg total) by mouth every 8 (eight) hours as needed. 30 tablet 0   No current facility-administered medications for this visit.        REVIEW OF SYSTEMS:   Constitutional: Denies fevers, chills or abnormal night sweats Eyes: Denies blurriness of vision, double vision or watery eyes Ears, nose, mouth, throat, and face: Denies mucositis or sore throat Respiratory: Denies cough, dyspnea or wheezes Cardiovascular: Denies palpitation, chest discomfort or lower extremity swelling Gastrointestinal:  Denies nausea, heartburn or change in bowel habits Skin: Denies abnormal skin rashes Lymphatics: Denies new lymphadenopathy or easy bruising Neurological:Denies numbness, tingling or new weaknesses Behavioral/Psych: Mood is stable, no new changes  All other systems were reviewed with the patient and are negative.  PHYSICAL EXAMINATION: ECOG PERFORMANCE STATUS: 1  Vitals:   02/27/16 1309  BP: (!) 150/70  Pulse: 74  Resp: 17  Temp: 98.1 F (36.7 C)   Filed Weights   02/27/16 1309  Weight: 185 lb 9.6 oz (84.2 kg)    GENERAL:alert, no distress and comfortable SKIN: skin color, texture, turgor are normal, no rashes or significant lesions EYES: normal, conjunctiva are pink and non-injected, sclera clear OROPHARYNX:no exudate, no erythema and lips, buccal mucosa, and tongue normal  NECK: supple, thyroid normal size, non-tender, without nodularity LYMPH:  no palpable lymphadenopathy in the cervical, axillary or inguinal LUNGS: clear to  auscultation and percussion with normal breathing effort HEART: regular rate & rhythm and no murmurs and no lower extremity edema ABDOMEN:abdomen soft, non-tender and normal bowel sounds Musculoskeletal:no cyanosis of digits and no clubbing  PSYCH: alert & oriented x 3 with fluent speech NEURO: no focal motor/sensory deficits Breasts: Breast inspection showed them to be symmetrical with no nipple discharge. (+) Lymphedema of right breast, no significant skin erythema, palpation of the breasts showed no mass, there is a 3.0x3.5cm (initially 6X6.5cm) mass at the anterior right axilla, close to right chest wall of breast margin, no tenderness, left axilla was negative.   LABORATORY DATA:  I have reviewed the data as listed CBC Latest Ref Rng & Units 02/27/2016 02/20/2016 02/13/2016  WBC 3.9 - 10.3 10e3/uL 3.9 3.9 3.9  Hemoglobin 11.6 - 15.9 g/dL 9.9(L) 10.1(L) 9.6(L)  Hematocrit 34.8 - 46.6 % 31.4(L) 31.5(L) 30.3(L)  Platelets 145 -  400 10e3/uL 273 339 302   CMP Latest Ref Rng & Units 02/27/2016 02/20/2016 02/13/2016  Glucose 70 - 140 mg/dl 88 99 97  BUN 7.0 - 26.0 mg/dL 25.9 36.6(H) 35.8(H)  Creatinine 0.6 - 1.1 mg/dL 1.1 1.6(H) 1.7(H)  Sodium 136 - 145 mEq/L 141 139 140  Potassium 3.5 - 5.1 mEq/L 4.1 4.2 4.3  Chloride 96 - 106 mmol/L - - -  CO2 22 - 29 mEq/L 22 23 23   Calcium 8.4 - 10.4 mg/dL 9.1 9.3 9.4  Total Protein 6.4 - 8.3 g/dL 8.4(H) 8.8(H) 8.9(H)  Total Bilirubin 0.20 - 1.20 mg/dL 0.31 0.35 0.37  Alkaline Phos 40 - 150 U/L 50 49 50  AST 5 - 34 U/L 16 16 18   ALT 0 - 55 U/L 17 13 18     PATHOLOGY REPORT: Diagnosis 11/30/2015 Lymph node, needle/core biopsy, right breast/axilla - LYMPH NODE POSITIVE FOR METASTATIC POORLY DIFFERENTIATED CARCINOMA. - SEE COMMENT. Microscopic Comment Immunohistochemical stains are performed. The tumor is positive for cytokeratin AE1/AE3, cytokeratin 7 and E-cadherin. CDX-2 demonstrates weak positivity. Estrogen receptor demonstrates focal very weak to  equivocal staining. Gross cystic disease fluid protein, cytokeratin 20, TTF-1, Melan-A and S100 are all negative. The findings are consistent with a poorly differentiated carcinoma. Given the staining pattern, an upper gastrointestinal and pancreatobiliary primary source should be ruled out. In addition, given the axillary location of the biopsy and focal weak to equivocal estrogen receptor staining, ruling out a poorly differentiated carcinoma of breast primary source is also prudent. Lastly, a gynecologic primary may be considered. Correlation with clinical and radiologic impression is essential. Dr. Vicente Males has seen this case in consultation with essential agreement of the above diagnosis and comment. The findings are called to the [facility] on 12/04/15. (RAH:gt, 12/04/15)  Diagnosis 12/27/2015 Breast, right, needle core biopsy, central - INVASIVE MAMMARY CARCINOMA. - MAMMARY CARCINOMA IN SITU. - LYMPH/VASCULAR INVASION IS IDENTIFIED. - SEE COMMENT. Microscopic Comment An E-cadherin stain will be performed to determine if the carcinoma is ductal or lobular in nature. The results of the stain will be reported in an addendum to follow. Although definitive grading of breast carcinoma is best done on excision, the features of the invasive tumor from the right central breast biopsy are compatible with a grade 3 breast carcinoma. Breast prognostic markers will be performed and reported in an addendum. Findings are called to the [facility] on 12/28/2015. Dr. Lyndon Code has seen this case in consultation with agreement. (RH:kh 12-28-15) ADDENDUM: An E-cadherin immunohistochemical stain is performed which is positive in both the invasive and in situ components confirming the ductal nature of both (i.e. invasive ductal carcinoma with ductal carcinoma in situ). (RAH:gt, 12/29/15) PROGNOSTIC INDICATORS Results: IMMUNOHISTOCHEMICAL AND MORPHOMETRIC ANALYSIS PERFORMED  MANUALLY Estrogen Receptor: 0%, NEGATIVE Progesterone Receptor: 0%, NEGATIVE Proliferation Marker Ki67: 70% Results: HER2 - NEGATIVE RATIO OF HER2/CEP17 SIGNALS 1.13 AVERAGE HER2 COPY NUMBER PER CELL 1.75  Diagnosis 02/07/2016 Lymph node, needle/core biopsy, left axillary - METASTATIC POORLY DIFFERENTIATED CARCINOMA. Results: IMMUNOHISTOCHEMICAL AND MORPHOMETRIC ANALYSIS PERFORMED MANUALLY Estrogen Receptor: 0%, NEGATIVE Progesterone Receptor: 0%, NEGATIVE Results: HER2 - NEGATIVE RATIO OF HER2/CEP17 SIGNALS 1.21 AVERAGE HER2 COPY NUMBER PER CELL 2.05  RADIOGRAPHIC STUDIES: I have personally reviewed the radiological images as listed and agreed with the findings in the report. [place] Extrem Up Left Ltd  Addendum Date: 02/16/2016   ADDENDUM REPORT: 02/16/2016 12:01 ADDENDUM: The following should be added to the report: RECOMMENDATION: Ultrasound-guided core needle biopsy of a pathologic  left axillary lymph node. This was performed subsequently and is reported separately. BI-RADS CATEGORY:  4: Suspicious. Electronically Signed   By: Evangeline Dakin M.D.   On: 02/16/2016 12:01   Result Date: 02/16/2016 CLINICAL DATA:  70 year old with biopsy-proven metastatic carcinoma involving a right axillary lymph node with recent imaging demonstrating what is likely inflammatory breast cancer and also demonstrating pathologic left axillary lymph nodes. EXAM: ULTRASOUND LEFT UPPER EXTREMITY LIMITED TECHNIQUE: Ultrasound examination of the upper extremity soft tissues was performed in the area of clinical concern the left axilla. COMPARISON:  PET-CT 01/03/2016 FINDINGS: Multiple mildly enlarged left axillary lymph nodes which were hypermetabolic on the recent PET-CT. Biopsy of 1 of the lymph nodes has been requested and is performed subsequently and reported separately. IMPRESSION: Multiple mildly enlarged left axillary lymph nodes. Electronically Signed: By: Evangeline Dakin M.D. On: 02/15/2016 09:10   Mm  Diag Breast Tomo Uni Left  Result Date: 02/16/2016 CLINICAL DATA:  70 year old with recent diagnosis of inflammatory right breast cancer and right axillary nodal metastatic disease. Recent PET-CT demonstrated hypermetabolic left axillary lymph nodes. Patient presents today for further evaluation of the nodes and diagnostic left mammogram. EXAM: 2D DIGITAL DIAGNOSTIC UNILATERAL LEFT MAMMOGRAM WITH CAD AND ADJUNCT TOMO COMPARISON:  06/28/2015, 05/24/2014 and earlier. ACR Breast Density Category b: There are scattered areas of fibroglandular density. FINDINGS: Standard 2D and tomosynthesis full field CC and MLO views of the left breast were obtained. Multiple pathologically enlarged lymph nodes with thickened cortices are present in the left axilla on the MLO image. No findings suspicious for malignancy in the left breast proper Mammographic images were processed with CAD. IMPRESSION: 1. No mammographic evidence of malignancy involving the left breast. 2. Pathologic left axillary lymph nodes. RECOMMENDATION: 1. Left axillary ultrasound and biopsy of a pathologic left axillary lymph node which was performed subsequently and is reported separately. 2. Treatment plan for the biopsy-proven right breast cancer. I have discussed the findings and recommendations with the patient. Results were also provided in writing at the conclusion of the visit. BI-RADS CATEGORY  4: Suspicious. (BI-RADS category chosen for the pathologic left axillary lymph nodes). Electronically Signed   By: Evangeline Dakin M.D.   On: 02/16/2016 12:08   [place] Lt Breast Bx W Loc Dev 1st Lesion Img Bx Spec US Guide  Addendum Date: 02/08/2016   ADDENDUM REPORT: 02/08/2016 12:57 ADDENDUM: Pathology revealed METASTATIC POORLY DIFFERENTIATED CARCINOMA of the Left axillary lymph node. This was found to be concordant by Dr. Peggye Fothergill. Pathology results were discussed with the patient by telephone. The patient reported doing well after the biopsy with  tenderness at the site. Post biopsy instructions and care were reviewed and questions were answered. The patient was encouraged to call [facility] for any additional concerns. The patient has a recent diagnosis of right breast cancer and should follow her outlined treatment plan. The patient has an appointment with Dr. Truitt Merle at [facility] on February 13, 2016. Pathology results reported by Terie Purser, RN on 02/08/2016. Electronically Signed   By: Evangeline Dakin M.D.   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 Sampling of a left axillary node is requested. EXAM: ULTRASOUND GUIDED CORE NEEDLE BIOPSY OF A LEFT AXILLARY NODE COMPARISON:  Previous exam(s). FINDINGS: I met with the  patient and we discussed the procedure of ultrasound-guided biopsy, including benefits and alternatives. We discussed the high likelihood of a successful procedure. We discussed the risks of the procedure, including infection, bleeding, tissue injury, clip migration, and inadequate sampling. Informed written consent was given. The usual time-out protocol was performed immediately prior to the procedure. Using sterile technique with chlorhexidine and skin antisepsis, 1% Lidocaine as local anesthetic, under direct ultrasound visualization, a 14 gauge Bard Marquee core needle device was used to perform biopsy of a pathologic left axillary lymph node using an inferolateral approach. At the conclusion of the procedure a Hydromark spiral shaped tissue marker clip was deployed into the biopsy cavity. IMPRESSION: Ultrasound guided biopsy of a pathologic left axillary lymph node. No apparent complications. Electronically Signed: By: Evangeline Dakin M.D. On:  02/07/2016 15:39   DIAGNOSTIC MAMMOGRAM OF RIGHT BREAST 12/27/2015 FINDINGS: CC and MLO views of the right breast were performed with tomosynthesis. The right breast demonstrates diffuse skin thickening and increased density. There is right axillary adenopathy. No focal mass is identified within the breast.  Mammographic images were processed with CAD.  On physical exam, there is palpable right axillary adenopathy including a 5 cm right axillary lymph node. The right breast and nipple are asymmetrically enlarged relative to the left.  Targeted ultrasound is performed, showing numerous enlarged right axillary lymph nodes, the largest measuring 5.3 x 4.2 x 2.6 cm.  IMPRESSION: Suspicious right axillary mass.  Ct CHEST WITH CONTRAST 09/27/2015 IMPRESSION: 1. 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 The right breast is enlarged with increased soft tissue attenuation, skin thickening and possible nipple retraction. There are bulky right axillary lymph nodes with smaller right supraclavicular lymph nodes. 2. No other evidence of metastatic disease. 3. Mild cardiomegaly. 4. No acute findings in the lungs.   ASSESSMENT & PLAN:  70 year old female, with past medical history of HTN, GERD, sleep apnea, presented with bulky right axillary adenopathy.  1. 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 Unfortunately her cancer has metastasized to left axilla and cervical lymph nodes, which are distant metastasis.  -I discussed her  left axillary node biopsy, which confirmed metastasis  -We reviewed the natural history of metastatic triple negative breast cancer, which is very aggressive, and her cancer is incurable at this stage  -Giving the metastatic disease, surgery up front is not indicated, I recommend systemic chemotherapy. -She has started weekly Taxol, tolerating well, no neuropathy or other side effects so far, we will continue -Her right axillary lymph node has gotten much smaller cysts she started chemotherapy, corresponding to good clinical response -I plan to repeat her CT or PET scan after 2-3 months of treatment. -She has difficulty with her vein acces, I strongly encouraged her to consider port placement. She will think about it  2. ANEMIA  -She developed normocytic mild anemia in the past few months, hemoglobin 9.9 today  -No clinical signs of bleeding. Possible related to her underlying malignancy -Her anemia workup shows a normal folic acid and G25 level, increased ferritin, decreased serum iron, TIBC and transferrin saturation. This is most  consistent with anemia of chronic disease, secondary to her CAD and underlying malignancy. -I encourage her to take oral iron supplement   3. CKD stage III -Her GFR is in 40-50's  -No lab signs of tumor lysis -Possibly related to her underlying hypertension and diuretics  -Her cranny has much improved since she stopped her Lasix. -Avoid nephrotoxins, such as NSAIDs and IV CT contrast  4. HTN, GERD, sleep apnea -She'll continue follow-up with her primary care physician   PLAN -Continue weekly Taxol -I'll see her back in 2 weeks -She will think about port placement   I spent 20 minutes counseling the patient face to face. The total time spent in the appointment was 25 minute s and more than 50% was on counseling.     Truitt Merle, MD 02/27/2016

## 2016-02-27 NOTE — Patient Instructions (Signed)
[facility] Discharge Instructions for Patients Receiving Chemotherapy  Today you received the following chemotherapy agent: Taxol   To help prevent nausea and vomiting after your treatment, we encourage you to take your nausea medication as directed  If you develop nausea and vomiting that is not controlled by your nausea medication, call the clinic.   BELOW ARE SYMPTOMS THAT SHOULD BE REPORTED IMMEDIATELY:  *FEVER GREATER THAN 100.5 F  *CHILLS WITH OR WITHOUT FEVER  NAUSEA AND VOMITING THAT IS NOT CONTROLLED WITH YOUR NAUSEA MEDICATION  *UNUSUAL SHORTNESS OF BREATH  *UNUSUAL BRUISING OR BLEEDING  TENDERNESS IN MOUTH AND THROAT WITH OR WITHOUT PRESENCE OF ULCERS  *URINARY PROBLEMS  *BOWEL PROBLEMS  UNUSUAL RASH Items with * indicate a potential emergency and should be followed up as soon as possible.  Feel free to call the clinic you have any questions or concerns. The clinic phone number is [phone].  

## 2016-02-28 ENCOUNTER — Encounter: Payer: Self-pay | Admitting: *Deleted

## 2016-02-28 ENCOUNTER — Encounter: Payer: Self-pay | Admitting: Hematology

## 2016-02-28 LAB — CANCER ANTIGEN 27.29: CA 27.29: 96.7 U/mL — ABNORMAL HIGH (ref 0.0–38.6)

## 2016-02-28 NOTE — Progress Notes (Signed)
Patient and daughter came in to discuss Medicaid and how to complete application. Gave them an application and they completed while here. Patient also received another gas card and was given her balance for the grant. Patient asked about getting additional car repairs done. Explained that if she uses a different shop from before, they must provide an invoice and be willing to provide W-9 and wait for the check to be received. Patient and daughter verbalized understanding. They also wanted to know if [employer] would pay for home health services. Advised them I'm not sure but they can contact [employer] and speak with a caseworker who would have information on this. Also advised on the Medicaid application, she may check the box which applies to home health. Patient has my card for any additional financial questions or concerns. Faxed Medicaid application to [place] at [phone]. Fax received ok per confirmation sheet.

## 2016-02-28 NOTE — Progress Notes (Signed)
[place] Work  Clinical Social Work was referred by financial advocate for assessment of psychosocial needs due to possible care needs.  Clinical Social Worker contacted patient at home to offer support and assess for needs. CSW explained differences between [facility] and CNA services. Pt reports she has no need for either of these resources at this time. She denied other issues currently and stated her daughter was asking about these services. CSW encouraged pt to reach out as needed.    Clinical Social Work interventions:  Resource education  Loren Racer, [place] Worker [place]  [place] Phone: [phone] Fax: [phone]

## 2016-03-05 ENCOUNTER — Other Ambulatory Visit (HOSPITAL_BASED_OUTPATIENT_CLINIC_OR_DEPARTMENT_OTHER): Payer: Commercial Managed Care - HMO

## 2016-03-05 ENCOUNTER — Ambulatory Visit (HOSPITAL_BASED_OUTPATIENT_CLINIC_OR_DEPARTMENT_OTHER): Payer: Commercial Managed Care - HMO

## 2016-03-05 ENCOUNTER — Ambulatory Visit: Payer: Commercial Managed Care - HMO | Admitting: Nutrition

## 2016-03-05 VITALS — BP 127/62 | HR 65 | Temp 98.6°F | Resp 17

## 2016-03-05 DIAGNOSIS — Z5111 Encounter for antineoplastic chemotherapy: Secondary | ICD-10-CM

## 2016-03-05 DIAGNOSIS — C778 Secondary and unspecified malignant neoplasm of lymph nodes of multiple regions: Secondary | ICD-10-CM | POA: Diagnosis not present

## 2016-03-05 DIAGNOSIS — C50911 Malignant neoplasm of unspecified site of right female breast: Secondary | ICD-10-CM

## 2016-03-05 LAB — COMPREHENSIVE METABOLIC PANEL
ALBUMIN: 3.7 g/dL (ref 3.5–5.0)
ALK PHOS: 57 U/L (ref 40–150)
ALT: 17 U/L (ref 0–55)
AST: 14 U/L (ref 5–34)
Anion Gap: 10 mEq/L (ref 3–11)
BILIRUBIN TOTAL: 0.45 mg/dL (ref 0.20–1.20)
BUN: 27.5 mg/dL — AB (ref 7.0–26.0)
CALCIUM: 9.3 mg/dL (ref 8.4–10.4)
CO2: 24 mEq/L (ref 22–29)
Chloride: 106 mEq/L (ref 98–109)
Creatinine: 1.2 mg/dL — ABNORMAL HIGH (ref 0.6–1.1)
EGFR: 56 mL/min/{1.73_m2} — AB (ref >90)
GLUCOSE: 88 mg/dL (ref 70–140)
POTASSIUM: 4 meq/L (ref 3.5–5.1)
SODIUM: 139 meq/L (ref 136–145)
TOTAL PROTEIN: 8.4 g/dL — AB (ref 6.4–8.3)

## 2016-03-05 LAB — CBC WITH DIFFERENTIAL/PLATELET
BASO%: 1.6 % (ref 0.0–2.0)
BASOS ABS: 0.1 10*3/uL (ref 0.0–0.1)
EOS ABS: 0.1 10*3/uL (ref 0.0–0.5)
EOS%: 3.2 % (ref 0.0–7.0)
HEMATOCRIT: 33.2 % — AB (ref 34.8–46.6)
HGB: 10.6 g/dL — ABNORMAL LOW (ref 11.6–15.9)
LYMPH#: 0.8 10*3/uL — AB (ref 0.9–3.3)
LYMPH%: 21.1 % (ref 14.0–49.7)
MCH: 26.6 pg (ref 25.1–34.0)
MCHC: 31.9 g/dL (ref 31.5–36.0)
MCV: 83.2 fL (ref 79.5–101.0)
MONO#: 0.3 10*3/uL (ref 0.1–0.9)
MONO%: 8.5 % (ref 0.0–14.0)
NEUT#: 2.5 10*3/uL (ref 1.5–6.5)
NEUT%: 65.6 % (ref 38.4–76.8)
PLATELETS: 287 10*3/uL (ref 145–400)
RBC: 3.99 10*6/uL (ref 3.70–5.45)
RDW: 19.5 % — ABNORMAL HIGH (ref 11.2–14.5)
WBC: 3.8 10*3/uL — ABNORMAL LOW (ref 3.9–10.3)

## 2016-03-05 LAB — TECHNOLOGIST REVIEW

## 2016-03-05 MED ORDER — FAMOTIDINE IN NACL 20-0.9 MG/50ML-% IV SOLN
INTRAVENOUS | Status: AC
  Filled 2016-03-05: qty 50

## 2016-03-05 MED ORDER — DIPHENHYDRAMINE HCL 50 MG/ML IJ SOLN
INTRAMUSCULAR | Status: AC
  Filled 2016-03-05: qty 1

## 2016-03-05 MED ORDER — SODIUM CHLORIDE 0.9 % IV SOLN
10.0000 mg | Freq: Once | INTRAVENOUS | Status: AC
  Administered 2016-03-05: 10 mg via INTRAVENOUS
  Filled 2016-03-05: qty 1

## 2016-03-05 MED ORDER — SODIUM CHLORIDE 0.9 % IV SOLN
80.0000 mg/m2 | Freq: Once | INTRAVENOUS | Status: AC
  Administered 2016-03-05: 156 mg via INTRAVENOUS
  Filled 2016-03-05: qty 26

## 2016-03-05 MED ORDER — FAMOTIDINE IN NACL 20-0.9 MG/50ML-% IV SOLN
20.0000 mg | Freq: Once | INTRAVENOUS | Status: AC
  Administered 2016-03-05: 20 mg via INTRAVENOUS

## 2016-03-05 MED ORDER — SODIUM CHLORIDE 0.9 % IV SOLN
Freq: Once | INTRAVENOUS | Status: AC
  Administered 2016-03-05: 14:00:00 via INTRAVENOUS

## 2016-03-05 MED ORDER — DIPHENHYDRAMINE HCL 50 MG/ML IJ SOLN
25.0000 mg | Freq: Once | INTRAMUSCULAR | Status: AC
  Administered 2016-03-05: 25 mg via INTRAVENOUS

## 2016-03-05 NOTE — Progress Notes (Signed)
70 year old female diagnosed with Breast cancer. She is a patient of Dr. [name].   Past medical history includes stroke, hypertension and GERD.  Medications include Lipitor, Lasix, Zofran, and Compazine.  Labs include Albumin 3.3.  Height: 64 inches. Weight: 185.6 pounds. UBW: 185 pounds. BMI: 31.86.  Patient is requesting oral nutrition supplement samples. Patient denies nutrition impact symptoms.  Reports she gained too much weight and had to cut back portion sizes.  Nutrition Diagnosis: Food and Nutrition Related Knowledge Deficit related to Breast cancer and associated treatments as evidenced by no prior need for nutrition related information.  Intervention: Educated patient to consume adequate calories and protein for weight maintenance.  Provided fact sheet on protein foods. Provided samples of Boost Calorie Control. Questions answered and teach back method used. Contact information given.  Monitoring, Evaluation, Goals: Patient will tolerated adequate calories and protein for weight maintenance.  **Disclaimer: This note was dictated with voice recognition software. Similar sounding words can inadvertently be transcribed and this note may contain transcription errors which may not have been corrected upon publication of note.**

## 2016-03-05 NOTE — Patient Instructions (Signed)
[facility] Discharge Instructions for Patients Receiving Chemotherapy  Today you received the following chemotherapy agents:  Taxol  To help prevent nausea and vomiting after your treatment, we encourage you to take your nausea medication as prescribed.   If you develop nausea and vomiting that is not controlled by your nausea medication, call the clinic.   BELOW ARE SYMPTOMS THAT SHOULD BE REPORTED IMMEDIATELY:  *FEVER GREATER THAN 100.5 F  *CHILLS WITH OR WITHOUT FEVER  NAUSEA AND VOMITING THAT IS NOT CONTROLLED WITH YOUR NAUSEA MEDICATION  *UNUSUAL SHORTNESS OF BREATH  *UNUSUAL BRUISING OR BLEEDING  TENDERNESS IN MOUTH AND THROAT WITH OR WITHOUT PRESENCE OF ULCERS  *URINARY PROBLEMS  *BOWEL PROBLEMS  UNUSUAL RASH Items with * indicate a potential emergency and should be followed up as soon as possible.  Feel free to call the clinic you have any questions or concerns. The clinic phone number is [phone].  Please show the CHEMO ALERT CARD at check-in to the Emergency Department and triage nurse.   

## 2016-03-07 ENCOUNTER — Telehealth: Payer: Self-pay | Admitting: Internal Medicine

## 2016-03-07 NOTE — Telephone Encounter (Signed)
APT. REMINDER CALL, NO ANSWER, NO VOICEMAIL °

## 2016-03-08 ENCOUNTER — Ambulatory Visit: Payer: Commercial Managed Care - HMO

## 2016-03-12 ENCOUNTER — Encounter: Payer: Self-pay | Admitting: Hematology

## 2016-03-12 ENCOUNTER — Ambulatory Visit (HOSPITAL_BASED_OUTPATIENT_CLINIC_OR_DEPARTMENT_OTHER): Payer: Commercial Managed Care - HMO | Admitting: Hematology

## 2016-03-12 ENCOUNTER — Ambulatory Visit (HOSPITAL_BASED_OUTPATIENT_CLINIC_OR_DEPARTMENT_OTHER): Payer: Commercial Managed Care - HMO

## 2016-03-12 ENCOUNTER — Other Ambulatory Visit (HOSPITAL_BASED_OUTPATIENT_CLINIC_OR_DEPARTMENT_OTHER): Payer: Commercial Managed Care - HMO

## 2016-03-12 VITALS — BP 150/68 | HR 68 | Temp 98.9°F | Resp 18 | Ht 64.0 in | Wt 183.3 lb

## 2016-03-12 DIAGNOSIS — C773 Secondary and unspecified malignant neoplasm of axilla and upper limb lymph nodes: Secondary | ICD-10-CM

## 2016-03-12 DIAGNOSIS — D630 Anemia in neoplastic disease: Secondary | ICD-10-CM

## 2016-03-12 DIAGNOSIS — N183 Chronic kidney disease, stage 3 unspecified: Secondary | ICD-10-CM

## 2016-03-12 DIAGNOSIS — E669 Obesity, unspecified: Secondary | ICD-10-CM

## 2016-03-12 DIAGNOSIS — C50911 Malignant neoplasm of unspecified site of right female breast: Secondary | ICD-10-CM

## 2016-03-12 DIAGNOSIS — D649 Anemia, unspecified: Secondary | ICD-10-CM | POA: Diagnosis not present

## 2016-03-12 DIAGNOSIS — Z5111 Encounter for antineoplastic chemotherapy: Secondary | ICD-10-CM | POA: Diagnosis not present

## 2016-03-12 DIAGNOSIS — Z23 Encounter for immunization: Secondary | ICD-10-CM | POA: Diagnosis not present

## 2016-03-12 DIAGNOSIS — Z171 Estrogen receptor negative status [ER-]: Secondary | ICD-10-CM

## 2016-03-12 DIAGNOSIS — G4733 Obstructive sleep apnea (adult) (pediatric): Secondary | ICD-10-CM

## 2016-03-12 DIAGNOSIS — K219 Gastro-esophageal reflux disease without esophagitis: Secondary | ICD-10-CM

## 2016-03-12 DIAGNOSIS — I10 Essential (primary) hypertension: Secondary | ICD-10-CM

## 2016-03-12 LAB — CBC WITH DIFFERENTIAL/PLATELET
BASO%: 2.1 % — AB (ref 0.0–2.0)
Basophils Absolute: 0.1 10*3/uL (ref 0.0–0.1)
EOS%: 3.2 % (ref 0.0–7.0)
Eosinophils Absolute: 0.1 10*3/uL (ref 0.0–0.5)
HEMATOCRIT: 31.8 % — AB (ref 34.8–46.6)
HEMOGLOBIN: 10.1 g/dL — AB (ref 11.6–15.9)
LYMPH#: 0.8 10*3/uL — AB (ref 0.9–3.3)
LYMPH%: 18 % (ref 14.0–49.7)
MCH: 26.4 pg (ref 25.1–34.0)
MCHC: 31.9 g/dL (ref 31.5–36.0)
MCV: 82.9 fL (ref 79.5–101.0)
MONO#: 0.4 10*3/uL (ref 0.1–0.9)
MONO%: 9.1 % (ref 0.0–14.0)
NEUT%: 67.6 % (ref 38.4–76.8)
NEUTROS ABS: 3 10*3/uL (ref 1.5–6.5)
PLATELETS: 302 10*3/uL (ref 145–400)
RBC: 3.84 10*6/uL (ref 3.70–5.45)
RDW: 22.6 % — AB (ref 11.2–14.5)
WBC: 4.4 10*3/uL (ref 3.9–10.3)

## 2016-03-12 LAB — COMPREHENSIVE METABOLIC PANEL
ALBUMIN: 3.7 g/dL (ref 3.5–5.0)
ALK PHOS: 50 U/L (ref 40–150)
ALT: 16 U/L (ref 0–55)
ANION GAP: 9 meq/L (ref 3–11)
AST: 14 U/L (ref 5–34)
BILIRUBIN TOTAL: 0.34 mg/dL (ref 0.20–1.20)
BUN: 24.8 mg/dL (ref 7.0–26.0)
CALCIUM: 9.6 mg/dL (ref 8.4–10.4)
CHLORIDE: 107 meq/L (ref 98–109)
CO2: 23 mEq/L (ref 22–29)
CREATININE: 1 mg/dL (ref 0.6–1.1)
EGFR: 66 mL/min/{1.73_m2} — ABNORMAL LOW (ref >90)
Glucose: 95 mg/dl (ref 70–140)
Potassium: 4.1 mEq/L (ref 3.5–5.1)
Sodium: 140 mEq/L (ref 136–145)
TOTAL PROTEIN: 8.1 g/dL (ref 6.4–8.3)

## 2016-03-12 MED ORDER — FAMOTIDINE IN NACL 20-0.9 MG/50ML-% IV SOLN
20.0000 mg | Freq: Once | INTRAVENOUS | Status: AC
  Administered 2016-03-12: 20 mg via INTRAVENOUS

## 2016-03-12 MED ORDER — DIPHENHYDRAMINE HCL 50 MG/ML IJ SOLN
INTRAMUSCULAR | Status: AC
  Filled 2016-03-12: qty 1

## 2016-03-12 MED ORDER — SODIUM CHLORIDE 0.9 % IV SOLN
Freq: Once | INTRAVENOUS | Status: AC
  Administered 2016-03-12: 15:00:00 via INTRAVENOUS

## 2016-03-12 MED ORDER — DIPHENHYDRAMINE HCL 50 MG/ML IJ SOLN
25.0000 mg | Freq: Once | INTRAMUSCULAR | Status: AC
  Administered 2016-03-12: 25 mg via INTRAVENOUS

## 2016-03-12 MED ORDER — FAMOTIDINE IN NACL 20-0.9 MG/50ML-% IV SOLN
INTRAVENOUS | Status: AC
  Filled 2016-03-12: qty 50

## 2016-03-12 MED ORDER — SODIUM CHLORIDE 0.9 % IV SOLN
10.0000 mg | Freq: Once | INTRAVENOUS | Status: AC
  Administered 2016-03-12: 10 mg via INTRAVENOUS
  Filled 2016-03-12: qty 1

## 2016-03-12 MED ORDER — INFLUENZA VAC SPLIT QUAD 0.5 ML IM SUSY
0.5000 mL | PREFILLED_SYRINGE | Freq: Once | INTRAMUSCULAR | Status: AC
  Administered 2016-03-12: 0.5 mL via INTRAMUSCULAR
  Filled 2016-03-12: qty 0.5

## 2016-03-12 MED ORDER — PACLITAXEL CHEMO INJECTION 300 MG/50ML
80.0000 mg/m2 | Freq: Once | INTRAVENOUS | Status: AC
  Administered 2016-03-12: 156 mg via INTRAVENOUS
  Filled 2016-03-12: qty 26

## 2016-03-12 NOTE — Progress Notes (Signed)
[facility]  Telephone:([phone]) [phone] Fax:([phone]) [phone]  Clinic Follow Up Note   Patient Care Team: [name], MD as PCP - General (Internal Medicine) 03/12/2016   CHIEF COMPLAINTS:  Follow up right breast cancer   HISTORY OF PRESENTING ILLNESS (12/14/2015) :  [name] 70 y.o. 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 She is accompanied by her husband to the clinic today.  She noticed a lump at right axilla in 08/2015, no pain or other complains, she also report right breast swelling, no pain, skin erythema, nipple discharge or other complain. She otherwise feels well. She was initially seen at urgent care in March, then was referred to establish her care with prior care physician at [facility] internal medicine center. Her screening mammogram was negative in January 2017. She subsequently underwent CT chest on 09/27/2015 which showed enlarged and inflamed right breast, bulky right axillary lymph nodes. She was referred to breast Center for diagnostic mammogram and ultrasound of right breast, which was negative. She underwent ultrasound guided right axillary node biopsy on 12/05/2015, which reviewed port differentiated carcinoma. ER weakly focally positive.  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 She works independently. She lives with her husband. She denies any fever, night sweats, chills, or skin itchiness.  CURRENT THERAPY: chemotherapy with weekly Taxol, started on 01/10/2016, second cycle was delayed due to pt's non-compliance   INTERIM HISTORY:  LMikaelreturns for follow up. She is accompanied by her husband to  clinic today. She has been tolerating chemotherapy very well, she had a mild fatigue and a few episodes of lightheadedness after chemotherapy, no other noticeable side effects. She has good appetite and eating well. She denies any significant nausea, diarrhea, neuropathy or other complaints. She overall feels well, functions well at home.  MEDICAL HISTORY:  Past Medical History:  Diagnosis Date  . Arthritis   . GERD (gastroesophageal reflux disease)   . Hypertension   . Shortness of breath   . Sleep apnea   . Stroke ([facility]     SURGICAL HISTORY: Past Surgical History:  Procedure Laterality Date  . NO PAST SURGERIES      SOCIAL HISTORY: Social History   Social History  . Marital status: Married    Spouse name: N/A  . Number of children: 4  . Years of education: 8 TH   Occupational History  . Not on file.   Social History Main Topics  . Smoking status: Former Smoker    Packs/day: 0.50    Years: 1.00    Types: Cigarettes    Start date: 06/17/1968    Quit date: 06/17/1973  . Smokeless tobacco: Never Used     Comment: Quit in 170s . Alcohol use No     Comment: former drinker for approximately 10 years  . Drug use: No  . Sexual activity: Not on file   Other Topics Concern  . Not on file   Social History Narrative   Patient is married with 3 children still living and 1 deceased son.   Patient is right handed.   Patient has  8 th grade education.   Patient drinks 2-3 servings daily.    FAMILY HISTORY: Family History  Problem Relation Age of Onset  . Heart disease Mother   . Cancer Father 45    throat cancer  . Cancer Cousin     breast cancer    ALLERGIES:  has No Known Allergies.  MEDICATIONS:  Current Outpatient Prescriptions  Medication Sig Dispense Refill  . amLODipine (NORVASC) 10 MG tablet Take 1 tablet (10 mg total) by mouth daily. 90 tablet 3  . aspirin EC 81 MG tablet Take 1 tablet (81 mg total) by mouth daily. 90 tablet 3  . atorvastatin (LIPITOR)  20 MG tablet Take 1 tablet (20 mg total) by mouth daily. 90 tablet 3  . furosemide (LASIX) 20 MG tablet 20 mg daily.     . ondansetron (ZOFRAN) 8 MG tablet Take 1 tablet (8 mg total) by mouth 2 (two) times daily as needed (Nausea or vomiting). 30 tablet 1  . prochlorperazine (COMPAZINE) 10 MG tablet Take 1 tablet (10 mg total) by mouth every 6 (six) hours as needed (Nausea or vomiting). 30 tablet 1  . traMADol (ULTRAM) 50 MG tablet Take 1 tablet (50 mg total) by mouth every 8 (eight) hours as needed. 30 tablet 0   No current facility-administered medications for this visit.        REVIEW OF SYSTEMS:   Constitutional: Denies fevers, chills or abnormal night sweats Eyes: Denies blurriness of vision, double vision or watery eyes Ears, nose, mouth, throat, and face: Denies mucositis or sore throat Respiratory: Denies cough, dyspnea or wheezes Cardiovascular: Denies palpitation, chest discomfort or lower extremity swelling Gastrointestinal:  Denies nausea, heartburn or change in bowel habits Skin: Denies abnormal skin rashes Lymphatics: Denies new lymphadenopathy or easy bruising Neurological:Denies numbness, tingling or new weaknesses Behavioral/Psych: Mood is stable, no new changes  All other systems were reviewed with the patient and are negative.  PHYSICAL EXAMINATION: ECOG PERFORMANCE STATUS: 1  Vitals:   03/12/16 1317  BP: (!) 150/68  Pulse: 68  Resp: 18  Temp: 98.9 F (37.2 C)   Filed Weights   03/12/16 1317  Weight: 183 lb 4.8 oz (83.1 kg)    GENERAL:alert, no distress and comfortable SKIN: skin color, texture, turgor are normal, no rashes or significant lesions EYES: normal, conjunctiva are pink and non-injected, sclera clear OROPHARYNX:no exudate, no erythema and lips, buccal mucosa, and tongue normal  NECK: supple, thyroid normal size, non-tender, without nodularity LYMPH:  no palpable lymphadenopathy in the cervical, axillary or inguinal LUNGS: clear to auscultation  and percussion with normal breathing effort HEART: regular rate & rhythm and no murmurs and no lower extremity edema ABDOMEN:abdomen soft, non-tender and normal bowel sounds Musculoskeletal:no cyanosis of digits and no clubbing  PSYCH: alert & oriented x 3 with fluent speech NEURO: no focal motor/sensory deficits Breasts: Breast inspection showed them to be symmetrical with no nipple discharge. (+) Lymphedema of right breast, no significant skin erythema, palpation of the breasts showed no mass, there is a 4.0x3.5cm (initially 6X6.5cm) mass at the anterior right axilla, close to right chest wall of breast margin, no tenderness, left axilla was negative.   LABORATORY DATA:  I have reviewed the data as listed CBC Latest Ref Rng & Units 03/12/2016 03/05/2016 02/27/2016  WBC 3.9 - 10.3 10e3/uL 4.4 3.8(L) 3.9  Hemoglobin 11.6 - 15.9 g/dL 10.1(L) 10.6(L) 9.9(L)  Hematocrit 34.8 - 46.6 % 31.8(L) 33.2(L) 31.4(L)  Platelets 145 - 400 10e3/uL 302 287 273  CMP Latest Ref Rng & Units 03/12/2016 03/05/2016 02/27/2016  Glucose 70 - 140 mg/dl 95 88 88  BUN 7.0 - 26.0 mg/dL 24.8 27.5(H) 25.9  Creatinine 0.6 - 1.1 mg/dL 1.0 1.2(H) 1.1  Sodium 136 - 145 mEq/L 140 139 141  Potassium 3.5 - 5.1 mEq/L 4.1 4.0 4.1  Chloride 96 - 106 mmol/L - - -  CO2 22 - 29 mEq/L 23 24 22   Calcium 8.4 - 10.4 mg/dL 9.6 9.3 9.1  Total Protein 6.4 - 8.3 g/dL 8.1 8.4(H) 8.4(H)  Total Bilirubin 0.20 - 1.20 mg/dL 0.34 0.45 0.31  Alkaline Phos 40 - 150 U/L 50 57 50  AST 5 - 34 U/L 14 14 16   ALT 0 - 55 U/L 16 17 17     PATHOLOGY REPORT: Diagnosis 11/30/2015 Lymph node, needle/core biopsy, right breast/axilla - LYMPH NODE POSITIVE FOR METASTATIC POORLY DIFFERENTIATED CARCINOMA. - SEE COMMENT. Microscopic Comment Immunohistochemical stains are performed. The tumor is positive for cytokeratin AE1/AE3, cytokeratin 7 and E-cadherin. CDX-2 demonstrates weak positivity. Estrogen receptor demonstrates focal very weak to  equivocal staining. Gross cystic disease fluid protein, cytokeratin 20, TTF-1, Melan-A and S100 are all negative. The findings are consistent with a poorly differentiated carcinoma. Given the staining pattern, an upper gastrointestinal and pancreatobiliary primary source should be ruled out. In addition, given the axillary location of the biopsy and focal weak to equivocal estrogen receptor staining, ruling out a poorly differentiated carcinoma of breast primary source is also prudent. Lastly, a gynecologic primary may be considered. Correlation with clinical and radiologic impression is essential. Dr. Vicente Males has seen this case in consultation with essential agreement of the above diagnosis and comment. The findings are called to the [facility] on 12/04/15. (RAH:gt, 12/04/15)  Diagnosis 12/27/2015 Breast, right, needle core biopsy, central - INVASIVE MAMMARY CARCINOMA. - MAMMARY CARCINOMA IN SITU. - LYMPH/VASCULAR INVASION IS IDENTIFIED. - SEE COMMENT. Microscopic Comment An E-cadherin stain will be performed to determine if the carcinoma is ductal or lobular in nature. The results of the stain will be reported in an addendum to follow. Although definitive grading of breast carcinoma is best done on excision, the features of the invasive tumor from the right central breast biopsy are compatible with a grade 3 breast carcinoma. Breast prognostic markers will be performed and reported in an addendum. Findings are called to the [facility] on 12/28/2015. Dr. Lyndon Code has seen this case in consultation with agreement. (RH:kh 12-28-15) ADDENDUM: An E-cadherin immunohistochemical stain is performed which is positive in both the invasive and in situ components confirming the ductal nature of both (i.e. invasive ductal carcinoma with ductal carcinoma in situ). (RAH:gt, 12/29/15) PROGNOSTIC INDICATORS Results: IMMUNOHISTOCHEMICAL AND MORPHOMETRIC ANALYSIS PERFORMED  MANUALLY Estrogen Receptor: 0%, NEGATIVE Progesterone Receptor: 0%, NEGATIVE Proliferation Marker Ki67: 70% Results: HER2 - NEGATIVE RATIO OF HER2/CEP17 SIGNALS 1.13 AVERAGE HER2 COPY NUMBER PER CELL 1.75  Diagnosis 02/07/2016 Lymph node, needle/core biopsy, left axillary - METASTATIC POORLY DIFFERENTIATED CARCINOMA. Results: IMMUNOHISTOCHEMICAL AND MORPHOMETRIC ANALYSIS PERFORMED MANUALLY Estrogen Receptor: 0%, NEGATIVE Progesterone Receptor: 0%, NEGATIVE Results: HER2 - NEGATIVE RATIO OF HER2/CEP17 SIGNALS 1.21 AVERAGE HER2 COPY NUMBER PER CELL 2.05  RADIOGRAPHIC STUDIES: I have personally reviewed the radiological images as listed and agreed with the findings in the report. No results found. DIAGNOSTIC MAMMOGRAM OF RIGHT BREAST 12/27/2015 FINDINGS: CC and MLO views of the right breast were performed with tomosynthesis. The right breast demonstrates diffuse skin thickening and increased density. There is right axillary adenopathy. No focal  mass is identified within the breast.  Mammographic images were processed with CAD.  On physical exam, there is palpable right axillary adenopathy including a 5 cm right axillary lymph node. The right breast and nipple are asymmetrically enlarged relative to the left.  Targeted ultrasound is performed, showing numerous enlarged right axillary lymph nodes, the largest measuring 5.3 x 4.2 x 2.6 cm.  IMPRESSION: Suspicious right axillary mass.  Ct CHEST WITH CONTRAST 09/27/2015 IMPRESSION: 1. 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 The right breast is enlarged with increased soft tissue attenuation, skin thickening and possible nipple retraction. There are bulky right axillary lymph nodes with smaller right supraclavicular lymph nodes. 2. No other evidence of metastatic disease. 3. Mild cardiomegaly. 4. No acute findings in the lungs.   ASSESSMENT & PLAN:  70 year old female, with past medical history of  HTN, GERD, sleep apnea, presented with bulky right axillary adenopathy.  1. 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 Unfortunately her cancer has metastasized to left axilla and cervical lymph nodes, which are distant metastasis.  -I discussed her left axillary node biopsy, which confirmed metastasis  -We reviewed the natural history of metastatic triple negative breast cancer, which is very aggressive, and her cancer is incurable at this stage  -Giving the metastatic disease, surgery up front is not indicated, I recommend systemic chemotherapy. -She has started weekly Taxol, tolerating well, no neuropathy or other side effects so far, we will continue -Her right axillary lymph node has gotten smaller since she started chemotherapy, corresponding to good clinical response -I plan to repeat her CT or PET scan after 2-3 months of treatment. -She has difficulty with her vein acces, I strongly encouraged her to consider port placement. She will think about it, but has not agreed to do so -She wants to take a week off next week, but will come back in 2 weeks to continue chemotherapy  2. ANEMIA  -She developed normocytic mild anemia in the past few months, hemoglobin 9.9 today  -No clinical signs of bleeding. Possible related to her underlying malignancy -Her anemia workup shows a normal folic acid and Y09 level, increased ferritin, decreased serum iron, TIBC and transferrin saturation. This is most consistent with anemia of chronic disease, secondary to her CAD  and underlying malignancy. -I encourage her to take oral iron supplement   3. CKD stage III -Her GFR is in 40-50's  -No lab signs of tumor lysis -Possibly related to her underlying hypertension and diuretics  -Her Cr has much improved since she stopped her Lasix. -Avoid nephrotoxins, such as NSAIDs and IV CT contrast  4. HTN, GERD, sleep apnea -She'll continue follow-up with her primary care physician   PLAN -Continue weekly Taxol, she wants to take next week off, and restarted in 2 weeks -I'll see her back in 3 weeks  I spent 20 minutes counseling the patient face to face. The total time spent in the appointment was 25 minute s and more than 50% was on counseling.     Truitt Merle, MD 03/12/2016

## 2016-03-12 NOTE — Patient Instructions (Addendum)
[facility] Discharge Instructions for Patients Receiving Chemotherapy  Today you received the following chemotherapy agents Taxol  To help prevent nausea and vomiting after your treatment, we encourage you to take your nausea medication    If you develop nausea and vomiting that is not controlled by your nausea medication, call the clinic.   BELOW ARE SYMPTOMS THAT SHOULD BE REPORTED IMMEDIATELY:  *FEVER GREATER THAN 100.5 F  *CHILLS WITH OR WITHOUT FEVER  NAUSEA AND VOMITING THAT IS NOT CONTROLLED WITH YOUR NAUSEA MEDICATION  *UNUSUAL SHORTNESS OF BREATH  *UNUSUAL BRUISING OR BLEEDING  TENDERNESS IN MOUTH AND THROAT WITH OR WITHOUT PRESENCE OF ULCERS  *URINARY PROBLEMS  *BOWEL PROBLEMS  UNUSUAL RASH Items with * indicate a potential emergency and should be followed up as soon as possible.  Feel free to call the clinic you have any questions or concerns. The clinic phone number is [phone].  Please show the [place] at check-in to the Emergency Department and triage nurse.  Influenza Virus Vaccine injection What is this medicine? INFLUENZA VIRUS VACCINE (in floo EN zuh VAHY ruhs vak SEEN) helps to reduce the risk of getting influenza also known as the flu. The vaccine only helps protect you against some strains of the flu. This medicine may be used for other purposes; ask your health care provider or pharmacist if you have questions. What should I tell my health care provider before I take this medicine? They need to know if you have any of these conditions: -bleeding disorder like hemophilia -fever or infection -Guillain-Barre syndrome or other neurological problems -immune system problems -infection with the human immunodeficiency virus (HIV) or AIDS -low blood platelet counts -multiple sclerosis -an unusual or allergic reaction to influenza virus vaccine, latex, other medicines, foods, dyes, or preservatives. Different brands of vaccines  contain different allergens. Some may contain latex or eggs. Talk to your doctor about your allergies to make sure that you get the right vaccine. -pregnant or trying to get pregnant -breast-feeding How should I use this medicine? This vaccine is for injection into a muscle or under the skin. It is given by a health care professional. A copy of Vaccine Information Statements will be given before each vaccination. Read this sheet carefully each time. The sheet may change frequently. Talk to your healthcare provider to see which vaccines are right for you. Some vaccines should not be used in all age groups. Overdosage: If you think you have taken too much of this medicine contact a poison control center or emergency room at once. NOTE: This medicine is only for you. Do not share this medicine with others. What if I miss a dose? This does not apply. What may interact with this medicine? -chemotherapy or radiation therapy -medicines that lower your immune system like etanercept, anakinra, infliximab, and adalimumab -medicines that treat or prevent blood clots like warfarin -phenytoin -steroid medicines like prednisone or cortisone -theophylline -vaccines This list may not describe all possible interactions. Give your health care provider a list of all the medicines, herbs, non-prescription drugs, or dietary supplements you use. Also tell them if you smoke, drink alcohol, or use illegal drugs. Some items may interact with your medicine. What should I watch for while using this medicine? Report any side effects that do not go away within 3 days to your doctor or health care professional. Call your health care provider if any unusual symptoms occur within 6 weeks of receiving this vaccine. You may still catch the  flu, but the illness is not usually as bad. You cannot get the flu from the vaccine. The vaccine will not protect against colds or other illnesses that may cause fever. The vaccine is needed  every year. What side effects may I notice from receiving this medicine? Side effects that you should report to your doctor or health care professional as soon as possible: -allergic reactions like skin rash, itching or hives, swelling of the face, lips, or tongue Side effects that usually do not require medical attention (report to your doctor or health care professional if they continue or are bothersome): -fever -headache -muscle aches and pains -pain, tenderness, redness, or swelling at the injection site -tiredness This list may not describe all possible side effects. Call your doctor for medical advice about side effects. You may report side effects to FDA at 1-800-FDA-1088. Where should I keep my medicine? The vaccine will be given by a health care professional in a clinic, pharmacy, doctor's office, or other health care setting. You will not be given vaccine doses to store at home. NOTE: This sheet is a summary. It may not cover all possible information. If you have questions about this medicine, talk to your doctor, pharmacist, or health care provider.    2016, Elsevier/Gold Standard. (2014-12-23 10:07:28)

## 2016-03-13 ENCOUNTER — Telehealth: Payer: Self-pay | Admitting: *Deleted
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
## 2016-03-14 ENCOUNTER — Telehealth: Payer: Self-pay | Admitting: Hematology

## 2016-03-14 NOTE — Telephone Encounter (Signed)
Faxed records to [facility] [phone]

## 2016-03-15 NOTE — Telephone Encounter (Signed)
Spoke with patient.  Let her know that Dr. [name] is fine with her seeing a chiropractor.  However, she does not need Dr. [name] to do a referral.  Patient appreciated the call back.

## 2016-03-26 ENCOUNTER — Ambulatory Visit (HOSPITAL_BASED_OUTPATIENT_CLINIC_OR_DEPARTMENT_OTHER): Payer: Commercial Managed Care - HMO

## 2016-03-26 ENCOUNTER — Ambulatory Visit (HOSPITAL_BASED_OUTPATIENT_CLINIC_OR_DEPARTMENT_OTHER): Payer: Commercial Managed Care - HMO | Admitting: Hematology

## 2016-03-26 ENCOUNTER — Other Ambulatory Visit (HOSPITAL_BASED_OUTPATIENT_CLINIC_OR_DEPARTMENT_OTHER): Payer: Commercial Managed Care - HMO

## 2016-03-26 ENCOUNTER — Telehealth: Payer: Self-pay | Admitting: Hematology

## 2016-03-26 VITALS — BP 133/64 | HR 80 | Temp 98.3°F | Resp 17 | Ht 64.0 in | Wt 182.8 lb

## 2016-03-26 DIAGNOSIS — C50911 Malignant neoplasm of unspecified site of right female breast: Secondary | ICD-10-CM

## 2016-03-26 DIAGNOSIS — C770 Secondary and unspecified malignant neoplasm of lymph nodes of head, face and neck: Secondary | ICD-10-CM

## 2016-03-26 DIAGNOSIS — Z5111 Encounter for antineoplastic chemotherapy: Secondary | ICD-10-CM

## 2016-03-26 DIAGNOSIS — D649 Anemia, unspecified: Secondary | ICD-10-CM

## 2016-03-26 DIAGNOSIS — K219 Gastro-esophageal reflux disease without esophagitis: Secondary | ICD-10-CM

## 2016-03-26 DIAGNOSIS — I517 Cardiomegaly: Secondary | ICD-10-CM

## 2016-03-26 DIAGNOSIS — C773 Secondary and unspecified malignant neoplasm of axilla and upper limb lymph nodes: Secondary | ICD-10-CM | POA: Diagnosis not present

## 2016-03-26 DIAGNOSIS — I10 Essential (primary) hypertension: Secondary | ICD-10-CM

## 2016-03-26 DIAGNOSIS — N183 Chronic kidney disease, stage 3 unspecified: Secondary | ICD-10-CM

## 2016-03-26 DIAGNOSIS — D630 Anemia in neoplastic disease: Secondary | ICD-10-CM

## 2016-03-26 DIAGNOSIS — C799 Secondary malignant neoplasm of unspecified site: Secondary | ICD-10-CM | POA: Diagnosis not present

## 2016-03-26 LAB — COMPREHENSIVE METABOLIC PANEL
ALT: 10 U/L (ref 0–55)
ANION GAP: 8 meq/L (ref 3–11)
AST: 12 U/L (ref 5–34)
Albumin: 3.6 g/dL (ref 3.5–5.0)
Alkaline Phosphatase: 58 U/L (ref 40–150)
BUN: 23.7 mg/dL (ref 7.0–26.0)
CALCIUM: 9.5 mg/dL (ref 8.4–10.4)
CHLORIDE: 111 meq/L — AB (ref 98–109)
CO2: 23 mEq/L (ref 22–29)
CREATININE: 1.2 mg/dL — AB (ref 0.6–1.1)
EGFR: 52 mL/min/{1.73_m2} — AB (ref >90)
GLUCOSE: 120 mg/dL (ref 70–140)
POTASSIUM: 3.8 meq/L (ref 3.5–5.1)
Sodium: 142 mEq/L (ref 136–145)
Total Bilirubin: 0.43 mg/dL (ref 0.20–1.20)
Total Protein: 8.1 g/dL (ref 6.4–8.3)

## 2016-03-26 LAB — CBC WITH DIFFERENTIAL/PLATELET
BASO%: 0.1 % (ref 0.0–2.0)
Basophils Absolute: 0 10*3/uL (ref 0.0–0.1)
EOS ABS: 0.1 10*3/uL (ref 0.0–0.5)
EOS%: 1.5 % (ref 0.0–7.0)
HCT: 34.2 % — ABNORMAL LOW (ref 34.8–46.6)
HGB: 10.9 g/dL — ABNORMAL LOW (ref 11.6–15.9)
LYMPH%: 13.4 % — AB (ref 14.0–49.7)
MCH: 27.1 pg (ref 25.1–34.0)
MCHC: 31.9 g/dL (ref 31.5–36.0)
MCV: 85.1 fL (ref 79.5–101.0)
MONO#: 0.7 10*3/uL (ref 0.1–0.9)
MONO%: 10.9 % (ref 0.0–14.0)
NEUT%: 74.1 % (ref 38.4–76.8)
NEUTROS ABS: 5 10*3/uL (ref 1.5–6.5)
PLATELETS: 274 10*3/uL (ref 145–400)
RBC: 4.02 10*6/uL (ref 3.70–5.45)
RDW: 21.7 % — ABNORMAL HIGH (ref 11.2–14.5)
WBC: 6.7 10*3/uL (ref 3.9–10.3)
lymph#: 0.9 10*3/uL (ref 0.9–3.3)

## 2016-03-26 MED ORDER — SODIUM CHLORIDE 0.9 % IV SOLN
Freq: Once | INTRAVENOUS | Status: AC
  Administered 2016-03-26: 14:00:00 via INTRAVENOUS

## 2016-03-26 MED ORDER — DIPHENHYDRAMINE HCL 50 MG/ML IJ SOLN
25.0000 mg | Freq: Once | INTRAMUSCULAR | Status: AC
  Administered 2016-03-26: 25 mg via INTRAVENOUS

## 2016-03-26 MED ORDER — FAMOTIDINE IN NACL 20-0.9 MG/50ML-% IV SOLN
INTRAVENOUS | Status: AC
  Filled 2016-03-26: qty 50

## 2016-03-26 MED ORDER — FAMOTIDINE IN NACL 20-0.9 MG/50ML-% IV SOLN
20.0000 mg | Freq: Once | INTRAVENOUS | Status: AC
  Administered 2016-03-26: 20 mg via INTRAVENOUS

## 2016-03-26 MED ORDER — SODIUM CHLORIDE 0.9 % IV SOLN
80.0000 mg/m2 | Freq: Once | INTRAVENOUS | Status: AC
  Administered 2016-03-26: 156 mg via INTRAVENOUS
  Filled 2016-03-26: qty 26

## 2016-03-26 MED ORDER — SODIUM CHLORIDE 0.9 % IV SOLN
10.0000 mg | Freq: Once | INTRAVENOUS | Status: AC
  Administered 2016-03-26: 10 mg via INTRAVENOUS
  Filled 2016-03-26: qty 1

## 2016-03-26 MED ORDER — DIPHENHYDRAMINE HCL 50 MG/ML IJ SOLN
INTRAMUSCULAR | Status: AC
  Filled 2016-03-26: qty 1

## 2016-03-26 NOTE — Progress Notes (Signed)
[name]  [phone] [phone]  Clinic Follow Up Note   Patient Care Team: [name], MD as PCP - General (Internal Medicine) 03/26/2016   CHIEF COMPLAINTS:  Follow up right breast cancer   HISTORY OF PRESENTING ILLNESS (12/14/2015) :  [name] 70 y.o. 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 She is accompanied by her husband to the clinic today.  She noticed a lump at right axilla in 08/2015, no pain or other complains, she also report right breast swelling, no pain, skin erythema, nipple discharge or other complain. She otherwise feels well. She was initially seen at urgent care in March, then was referred to establish her care with prior care physician at [facility] internal medicine center. Her screening mammogram was negative in January 2017. She subsequently underwent CT chest on 09/27/2015 which showed enlarged and inflamed right breast, bulky right axillary lymph nodes. She was referred to breast Center for diagnostic mammogram and ultrasound of right breast, which was negative. She underwent ultrasound guided right axillary node biopsy on 12/05/2015, which reviewed port differentiated carcinoma. ER weakly focally positive.  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 She works independently. She lives with her husband. She denies any fever, night sweats, chills, or skin itchiness.  CURRENT THERAPY: chemotherapy with weekly Taxol, started on 01/10/2016, second cycle was delayed due to pt's non-compliance   INTERIM HISTORY:  [place] follow up. She requested a chemotherapy break  last week, and returns to continue chemotherapy. She feels well overall, no significant side effects from chemotherapy. She is mildly constipated, takes stool softener as needed. No other complaints.   MEDICAL HISTORY:  Past Medical History:  Diagnosis Date  . Arthritis   . GERD (gastroesophageal reflux disease)   . Hypertension   . Shortness of breath   . Sleep apnea   . Stroke ([facility]     SURGICAL HISTORY: Past Surgical History:  Procedure Laterality Date  . NO PAST SURGERIES      SOCIAL HISTORY: Social History   Social History  . Marital status: Married    Spouse name: N/A  . Number of children: 4  . Years of education: 8 TH   Occupational History  . Not on file.   Social History Main Topics  . Smoking status: Former Smoker    Packs/day: 0.50    Years: 1.00    Types: Cigarettes    Start date: 06/17/1968    Quit date: 06/17/1973  . Smokeless tobacco: Never Used     Comment: Quit in 154s . Alcohol use No     Comment: former drinker for approximately 10 years  . Drug use: No  . Sexual activity: Not on file   Other Topics Concern  . Not on file   Social History Narrative   Patient is married with 3 children still living and 1 deceased son.   Patient is right handed.   Patient has 8 th grade education.   Patient drinks 2-3 servings daily.    FAMILY HISTORY: Family History  Problem Relation Age of Onset  .  Heart disease Mother   . Cancer Father 22    throat cancer  . Cancer Cousin     breast cancer    ALLERGIES:  has No Known Allergies.  MEDICATIONS:  Current Outpatient Prescriptions  Medication Sig Dispense Refill  . amLODipine (NORVASC) 10 MG tablet Take 1 tablet (10 mg total) by mouth daily. 90 tablet 3  . aspirin EC 81 MG tablet Take 1 tablet (81 mg total) by mouth daily. 90 tablet 3  . atorvastatin (LIPITOR) 20 MG tablet Take 1 tablet (20 mg total) by mouth daily. 90 tablet 3  . furosemide (LASIX) 20 MG tablet 20 mg daily.     . ondansetron  (ZOFRAN) 8 MG tablet Take 1 tablet (8 mg total) by mouth 2 (two) times daily as needed (Nausea or vomiting). 30 tablet 1  . prochlorperazine (COMPAZINE) 10 MG tablet Take 1 tablet (10 mg total) by mouth every 6 (six) hours as needed (Nausea or vomiting). 30 tablet 1  . traMADol (ULTRAM) 50 MG tablet Take 1 tablet (50 mg total) by mouth every 8 (eight) hours as needed. 30 tablet 0   No current facility-administered medications for this visit.        REVIEW OF SYSTEMS:   Constitutional: Denies fevers, chills or abnormal night sweats Eyes: Denies blurriness of vision, double vision or watery eyes Ears, nose, mouth, throat, and face: Denies mucositis or sore throat Respiratory: Denies cough, dyspnea or wheezes Cardiovascular: Denies palpitation, chest discomfort or lower extremity swelling Gastrointestinal:  Denies nausea, heartburn or change in bowel habits Skin: Denies abnormal skin rashes Lymphatics: Denies new lymphadenopathy or easy bruising Neurological:Denies numbness, tingling or new weaknesses Behavioral/Psych: Mood is stable, no new changes  All other systems were reviewed with the patient and are negative.  PHYSICAL EXAMINATION: ECOG PERFORMANCE STATUS: 1  Vitals:   03/26/16 1204  BP: 133/64  Pulse: 80  Resp: 17  Temp: 98.3 F (36.8 C)   Filed Weights   03/26/16 1204  Weight: 182 lb 12.8 oz (82.9 kg)    GENERAL:alert, no distress and comfortable SKIN: skin color, texture, turgor are normal, no rashes or significant lesions EYES: normal, conjunctiva are pink and non-injected, sclera clear OROPHARYNX:no exudate, no erythema and lips, buccal mucosa, and tongue normal  NECK: supple, thyroid normal size, non-tender, without nodularity LYMPH:  no palpable lymphadenopathy in the cervical, axillary or inguinal LUNGS: clear to auscultation and percussion with normal breathing effort HEART: regular rate & rhythm and no murmurs and no lower extremity edema ABDOMEN:abdomen  soft, non-tender and normal bowel sounds Musculoskeletal:no cyanosis of digits and no clubbing  PSYCH: alert & oriented x 3 with fluent speech NEURO: no focal motor/sensory deficits Breasts: Breast inspection showed them to be symmetrical with no nipple discharge. (+) Lymphedema of right breast, no significant skin erythema, palpation of the breasts showed no mass, there is a 4.0x3.5cm (initially 6X6.5cm) mass at the anterior right axilla, close to right chest wall of breast margin, no tenderness, left axilla was negative.   LABORATORY DATA:  I have reviewed the data as listed CBC Latest Ref Rng & Units 03/26/2016 03/12/2016 03/05/2016  WBC 3.9 - 10.3 10e3/uL 6.7 4.4 3.8(L)  Hemoglobin 11.6 - 15.9 g/dL 10.9(L) 10.1(L) 10.6(L)  Hematocrit 34.8 - 46.6 % 34.2(L) 31.8(L) 33.2(L)  Platelets 145 - 400 10e3/uL 274 302 287   CMP Latest Ref Rng & Units 03/26/2016 03/12/2016 03/05/2016  Glucose 70 - 140 mg/dl 120 95 88  BUN 7.0 - 26.0 mg/dL 23.7  24.8 27.5(H)  Creatinine 0.6 - 1.1 mg/dL 1.2(H) 1.0 1.2(H)  Sodium 136 - 145 mEq/L 142 140 139  Potassium 3.5 - 5.1 mEq/L 3.8 4.1 4.0  Chloride 96 - 106 mmol/L - - -  CO2 22 - 29 mEq/L _0 Calcium 8.4 - 10.4 mg/dL 9.5 9.6 9.3  Total Protein 6.4 - 8.3 g/dL 8.1 8.1 8.4(H)  Total Bilirubin 0.20 - 1.20 mg/dL 0.43 0.34 0.45  Alkaline Phos 40 - 150 U/L 58 50 57  AST 5 - 34 U/L _1 ALT 0 - 55 U/L _2 PATHOLOGY REPORT: Diagnosis 11/30/2015 Lymph node, needle/core biopsy, right breast/axilla - LYMPH NODE POSITIVE FOR METASTATIC POORLY DIFFERENTIATED CARCINOMA. - SEE COMMENT. Microscopic Comment Immunohistochemical stains are performed. The tumor is positive for cytokeratin AE1/AE3, cytokeratin 7 and E-cadherin. CDX-2 demonstrates weak positivity. Estrogen receptor demonstrates focal very weak to equivocal staining. Gross cystic disease fluid protein, cytokeratin 20, TTF-1, Melan-A and S100 are all negative. The findings are consistent with  a poorly differentiated carcinoma. Given the staining pattern, an upper gastrointestinal and pancreatobiliary primary source should be ruled out. In addition, given the axillary location of the biopsy and focal weak to equivocal estrogen receptor staining, ruling out a poorly differentiated carcinoma of breast primary source is also prudent. Lastly, a gynecologic primary may be considered. Correlation with clinical and radiologic impression is essential. Dr. Vicente Males has seen this case in consultation with essential agreement of the above diagnosis and comment. The findings are called to the [place] on 12/04/15. (RAH:gt, 12/04/15)  Diagnosis 12/27/2015 Breast, right, needle core biopsy, central - INVASIVE MAMMARY CARCINOMA. - MAMMARY CARCINOMA IN SITU. - LYMPH/VASCULAR INVASION IS IDENTIFIED. - SEE COMMENT. Microscopic Comment An E-cadherin stain will be performed to determine if the carcinoma is ductal or lobular in nature. The results of the stain will be reported in an addendum to follow. Although definitive grading of breast carcinoma is best done on excision, the features of the invasive tumor from the right central breast biopsy are compatible with a grade 3 breast carcinoma. Breast prognostic markers will be performed and reported in an addendum. Findings are called to the [place] on 12/28/2015. Dr. Lyndon Code has seen this case in consultation with agreement. (RH:kh 12-28-15) ADDENDUM: An E-cadherin immunohistochemical stain is performed which is positive in both the invasive and in situ components confirming the ductal nature of both (i.e. invasive ductal carcinoma with ductal carcinoma in situ). (RAH:gt, 12/29/15) PROGNOSTIC INDICATORS Results: IMMUNOHISTOCHEMICAL AND MORPHOMETRIC ANALYSIS PERFORMED MANUALLY Estrogen Receptor: 0%, NEGATIVE Progesterone Receptor: 0%, NEGATIVE Proliferation Marker Ki67: 70% Results: HER2 - NEGATIVE RATIO OF  HER2/CEP17 SIGNALS 1.13 AVERAGE HER2 COPY NUMBER PER CELL 1.75  Diagnosis 02/07/2016 Lymph node, needle/core biopsy, left axillary - METASTATIC POORLY DIFFERENTIATED CARCINOMA. Results: IMMUNOHISTOCHEMICAL AND MORPHOMETRIC ANALYSIS PERFORMED MANUALLY Estrogen Receptor: 0%, NEGATIVE Progesterone Receptor: 0%, NEGATIVE Results: HER2 - NEGATIVE RATIO OF HER2/CEP17 SIGNALS 1.21 AVERAGE HER2 COPY NUMBER PER CELL 2.05  RADIOGRAPHIC STUDIES: I have personally reviewed the radiological images as listed and agreed with the findings in the report. No results found. DIAGNOSTIC MAMMOGRAM OF RIGHT BREAST 12/27/2015 FINDINGS: CC and MLO views of the right breast were performed with tomosynthesis. The right breast demonstrates diffuse skin thickening and increased density. There is right axillary adenopathy. No focal mass is identified within the breast.  Mammographic images were processed with CAD.  On physical exam, there is palpable right axillary adenopathy including a  5 cm right axillary lymph node. The right breast and nipple are asymmetrically enlarged relative to the left.  Targeted ultrasound is performed, showing numerous enlarged right axillary lymph nodes, the largest measuring 5.3 x 4.2 x 2.6 cm.  IMPRESSION: Suspicious right axillary mass.  Ct CHEST WITH CONTRAST 09/27/2015 IMPRESSION: 1. 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 The right breast is enlarged with increased soft tissue attenuation, skin thickening and possible nipple retraction. There are bulky right axillary lymph nodes with smaller right supraclavicular lymph nodes. 2. No other evidence of metastatic disease. 3. Mild cardiomegaly. 4. No acute findings in the lungs.   ASSESSMENT & PLAN:  70 year old female, with past medical history of HTN, GERD, sleep apnea, presented with bulky right axillary adenopathy.  1. 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 Unfortunately her cancer has metastasized to left axilla and cervical lymph nodes, which are distant metastasis.  -I discussed her left axillary node biopsy, which confirmed metastasis  -We reviewed the natural history of metastatic triple negative breast cancer, which is very aggressive, and her cancer is incurable at this stage  -Giving the metastatic disease, surgery up front is not indicated, I recommend systemic chemotherapy. -She has started weekly Taxol, tolerating well, no neuropathy or other side effects so far, we will continue -Her right axillary lymph node has gotten smaller since she started chemotherapy, corresponding to good clinical response -I plan to repeat her CT or PET scan after 2-3 months of treatment. -She has difficulty with her vein acces, I strongly encouraged her to consider port placement. She will think about it, but has not agreed to do so -She is clinically doing well, lab reviewed, we'll continue chemotherapy  -Plan to repeat restaging CT scans in 4 weeks   2. ANEMIA  -She developed normocytic mild anemia in the past few months, hemoglobin 9.9 today  -No clinical signs of bleeding. Possible related to her underlying malignancy -Her anemia workup shows a normal folic acid and T34 level, increased ferritin, decreased serum iron, TIBC and transferrin saturation. This is most consistent with anemia of chronic disease, secondary to her CAD and underlying malignancy. -I encourage her to take oral iron supplement   3. CKD stage III -Her GFR is in 40-50's    -No lab signs of tumor lysis -Possibly related to her underlying hypertension and diuretics  -Her Cr has much improved since she stopped her Lasix. -Avoid nephrotoxins, such as NSAIDs and IV CT contrast  4. HTN, GERD, sleep apnea -She'll continue follow-up with her primary care physician   PLAN -Continue weekly Taxol -I will see her back in 2 weeks, will order restaging CT on next visit   I spent 20 minutes counseling the patient face to face. The total time spent in the appointment was 25 minute s and more than 50% was on counseling.     Truitt Merle, MD 03/26/2016

## 2016-03-26 NOTE — Patient Instructions (Signed)
[facility] Discharge Instructions for Patients Receiving Chemotherapy  Today you received the following chemotherapy agents:  Taxol  To help prevent nausea and vomiting after your treatment, we encourage you to take your nausea medication as prescribed.   If you develop nausea and vomiting that is not controlled by your nausea medication, call the clinic.   BELOW ARE SYMPTOMS THAT SHOULD BE REPORTED IMMEDIATELY:  *FEVER GREATER THAN 100.5 F  *CHILLS WITH OR WITHOUT FEVER  NAUSEA AND VOMITING THAT IS NOT CONTROLLED WITH YOUR NAUSEA MEDICATION  *UNUSUAL SHORTNESS OF BREATH  *UNUSUAL BRUISING OR BLEEDING  TENDERNESS IN MOUTH AND THROAT WITH OR WITHOUT PRESENCE OF ULCERS  *URINARY PROBLEMS  *BOWEL PROBLEMS  UNUSUAL RASH Items with * indicate a potential emergency and should be followed up as soon as possible.  Feel free to call the clinic you have any questions or concerns. The clinic phone number is [phone].  Please show the CHEMO ALERT CARD at check-in to the Emergency Department and triage nurse.   

## 2016-03-26 NOTE — Telephone Encounter (Signed)
NO 10/10 LOS/ORDERS/REFERRALS

## 2016-03-27 ENCOUNTER — Encounter: Payer: Self-pay | Admitting: Hematology

## 2016-03-27 LAB — CANCER ANTIGEN 27.29: CA 27.29: 64.7 U/mL — ABNORMAL HIGH (ref 0.0–38.6)

## 2016-03-27 NOTE — Progress Notes (Signed)
Received letter addressed to the patient from [place] via mail with my name on it. The letter was requesting information relating to patient and spouse income and assets as well as medical bills and proof of residency. Patient must provide this information and meet a deductible. I pulled itemized statements on HB side and detailed bills from hospital side with a balance as well as a copy of her driver's license to submit. Called patient to advise her that I received the letter and will mail her the letter and documentation that I was able to provide. Advised patient the other information listed, I do not have and she has until 04/01/16 to turn information in to DSS or contact worker and return form indicating she needs more time. Advised patient that I have marked with a highlighter the information she needs to gather and submit to them. I advised that I would put in the mail to her being that her next appointment is not until after the information is due. I placed in outgoing mail along with my card for any additional financial questions or concerns. Patient verbalized understanding.

## 2016-03-31 ENCOUNTER — Encounter: Payer: Self-pay | Admitting: Hematology

## 2016-04-02 ENCOUNTER — Other Ambulatory Visit (HOSPITAL_BASED_OUTPATIENT_CLINIC_OR_DEPARTMENT_OTHER): Payer: Commercial Managed Care - HMO

## 2016-04-02 ENCOUNTER — Ambulatory Visit (HOSPITAL_BASED_OUTPATIENT_CLINIC_OR_DEPARTMENT_OTHER): Payer: Commercial Managed Care - HMO

## 2016-04-02 VITALS — BP 118/55 | HR 17 | Temp 99.1°F

## 2016-04-02 DIAGNOSIS — C773 Secondary and unspecified malignant neoplasm of axilla and upper limb lymph nodes: Secondary | ICD-10-CM | POA: Diagnosis not present

## 2016-04-02 DIAGNOSIS — C50911 Malignant neoplasm of unspecified site of right female breast: Secondary | ICD-10-CM

## 2016-04-02 DIAGNOSIS — Z5111 Encounter for antineoplastic chemotherapy: Secondary | ICD-10-CM | POA: Diagnosis not present

## 2016-04-02 LAB — COMPREHENSIVE METABOLIC PANEL
ALBUMIN: 3.6 g/dL (ref 3.5–5.0)
ALK PHOS: 56 U/L (ref 40–150)
ALT: 11 U/L (ref 0–55)
AST: 15 U/L (ref 5–34)
Anion Gap: 10 mEq/L (ref 3–11)
BUN: 24.3 mg/dL (ref 7.0–26.0)
CALCIUM: 9.4 mg/dL (ref 8.4–10.4)
CHLORIDE: 108 meq/L (ref 98–109)
CO2: 23 mEq/L (ref 22–29)
CREATININE: 1.1 mg/dL (ref 0.6–1.1)
EGFR: 59 mL/min/{1.73_m2} — ABNORMAL LOW (ref >90)
GLUCOSE: 115 mg/dL (ref 70–140)
POTASSIUM: 3.9 meq/L (ref 3.5–5.1)
SODIUM: 141 meq/L (ref 136–145)
Total Bilirubin: 0.28 mg/dL (ref 0.20–1.20)
Total Protein: 8.1 g/dL (ref 6.4–8.3)

## 2016-04-02 LAB — CBC WITH DIFFERENTIAL/PLATELET
BASO%: 1.8 % (ref 0.0–2.0)
Basophils Absolute: 0.1 10*3/uL (ref 0.0–0.1)
EOS%: 3.8 % (ref 0.0–7.0)
Eosinophils Absolute: 0.2 10*3/uL (ref 0.0–0.5)
HEMATOCRIT: 34.1 % — AB (ref 34.8–46.6)
HEMOGLOBIN: 11 g/dL — AB (ref 11.6–15.9)
LYMPH#: 0.9 10*3/uL (ref 0.9–3.3)
LYMPH%: 21.6 % (ref 14.0–49.7)
MCH: 27 pg (ref 25.1–34.0)
MCHC: 32.3 g/dL (ref 31.5–36.0)
MCV: 83.6 fL (ref 79.5–101.0)
MONO#: 0.3 10*3/uL (ref 0.1–0.9)
MONO%: 6.5 % (ref 0.0–14.0)
NEUT%: 66.3 % (ref 38.4–76.8)
NEUTROS ABS: 2.7 10*3/uL (ref 1.5–6.5)
NRBC: 0 % (ref 0–0)
Platelets: 317 10*3/uL (ref 145–400)
RBC: 4.08 10*6/uL (ref 3.70–5.45)
RDW: 19.1 % — AB (ref 11.2–14.5)
WBC: 4 10*3/uL (ref 3.9–10.3)

## 2016-04-02 MED ORDER — FAMOTIDINE IN NACL 20-0.9 MG/50ML-% IV SOLN
20.0000 mg | Freq: Once | INTRAVENOUS | Status: AC
  Administered 2016-04-02: 20 mg via INTRAVENOUS

## 2016-04-02 MED ORDER — DEXAMETHASONE SODIUM PHOSPHATE 10 MG/ML IJ SOLN
INTRAMUSCULAR | Status: AC
  Filled 2016-04-02: qty 1

## 2016-04-02 MED ORDER — FAMOTIDINE IN NACL 20-0.9 MG/50ML-% IV SOLN
INTRAVENOUS | Status: AC
  Filled 2016-04-02: qty 50

## 2016-04-02 MED ORDER — SODIUM CHLORIDE 0.9 % IV SOLN
80.0000 mg/m2 | Freq: Once | INTRAVENOUS | Status: AC
  Administered 2016-04-02: 156 mg via INTRAVENOUS
  Filled 2016-04-02: qty 26

## 2016-04-02 MED ORDER — DEXAMETHASONE SODIUM PHOSPHATE 10 MG/ML IJ SOLN
10.0000 mg | Freq: Once | INTRAMUSCULAR | Status: AC
  Administered 2016-04-02: 10 mg via INTRAVENOUS

## 2016-04-02 MED ORDER — SODIUM CHLORIDE 0.9 % IV SOLN
Freq: Once | INTRAVENOUS | Status: AC
  Administered 2016-04-02: 15:00:00 via INTRAVENOUS

## 2016-04-02 MED ORDER — DIPHENHYDRAMINE HCL 50 MG/ML IJ SOLN
INTRAMUSCULAR | Status: AC
  Filled 2016-04-02: qty 1

## 2016-04-02 MED ORDER — DIPHENHYDRAMINE HCL 50 MG/ML IJ SOLN
25.0000 mg | Freq: Once | INTRAMUSCULAR | Status: AC
  Administered 2016-04-02: 25 mg via INTRAVENOUS

## 2016-04-02 MED ORDER — SODIUM CHLORIDE 0.9 % IV SOLN: 10.0000 mg | Freq: Once | INTRAVENOUS | Status: DC

## 2016-04-02 NOTE — Patient Instructions (Signed)
[facility] Discharge Instructions for Patients Receiving Chemotherapy  Today you received the following chemotherapy agents:  Taxol  To help prevent nausea and vomiting after your treatment, we encourage you to take your nausea medication as prescribed.   If you develop nausea and vomiting that is not controlled by your nausea medication, call the clinic.   BELOW ARE SYMPTOMS THAT SHOULD BE REPORTED IMMEDIATELY:  *FEVER GREATER THAN 100.5 F  *CHILLS WITH OR WITHOUT FEVER  NAUSEA AND VOMITING THAT IS NOT CONTROLLED WITH YOUR NAUSEA MEDICATION  *UNUSUAL SHORTNESS OF BREATH  *UNUSUAL BRUISING OR BLEEDING  TENDERNESS IN MOUTH AND THROAT WITH OR WITHOUT PRESENCE OF ULCERS  *URINARY PROBLEMS  *BOWEL PROBLEMS  UNUSUAL RASH Items with * indicate a potential emergency and should be followed up as soon as possible.  Feel free to call the clinic you have any questions or concerns. The clinic phone number is [phone].  Please show the CHEMO ALERT CARD at check-in to the Emergency Department and triage nurse.   

## 2016-04-09 ENCOUNTER — Ambulatory Visit (HOSPITAL_BASED_OUTPATIENT_CLINIC_OR_DEPARTMENT_OTHER): Payer: Commercial Managed Care - HMO

## 2016-04-09 ENCOUNTER — Other Ambulatory Visit (HOSPITAL_BASED_OUTPATIENT_CLINIC_OR_DEPARTMENT_OTHER): Payer: Commercial Managed Care - HMO

## 2016-04-09 ENCOUNTER — Ambulatory Visit (HOSPITAL_BASED_OUTPATIENT_CLINIC_OR_DEPARTMENT_OTHER): Payer: Commercial Managed Care - HMO | Admitting: Hematology

## 2016-04-09 VITALS — BP 152/72 | HR 69 | Temp 98.1°F | Resp 17 | Ht 64.0 in | Wt 183.9 lb

## 2016-04-09 DIAGNOSIS — D630 Anemia in neoplastic disease: Secondary | ICD-10-CM | POA: Diagnosis not present

## 2016-04-09 DIAGNOSIS — C50911 Malignant neoplasm of unspecified site of right female breast: Secondary | ICD-10-CM

## 2016-04-09 DIAGNOSIS — C773 Secondary and unspecified malignant neoplasm of axilla and upper limb lymph nodes: Secondary | ICD-10-CM

## 2016-04-09 DIAGNOSIS — N183 Chronic kidney disease, stage 3 unspecified: Secondary | ICD-10-CM

## 2016-04-09 DIAGNOSIS — Z5111 Encounter for antineoplastic chemotherapy: Secondary | ICD-10-CM

## 2016-04-09 DIAGNOSIS — C778 Secondary and unspecified malignant neoplasm of lymph nodes of multiple regions: Secondary | ICD-10-CM

## 2016-04-09 DIAGNOSIS — G4733 Obstructive sleep apnea (adult) (pediatric): Secondary | ICD-10-CM

## 2016-04-09 DIAGNOSIS — E669 Obesity, unspecified: Secondary | ICD-10-CM

## 2016-04-09 LAB — CBC WITH DIFFERENTIAL/PLATELET
BASO%: 0.2 % (ref 0.0–2.0)
Basophils Absolute: 0 10*3/uL (ref 0.0–0.1)
EOS%: 3.3 % (ref 0.0–7.0)
Eosinophils Absolute: 0.1 10*3/uL (ref 0.0–0.5)
HEMATOCRIT: 33.9 % — AB (ref 34.8–46.6)
HEMOGLOBIN: 10.9 g/dL — AB (ref 11.6–15.9)
LYMPH#: 0.7 10*3/uL — AB (ref 0.9–3.3)
LYMPH%: 19.5 % (ref 14.0–49.7)
MCH: 27.4 pg (ref 25.1–34.0)
MCHC: 32.3 g/dL (ref 31.5–36.0)
MCV: 84.8 fL (ref 79.5–101.0)
MONO#: 0.3 10*3/uL (ref 0.1–0.9)
MONO%: 7.3 % (ref 0.0–14.0)
NEUT#: 2.6 10*3/uL (ref 1.5–6.5)
NEUT%: 69.7 % (ref 38.4–76.8)
PLATELETS: 262 10*3/uL (ref 145–400)
RBC: 3.99 10*6/uL (ref 3.70–5.45)
RDW: 21.8 % — AB (ref 11.2–14.5)
WBC: 3.7 10*3/uL — ABNORMAL LOW (ref 3.9–10.3)

## 2016-04-09 LAB — COMPREHENSIVE METABOLIC PANEL
ALBUMIN: 3.8 g/dL (ref 3.5–5.0)
ALK PHOS: 55 U/L (ref 40–150)
ALT: 17 U/L (ref 0–55)
ANION GAP: 12 meq/L — AB (ref 3–11)
AST: 15 U/L (ref 5–34)
BILIRUBIN TOTAL: 0.37 mg/dL (ref 0.20–1.20)
BUN: 26.7 mg/dL — AB (ref 7.0–26.0)
CALCIUM: 9.6 mg/dL (ref 8.4–10.4)
CHLORIDE: 109 meq/L (ref 98–109)
CO2: 21 mEq/L — ABNORMAL LOW (ref 22–29)
CREATININE: 1.1 mg/dL (ref 0.6–1.1)
EGFR: 58 mL/min/{1.73_m2} — ABNORMAL LOW (ref >90)
Glucose: 103 mg/dl (ref 70–140)
Potassium: 4 mEq/L (ref 3.5–5.1)
Sodium: 142 mEq/L (ref 136–145)
TOTAL PROTEIN: 8.3 g/dL (ref 6.4–8.3)

## 2016-04-09 MED ORDER — DIPHENHYDRAMINE HCL 50 MG/ML IJ SOLN
INTRAMUSCULAR | Status: AC
Start: 2016-04-09 — End: 2016-04-09
  Filled 2016-04-09: qty 1

## 2016-04-09 MED ORDER — DIPHENHYDRAMINE HCL 50 MG/ML IJ SOLN
25.0000 mg | Freq: Once | INTRAMUSCULAR | Status: AC
  Administered 2016-04-09: 25 mg via INTRAVENOUS

## 2016-04-09 MED ORDER — DEXAMETHASONE SODIUM PHOSPHATE 10 MG/ML IJ SOLN
10.0000 mg | Freq: Once | INTRAMUSCULAR | Status: AC
  Administered 2016-04-09: 10 mg via INTRAVENOUS

## 2016-04-09 MED ORDER — SODIUM CHLORIDE 0.9 % IV SOLN
Freq: Once | INTRAVENOUS | Status: AC
  Administered 2016-04-09: 11:00:00 via INTRAVENOUS

## 2016-04-09 MED ORDER — SODIUM CHLORIDE 0.9 % IV SOLN
80.0000 mg/m2 | Freq: Once | INTRAVENOUS | Status: AC
  Administered 2016-04-09: 156 mg via INTRAVENOUS
  Filled 2016-04-09: qty 26

## 2016-04-09 MED ORDER — FAMOTIDINE IN NACL 20-0.9 MG/50ML-% IV SOLN
INTRAVENOUS | Status: AC
  Filled 2016-04-09: qty 50

## 2016-04-09 MED ORDER — DEXAMETHASONE SODIUM PHOSPHATE 10 MG/ML IJ SOLN
INTRAMUSCULAR | Status: AC
  Filled 2016-04-09: qty 1

## 2016-04-09 MED ORDER — FAMOTIDINE IN NACL 20-0.9 MG/50ML-% IV SOLN
20.0000 mg | Freq: Once | INTRAVENOUS | Status: AC
  Administered 2016-04-09: 20 mg via INTRAVENOUS

## 2016-04-09 NOTE — Progress Notes (Signed)
[place]  [phone] Fax:([phone]) [phone]  Clinic Follow Up Note   Patient Care Team: [name], MD as PCP - General (Internal Medicine) 04/09/2016   CHIEF COMPLAINTS:  Follow up right breast cancer   Oncology History   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 [facility])   Staging form: Breast, AJCC 7th Edition   - Clinical stage from 11/30/2015: Stage IV (T4d, N3c, M1) - Signed by Truitt Merle, MD on 01/05/2016      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 [facility])   11/30/2015 Initial Diagnosis    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 [facility])      11/30/2015 Initial Biopsy    Right axilla lymph node biopsy showed metastatic poorly differentiated adenocarcinoma, IHC positive for CK AE1/AE3, CK7, E-cadherin, CDX-2, and focally very weak to equivocal staining for ER      12/21/2015 Imaging    Bilateral breast MRI showed extensive non-mass enhancement involving the entire right breast, right axillary, supraclavicular, and internal mammary adenopathy, new left axillary adenopathy      12/27/2015 Mammogram    Diagnostic mammogram and ultrasound of right breast and axilla showed diffuse skin thickening and increased density, no focal mass, numerous enlarged right axillary lymph node, the largest measuring 5.3cm.       12/27/2015 Initial Biopsy    Right breast core needle biopsy showed invasive ductal carcinoma, and DCIS, lymphovascular invasion identified      12/27/2015 Receptors her2    ER negative, PR negative, Ki-67 70%, HER-2 negative      01/03/2016 Imaging    PET scan showed diffuse asymmetric right breast hypermetabolic soft tissue density and skin thickening, hypermetabolic metastatic lymphadenopathy in bilateral axillary and subpectoral regions, right supraclavicular region and the right cervical level 5      01/10/2016 -  Chemotherapy    Weekly Taxol 80 mg/m, second cycle was postponed  to 8/22 due to pt's non-compliance       02/07/2016 Pathology Results    Left axillary lymph node biopsy showed metastatic poorly differentiated carcinoma, triple negative.       HISTORY OF PRESENTING ILLNESS (12/14/2015) :  [name] 70 y.o. 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 She is accompanied by her husband to the clinic today.  She noticed a lump at right axilla in 08/2015, no pain or other complains, she also report right breast swelling, no pain, skin erythema, nipple discharge or other complain. She otherwise feels well. She was initially seen at urgent care in March, then was referred to establish her care with prior care physician at [facility] internal medicine center. Her screening mammogram was negative in January 2017. She subsequently underwent CT chest on 09/27/2015 which showed enlarged and inflamed right breast, bulky right axillary lymph nodes. She was referred to breast Center for diagnostic mammogram and ultrasound of right breast, which was negative. She underwent ultrasound guided right axillary node biopsy on 12/05/2015, which reviewed port differentiated carcinoma. ER weakly focally positive.  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 She works independently. She lives with her husband. She denies any fever, night sweats, chills, or skin itchiness.  CURRENT THERAPY: chemotherapy with weekly Taxol, started on 01/10/2016, second cycle was delayed due to pt's non-compliance   INTERIM HISTORY:  [name] returns for follow up. She Has been tolerating chemotherapy well, no significant nausea, diarrhea, or neuropathy. She has  good appetite and energy level, weight is stable.  MEDICAL HISTORY:  Past Medical History:  Diagnosis Date  . Arthritis   . GERD (gastroesophageal reflux disease)   . Hypertension   . Shortness of breath   . Sleep apnea   . Stroke [facility])     SURGICAL HISTORY: Past Surgical History:  Procedure Laterality Date  . NO PAST SURGERIES      SOCIAL HISTORY: Social History   Social History  . Marital status: Married    Spouse name: N/A  . Number of children: 4  . Years of education: 8 TH   Occupational History  . Not on file.   Social History Main Topics  . Smoking status: Former Smoker    Packs/day: 0.50    Years: 1.00    Types: Cigarettes    Start date: 06/17/1968    Quit date: 06/17/1973  . Smokeless tobacco: Never Used     Comment: Quit in 53s  . Alcohol use No     Comment: former drinker for approximately 10 years  . Drug use: No  . Sexual activity: Not on file   Other Topics Concern  . Not on file   Social History Narrative   Patient is married with 3 children still living and 1 deceased son.   Patient is right handed.   Patient has 8 th grade education.   Patient drinks 2-3 servings daily.    FAMILY HISTORY: Family History  Problem Relation Age of Onset  . Heart disease Mother   . Cancer Father 67    throat cancer  . Cancer Cousin     breast cancer    ALLERGIES:  has No Known Allergies.  MEDICATIONS:  Current Outpatient Prescriptions  Medication Sig Dispense Refill  . amLODipine (NORVASC) 10 MG tablet Take 1 tablet (10 mg total) by mouth daily. 90 tablet 3  . aspirin EC 81 MG tablet Take 1 tablet (81 mg total) by mouth daily. 90 tablet 3  . atorvastatin (LIPITOR) 20 MG tablet Take 1 tablet (20 mg total) by mouth daily. 90 tablet 3  . furosemide (LASIX) 20 MG tablet 20 mg daily.     . metoprolol tartrate (LOPRESSOR) 25 MG tablet Take 25 mg by mouth daily.    . ondansetron (ZOFRAN) 8 MG tablet Take 1 tablet (8 mg total) by mouth 2 (two) times daily  as needed (Nausea or vomiting). 30 tablet 1  . prochlorperazine (COMPAZINE) 10 MG tablet Take 1 tablet (10 mg total) by mouth every 6 (six) hours as needed (Nausea or vomiting). 30 tablet 1  . traMADol (ULTRAM) 50 MG tablet Take 1 tablet (50 mg total) by mouth every 8 (eight) hours as needed. 30 tablet 0   No current facility-administered medications for this visit.        REVIEW OF SYSTEMS:   Constitutional: Denies fevers, chills or abnormal night sweats Eyes: Denies blurriness of vision, double vision or watery eyes Ears, nose, mouth, throat, and face: Denies mucositis or sore throat Respiratory: Denies cough, dyspnea or wheezes Cardiovascular: Denies palpitation, chest discomfort or lower extremity swelling Gastrointestinal:  Denies nausea, heartburn  or change in bowel habits Skin: Denies abnormal skin rashes Lymphatics: Denies new lymphadenopathy or easy bruising Neurological:Denies numbness, tingling or new weaknesses Behavioral/Psych: Mood is stable, no new changes  All other systems were reviewed with the patient and are negative.  PHYSICAL EXAMINATION: ECOG PERFORMANCE STATUS: 1  Vitals:   04/09/16 1012  BP: (!) 152/72  Pulse: 69  Resp: 17  Temp: 98.1 F (36.7 C)   Filed Weights   04/09/16 1012  Weight: 183 lb 14.4 oz (83.4 kg)    GENERAL:alert, no distress and comfortable SKIN: skin color, texture, turgor are normal, no rashes or significant lesions EYES: normal, conjunctiva are pink and non-injected, sclera clear OROPHARYNX:no exudate, no erythema and lips, buccal mucosa, and tongue normal  NECK: supple, thyroid normal size, non-tender, without nodularity LYMPH:  no palpable lymphadenopathy in the cervical, axillary or inguinal LUNGS: clear to auscultation and percussion with normal breathing effort HEART: regular rate & rhythm and no murmurs and no lower extremity edema ABDOMEN:abdomen soft, non-tender and normal bowel sounds Musculoskeletal:no cyanosis of  digits and no clubbing  PSYCH: alert & oriented x 3 with fluent speech NEURO: no focal motor/sensory deficits Breasts: Breast inspection showed them to be symmetrical with no nipple discharge. (+) Lymphedema of right breast, no significant skin erythema, palpation of the breasts showed no mass, there is a 3.0x3.5cm (initially 6X6.5cm) mass at the anterior right axilla, close to right chest wall of breast margin, no tenderness, left axilla was negative.   LABORATORY DATA:  I have reviewed the data as listed CBC Latest Ref Rng & Units  04/09/2016 04/02/2016  WBC 3.9 - 10.3 10e3/uL  3.7(L) 4.0  Hemoglobin 11.6 - 15.9 g/dL  10.9(L) 11.0(L)  Hematocrit 34.8 - 46.6 %  33.9(L) 34.1(L)  Platelets 145 - 400 10e3/uL  262 317   CMP Latest Ref Rng & Units  04/09/2016 04/02/2016  Glucose 70 - 140 mg/dl  103 115  BUN 7.0 - 26.0 mg/dL  26.7(H) 24.3  Creatinine 0.6 - 1.1 mg/dL  1.1 1.1  Sodium 136 - 145 mEq/L  142 141  Potassium 3.5 - 5.1 mEq/L  4.0 3.9  Chloride 96 - 106 mmol/L  - -  CO2 22 - 29 mEq/L  21(L) 23  Calcium 8.4 - 10.4 mg/dL  9.6 9.4  Total Protein 6.4 - 8.3 g/dL  8.3 8.1  Total Bilirubin 0.20 - 1.20 mg/dL  0.37 0.28  Alkaline Phos 40 - 150 U/L  55 56  AST 5 - 34 U/L  15 15  ALT 0 - 55 U/L  17 11   CA27.29: 01/05/2016: 155.9 02/27/2016: 96.7 03/26/2016: 64.7   PATHOLOGY REPORT: Diagnosis 11/30/2015 Lymph node, needle/core biopsy, right breast/axilla - LYMPH NODE POSITIVE FOR METASTATIC POORLY DIFFERENTIATED CARCINOMA. - SEE COMMENT. Microscopic Comment Immunohistochemical stains are performed. The tumor is positive for cytokeratin AE1/AE3, cytokeratin 7 and E-cadherin. CDX-2 demonstrates weak positivity. Estrogen receptor demonstrates focal very weak to equivocal staining. Gross cystic disease fluid protein, cytokeratin 20, TTF-1, Melan-A and S100 are all negative. The findings are consistent with a poorly differentiated carcinoma. Given the staining pattern, an upper  gastrointestinal and pancreatobiliary primary source should be ruled out. In addition, given the axillary location of the biopsy and focal weak to equivocal estrogen receptor staining, ruling out a poorly differentiated carcinoma of breast primary source is also prudent. Lastly, a gynecologic primary may be considered. Correlation with clinical and radiologic impression is essential. Dr. Vicente Males has seen this case in consultation with essential  agreement of the above diagnosis and comment. The findings are called to the [facility] on 12/04/15. (RAH:gt, 12/04/15)  Diagnosis 12/27/2015 Breast, right, needle core biopsy, central - INVASIVE MAMMARY CARCINOMA. - MAMMARY CARCINOMA IN SITU. - LYMPH/VASCULAR INVASION IS IDENTIFIED. - SEE COMMENT. Microscopic Comment An E-cadherin stain will be performed to determine if the carcinoma is ductal or lobular in nature. The results of the stain will be reported in an addendum to follow. Although definitive grading of breast carcinoma is best done on excision, the features of the invasive tumor from the right central breast biopsy are compatible with a grade 3 breast carcinoma. Breast prognostic markers will be performed and reported in an addendum. Findings are called to the [facility] on 12/28/2015. Dr. Lyndon Code has seen this case in consultation with agreement. (RH:kh 12-28-15) ADDENDUM: An E-cadherin immunohistochemical stain is performed which is positive in both the invasive and in situ components confirming the ductal nature of both (i.e. invasive ductal carcinoma with ductal carcinoma in situ). (RAH:gt, 12/29/15) PROGNOSTIC INDICATORS Results: IMMUNOHISTOCHEMICAL AND MORPHOMETRIC ANALYSIS PERFORMED MANUALLY Estrogen Receptor: 0%, NEGATIVE Progesterone Receptor: 0%, NEGATIVE Proliferation Marker Ki67: 70% Results: HER2 - NEGATIVE RATIO OF HER2/CEP17 SIGNALS 1.13 AVERAGE HER2 COPY NUMBER PER CELL 1.75  Diagnosis  02/07/2016 Lymph node, needle/core biopsy, left axillary - METASTATIC POORLY DIFFERENTIATED CARCINOMA. Results: IMMUNOHISTOCHEMICAL AND MORPHOMETRIC ANALYSIS PERFORMED MANUALLY Estrogen Receptor: 0%, NEGATIVE Progesterone Receptor: 0%, NEGATIVE Results: HER2 - NEGATIVE RATIO OF HER2/CEP17 SIGNALS 1.21 AVERAGE HER2 COPY NUMBER PER CELL 2.05  RADIOGRAPHIC STUDIES: I have personally reviewed the radiological images as listed and agreed with the findings in the report. No results found. DIAGNOSTIC MAMMOGRAM OF RIGHT BREAST 12/27/2015 FINDINGS: CC and MLO views of the right breast were performed with tomosynthesis. The right breast demonstrates diffuse skin thickening and increased density. There is right axillary adenopathy. No focal mass is identified within the breast.  Mammographic images were processed with CAD.  On physical exam, there is palpable right axillary adenopathy including a 5 cm right axillary lymph node. The right breast and nipple are asymmetrically enlarged relative to the left.  Targeted ultrasound is performed, showing numerous enlarged right axillary lymph nodes, the largest measuring 5.3 x 4.2 x 2.6 cm.  IMPRESSION: Suspicious right axillary mass.  Ct CHEST WITH CONTRAST 09/27/2015 IMPRESSION: 1. 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 The right breast is enlarged with increased soft tissue attenuation, skin thickening and possible nipple retraction. There are bulky right axillary lymph nodes with smaller right supraclavicular lymph nodes. 2. No other evidence of metastatic disease. 3. Mild cardiomegaly. 4. No acute findings in the lungs.   ASSESSMENT & PLAN:  70 year old female, with past medical history of HTN, GERD, sleep apnea, presented with bulky right axillary adenopathy.  1. 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 Unfortunately her cancer has metastasized to left axilla and cervical lymph nodes, which are distant metastasis.  -I discussed her left axillary node biopsy, which confirmed metastasis  -We reviewed the natural history of metastatic triple negative breast cancer, which is very aggressive, and her  cancer is incurable at this stage  -Giving the metastatic disease, surgery up front is not indicated, I recommend systemic chemotherapy. -She has started weekly Taxol, tolerating well, no neuropathy or other side effects so far, we will continue -Her right axillary lymph node has gotten smaller since she started chemotherapy, corresponding to good clinical response -I plan to repeat her CT or PET scan after 2-3 months of treatment. -She has difficulty with her vein acces, I strongly encouraged her to consider port placement. She will think about it, but has not agreed to do so -She is clinically doing well, lab reviewed, we'll continue chemotherapy  -Plan to repeat restaging CT scans in 3 weeks   2. ANEMIA  -She developed normocytic mild anemia in the past few months, hemoglobin 10.9 today  -No clinical signs of bleeding. Possible related to her underlying malignancy -Her anemia workup shows a normal folic acid and J19 level, increased ferritin, decreased serum iron, TIBC and transferrin saturation. This is most consistent with anemia of chronic disease, secondary to her CKD and underlying malignancy. -I encourage her to take oral iron supplement   3. CKD stage III -Her GFR is in 40-50's  -No lab signs of tumor lysis -Possibly related to her underlying hypertension  and diuretics  -Her Cr has much improved since she stopped her Lasix. -Avoid nephrotoxins, such as NSAIDs and IV CT contrast  4. HTN, GERD, sleep apnea -She'll continue follow-up with her primary care physician   PLAN -Continue weekly Taxol -I will see her back in 3 weeks, with restaging CT scan of chest, abdomen and pelvis with contrast if Cr<1.2 a few days before  I spent 20 minutes counseling the patient face to face. The total time spent in the appointment was 25 minute s and more than 50% was on counseling.     Truitt Merle, MD 04/09/2016

## 2016-04-09 NOTE — Patient Instructions (Signed)
[facility] Discharge Instructions for Patients Receiving Chemotherapy  Today you received the following chemotherapy agents Paclitaxel.   To help prevent nausea and vomiting after your treatment, we encourage you to take your nausea medication as directed.    If you develop nausea and vomiting that is not controlled by your nausea medication, call the clinic.   BELOW ARE SYMPTOMS THAT SHOULD BE REPORTED IMMEDIATELY:  *FEVER GREATER THAN 100.5 F  *CHILLS WITH OR WITHOUT FEVER  NAUSEA AND VOMITING THAT IS NOT CONTROLLED WITH YOUR NAUSEA MEDICATION  *UNUSUAL SHORTNESS OF BREATH  *UNUSUAL BRUISING OR BLEEDING  TENDERNESS IN MOUTH AND THROAT WITH OR WITHOUT PRESENCE OF ULCERS  *URINARY PROBLEMS  *BOWEL PROBLEMS  UNUSUAL RASH Items with * indicate a potential emergency and should be followed up as soon as possible.  Feel free to call the clinic you have any questions or concerns. The clinic phone number is [phone].  Please show the CHEMO ALERT CARD at check-in to the Emergency Department and triage nurse.   

## 2016-04-15 ENCOUNTER — Telehealth: Payer: Self-pay | Admitting: Hematology

## 2016-04-15 NOTE — Telephone Encounter (Signed)
Spoke with patient re November appointments. Patient will get new schedule 10/31.

## 2016-04-16 ENCOUNTER — Other Ambulatory Visit (HOSPITAL_BASED_OUTPATIENT_CLINIC_OR_DEPARTMENT_OTHER): Payer: Commercial Managed Care - HMO

## 2016-04-16 ENCOUNTER — Ambulatory Visit (HOSPITAL_BASED_OUTPATIENT_CLINIC_OR_DEPARTMENT_OTHER): Payer: Commercial Managed Care - HMO

## 2016-04-16 ENCOUNTER — Encounter: Payer: Self-pay | Admitting: Hematology

## 2016-04-16 VITALS — BP 140/70 | HR 71 | Temp 98.2°F | Resp 18

## 2016-04-16 DIAGNOSIS — Z5111 Encounter for antineoplastic chemotherapy: Secondary | ICD-10-CM

## 2016-04-16 DIAGNOSIS — C773 Secondary and unspecified malignant neoplasm of axilla and upper limb lymph nodes: Secondary | ICD-10-CM | POA: Diagnosis not present

## 2016-04-16 DIAGNOSIS — C50911 Malignant neoplasm of unspecified site of right female breast: Secondary | ICD-10-CM | POA: Diagnosis not present

## 2016-04-16 LAB — COMPREHENSIVE METABOLIC PANEL
ALBUMIN: 3.7 g/dL (ref 3.5–5.0)
ALK PHOS: 53 U/L (ref 40–150)
ALT: 13 U/L (ref 0–55)
ANION GAP: 11 meq/L (ref 3–11)
AST: 14 U/L (ref 5–34)
BILIRUBIN TOTAL: 0.42 mg/dL (ref 0.20–1.20)
BUN: 31.1 mg/dL — ABNORMAL HIGH (ref 7.0–26.0)
CALCIUM: 9.3 mg/dL (ref 8.4–10.4)
CO2: 22 meq/L (ref 22–29)
CREATININE: 1.3 mg/dL — AB (ref 0.6–1.1)
Chloride: 110 mEq/L — ABNORMAL HIGH (ref 98–109)
EGFR: 49 mL/min/{1.73_m2} — ABNORMAL LOW (ref >90)
Glucose: 109 mg/dl (ref 70–140)
Potassium: 3.7 mEq/L (ref 3.5–5.1)
Sodium: 142 mEq/L (ref 136–145)
TOTAL PROTEIN: 8 g/dL (ref 6.4–8.3)

## 2016-04-16 LAB — CBC WITH DIFFERENTIAL/PLATELET
BASO%: 2.1 % — AB (ref 0.0–2.0)
Basophils Absolute: 0.1 10*3/uL (ref 0.0–0.1)
EOS ABS: 0.1 10*3/uL (ref 0.0–0.5)
EOS%: 2.1 % (ref 0.0–7.0)
HEMATOCRIT: 32.5 % — AB (ref 34.8–46.6)
HEMOGLOBIN: 10.5 g/dL — AB (ref 11.6–15.9)
LYMPH#: 0.7 10*3/uL — AB (ref 0.9–3.3)
LYMPH%: 18.8 % (ref 14.0–49.7)
MCH: 27.9 pg (ref 25.1–34.0)
MCHC: 32.3 g/dL (ref 31.5–36.0)
MCV: 86.4 fL (ref 79.5–101.0)
MONO#: 0.3 10*3/uL (ref 0.1–0.9)
MONO%: 7.4 % (ref 0.0–14.0)
NEUT%: 69.6 % (ref 38.4–76.8)
NEUTROS ABS: 2.6 10*3/uL (ref 1.5–6.5)
PLATELETS: 261 10*3/uL (ref 145–400)
RBC: 3.76 10*6/uL (ref 3.70–5.45)
RDW: 19.3 % — ABNORMAL HIGH (ref 11.2–14.5)
WBC: 3.8 10*3/uL — AB (ref 3.9–10.3)

## 2016-04-16 MED ORDER — SODIUM CHLORIDE 0.9 % IV SOLN
Freq: Once | INTRAVENOUS | Status: AC
  Administered 2016-04-16: 16:00:00 via INTRAVENOUS

## 2016-04-16 MED ORDER — FAMOTIDINE IN NACL 20-0.9 MG/50ML-% IV SOLN
INTRAVENOUS | Status: AC
  Filled 2016-04-16: qty 50

## 2016-04-16 MED ORDER — DEXAMETHASONE SODIUM PHOSPHATE 10 MG/ML IJ SOLN
10.0000 mg | Freq: Once | INTRAMUSCULAR | Status: AC
  Administered 2016-04-16: 10 mg via INTRAVENOUS

## 2016-04-16 MED ORDER — DIPHENHYDRAMINE HCL 50 MG/ML IJ SOLN
INTRAMUSCULAR | Status: AC
  Filled 2016-04-16: qty 1

## 2016-04-16 MED ORDER — DIPHENHYDRAMINE HCL 50 MG/ML IJ SOLN
25.0000 mg | Freq: Once | INTRAMUSCULAR | Status: AC
  Administered 2016-04-16: 25 mg via INTRAVENOUS

## 2016-04-16 MED ORDER — FAMOTIDINE IN NACL 20-0.9 MG/50ML-% IV SOLN
20.0000 mg | Freq: Once | INTRAVENOUS | Status: AC
  Administered 2016-04-16: 20 mg via INTRAVENOUS

## 2016-04-16 MED ORDER — DEXAMETHASONE SODIUM PHOSPHATE 10 MG/ML IJ SOLN
INTRAMUSCULAR | Status: AC
  Filled 2016-04-16: qty 1

## 2016-04-16 MED ORDER — PACLITAXEL CHEMO INJECTION 300 MG/50ML
80.0000 mg/m2 | Freq: Once | INTRAVENOUS | Status: AC
  Administered 2016-04-16: 156 mg via INTRAVENOUS
  Filled 2016-04-16: qty 26

## 2016-04-16 NOTE — Patient Instructions (Signed)
[facility] Discharge Instructions for Patients Receiving Chemotherapy  Today you received the following chemotherapy agents Taxol   To help prevent nausea and vomiting after your treatment, we encourage you to take your nausea medication as directed.   If you develop nausea and vomiting that is not controlled by your nausea medication, call the clinic.   BELOW ARE SYMPTOMS THAT SHOULD BE REPORTED IMMEDIATELY:  *FEVER GREATER THAN 100.5 F  *CHILLS WITH OR WITHOUT FEVER  NAUSEA AND VOMITING THAT IS NOT CONTROLLED WITH YOUR NAUSEA MEDICATION  *UNUSUAL SHORTNESS OF BREATH  *UNUSUAL BRUISING OR BLEEDING  TENDERNESS IN MOUTH AND THROAT WITH OR WITHOUT PRESENCE OF ULCERS  *URINARY PROBLEMS  *BOWEL PROBLEMS  UNUSUAL RASH Items with * indicate a potential emergency and should be followed up as soon as possible.  Feel free to call the clinic you have any questions or concerns. The clinic phone number is [phone].  Please show the CHEMO ALERT CARD at check-in to the Emergency Department and triage nurse.   

## 2016-04-16 NOTE — Progress Notes (Signed)
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 Advised patient again that the company must be willing to do the work before receiving our check and submit an invoice and their W-9. Patient states she would continue to look around for someone that would be willing to work with her. Patient also states she needed a gas card. Gas card given and advised her that amount would be deducted from her grant balance as well. Patient verbalized understanding and has my card for any additional financial questions or concerns.

## 2016-04-17 ENCOUNTER — Encounter: Payer: Self-pay | Admitting: Hematology

## 2016-04-23 ENCOUNTER — Telehealth: Payer: Self-pay | Admitting: Hematology

## 2016-04-23 ENCOUNTER — Ambulatory Visit (HOSPITAL_BASED_OUTPATIENT_CLINIC_OR_DEPARTMENT_OTHER): Payer: Commercial Managed Care - HMO

## 2016-04-23 ENCOUNTER — Other Ambulatory Visit (HOSPITAL_BASED_OUTPATIENT_CLINIC_OR_DEPARTMENT_OTHER): Payer: Commercial Managed Care - HMO

## 2016-04-23 VITALS — BP 162/81 | HR 79 | Temp 98.4°F | Resp 18

## 2016-04-23 DIAGNOSIS — Z5111 Encounter for antineoplastic chemotherapy: Secondary | ICD-10-CM

## 2016-04-23 DIAGNOSIS — C773 Secondary and unspecified malignant neoplasm of axilla and upper limb lymph nodes: Secondary | ICD-10-CM | POA: Diagnosis not present

## 2016-04-23 DIAGNOSIS — C50911 Malignant neoplasm of unspecified site of right female breast: Secondary | ICD-10-CM

## 2016-04-23 DIAGNOSIS — C799 Secondary malignant neoplasm of unspecified site: Secondary | ICD-10-CM | POA: Diagnosis not present

## 2016-04-23 LAB — CBC WITH DIFFERENTIAL/PLATELET
BASO%: 1.7 % (ref 0.0–2.0)
Basophils Absolute: 0.1 10*3/uL (ref 0.0–0.1)
EOS ABS: 0.1 10*3/uL (ref 0.0–0.5)
EOS%: 3 % (ref 0.0–7.0)
HEMATOCRIT: 34.9 % (ref 34.8–46.6)
HEMOGLOBIN: 11.3 g/dL — AB (ref 11.6–15.9)
LYMPH#: 0.7 10*3/uL — AB (ref 0.9–3.3)
LYMPH%: 17.7 % (ref 14.0–49.7)
MCH: 28.1 pg (ref 25.1–34.0)
MCHC: 32.4 g/dL (ref 31.5–36.0)
MCV: 86.8 fL (ref 79.5–101.0)
MONO#: 0.3 10*3/uL (ref 0.1–0.9)
MONO%: 7 % (ref 0.0–14.0)
NEUT%: 70.6 % (ref 38.4–76.8)
NEUTROS ABS: 2.8 10*3/uL (ref 1.5–6.5)
PLATELETS: 254 10*3/uL (ref 145–400)
RBC: 4.02 10*6/uL (ref 3.70–5.45)
RDW: 18.8 % — ABNORMAL HIGH (ref 11.2–14.5)
WBC: 4 10*3/uL (ref 3.9–10.3)

## 2016-04-23 LAB — COMPREHENSIVE METABOLIC PANEL
ALBUMIN: 3.8 g/dL (ref 3.5–5.0)
ALK PHOS: 55 U/L (ref 40–150)
ALT: 14 U/L (ref 0–55)
ANION GAP: 9 meq/L (ref 3–11)
AST: 15 U/L (ref 5–34)
BILIRUBIN TOTAL: 0.38 mg/dL (ref 0.20–1.20)
BUN: 24.6 mg/dL (ref 7.0–26.0)
CALCIUM: 9.3 mg/dL (ref 8.4–10.4)
CO2: 23 meq/L (ref 22–29)
CREATININE: 1 mg/dL (ref 0.6–1.1)
Chloride: 108 mEq/L (ref 98–109)
EGFR: 65 mL/min/{1.73_m2} — AB (ref >90)
Glucose: 94 mg/dl (ref 70–140)
Potassium: 3.8 mEq/L (ref 3.5–5.1)
Sodium: 140 mEq/L (ref 136–145)
TOTAL PROTEIN: 8.1 g/dL (ref 6.4–8.3)

## 2016-04-23 MED ORDER — FAMOTIDINE IN NACL 20-0.9 MG/50ML-% IV SOLN
20.0000 mg | Freq: Once | INTRAVENOUS | Status: AC
  Administered 2016-04-23: 20 mg via INTRAVENOUS

## 2016-04-23 MED ORDER — FAMOTIDINE IN NACL 20-0.9 MG/50ML-% IV SOLN
INTRAVENOUS | Status: AC
  Filled 2016-04-23: qty 50

## 2016-04-23 MED ORDER — DEXAMETHASONE SODIUM PHOSPHATE 10 MG/ML IJ SOLN
10.0000 mg | Freq: Once | INTRAMUSCULAR | Status: AC
  Administered 2016-04-23: 10 mg via INTRAVENOUS

## 2016-04-23 MED ORDER — DEXAMETHASONE SODIUM PHOSPHATE 10 MG/ML IJ SOLN
INTRAMUSCULAR | Status: AC
  Filled 2016-04-23: qty 1

## 2016-04-23 MED ORDER — SODIUM CHLORIDE 0.9 % IV SOLN
Freq: Once | INTRAVENOUS | Status: AC
  Administered 2016-04-23: 14:00:00 via INTRAVENOUS

## 2016-04-23 MED ORDER — DIPHENHYDRAMINE HCL 50 MG/ML IJ SOLN
25.0000 mg | Freq: Once | INTRAMUSCULAR | Status: AC
  Administered 2016-04-23: 25 mg via INTRAVENOUS

## 2016-04-23 MED ORDER — DIPHENHYDRAMINE HCL 50 MG/ML IJ SOLN
INTRAMUSCULAR | Status: AC
  Filled 2016-04-23: qty 1

## 2016-04-23 MED ORDER — PACLITAXEL CHEMO INJECTION 300 MG/50ML
80.0000 mg/m2 | Freq: Once | INTRAVENOUS | Status: AC
  Administered 2016-04-23: 156 mg via INTRAVENOUS
  Filled 2016-04-23: qty 26

## 2016-04-23 NOTE — Patient Instructions (Signed)
[facility] Discharge Instructions for Patients Receiving Chemotherapy  Today you received the following chemotherapy agents Taxol   To help prevent nausea and vomiting after your treatment, we encourage you to take your nausea medication as directed.   If you develop nausea and vomiting that is not controlled by your nausea medication, call the clinic.   BELOW ARE SYMPTOMS THAT SHOULD BE REPORTED IMMEDIATELY:  *FEVER GREATER THAN 100.5 F  *CHILLS WITH OR WITHOUT FEVER  NAUSEA AND VOMITING THAT IS NOT CONTROLLED WITH YOUR NAUSEA MEDICATION  *UNUSUAL SHORTNESS OF BREATH  *UNUSUAL BRUISING OR BLEEDING  TENDERNESS IN MOUTH AND THROAT WITH OR WITHOUT PRESENCE OF ULCERS  *URINARY PROBLEMS  *BOWEL PROBLEMS  UNUSUAL RASH Items with * indicate a potential emergency and should be followed up as soon as possible.  Feel free to call the clinic you have any questions or concerns. The clinic phone number is [phone].  Please show the CHEMO ALERT CARD at check-in to the Emergency Department and triage nurse.   

## 2016-04-23 NOTE — Telephone Encounter (Signed)
2 bottles of contrast and instructions given to patient's relative, per CT appointment on 04/24/16.

## 2016-04-24 ENCOUNTER — Ambulatory Visit (HOSPITAL_COMMUNITY)
Admission: RE | Admit: 2016-04-24 | Discharge: 2016-04-24 | Disposition: A | Discharge status: Home / Self Care | Payer: Commercial Managed Care - HMO | Source: Ambulatory Visit | Attending: Hematology | Admitting: Hematology

## 2016-04-24 ENCOUNTER — Encounter (HOSPITAL_COMMUNITY): Payer: Self-pay | Admitting: Radiology

## 2016-04-24 DIAGNOSIS — K449 Diaphragmatic hernia without obstruction or gangrene: Secondary | ICD-10-CM | POA: Diagnosis not present

## 2016-04-24 DIAGNOSIS — N6489 Other specified disorders of breast: Secondary | ICD-10-CM | POA: Insufficient documentation

## 2016-04-24 DIAGNOSIS — I700 Atherosclerosis of aorta: Secondary | ICD-10-CM | POA: Insufficient documentation

## 2016-04-24 DIAGNOSIS — C50911 Malignant neoplasm of unspecified site of right female breast: Secondary | ICD-10-CM | POA: Diagnosis not present

## 2016-04-24 DIAGNOSIS — R590 Localized enlarged lymph nodes: Secondary | ICD-10-CM | POA: Insufficient documentation

## 2016-04-24 DIAGNOSIS — K769 Liver disease, unspecified: Secondary | ICD-10-CM | POA: Diagnosis not present

## 2016-04-24 DIAGNOSIS — C799 Secondary malignant neoplasm of unspecified site: Secondary | ICD-10-CM | POA: Diagnosis not present

## 2016-04-24 DIAGNOSIS — I2510 Atherosclerotic heart disease of native coronary artery without angina pectoris: Secondary | ICD-10-CM | POA: Diagnosis not present

## 2016-04-24 LAB — CANCER ANTIGEN 27.29: CAN 27.29: 65.4 U/mL — AB (ref 0.0–38.6)

## 2016-04-24 MED ORDER — IOPAMIDOL (ISOVUE-300) INJECTION 61%
100.0000 mL | Freq: Once | INTRAVENOUS | Status: AC | PRN
  Administered 2016-04-24: 100 mL via INTRAVENOUS

## 2016-04-29 NOTE — Progress Notes (Signed)
[place]  [phone] [phone]  Clinic Follow Up Note   Patient Care Team: [name], MD as PCP - General (Internal Medicine) 04/29/2016   CHIEF COMPLAINTS:  Follow up right breast cancer   Oncology History   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 [facility])   Staging form: Breast, AJCC 7th Edition   - Clinical stage from 11/30/2015: Stage IV (T4d, N3c, M1) - Signed by Truitt Merle, MD on 01/05/2016      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 [facility])   11/30/2015 Initial Diagnosis    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 [facility])      11/30/2015 Initial Biopsy    Right axilla lymph node biopsy showed metastatic poorly differentiated adenocarcinoma, IHC positive for CK AE1/AE3, CK7, E-cadherin, CDX-2, and focally very weak to equivocal staining for ER      12/21/2015 Imaging    Bilateral breast MRI showed extensive non-mass enhancement involving the entire right breast, right axillary, supraclavicular, and internal mammary adenopathy, new left axillary adenopathy      12/27/2015 Mammogram    Diagnostic mammogram and ultrasound of right breast and axilla showed diffuse skin thickening and increased density, no focal mass, numerous enlarged right axillary lymph node, the largest measuring 5.3cm.       12/27/2015 Initial Biopsy    Right breast core needle biopsy showed invasive ductal carcinoma, and DCIS, lymphovascular invasion identified      12/27/2015 Receptors her2    ER negative, PR negative, Ki-67 70%, HER-2 negative      01/03/2016 Imaging    PET scan showed diffuse asymmetric right breast hypermetabolic soft tissue density and skin thickening, hypermetabolic metastatic lymphadenopathy in bilateral axillary and subpectoral regions, right supraclavicular region and the right cervical level 5      01/10/2016 -  Chemotherapy    Weekly Taxol 80 mg/m, second cycle was postponed  to 8/22 due to pt's non-compliance       02/07/2016 Pathology Results    Left axillary lymph node biopsy showed metastatic poorly differentiated carcinoma, triple negative.       HISTORY OF PRESENTING ILLNESS (12/14/2015) :  [name] 70 y.o. 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 She is accompanied by her husband to the clinic today.  She noticed a lump at right axilla in 08/2015, no pain or other complains, she also report right breast swelling, no pain, skin erythema, nipple discharge or other complain. She otherwise feels well. She was initially seen at urgent care in March, then was referred to establish her care with prior care physician at [facility] internal medicine center. Her screening mammogram was negative in January 2017. She subsequently underwent CT chest on 09/27/2015 which showed enlarged and inflamed right breast, bulky right axillary lymph nodes. She was referred to breast Center for diagnostic mammogram and ultrasound of right breast, which was negative. She underwent ultrasound guided right axillary node biopsy on 12/05/2015, which reviewed port differentiated carcinoma. ER weakly focally positive.  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 She works independently. She lives with her husband. She denies any fever, night sweats, chills, or skin itchiness.  CURRENT THERAPY: chemotherapy with weekly Taxol, started on 01/10/2016, second cycle was delayed due to pt's non-compliance   INTERIM HISTORY:  [name] returns for follow up and discuss restaging CT scan findings. She is doing very well, tolerating chemotherapy without  significant side effects. She denies neuropathy, pain, or other symptoms. She has good appetite and energy level, weight is stable.  MEDICAL HISTORY:  Past Medical History:  Diagnosis Date  . Arthritis   . GERD (gastroesophageal reflux disease)   . Hypertension   . Shortness of breath   . Sleep apnea   . Stroke [facility])     SURGICAL HISTORY: Past Surgical History:  Procedure Laterality Date  . NO PAST SURGERIES      SOCIAL HISTORY: Social History   Social History  . Marital status: Married    Spouse name: N/A  . Number of children: 4  . Years of education: 8 TH   Occupational History  . Not on file.   Social History Main Topics  . Smoking status: Former Smoker    Packs/day: 0.50    Years: 1.00    Types: Cigarettes    Start date: 06/17/1968    Quit date: 06/17/1973  . Smokeless tobacco: Never Used     Comment: Quit in 32s  . Alcohol use No     Comment: former drinker for approximately 10 years  . Drug use: No  . Sexual activity: Not on file   Other Topics Concern  . Not on file   Social History Narrative   Patient is married with 3 children still living and 1 deceased son.   Patient is right handed.   Patient has 8 th grade education.   Patient drinks 2-3 servings daily.    FAMILY HISTORY: Family History  Problem Relation Age of Onset  . Heart disease Mother   . Cancer Father 30    throat cancer  . Cancer Cousin     breast cancer    ALLERGIES:  has No Known Allergies.  MEDICATIONS:  Current Outpatient Prescriptions  Medication Sig Dispense Refill  . amLODipine (NORVASC) 10 MG tablet Take 1 tablet (10 mg total) by mouth daily. 90 tablet 3  . aspirin EC 81 MG tablet Take 1 tablet (81 mg total) by mouth daily. 90 tablet 3  . atorvastatin (LIPITOR) 20 MG tablet Take 1 tablet (20 mg total) by mouth daily. 90 tablet 3  . furosemide (LASIX) 20 MG tablet 20 mg daily.     . metoprolol tartrate (LOPRESSOR) 25 MG tablet Take 25 mg by mouth daily.    .  ondansetron (ZOFRAN) 8 MG tablet Take 1 tablet (8 mg total) by mouth 2 (two) times daily as needed (Nausea or vomiting). 30 tablet 1  . prochlorperazine (COMPAZINE) 10 MG tablet Take 1 tablet (10 mg total) by mouth every 6 (six) hours as needed (Nausea or vomiting). 30 tablet 1  . traMADol (ULTRAM) 50 MG tablet Take 1 tablet (50 mg total) by mouth every 8 (eight) hours as needed. 30 tablet 0   No current facility-administered medications for this visit.        REVIEW OF SYSTEMS:   Constitutional: Denies fevers, chills or abnormal night sweats Eyes: Denies blurriness of vision, double vision or watery eyes Ears, nose, mouth, throat, and face: Denies mucositis or sore throat Respiratory: Denies cough, dyspnea or wheezes Cardiovascular: Denies  palpitation, chest discomfort or lower extremity swelling Gastrointestinal:  Denies nausea, heartburn or change in bowel habits Skin: Denies abnormal skin rashes Lymphatics: Denies new lymphadenopathy or easy bruising Neurological:Denies numbness, tingling or new weaknesses Behavioral/Psych: Mood is stable, no new changes  All other systems were reviewed with the patient and are negative.  PHYSICAL EXAMINATION: ECOG PERFORMANCE STATUS: 1  Vitals:   04/30/16 1257  BP: (!) 151/79  Pulse: 80  Resp: 16  Temp: 97.7 F (36.5 C)   Filed Weights   04/30/16 1257  Weight: 188 lb 1.6 oz (85.3 kg)    GENERAL:alert, no distress and comfortable SKIN: skin color, texture, turgor are normal, no rashes or significant lesions EYES: normal, conjunctiva are pink and non-injected, sclera clear OROPHARYNX:no exudate, no erythema and lips, buccal mucosa, and tongue normal  NECK: supple, thyroid normal size, non-tender, without nodularity LYMPH:  no palpable lymphadenopathy in the cervical, axillary or inguinal LUNGS: clear to auscultation and percussion with normal breathing effort HEART: regular rate & rhythm and no murmurs and no lower extremity  edema ABDOMEN:abdomen soft, non-tender and normal bowel sounds Musculoskeletal:no cyanosis of digits and no clubbing  PSYCH: alert & oriented x 3 with fluent speech NEURO: no focal motor/sensory deficits Breasts: Breast inspection showed them to be symmetrical with no nipple discharge. (+) Lymphedema of right breast, no significant skin erythema, palpation of the breasts showed no mass, there is a 3.0x3.5cm (initially 6X6.5cm) mass at the anterior right axilla, close to right chest wall of breast margin, no tenderness, left axilla was negative.   LABORATORY DATA:  I have reviewed the data as listed CBC Latest Ref Rng & Units 04/30/2016 04/23/2016 04/16/2016  WBC 3.9 - 10.3 10e3/uL 3.8(L) 4.0 3.8(L)  Hemoglobin 11.6 - 15.9 g/dL 11.7 11.3(L) 10.5(L)  Hematocrit 34.8 - 46.6 % 37.1 34.9 32.5(L)  Platelets 145 - 400 10e3/uL 262 254 261   CMP Latest Ref Rng & Units 04/30/2016 04/23/2016 04/16/2016  Glucose 70 - 140 mg/dl 85 94 109  BUN 7.0 - 26.0 mg/dL 28.3(H) 24.6 31.1(H)  Creatinine 0.6 - 1.1 mg/dL 1.2(H) 1.0 1.3(H)  Sodium 136 - 145 mEq/L 140 140 142  Potassium 3.5 - 5.1 mEq/L 4.0 3.8 3.7  Chloride 96 - 106 mmol/L - - -  CO2 22 - 29 mEq/L 22 23 22   Calcium 8.4 - 10.4 mg/dL 9.4 9.3 9.3  Total Protein 6.4 - 8.3 g/dL 7.9 8.1 8.0  Total Bilirubin 0.20 - 1.20 mg/dL 0.37 0.38 0.42  Alkaline Phos 40 - 150 U/L 56 55 53  AST 5 - 34 U/L 18 15 14   ALT 0 - 55 U/L 18 14 13     CA27.29: 01/05/2016: 155.9 02/27/2016: 96.7 03/26/2016: 64.7 04/23/2016: 65.4   PATHOLOGY REPORT: Diagnosis 11/30/2015 Lymph node, needle/core biopsy, right breast/axilla - LYMPH NODE POSITIVE FOR METASTATIC POORLY DIFFERENTIATED CARCINOMA. - SEE COMMENT. Microscopic Comment Immunohistochemical stains are performed. The tumor is positive for cytokeratin AE1/AE3, cytokeratin 7 and E-cadherin. CDX-2 demonstrates weak positivity. Estrogen receptor demonstrates focal very weak to equivocal staining. Gross cystic disease  fluid protein, cytokeratin 20, TTF-1, Melan-A and S100 are all negative. The findings are consistent with a poorly differentiated carcinoma. Given the staining pattern, an upper gastrointestinal and pancreatobiliary primary source should be ruled out. In addition, given the axillary location of the biopsy and focal weak to equivocal estrogen receptor staining, ruling out a poorly differentiated carcinoma of breast primary source is also prudent. Lastly, a gynecologic primary may be considered. Correlation with clinical and  radiologic impression is essential. Dr. Vicente Males has seen this case in consultation with essential agreement of the above diagnosis and comment. The findings are called to the [facility] on 12/04/15. (RAH:gt, 12/04/15)  Diagnosis 12/27/2015 Breast, right, needle core biopsy, central - INVASIVE MAMMARY CARCINOMA. - MAMMARY CARCINOMA IN SITU. - LYMPH/VASCULAR INVASION IS IDENTIFIED. - SEE COMMENT. Microscopic Comment An E-cadherin stain will be performed to determine if the carcinoma is ductal or lobular in nature. The results of the stain will be reported in an addendum to follow. Although definitive grading of breast carcinoma is best done on excision, the features of the invasive tumor from the right central breast biopsy are compatible with a grade 3 breast carcinoma. Breast prognostic markers will be performed and reported in an addendum. Findings are called to the [facility] on 12/28/2015. Dr. Lyndon Code has seen this case in consultation with agreement. (RH:kh 12-28-15) ADDENDUM: An E-cadherin immunohistochemical stain is performed which is positive in both the invasive and in situ components confirming the ductal nature of both (i.e. invasive ductal carcinoma with ductal carcinoma in situ). (RAH:gt, 12/29/15) PROGNOSTIC INDICATORS Results: IMMUNOHISTOCHEMICAL AND MORPHOMETRIC ANALYSIS PERFORMED MANUALLY Estrogen Receptor: 0%,  NEGATIVE Progesterone Receptor: 0%, NEGATIVE Proliferation Marker Ki67: 70% Results: HER2 - NEGATIVE RATIO OF HER2/CEP17 SIGNALS 1.13 AVERAGE HER2 COPY NUMBER PER CELL 1.75  Diagnosis 02/07/2016 Lymph node, needle/core biopsy, left axillary - METASTATIC POORLY DIFFERENTIATED CARCINOMA. Results: IMMUNOHISTOCHEMICAL AND MORPHOMETRIC ANALYSIS PERFORMED MANUALLY Estrogen Receptor: 0%, NEGATIVE Progesterone Receptor: 0%, NEGATIVE Results: HER2 - NEGATIVE RATIO OF HER2/CEP17 SIGNALS 1.21 AVERAGE HER2 COPY NUMBER PER CELL 2.05  RADIOGRAPHIC STUDIES: I have personally reviewed the radiological images as listed and agreed with the findings in the report. Ct Chest W Contrast  Result Date: 04/25/2016 CLINICAL DATA:  Metastatic right breast cancer diagnosed June 2017 presenting for restaging with ongoing chemotherapy. EXAM: CT CHEST, ABDOMEN, AND PELVIS WITH CONTRAST TECHNIQUE: Multidetector CT imaging of the chest, abdomen and pelvis was performed following the standard protocol during bolus administration of intravenous contrast. CONTRAST:  148m ISOVUE-300 IOPAMIDOL (ISOVUE-300) INJECTION 61% COMPARISON:  01/03/2016 PET-CT. 09/27/2015 chest CT. 06/30/2004 unenhanced CT abdomen/ pelvis. FINDINGS: CT CHEST FINDINGS Cardiovascular: Normal heart size. No significant pericardial fluid/thickening. Left anterior descending coronary atherosclerosis. Atherosclerotic nonaneurysmal thoracic aorta. Normal caliber pulmonary arteries. No central pulmonary emboli. Mediastinum/Nodes: No discrete thyroid nodules. Unremarkable esophagus. Bulky right axillary adenopathy measuring up to the 3.1 cm (series 3/ image 16), previously 3.4 cm on 01/03/2016, mildly decreased. Mild right retropectoral adenopathy measuring up to 1.2 cm (series 3/image 12), previously 1.6 cm, mildly decreased. Right supraclavicular 0.8 cm node (series 3/image 4), decreased from 1.5 cm. Mild left axillary lymphadenopathy measuring up to 1.3 cm  (series 3/image 18), previously 0.8 cm, increased. No pathologically enlarged mediastinal or hilar nodes. Lungs/Pleura: No pneumothorax. No pleural effusion. No acute consolidative airspace disease, lung masses or significant pulmonary nodules. Musculoskeletal: No aggressive appearing focal osseous lesions. Moderate thoracic spondylosis. Prominent asymmetric skin thickening throughout the right breast with asymmetric thickening of the right breast fibroglandular tissues is not appreciably changed. CT ABDOMEN PELVIS FINDINGS Hepatobiliary: Normal liver size. Hypodense 0.5 cm posterior right liver lobe lesion (series 3/ image 44), too small to characterize, not definitely changed since 01/03/2016 unenhanced CT images. No additional liver lesions. Normal gallbladder with no radiopaque cholelithiasis. No biliary ductal dilatation. Pancreas: Normal, with no mass or duct dilation. Spleen: Normal size. No mass. Adrenals/Urinary Tract: No discrete adrenal nodules. Two hypodense subcentimeter renal cortical lesions  in the right kidney are too small to characterize. Otherwise normal kidneys, with no hydronephrosis. Normal bladder. Stomach/Bowel: Tiny hiatal hernia. Otherwise grossly normal stomach. Normal caliber small bowel with no small bowel wall thickening. Normal appendix. Normal large bowel with no diverticulosis, large bowel wall thickening or pericolonic fat stranding. Vascular/Lymphatic: Atherosclerotic nonaneurysmal abdominal aorta. Patent portal, splenic and renal veins . No pathologically enlarged lymph nodes in the abdomen or pelvis. Reproductive: Stable mildly enlarged myomatous anteverted uterus with scattered coarse calcifications in the uterus. No adnexal mass. Other: No pneumoperitoneum, ascites or focal fluid collection. Musculoskeletal: No aggressive appearing focal osseous lesions. Moderate lumbar spondylosis. IMPRESSION: 1. Bulky right axillary and mild right retropectoral and right supraclavicular  lymphadenopathy is mildly decreased since 01/03/2016 PET-CT. 2. Left axillary lymphadenopathy is mildly increased. 3. Diffuse asymmetric thickening of the skin and fibroglandular soft tissues of the right breast is not appreciably changed. 4. No definite additional sites of metastatic disease in the chest, abdomen or pelvis. 5. Subcentimeter right liver lobe lesion is too small to characterize and is probably stable. 6. Additional findings include aortic atherosclerosis, 1 vessel coronary atherosclerosis, tiny hiatal hernia and myomatous uterus. Electronically Signed   By: Ilona Sorrel M.D.   On: 04/25/2016 08:53   Ct Abdomen Pelvis W Contrast  Result Date: 04/25/2016 CLINICAL DATA:  Metastatic right breast cancer diagnosed June 2017 presenting for restaging with ongoing chemotherapy. EXAM: CT CHEST, ABDOMEN, AND PELVIS WITH CONTRAST TECHNIQUE: Multidetector CT imaging of the chest, abdomen and pelvis was performed following the standard protocol during bolus administration of intravenous contrast. CONTRAST:  196m ISOVUE-300 IOPAMIDOL (ISOVUE-300) INJECTION 61% COMPARISON:  01/03/2016 PET-CT. 09/27/2015 chest CT. 06/30/2004 unenhanced CT abdomen/ pelvis. FINDINGS: CT CHEST FINDINGS Cardiovascular: Normal heart size. No significant pericardial fluid/thickening. Left anterior descending coronary atherosclerosis. Atherosclerotic nonaneurysmal thoracic aorta. Normal caliber pulmonary arteries. No central pulmonary emboli. Mediastinum/Nodes: No discrete thyroid nodules. Unremarkable esophagus. Bulky right axillary adenopathy measuring up to the 3.1 cm (series 3/ image 16), previously 3.4 cm on 01/03/2016, mildly decreased. Mild right retropectoral adenopathy measuring up to 1.2 cm (series 3/image 12), previously 1.6 cm, mildly decreased. Right supraclavicular 0.8 cm node (series 3/image 4), decreased from 1.5 cm. Mild left axillary lymphadenopathy measuring up to 1.3 cm (series 3/image 18), previously 0.8 cm,  increased. No pathologically enlarged mediastinal or hilar nodes. Lungs/Pleura: No pneumothorax. No pleural effusion. No acute consolidative airspace disease, lung masses or significant pulmonary nodules. Musculoskeletal: No aggressive appearing focal osseous lesions. Moderate thoracic spondylosis. Prominent asymmetric skin thickening throughout the right breast with asymmetric thickening of the right breast fibroglandular tissues is not appreciably changed. CT ABDOMEN PELVIS FINDINGS Hepatobiliary: Normal liver size. Hypodense 0.5 cm posterior right liver lobe lesion (series 3/ image 44), too small to characterize, not definitely changed since 01/03/2016 unenhanced CT images. No additional liver lesions. Normal gallbladder with no radiopaque cholelithiasis. No biliary ductal dilatation. Pancreas: Normal, with no mass or duct dilation. Spleen: Normal size. No mass. Adrenals/Urinary Tract: No discrete adrenal nodules. Two hypodense subcentimeter renal cortical lesions in the right kidney are too small to characterize. Otherwise normal kidneys, with no hydronephrosis. Normal bladder. Stomach/Bowel: Tiny hiatal hernia. Otherwise grossly normal stomach. Normal caliber small bowel with no small bowel wall thickening. Normal appendix. Normal large bowel with no diverticulosis, large bowel wall thickening or pericolonic fat stranding. Vascular/Lymphatic: Atherosclerotic nonaneurysmal abdominal aorta. Patent portal, splenic and renal veins . No pathologically enlarged lymph nodes in the abdomen or pelvis. Reproductive: Stable mildly enlarged myomatous anteverted uterus with  scattered coarse calcifications in the uterus. No adnexal mass. Other: No pneumoperitoneum, ascites or focal fluid collection. Musculoskeletal: No aggressive appearing focal osseous lesions. Moderate lumbar spondylosis. IMPRESSION: 1. Bulky right axillary and mild right retropectoral and right supraclavicular lymphadenopathy is mildly decreased since  01/03/2016 PET-CT. 2. Left axillary lymphadenopathy is mildly increased. 3. Diffuse asymmetric thickening of the skin and fibroglandular soft tissues of the right breast is not appreciably changed. 4. No definite additional sites of metastatic disease in the chest, abdomen or pelvis. 5. Subcentimeter right liver lobe lesion is too small to characterize and is probably stable. 6. Additional findings include aortic atherosclerosis, 1 vessel coronary atherosclerosis, tiny hiatal hernia and myomatous uterus. Electronically Signed   By: Ilona Sorrel M.D.   On: 04/25/2016 08:53   DIAGNOSTIC MAMMOGRAM OF RIGHT BREAST 12/27/2015 FINDINGS: CC and MLO views of the right breast were performed with tomosynthesis. The right breast demonstrates diffuse skin thickening and increased density. There is right axillary adenopathy. No focal mass is identified within the breast.  Mammographic images were processed with CAD.  On physical exam, there is palpable right axillary adenopathy including a 5 cm right axillary lymph node. The right breast and nipple are asymmetrically enlarged relative to the left.  Targeted ultrasound is performed, showing numerous enlarged right axillary lymph nodes, the largest measuring 5.3 x 4.2 x 2.6 cm.  IMPRESSION: Suspicious right axillary mass.  CT CHEST, ABDOMEN AND PELVIS WITH CONTRAST 04/24/2016 IMPRESSION: 1. Bulky right axillary and mild right retropectoral and right supraclavicular lymphadenopathy is mildly decreased since 01/03/2016 PET-CT. 2. Left axillary lymphadenopathy is mildly increased. 3. Diffuse asymmetric thickening of the skin and fibroglandular soft tissues of the right breast is not appreciably changed. 4. No definite additional sites of metastatic disease in the chest, abdomen or pelvis. 5. Subcentimeter right liver lobe lesion is too small to characterize and is probably stable. 6. Additional findings include aortic atherosclerosis, 1  vessel coronary atherosclerosis, tiny hiatal hernia and myomatous uterus.    ASSESSMENT & PLAN:  70 year old female, with past medical history of HTN, GERD, sleep apnea, presented with bulky right axillary adenopathy.  1. Primary cancer of right breast with metastasis to distant lymph nodes, invasive ductal carcinoma and DCIS, ER-/PR-/HER2-,  [id number], stage IV  -I previously reviewed her mammogram, ultrasound and a CT chest, and biopsy pathology findings with patient and her husband in details -I discussed her breast MRI, PET scan, and breast biopsy results in great details with patient and her family members  -Her breast MRI findings and breast biopsy confirmed primary breast cancer, triple negative. Unfortunately her cancer has metastasized to left axilla and cervical lymph nodes, which are distant metastasis.  -We previously reviewed the natural history of metastatic triple negative breast cancer, which is very aggressive, and her cancer is incurable at this stage  -Giving the metastatic disease, surgery up front is not indicated, I recommend systemic chemotherapy. -She has started weekly Taxol, tolerating well, no neuropathy or other side effects so far, we will continue -Her right axillary lymph node has gotten smaller since she started chemotherapy, corresponding to good clinical response -I reviewed her restaging CT scan findings, which showed steady improvement in the right-sided adenopathy, no other new metastasis. -She is clinically doing well, lab reviewed, we'll continue chemotherapy with weekly Taxol   2. ANEMIA in your blastic disease -She developed normocytic mild anemia in the past few months,  -Her anemia workup shows a normal folic acid and F68 level, increased ferritin, decreased serum  iron, TIBC and transferrin saturation. This is most consistent with anemia of chronic disease, secondary to her CKD and underlying malignancy. -I encourage her to continue oral iron  supplement   3. CKD stage III -Her GFR is in 40-50's  -No lab signs of tumor lysis -Possibly related to her underlying hypertension and diuretics  -Her Cr has much improved since she stopped her Lasix. -Avoid nephrotoxins, such as NSAIDs and IV CT contrast  4. HTN, GERD, obesity, sleep apnea -She'll continue follow-up with her primary care physician   PLAN -scan reviewed, stable disease overall -Continue weekly Taxol -I will see her back in 2 weeks.   I spent 20 minutes counseling the patient face to face. The total time spent in the appointment was 25 minute s and more than 50% was on counseling.     Truitt Merle, MD 04/29/2016

## 2016-04-30 ENCOUNTER — Other Ambulatory Visit (HOSPITAL_BASED_OUTPATIENT_CLINIC_OR_DEPARTMENT_OTHER): Payer: Commercial Managed Care - HMO

## 2016-04-30 ENCOUNTER — Ambulatory Visit (HOSPITAL_BASED_OUTPATIENT_CLINIC_OR_DEPARTMENT_OTHER): Payer: Commercial Managed Care - HMO

## 2016-04-30 ENCOUNTER — Ambulatory Visit (HOSPITAL_BASED_OUTPATIENT_CLINIC_OR_DEPARTMENT_OTHER): Payer: Commercial Managed Care - HMO | Admitting: Hematology

## 2016-04-30 ENCOUNTER — Telehealth: Payer: Self-pay | Admitting: Hematology

## 2016-04-30 VITALS — BP 151/79 | HR 80 | Temp 97.7°F | Resp 16 | Ht 64.0 in | Wt 188.1 lb

## 2016-04-30 DIAGNOSIS — Z5111 Encounter for antineoplastic chemotherapy: Secondary | ICD-10-CM | POA: Diagnosis not present

## 2016-04-30 DIAGNOSIS — N183 Chronic kidney disease, stage 3 unspecified: Secondary | ICD-10-CM

## 2016-04-30 DIAGNOSIS — C50911 Malignant neoplasm of unspecified site of right female breast: Secondary | ICD-10-CM

## 2016-04-30 DIAGNOSIS — C773 Secondary and unspecified malignant neoplasm of axilla and upper limb lymph nodes: Secondary | ICD-10-CM

## 2016-04-30 DIAGNOSIS — G4733 Obstructive sleep apnea (adult) (pediatric): Secondary | ICD-10-CM

## 2016-04-30 DIAGNOSIS — K219 Gastro-esophageal reflux disease without esophagitis: Secondary | ICD-10-CM

## 2016-04-30 DIAGNOSIS — I10 Essential (primary) hypertension: Secondary | ICD-10-CM

## 2016-04-30 DIAGNOSIS — E669 Obesity, unspecified: Secondary | ICD-10-CM

## 2016-04-30 DIAGNOSIS — D630 Anemia in neoplastic disease: Secondary | ICD-10-CM | POA: Diagnosis not present

## 2016-04-30 DIAGNOSIS — C770 Secondary and unspecified malignant neoplasm of lymph nodes of head, face and neck: Secondary | ICD-10-CM | POA: Diagnosis not present

## 2016-04-30 DIAGNOSIS — C778 Secondary and unspecified malignant neoplasm of lymph nodes of multiple regions: Secondary | ICD-10-CM | POA: Diagnosis not present

## 2016-04-30 LAB — CBC WITH DIFFERENTIAL/PLATELET
BASO%: 1.9 % (ref 0.0–2.0)
Basophils Absolute: 0.1 10*3/uL (ref 0.0–0.1)
EOS%: 2.2 % (ref 0.0–7.0)
Eosinophils Absolute: 0.1 10*3/uL (ref 0.0–0.5)
HEMATOCRIT: 37.1 % (ref 34.8–46.6)
HGB: 11.7 g/dL (ref 11.6–15.9)
LYMPH#: 0.7 10*3/uL — AB (ref 0.9–3.3)
LYMPH%: 19.9 % (ref 14.0–49.7)
MCH: 28 pg (ref 25.1–34.0)
MCHC: 31.6 g/dL (ref 31.5–36.0)
MCV: 88.7 fL (ref 79.5–101.0)
MONO#: 0.3 10*3/uL (ref 0.1–0.9)
MONO%: 8.3 % (ref 0.0–14.0)
NEUT%: 67.7 % (ref 38.4–76.8)
NEUTROS ABS: 2.5 10*3/uL (ref 1.5–6.5)
PLATELETS: 262 10*3/uL (ref 145–400)
RBC: 4.18 10*6/uL (ref 3.70–5.45)
RDW: 20.4 % — AB (ref 11.2–14.5)
WBC: 3.8 10*3/uL — AB (ref 3.9–10.3)

## 2016-04-30 LAB — COMPREHENSIVE METABOLIC PANEL
ALT: 18 U/L (ref 0–55)
ANION GAP: 10 meq/L (ref 3–11)
AST: 18 U/L (ref 5–34)
Albumin: 3.8 g/dL (ref 3.5–5.0)
Alkaline Phosphatase: 56 U/L (ref 40–150)
BILIRUBIN TOTAL: 0.37 mg/dL (ref 0.20–1.20)
BUN: 28.3 mg/dL — AB (ref 7.0–26.0)
CALCIUM: 9.4 mg/dL (ref 8.4–10.4)
CO2: 22 meq/L (ref 22–29)
CREATININE: 1.2 mg/dL — AB (ref 0.6–1.1)
Chloride: 108 mEq/L (ref 98–109)
EGFR: 55 mL/min/{1.73_m2} — ABNORMAL LOW (ref >90)
Glucose: 85 mg/dl (ref 70–140)
Potassium: 4 mEq/L (ref 3.5–5.1)
Sodium: 140 mEq/L (ref 136–145)
TOTAL PROTEIN: 7.9 g/dL (ref 6.4–8.3)

## 2016-04-30 LAB — TECHNOLOGIST REVIEW

## 2016-04-30 MED ORDER — DEXAMETHASONE SODIUM PHOSPHATE 10 MG/ML IJ SOLN
10.0000 mg | Freq: Once | INTRAMUSCULAR | Status: AC
  Administered 2016-04-30: 10 mg via INTRAVENOUS

## 2016-04-30 MED ORDER — SODIUM CHLORIDE 0.9 % IV SOLN
Freq: Once | INTRAVENOUS | Status: AC
  Administered 2016-04-30: 15:00:00 via INTRAVENOUS

## 2016-04-30 MED ORDER — DIPHENHYDRAMINE HCL 50 MG/ML IJ SOLN
INTRAMUSCULAR | Status: AC
  Filled 2016-04-30: qty 1

## 2016-04-30 MED ORDER — PACLITAXEL CHEMO INJECTION 300 MG/50ML
80.0000 mg/m2 | Freq: Once | INTRAVENOUS | Status: AC
  Administered 2016-04-30: 156 mg via INTRAVENOUS
  Filled 2016-04-30: qty 26

## 2016-04-30 MED ORDER — DEXAMETHASONE SODIUM PHOSPHATE 10 MG/ML IJ SOLN
INTRAMUSCULAR | Status: AC
  Filled 2016-04-30: qty 1

## 2016-04-30 MED ORDER — FAMOTIDINE IN NACL 20-0.9 MG/50ML-% IV SOLN
INTRAVENOUS | Status: AC
  Filled 2016-04-30: qty 50

## 2016-04-30 MED ORDER — DIPHENHYDRAMINE HCL 50 MG/ML IJ SOLN
25.0000 mg | Freq: Once | INTRAMUSCULAR | Status: AC
  Administered 2016-04-30: 25 mg via INTRAVENOUS

## 2016-04-30 MED ORDER — FAMOTIDINE IN NACL 20-0.9 MG/50ML-% IV SOLN
20.0000 mg | Freq: Once | INTRAVENOUS | Status: AC
  Administered 2016-04-30: 20 mg via INTRAVENOUS

## 2016-04-30 NOTE — Telephone Encounter (Signed)
Message sent to chemo scheduler to be added. Appointments scheduled per 04/30/16 los.  AVS report and appointment schedule was given to patient, per 04/30/16 los.

## 2016-04-30 NOTE — Patient Instructions (Signed)
[facility] Discharge Instructions for Patients Receiving Chemotherapy  Today you received the following chemotherapy agents Taxol   To help prevent nausea and vomiting after your treatment, we encourage you to take your nausea medication as directed.   If you develop nausea and vomiting that is not controlled by your nausea medication, call the clinic.   BELOW ARE SYMPTOMS THAT SHOULD BE REPORTED IMMEDIATELY:  *FEVER GREATER THAN 100.5 F  *CHILLS WITH OR WITHOUT FEVER  NAUSEA AND VOMITING THAT IS NOT CONTROLLED WITH YOUR NAUSEA MEDICATION  *UNUSUAL SHORTNESS OF BREATH  *UNUSUAL BRUISING OR BLEEDING  TENDERNESS IN MOUTH AND THROAT WITH OR WITHOUT PRESENCE OF ULCERS  *URINARY PROBLEMS  *BOWEL PROBLEMS  UNUSUAL RASH Items with * indicate a potential emergency and should be followed up as soon as possible.  Feel free to call the clinic you have any questions or concerns. The clinic phone number is [phone].  Please show the CHEMO ALERT CARD at check-in to the Emergency Department and triage nurse.   

## 2016-05-01 ENCOUNTER — Telehealth: Payer: Self-pay | Admitting: *Deleted

## 2016-05-01 NOTE — Telephone Encounter (Signed)
Per LOS I have scheduled appts and notified the acheduler

## 2016-05-01 NOTE — Telephone Encounter (Signed)
Notified the scheduler of time between appts on 11/28

## 2016-05-03 ENCOUNTER — Other Ambulatory Visit: Payer: Self-pay

## 2016-05-03 MED ORDER — METOPROLOL TARTRATE 25 MG PO TABS: 25.0000 mg | ORAL_TABLET | Freq: Every day | ORAL | 0 refills | Status: DC

## 2016-05-05 ENCOUNTER — Encounter: Payer: Self-pay | Admitting: Hematology

## 2016-05-07 ENCOUNTER — Other Ambulatory Visit: Payer: Self-pay | Admitting: Hematology

## 2016-05-07 ENCOUNTER — Other Ambulatory Visit (HOSPITAL_BASED_OUTPATIENT_CLINIC_OR_DEPARTMENT_OTHER): Payer: Commercial Managed Care - HMO

## 2016-05-07 ENCOUNTER — Ambulatory Visit (HOSPITAL_BASED_OUTPATIENT_CLINIC_OR_DEPARTMENT_OTHER): Payer: Commercial Managed Care - HMO

## 2016-05-07 VITALS — BP 169/79 | HR 76 | Temp 98.3°F | Resp 18

## 2016-05-07 DIAGNOSIS — C50911 Malignant neoplasm of unspecified site of right female breast: Secondary | ICD-10-CM | POA: Diagnosis not present

## 2016-05-07 DIAGNOSIS — C773 Secondary and unspecified malignant neoplasm of axilla and upper limb lymph nodes: Secondary | ICD-10-CM | POA: Diagnosis not present

## 2016-05-07 DIAGNOSIS — Z5111 Encounter for antineoplastic chemotherapy: Secondary | ICD-10-CM | POA: Diagnosis not present

## 2016-05-07 LAB — CBC WITH DIFFERENTIAL/PLATELET
BASO%: 1.6 % (ref 0.0–2.0)
BASOS ABS: 0.1 10*3/uL (ref 0.0–0.1)
EOS ABS: 0.1 10*3/uL (ref 0.0–0.5)
EOS%: 3.6 % (ref 0.0–7.0)
HCT: 33.9 % — ABNORMAL LOW (ref 34.8–46.6)
HEMOGLOBIN: 11 g/dL — AB (ref 11.6–15.9)
LYMPH%: 17.8 % (ref 14.0–49.7)
MCH: 28.7 pg (ref 25.1–34.0)
MCHC: 32.4 g/dL (ref 31.5–36.0)
MCV: 88.5 fL (ref 79.5–101.0)
MONO#: 0.4 10*3/uL (ref 0.1–0.9)
MONO%: 10.1 % (ref 0.0–14.0)
NEUT#: 2.4 10*3/uL (ref 1.5–6.5)
NEUT%: 66.9 % (ref 38.4–76.8)
Platelets: 248 10*3/uL (ref 145–400)
RBC: 3.83 10*6/uL (ref 3.70–5.45)
RDW: 17.8 % — AB (ref 11.2–14.5)
WBC: 3.7 10*3/uL — ABNORMAL LOW (ref 3.9–10.3)
lymph#: 0.7 10*3/uL — ABNORMAL LOW (ref 0.9–3.3)

## 2016-05-07 LAB — COMPREHENSIVE METABOLIC PANEL
ALBUMIN: 3.4 g/dL — AB (ref 3.5–5.0)
ALK PHOS: 51 U/L (ref 40–150)
ALT: 15 U/L (ref 0–55)
AST: 13 U/L (ref 5–34)
Anion Gap: 9 mEq/L (ref 3–11)
BUN: 26.7 mg/dL — ABNORMAL HIGH (ref 7.0–26.0)
CALCIUM: 8.8 mg/dL (ref 8.4–10.4)
CHLORIDE: 108 meq/L (ref 98–109)
CO2: 21 mEq/L — ABNORMAL LOW (ref 22–29)
Creatinine: 1.7 mg/dL — ABNORMAL HIGH (ref 0.6–1.1)
EGFR: 34 mL/min/{1.73_m2} — AB (ref >90)
GLUCOSE: 86 mg/dL (ref 70–140)
POTASSIUM: 4 meq/L (ref 3.5–5.1)
SODIUM: 139 meq/L (ref 136–145)
Total Bilirubin: 0.24 mg/dL (ref 0.20–1.20)
Total Protein: 7 g/dL (ref 6.4–8.3)

## 2016-05-07 MED ORDER — DEXAMETHASONE SODIUM PHOSPHATE 10 MG/ML IJ SOLN
10.0000 mg | Freq: Once | INTRAMUSCULAR | Status: AC
  Administered 2016-05-07: 10 mg via INTRAVENOUS

## 2016-05-07 MED ORDER — SODIUM CHLORIDE 0.9 % IV SOLN: Freq: Once | INTRAVENOUS | Status: DC

## 2016-05-07 MED ORDER — DEXTROSE 5 % IV SOLN
80.0000 mg/m2 | Freq: Once | INTRAVENOUS | Status: AC
  Administered 2016-05-07: 156 mg via INTRAVENOUS
  Filled 2016-05-07: qty 26

## 2016-05-07 MED ORDER — DIPHENHYDRAMINE HCL 50 MG/ML IJ SOLN
25.0000 mg | Freq: Once | INTRAMUSCULAR | Status: AC
  Administered 2016-05-07: 25 mg via INTRAVENOUS

## 2016-05-07 MED ORDER — DEXAMETHASONE SODIUM PHOSPHATE 10 MG/ML IJ SOLN
INTRAMUSCULAR | Status: AC
  Filled 2016-05-07: qty 1

## 2016-05-07 MED ORDER — SODIUM CHLORIDE 0.9 % IV SOLN
750.0000 mL | Freq: Once | INTRAVENOUS | Status: AC
  Administered 2016-05-07: 15:00:00 via INTRAVENOUS

## 2016-05-07 MED ORDER — FAMOTIDINE IN NACL 20-0.9 MG/50ML-% IV SOLN
20.0000 mg | Freq: Once | INTRAVENOUS | Status: AC
  Administered 2016-05-07: 20 mg via INTRAVENOUS

## 2016-05-07 MED ORDER — DIPHENHYDRAMINE HCL 50 MG/ML IJ SOLN
INTRAMUSCULAR | Status: AC
Start: 2016-05-07 — End: 2016-05-07
  Filled 2016-05-07: qty 1

## 2016-05-07 MED ORDER — FAMOTIDINE IN NACL 20-0.9 MG/50ML-% IV SOLN
INTRAVENOUS | Status: AC
  Filled 2016-05-07: qty 50

## 2016-05-07 MED ORDER — SODIUM CHLORIDE 0.9 % IV SOLN: INTRAVENOUS | Status: AC

## 2016-05-07 NOTE — Progress Notes (Signed)
OK to treat today w/ Creat 1.7.  Give additional 750 cc of NS IV over 1.5 hrs before and after Taxol.

## 2016-05-07 NOTE — Patient Instructions (Addendum)
[facility] Discharge Instructions for Patients Receiving Chemotherapy  Today you received the following chemotherapy agents; Taxol.   To help prevent nausea and vomiting after your treatment, we encourage you to take your nausea medication as directed.    If you develop nausea and vomiting that is not controlled by your nausea medication, call the clinic.   BELOW ARE SYMPTOMS THAT SHOULD BE REPORTED IMMEDIATELY:  *FEVER GREATER THAN 100.5 F  *CHILLS WITH OR WITHOUT FEVER  NAUSEA AND VOMITING THAT IS NOT CONTROLLED WITH YOUR NAUSEA MEDICATION  *UNUSUAL SHORTNESS OF BREATH  *UNUSUAL BRUISING OR BLEEDING  TENDERNESS IN MOUTH AND THROAT WITH OR WITHOUT PRESENCE OF ULCERS  *URINARY PROBLEMS  *BOWEL PROBLEMS  UNUSUAL RASH Items with * indicate a potential emergency and should be followed up as soon as possible.  Feel free to call the clinic you have any questions or concerns. The clinic phone number is [phone].  Please show the [place] at check-in to the Emergency Department and triage nurse.   Dehydration, Adult Dehydration is a condition in which there is not enough fluid or water in the body. This happens when you lose more fluids than you take in. Important organs, such as the kidneys, brain, and heart, cannot function without a proper amount of fluids. Any loss of fluids from the body can lead to dehydration. Dehydration can range from mild to severe. This condition should be treated right away to prevent it from becoming severe. What are the causes? This condition may be caused by:  Vomiting.  Diarrhea.  Excessive sweating, such as from heat exposure or exercise.  Not drinking enough fluid, especially:  When ill.  While doing activity that requires a lot of energy.  Excessive urination.  Fever.  Infection.  Certain medicines, such as medicines that cause the body to lose excess fluid (diuretics).  Inability to access safe  drinking water.  Reduced physical ability to get adequate water and food. What increases the risk? This condition is more likely to develop in people:  Who have a poorly controlled long-term (chronic) illness, such as diabetes, heart disease, or kidney disease.  Who are age 69 or older.  Who are disabled.  Who live in a place with high altitude.  Who play endurance sports. What are the signs or symptoms? Symptoms of mild dehydration may include:  Thirst.  Dry lips.  Slightly dry mouth.  Dry, warm skin.  Dizziness. Symptoms of moderate dehydration may include:  Very dry mouth.  Muscle cramps.  Dark urine. Urine may be the color of tea.  Decreased urine production.  Decreased tear production.  Heartbeat that is irregular or faster than normal (palpitations).  Headache.  Light-headedness, especially when you stand up from a sitting position.  Fainting (syncope). Symptoms of severe dehydration may include:  Changes in skin, such as:  Cold and clammy skin.  Blotchy (mottled) or pale skin.  Skin that does not quickly return to normal after being lightly pinched and released (poor skin turgor).  Changes in body fluids, such as:  Extreme thirst.  No tear production.  Inability to sweat when body temperature is high, such as in hot weather.  Very little urine production.  Changes in vital signs, such as:  Weak pulse.  Pulse that is more than 100 beats a minute when sitting still.  Rapid breathing.  Low blood pressure.  Other changes, such as:  Sunken eyes.  Cold hands and feet.  Confusion.  Lack of  energy (lethargy).  Difficulty waking up from sleep.  Short-term weight loss.  Unconsciousness. How is this diagnosed? This condition is diagnosed based on your symptoms and a physical exam. Blood and urine tests may be done to help confirm the diagnosis. How is this treated? Treatment for this condition depends on the severity. Mild or  moderate dehydration can often be treated at home. Treatment should be started right away. Do not wait until dehydration becomes severe. Severe dehydration is an emergency and it needs to be treated in a hospital. Treatment for mild dehydration may include:  Drinking more fluids.  Replacing salts and minerals in your blood (electrolytes) that you may have lost. Treatment for moderate dehydration may include:  Drinking an oral rehydration solution (ORS). This is a drink that helps you replace fluids and electrolytes (rehydrate). It can be found at pharmacies and retail stores. Treatment for severe dehydration may include:  Receiving fluids through an IV tube.  Receiving an electrolyte solution through a feeding tube that is passed through your nose and into your stomach (nasogastric tube, or NG tube).  Correcting any abnormalities in electrolytes.  Treating the underlying cause of dehydration. Follow these instructions at home:  If directed by your health care provider, drink an ORS:  Make an ORS by following instructions on the package.  Start by drinking small amounts, about  cup (120 mL) every 5-10 minutes.  Slowly increase how much you drink until you have taken the amount recommended by your health care provider.  Drink enough clear fluid to keep your urine clear or pale yellow. If you were told to drink an ORS, finish the ORS first, then start slowly drinking other clear fluids. Drink fluids such as:  Water. Do not drink only water. Doing that can lead to having too little salt (sodium) in the body (hyponatremia).  Ice chips.  Fruit juice that you have added water to (diluted fruit juice).  Low-calorie sports drinks.  Avoid:  Alcohol.  Drinks that contain a lot of sugar. These include high-calorie sports drinks, fruit juice that is not diluted, and soda.  Caffeine.  Foods that are greasy or contain a lot of fat or sugar.  Take over-the-counter and prescription  medicines only as told by your health care provider.  Do not take sodium tablets. This can lead to having too much sodium in the body (hypernatremia).  Eat foods that contain a healthy balance of electrolytes, such as bananas, oranges, potatoes, tomatoes, and spinach.  Keep all follow-up visits as told by your health care provider. This is important. Contact a health care provider if:  You have abdominal pain that:  Gets worse.  Stays in one area (localizes).  You have a rash.  You have a stiff neck.  You are more irritable than usual.  You are sleepier or more difficult to wake up than usual.  You feel weak or dizzy.  You feel very thirsty.  You have urinated only a small amount of very dark urine over 6-8 hours. Get help right away if:  You have symptoms of severe dehydration.  You cannot drink fluids without vomiting.  Your symptoms get worse with treatment.  You have a fever.  You have a severe headache.  You have vomiting or diarrhea that:  Gets worse.  Does not go away.  You have blood or green matter (bile) in your vomit.  You have blood in your stool. This may cause stool to look black and tarry.  You have  not urinated in 6-8 hours.  You faint.  Your heart rate while sitting still is over 100 beats a minute.  You have trouble breathing. This information is not intended to replace advice given to you by your health care provider. Make sure you discuss any questions you have with your health care provider. Document Released: 06/03/2005 Document Revised: 12/29/2015 Document Reviewed: 07/28/2015 Elsevier Interactive Patient Education  2017 [employer].

## 2016-05-14 ENCOUNTER — Telehealth: Payer: Self-pay | Admitting: *Deleted

## 2016-05-14 ENCOUNTER — Ambulatory Visit (HOSPITAL_BASED_OUTPATIENT_CLINIC_OR_DEPARTMENT_OTHER): Payer: Commercial Managed Care - HMO

## 2016-05-14 ENCOUNTER — Ambulatory Visit (HOSPITAL_BASED_OUTPATIENT_CLINIC_OR_DEPARTMENT_OTHER): Payer: Commercial Managed Care - HMO | Admitting: Hematology

## 2016-05-14 ENCOUNTER — Encounter: Payer: Self-pay | Admitting: Hematology

## 2016-05-14 ENCOUNTER — Telehealth: Payer: Self-pay | Admitting: Hematology

## 2016-05-14 ENCOUNTER — Other Ambulatory Visit (HOSPITAL_BASED_OUTPATIENT_CLINIC_OR_DEPARTMENT_OTHER): Payer: Commercial Managed Care - HMO

## 2016-05-14 VITALS — BP 135/60 | HR 72 | Temp 98.1°F | Resp 17 | Ht 64.0 in | Wt 189.2 lb

## 2016-05-14 DIAGNOSIS — I10 Essential (primary) hypertension: Secondary | ICD-10-CM

## 2016-05-14 DIAGNOSIS — C50911 Malignant neoplasm of unspecified site of right female breast: Secondary | ICD-10-CM

## 2016-05-14 DIAGNOSIS — C773 Secondary and unspecified malignant neoplasm of axilla and upper limb lymph nodes: Secondary | ICD-10-CM

## 2016-05-14 DIAGNOSIS — Z5111 Encounter for antineoplastic chemotherapy: Secondary | ICD-10-CM

## 2016-05-14 DIAGNOSIS — N183 Chronic kidney disease, stage 3 unspecified: Secondary | ICD-10-CM

## 2016-05-14 DIAGNOSIS — E669 Obesity, unspecified: Secondary | ICD-10-CM

## 2016-05-14 DIAGNOSIS — Z171 Estrogen receptor negative status [ER-]: Secondary | ICD-10-CM

## 2016-05-14 DIAGNOSIS — G4733 Obstructive sleep apnea (adult) (pediatric): Secondary | ICD-10-CM

## 2016-05-14 DIAGNOSIS — D630 Anemia in neoplastic disease: Secondary | ICD-10-CM

## 2016-05-14 LAB — CBC WITH DIFFERENTIAL/PLATELET
BASO%: 3.3 % — AB (ref 0.0–2.0)
Basophils Absolute: 0.1 10*3/uL (ref 0.0–0.1)
EOS%: 2.2 % (ref 0.0–7.0)
Eosinophils Absolute: 0.1 10*3/uL (ref 0.0–0.5)
HCT: 36.4 % (ref 34.8–46.6)
HGB: 11.8 g/dL (ref 11.6–15.9)
LYMPH%: 16.9 % (ref 14.0–49.7)
MCH: 28.6 pg (ref 25.1–34.0)
MCHC: 32.3 g/dL (ref 31.5–36.0)
MCV: 88.7 fL (ref 79.5–101.0)
MONO#: 0.3 10*3/uL (ref 0.1–0.9)
MONO%: 8.8 % (ref 0.0–14.0)
NEUT#: 2.5 10*3/uL (ref 1.5–6.5)
NEUT%: 68.8 % (ref 38.4–76.8)
PLATELETS: 275 10*3/uL (ref 145–400)
RBC: 4.11 10*6/uL (ref 3.70–5.45)
RDW: 18.5 % — ABNORMAL HIGH (ref 11.2–14.5)
WBC: 3.6 10*3/uL — ABNORMAL LOW (ref 3.9–10.3)
lymph#: 0.6 10*3/uL — ABNORMAL LOW (ref 0.9–3.3)

## 2016-05-14 LAB — COMPREHENSIVE METABOLIC PANEL
ALBUMIN: 3.6 g/dL (ref 3.5–5.0)
ALK PHOS: 51 U/L (ref 40–150)
ALT: 17 U/L (ref 0–55)
ANION GAP: 8 meq/L (ref 3–11)
AST: 14 U/L (ref 5–34)
BUN: 23.8 mg/dL (ref 7.0–26.0)
CALCIUM: 9.6 mg/dL (ref 8.4–10.4)
CHLORIDE: 107 meq/L (ref 98–109)
CO2: 26 mEq/L (ref 22–29)
Creatinine: 1.1 mg/dL (ref 0.6–1.1)
EGFR: 60 mL/min/{1.73_m2} — AB (ref >90)
Glucose: 90 mg/dl (ref 70–140)
POTASSIUM: 4.2 meq/L (ref 3.5–5.1)
Sodium: 142 mEq/L (ref 136–145)
Total Bilirubin: 0.51 mg/dL (ref 0.20–1.20)
Total Protein: 7.4 g/dL (ref 6.4–8.3)

## 2016-05-14 MED ORDER — FAMOTIDINE IN NACL 20-0.9 MG/50ML-% IV SOLN
20.0000 mg | Freq: Once | INTRAVENOUS | Status: AC
  Administered 2016-05-14: 20 mg via INTRAVENOUS

## 2016-05-14 MED ORDER — FAMOTIDINE IN NACL 20-0.9 MG/50ML-% IV SOLN
INTRAVENOUS | Status: AC
  Filled 2016-05-14: qty 50

## 2016-05-14 MED ORDER — DEXAMETHASONE SODIUM PHOSPHATE 10 MG/ML IJ SOLN
INTRAMUSCULAR | Status: AC
  Filled 2016-05-14: qty 1

## 2016-05-14 MED ORDER — SODIUM CHLORIDE 0.9 % IV SOLN
Freq: Once | INTRAVENOUS | Status: AC
  Administered 2016-05-14: 13:00:00 via INTRAVENOUS

## 2016-05-14 MED ORDER — PACLITAXEL CHEMO INJECTION 300 MG/50ML
80.0000 mg/m2 | Freq: Once | INTRAVENOUS | Status: AC
  Administered 2016-05-14: 156 mg via INTRAVENOUS
  Filled 2016-05-14: qty 26

## 2016-05-14 MED ORDER — DIPHENHYDRAMINE HCL 50 MG/ML IJ SOLN
25.0000 mg | Freq: Once | INTRAMUSCULAR | Status: AC
  Administered 2016-05-14: 25 mg via INTRAVENOUS

## 2016-05-14 MED ORDER — DEXAMETHASONE SODIUM PHOSPHATE 10 MG/ML IJ SOLN
10.0000 mg | Freq: Once | INTRAMUSCULAR | Status: AC
  Administered 2016-05-14: 10 mg via INTRAVENOUS

## 2016-05-14 MED ORDER — DIPHENHYDRAMINE HCL 50 MG/ML IJ SOLN
INTRAMUSCULAR | Status: AC
  Filled 2016-05-14: qty 1

## 2016-05-14 NOTE — Telephone Encounter (Signed)
Per LOS I have scheduled and notified the scheduler

## 2016-05-14 NOTE — Progress Notes (Signed)
[place]  [phone]) [phone] [phone]  Clinic Follow Up Note   Patient Care Team: [name], MD as PCP - General (Internal Medicine) 05/14/2016   CHIEF COMPLAINTS:  Follow up right breast cancer   Oncology History   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 [facility])   Staging form: Breast, AJCC 7th Edition   - Clinical stage from 11/30/2015: Stage IV (T4d, N3c, M1) - Signed by Truitt Merle, MD on 01/05/2016      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 [facility])   11/30/2015 Initial Diagnosis    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 [facility])      11/30/2015 Initial Biopsy    Right axilla lymph node biopsy showed metastatic poorly differentiated adenocarcinoma, IHC positive for CK AE1/AE3, CK7, E-cadherin, CDX-2, and focally very weak to equivocal staining for ER      12/21/2015 Imaging    Bilateral breast MRI showed extensive non-mass enhancement involving the entire right breast, right axillary, supraclavicular, and internal mammary adenopathy, new left axillary adenopathy      12/27/2015 Mammogram    Diagnostic mammogram and ultrasound of right breast and axilla showed diffuse skin thickening and increased density, no focal mass, numerous enlarged right axillary lymph node, the largest measuring 5.3cm.       12/27/2015 Initial Biopsy    Right breast core needle biopsy showed invasive ductal carcinoma, and DCIS, lymphovascular invasion identified      12/27/2015 Receptors her2    ER negative, PR negative, Ki-67 70%, HER-2 negative      01/03/2016 Imaging    PET scan showed diffuse asymmetric right breast hypermetabolic soft tissue density and skin thickening, hypermetabolic metastatic lymphadenopathy in bilateral axillary and subpectoral regions, right supraclavicular region and the right cervical level 5      01/10/2016 -  Chemotherapy    Weekly Taxol 80 mg/m, second cycle was postponed  to 8/22 due to pt's non-compliance       02/07/2016 Pathology Results    Left axillary lymph node biopsy showed metastatic poorly differentiated carcinoma, triple negative.       HISTORY OF PRESENTING ILLNESS (12/14/2015) :  [name] 70 y.o. 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 She is accompanied by her husband to the clinic today.  She noticed a lump at right axilla in 08/2015, no pain or other complains, she also report right breast swelling, no pain, skin erythema, nipple discharge or other complain. She otherwise feels well. She was initially seen at urgent care in March, then was referred to establish her care with prior care physician at [facility] internal medicine center. Her screening mammogram was negative in January 2017. She subsequently underwent CT chest on 09/27/2015 which showed enlarged and inflamed right breast, bulky right axillary lymph nodes. She was referred to breast Center for diagnostic mammogram and ultrasound of right breast, which was negative. She underwent ultrasound guided right axillary node biopsy on 12/05/2015, which reviewed port differentiated carcinoma. ER weakly focally positive.  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 She works independently. She lives with her husband. She denies any fever, night sweats, chills, or skin itchiness.  CURRENT THERAPY: chemotherapy with weekly Taxol, started on 01/10/2016, second cycle was delayed due to pt's non-compliance   INTERIM HISTORY:  Sidrah returns for follow up. She is accompanied by her daughter to clinic today. She had moderate fatigue for a few days after  chemotherapy last week, recovered well. She denies any significant pain, nausea, neuropathy, or other symptoms. She has good appetite and energy level.  MEDICAL HISTORY:  Past Medical History:  Diagnosis Date  . Arthritis   . GERD (gastroesophageal reflux disease)   . Hypertension   . Shortness of breath   . Sleep apnea   . Stroke [facility])     SURGICAL HISTORY: Past Surgical History:  Procedure Laterality Date  . NO PAST SURGERIES      SOCIAL HISTORY: Social History   Social History  . Marital status: Married    Spouse name: N/A  . Number of children: 4  . Years of education: 8 TH   Occupational History  . Not on file.   Social History Main Topics  . Smoking status: Former Smoker    Packs/day: 0.50    Years: 1.00    Types: Cigarettes    Start date: 06/17/1968    Quit date: 06/17/1973  . Smokeless tobacco: Never Used     Comment: Quit in 52s  . Alcohol use No     Comment: former drinker for approximately 10 years  . Drug use: No  . Sexual activity: Not on file   Other Topics Concern  . Not on file   Social History Narrative   Patient is married with 3 children still living and 1 deceased son.   Patient is right handed.   Patient has 8 th grade education.   Patient drinks 2-3 servings daily.    FAMILY HISTORY: Family History  Problem Relation Age of Onset  . Heart disease Mother   . Cancer Father 76    throat cancer  . Cancer Cousin     breast cancer    ALLERGIES:  has No Known Allergies.  MEDICATIONS:  Current Outpatient Prescriptions  Medication Sig Dispense Refill  . amLODipine (NORVASC) 10 MG tablet Take 1 tablet (10 mg total) by mouth daily. 90 tablet 3  . aspirin EC 81 MG tablet Take 1 tablet (81 mg total) by mouth daily. 90 tablet 3  . atorvastatin (LIPITOR) 20 MG tablet Take 1 tablet (20 mg total) by mouth daily. 90 tablet 3  . furosemide (LASIX) 20 MG tablet 20 mg daily.     . metoprolol tartrate (LOPRESSOR) 25 MG tablet Take 1 tablet (25 mg  total) by mouth daily. 90 tablet 0  . ondansetron (ZOFRAN) 8 MG tablet Take 1 tablet (8 mg total) by mouth 2 (two) times daily as needed (Nausea or vomiting). 30 tablet 1  . prochlorperazine (COMPAZINE) 10 MG tablet Take 1 tablet (10 mg total) by mouth every 6 (six) hours as needed (Nausea or vomiting). 30 tablet 1  . traMADol (ULTRAM) 50 MG tablet Take 1 tablet (50 mg total) by mouth every 8 (eight) hours as needed. 30 tablet 0   No current facility-administered medications for this visit.        REVIEW OF SYSTEMS:   Constitutional: Denies fevers, chills or abnormal night sweats Eyes: Denies blurriness of vision, double vision or watery eyes Ears, nose, mouth, throat, and face: Denies mucositis or  sore throat Respiratory: Denies cough, dyspnea or wheezes Cardiovascular: Denies palpitation, chest discomfort or lower extremity swelling Gastrointestinal:  Denies nausea, heartburn or change in bowel habits Skin: Denies abnormal skin rashes Lymphatics: Denies new lymphadenopathy or easy bruising Neurological:Denies numbness, tingling or new weaknesses Behavioral/Psych: Mood is stable, no new changes  All other systems were reviewed with the patient and are negative.  PHYSICAL EXAMINATION: ECOG PERFORMANCE STATUS: 1  Vitals:   05/14/16 1001  BP: 135/60  Pulse: 72  Resp: 17  Temp: 98.1 F (36.7 C)   Filed Weights   05/14/16 1001  Weight: 189 lb 3.2 oz (85.8 kg)    GENERAL:alert, no distress and comfortable SKIN: skin color, texture, turgor are normal, no rashes or significant lesions EYES: normal, conjunctiva are pink and non-injected, sclera clear OROPHARYNX:no exudate, no erythema and lips, buccal mucosa, and tongue normal  NECK: supple, thyroid normal size, non-tender, without nodularity LYMPH:  no palpable lymphadenopathy in the cervical, axillary or inguinal LUNGS: clear to auscultation and percussion with normal breathing effort HEART: regular rate & rhythm and no murmurs  and no lower extremity edema ABDOMEN:abdomen soft, non-tender and normal bowel sounds Musculoskeletal:no cyanosis of digits and no clubbing  PSYCH: alert & oriented x 3 with fluent speech NEURO: no focal motor/sensory deficits Breasts: Breast inspection showed them to be symmetrical with no nipple discharge. (+) Lymphedema of right breast, no significant skin erythema, palpation of the breasts showed no mass, there is a 3.0x3.5cm (initially 6X6.5cm) mass at the anterior right axilla, close to right chest wall of breast margin, no tenderness, left axilla was negative.   LABORATORY DATA:  I have reviewed the data as listed CBC Latest Ref Rng & Units 05/14/2016 05/07/2016 04/30/2016  WBC 3.9 - 10.3 10e3/uL 3.6(L) 3.7(L) 3.8(L)  Hemoglobin 11.6 - 15.9 g/dL 11.8 11.0(L) 11.7  Hematocrit 34.8 - 46.6 % 36.4 33.9(L) 37.1  Platelets 145 - 400 10e3/uL 275 248 262   CMP Latest Ref Rng & Units 05/14/2016 05/07/2016 04/30/2016  Glucose 70 - 140 mg/dl 90 86 85  BUN 7.0 - 26.0 mg/dL 23.8 26.7(H) 28.3(H)  Creatinine 0.6 - 1.1 mg/dL 1.1 1.7(H) 1.2(H)  Sodium 136 - 145 mEq/L 142 139 140  Potassium 3.5 - 5.1 mEq/L 4.2 4.0 4.0  Chloride 96 - 106 mmol/L - - -  CO2 22 - 29 mEq/L 26 21(L) 22  Calcium 8.4 - 10.4 mg/dL 9.6 8.8 9.4  Total Protein 6.4 - 8.3 g/dL 7.4 7.0 7.9  Total Bilirubin 0.20 - 1.20 mg/dL 0.51 0.24 0.37  Alkaline Phos 40 - 150 U/L 51 51 56  AST 5 - 34 U/L _0 ALT 0 - 55 U/L _1 CA27.29: 01/05/2016: 155.9 02/27/2016: 96.7 03/26/2016: 64.7 04/23/2016: 65.4   PATHOLOGY REPORT: Diagnosis 11/30/2015 Lymph node, needle/core biopsy, right breast/axilla - LYMPH NODE POSITIVE FOR METASTATIC POORLY DIFFERENTIATED CARCINOMA. - SEE COMMENT. Microscopic Comment Immunohistochemical stains are performed. The tumor is positive for cytokeratin AE1/AE3, cytokeratin 7 and E-cadherin. CDX-2 demonstrates weak positivity. Estrogen receptor demonstrates focal very weak to  equivocal staining. Gross cystic disease fluid protein, cytokeratin 20, TTF-1, Melan-A and S100 are all negative. The findings are consistent with a poorly differentiated carcinoma. Given the staining pattern, an upper gastrointestinal and pancreatobiliary primary source should be ruled out. In addition, given the axillary location of the biopsy and focal weak to equivocal estrogen receptor staining, ruling out a poorly differentiated carcinoma of breast primary source is also prudent. Lastly, a  gynecologic primary may be considered. Correlation with clinical and radiologic impression is essential. Dr. Vicente Males has seen this case in consultation with essential agreement of the above diagnosis and comment. The findings are called to the [facility] on 12/04/15. (RAH:gt, 12/04/15)  Diagnosis 12/27/2015 Breast, right, needle core biopsy, central - INVASIVE MAMMARY CARCINOMA. - MAMMARY CARCINOMA IN SITU. - LYMPH/VASCULAR INVASION IS IDENTIFIED. - SEE COMMENT. Microscopic Comment An E-cadherin stain will be performed to determine if the carcinoma is ductal or lobular in nature. The results of the stain will be reported in an addendum to follow. Although definitive grading of breast carcinoma is best done on excision, the features of the invasive tumor from the right central breast biopsy are compatible with a grade 3 breast carcinoma. Breast prognostic markers will be performed and reported in an addendum. Findings are called to the [place] on 12/28/2015. Dr. Lyndon Code has seen this case in consultation with agreement. (RH:kh 12-28-15) ADDENDUM: An E-cadherin immunohistochemical stain is performed which is positive in both the invasive and in situ components confirming the ductal nature of both (i.e. invasive ductal carcinoma with ductal carcinoma in situ). (RAH:gt, 12/29/15) PROGNOSTIC INDICATORS Results: IMMUNOHISTOCHEMICAL AND MORPHOMETRIC ANALYSIS PERFORMED  MANUALLY Estrogen Receptor: 0%, NEGATIVE Progesterone Receptor: 0%, NEGATIVE Proliferation Marker Ki67: 70% Results: HER2 - NEGATIVE RATIO OF HER2/CEP17 SIGNALS 1.13 AVERAGE HER2 COPY NUMBER PER CELL 1.75  Diagnosis 02/07/2016 Lymph node, needle/core biopsy, left axillary - METASTATIC POORLY DIFFERENTIATED CARCINOMA. Results: IMMUNOHISTOCHEMICAL AND MORPHOMETRIC ANALYSIS PERFORMED MANUALLY Estrogen Receptor: 0%, NEGATIVE Progesterone Receptor: 0%, NEGATIVE Results: HER2 - NEGATIVE RATIO OF HER2/CEP17 SIGNALS 1.21 AVERAGE HER2 COPY NUMBER PER CELL 2.05  RADIOGRAPHIC STUDIES: I have personally reviewed the radiological images as listed and agreed with the findings in the report. Ct Chest W Contrast  Result Date: 04/25/2016 CLINICAL DATA:  Metastatic right breast cancer diagnosed June 2017 presenting for restaging with ongoing chemotherapy. EXAM: CT CHEST, ABDOMEN, AND PELVIS WITH CONTRAST TECHNIQUE: Multidetector CT imaging of the chest, abdomen and pelvis was performed following the standard protocol during bolus administration of intravenous contrast. CONTRAST:  184m ISOVUE-300 IOPAMIDOL (ISOVUE-300) INJECTION 61% COMPARISON:  01/03/2016 PET-CT. 09/27/2015 chest CT. 06/30/2004 unenhanced CT abdomen/ pelvis. FINDINGS: CT CHEST FINDINGS Cardiovascular: Normal heart size. No significant pericardial fluid/thickening. Left anterior descending coronary atherosclerosis. Atherosclerotic nonaneurysmal thoracic aorta. Normal caliber pulmonary arteries. No central pulmonary emboli. Mediastinum/Nodes: No discrete thyroid nodules. Unremarkable esophagus. Bulky right axillary adenopathy measuring up to the 3.1 cm (series 3/ image 16), previously 3.4 cm on 01/03/2016, mildly decreased. Mild right retropectoral adenopathy measuring up to 1.2 cm (series 3/image 12), previously 1.6 cm, mildly decreased. Right supraclavicular 0.8 cm node (series 3/image 4), decreased from 1.5 cm. Mild left axillary  lymphadenopathy measuring up to 1.3 cm (series 3/image 18), previously 0.8 cm, increased. No pathologically enlarged mediastinal or hilar nodes. Lungs/Pleura: No pneumothorax. No pleural effusion. No acute consolidative airspace disease, lung masses or significant pulmonary nodules. Musculoskeletal: No aggressive appearing focal osseous lesions. Moderate thoracic spondylosis. Prominent asymmetric skin thickening throughout the right breast with asymmetric thickening of the right breast fibroglandular tissues is not appreciably changed. CT ABDOMEN PELVIS FINDINGS Hepatobiliary: Normal liver size. Hypodense 0.5 cm posterior right liver lobe lesion (series 3/ image 44), too small to characterize, not definitely changed since 01/03/2016 unenhanced CT images. No additional liver lesions. Normal gallbladder with no radiopaque cholelithiasis. No biliary ductal dilatation. Pancreas: Normal, with no mass or duct dilation. Spleen: Normal size. No mass. Adrenals/Urinary Tract: No  discrete adrenal nodules. Two hypodense subcentimeter renal cortical lesions in the right kidney are too small to characterize. Otherwise normal kidneys, with no hydronephrosis. Normal bladder. Stomach/Bowel: Tiny hiatal hernia. Otherwise grossly normal stomach. Normal caliber small bowel with no small bowel wall thickening. Normal appendix. Normal large bowel with no diverticulosis, large bowel wall thickening or pericolonic fat stranding. Vascular/Lymphatic: Atherosclerotic nonaneurysmal abdominal aorta. Patent portal, splenic and renal veins . No pathologically enlarged lymph nodes in the abdomen or pelvis. Reproductive: Stable mildly enlarged myomatous anteverted uterus with scattered coarse calcifications in the uterus. No adnexal mass. Other: No pneumoperitoneum, ascites or focal fluid collection. Musculoskeletal: No aggressive appearing focal osseous lesions. Moderate lumbar spondylosis. IMPRESSION: 1. Bulky right axillary and mild right  retropectoral and right supraclavicular lymphadenopathy is mildly decreased since 01/03/2016 PET-CT. 2. Left axillary lymphadenopathy is mildly increased. 3. Diffuse asymmetric thickening of the skin and fibroglandular soft tissues of the right breast is not appreciably changed. 4. No definite additional sites of metastatic disease in the chest, abdomen or pelvis. 5. Subcentimeter right liver lobe lesion is too small to characterize and is probably stable. 6. Additional findings include aortic atherosclerosis, 1 vessel coronary atherosclerosis, tiny hiatal hernia and myomatous uterus. Electronically Signed   By: Ilona Sorrel M.D.   On: 04/25/2016 08:53   Ct Abdomen Pelvis W Contrast  Result Date: 04/25/2016 CLINICAL DATA:  Metastatic right breast cancer diagnosed June 2017 presenting for restaging with ongoing chemotherapy. EXAM: CT CHEST, ABDOMEN, AND PELVIS WITH CONTRAST TECHNIQUE: Multidetector CT imaging of the chest, abdomen and pelvis was performed following the standard protocol during bolus administration of intravenous contrast. CONTRAST:  158m ISOVUE-300 IOPAMIDOL (ISOVUE-300) INJECTION 61% COMPARISON:  01/03/2016 PET-CT. 09/27/2015 chest CT. 06/30/2004 unenhanced CT abdomen/ pelvis. FINDINGS: CT CHEST FINDINGS Cardiovascular: Normal heart size. No significant pericardial fluid/thickening. Left anterior descending coronary atherosclerosis. Atherosclerotic nonaneurysmal thoracic aorta. Normal caliber pulmonary arteries. No central pulmonary emboli. Mediastinum/Nodes: No discrete thyroid nodules. Unremarkable esophagus. Bulky right axillary adenopathy measuring up to the 3.1 cm (series 3/ image 16), previously 3.4 cm on 01/03/2016, mildly decreased. Mild right retropectoral adenopathy measuring up to 1.2 cm (series 3/image 12), previously 1.6 cm, mildly decreased. Right supraclavicular 0.8 cm node (series 3/image 4), decreased from 1.5 cm. Mild left axillary lymphadenopathy measuring up to 1.3 cm  (series 3/image 18), previously 0.8 cm, increased. No pathologically enlarged mediastinal or hilar nodes. Lungs/Pleura: No pneumothorax. No pleural effusion. No acute consolidative airspace disease, lung masses or significant pulmonary nodules. Musculoskeletal: No aggressive appearing focal osseous lesions. Moderate thoracic spondylosis. Prominent asymmetric skin thickening throughout the right breast with asymmetric thickening of the right breast fibroglandular tissues is not appreciably changed. CT ABDOMEN PELVIS FINDINGS Hepatobiliary: Normal liver size. Hypodense 0.5 cm posterior right liver lobe lesion (series 3/ image 44), too small to characterize, not definitely changed since 01/03/2016 unenhanced CT images. No additional liver lesions. Normal gallbladder with no radiopaque cholelithiasis. No biliary ductal dilatation. Pancreas: Normal, with no mass or duct dilation. Spleen: Normal size. No mass. Adrenals/Urinary Tract: No discrete adrenal nodules. Two hypodense subcentimeter renal cortical lesions in the right kidney are too small to characterize. Otherwise normal kidneys, with no hydronephrosis. Normal bladder. Stomach/Bowel: Tiny hiatal hernia. Otherwise grossly normal stomach. Normal caliber small bowel with no small bowel wall thickening. Normal appendix. Normal large bowel with no diverticulosis, large bowel wall thickening or pericolonic fat stranding. Vascular/Lymphatic: Atherosclerotic nonaneurysmal abdominal aorta. Patent portal, splenic and renal veins . No pathologically enlarged lymph nodes in the abdomen or  pelvis. Reproductive: Stable mildly enlarged myomatous anteverted uterus with scattered coarse calcifications in the uterus. No adnexal mass. Other: No pneumoperitoneum, ascites or focal fluid collection. Musculoskeletal: No aggressive appearing focal osseous lesions. Moderate lumbar spondylosis. IMPRESSION: 1. Bulky right axillary and mild right retropectoral and right supraclavicular  lymphadenopathy is mildly decreased since 01/03/2016 PET-CT. 2. Left axillary lymphadenopathy is mildly increased. 3. Diffuse asymmetric thickening of the skin and fibroglandular soft tissues of the right breast is not appreciably changed. 4. No definite additional sites of metastatic disease in the chest, abdomen or pelvis. 5. Subcentimeter right liver lobe lesion is too small to characterize and is probably stable. 6. Additional findings include aortic atherosclerosis, 1 vessel coronary atherosclerosis, tiny hiatal hernia and myomatous uterus. Electronically Signed   By: Ilona Sorrel M.D.   On: 04/25/2016 08:53   DIAGNOSTIC MAMMOGRAM OF RIGHT BREAST 12/27/2015 FINDINGS: CC and MLO views of the right breast were performed with tomosynthesis. The right breast demonstrates diffuse skin thickening and increased density. There is right axillary adenopathy. No focal mass is identified within the breast.  Mammographic images were processed with CAD.  On physical exam, there is palpable right axillary adenopathy including a 5 cm right axillary lymph node. The right breast and nipple are asymmetrically enlarged relative to the left.  Targeted ultrasound is performed, showing numerous enlarged right axillary lymph nodes, the largest measuring 5.3 x 4.2 x 2.6 cm.  IMPRESSION: Suspicious right axillary mass.  CT CHEST, ABDOMEN AND PELVIS WITH CONTRAST 04/24/2016 IMPRESSION: 1. Bulky right axillary and mild right retropectoral and right supraclavicular lymphadenopathy is mildly decreased since 01/03/2016 PET-CT. 2. Left axillary lymphadenopathy is mildly increased. 3. Diffuse asymmetric thickening of the skin and fibroglandular soft tissues of the right breast is not appreciably changed. 4. No definite additional sites of metastatic disease in the chest, abdomen or pelvis. 5. Subcentimeter right liver lobe lesion is too small to characterize and is probably stable. 6. Additional findings  include aortic atherosclerosis, 1 vessel coronary atherosclerosis, tiny hiatal hernia and myomatous uterus.    ASSESSMENT & PLAN:  70 year old female, with past medical history of HTN, GERD, sleep apnea, presented with bulky right axillary adenopathy.  1. Primary cancer of right breast with metastasis to distant lymph nodes, invasive ductal carcinoma and DCIS, ER-/PR-/HER2-,  [id number], stage IV  -I previously reviewed her mammogram, ultrasound and a CT chest, and biopsy pathology findings with patient and her husband in details -I discussed her breast MRI, PET scan, and breast biopsy results in great details with patient and her family members  -Her breast MRI findings and breast biopsy confirmed primary breast cancer, triple negative. Unfortunately her cancer has metastasized to left axilla and cervical lymph nodes, which are distant metastasis.  -We previously reviewed the natural history of metastatic triple negative breast cancer, which is very aggressive, and her cancer is incurable at this stage  -Giving the metastatic disease, surgery up front is not indicated, I recommend systemic chemotherapy. -She has started weekly Taxol, tolerating well, no neuropathy or other side effects so far, we will continue -Her right axillary lymph node has gotten smaller since she started chemotherapy, corresponding to good clinical response -I reviewed her restaging CT scan findings from 04/24/16, which showed slight improvement in the right-sided adenopathy, no other new metastasis. -She is clinically doing well, lab reviewed, we'll continue chemotherapy with weekly Taxol   2. ANEMIA in neoplastic disease -She developed normocytic mild anemia in the past few months,  -Her anemia workup shows a  normal folic acid and M35 level, increased ferritin, decreased serum iron, TIBC and transferrin saturation. This is most consistent with anemia of chronic disease, secondary to her CKD and underlying  malignancy. -I encourage her to continue oral iron supplement   3. CKD stage III -Her GFR is in 40-50's  -No lab signs of tumor lysis -Possibly related to her underlying hypertension and diuretics  -Her Cr has much improved since she stopped her Lasix. -Avoid nephrotoxins, such as NSAIDs and IV CT contrast  4. HTN, GERD, obesity, sleep apnea -She'll continue follow-up with her primary care physician   PLAN -lab reviewed, continue weekly Taxol -I will see her back in 2 weeks.   I spent 20 minutes counseling the patient face to face. The total time spent in the appointment was 25 minute s and more than 50% was on counseling.     Truitt Merle, MD 05/14/2016

## 2016-05-14 NOTE — Patient Instructions (Signed)
[facility] Discharge Instructions for Patients Receiving Chemotherapy  Today you received the following chemotherapy agents Taxol  To help prevent nausea and vomiting after your treatment, we encourage you to take your nausea medication   If you develop nausea and vomiting that is not controlled by your nausea medication, call the clinic.   BELOW ARE SYMPTOMS THAT SHOULD BE REPORTED IMMEDIATELY:  *FEVER GREATER THAN 100.5 F  *CHILLS WITH OR WITHOUT FEVER  NAUSEA AND VOMITING THAT IS NOT CONTROLLED WITH YOUR NAUSEA MEDICATION  *UNUSUAL SHORTNESS OF BREATH  *UNUSUAL BRUISING OR BLEEDING  TENDERNESS IN MOUTH AND THROAT WITH OR WITHOUT PRESENCE OF ULCERS  *URINARY PROBLEMS  *BOWEL PROBLEMS  UNUSUAL RASH Items with * indicate a potential emergency and should be followed up as soon as possible.  Feel free to call the clinic you have any questions or concerns. The clinic phone number is [phone].  Please show the CHEMO ALERT CARD at check-in to the Emergency Department and triage nurse.   

## 2016-05-14 NOTE — Telephone Encounter (Signed)
Message sent to chemo scheduler to be added per 05/14/16 los. Appointments scheduled per 05/14/16 los. A copy of the AVS report and appointment schedule was given to patient, per 05/14/16 los.

## 2016-05-21 ENCOUNTER — Ambulatory Visit (HOSPITAL_BASED_OUTPATIENT_CLINIC_OR_DEPARTMENT_OTHER): Payer: Commercial Managed Care - HMO

## 2016-05-21 ENCOUNTER — Other Ambulatory Visit (HOSPITAL_BASED_OUTPATIENT_CLINIC_OR_DEPARTMENT_OTHER): Payer: Commercial Managed Care - HMO

## 2016-05-21 VITALS — BP 154/78 | HR 74 | Temp 98.0°F | Resp 16

## 2016-05-21 DIAGNOSIS — C50911 Malignant neoplasm of unspecified site of right female breast: Secondary | ICD-10-CM | POA: Diagnosis not present

## 2016-05-21 DIAGNOSIS — C773 Secondary and unspecified malignant neoplasm of axilla and upper limb lymph nodes: Secondary | ICD-10-CM

## 2016-05-21 DIAGNOSIS — Z5111 Encounter for antineoplastic chemotherapy: Secondary | ICD-10-CM

## 2016-05-21 DIAGNOSIS — C799 Secondary malignant neoplasm of unspecified site: Secondary | ICD-10-CM | POA: Diagnosis not present

## 2016-05-21 LAB — CBC WITH DIFFERENTIAL/PLATELET
BASO%: 1.1 % (ref 0.0–2.0)
BASOS ABS: 0 10*3/uL (ref 0.0–0.1)
EOS ABS: 0.1 10*3/uL (ref 0.0–0.5)
EOS%: 2.3 % (ref 0.0–7.0)
HCT: 33.5 % — ABNORMAL LOW (ref 34.8–46.6)
HGB: 10.8 g/dL — ABNORMAL LOW (ref 11.6–15.9)
LYMPH%: 16.9 % (ref 14.0–49.7)
MCH: 28.9 pg (ref 25.1–34.0)
MCHC: 32.2 g/dL (ref 31.5–36.0)
MCV: 89.6 fL (ref 79.5–101.0)
MONO#: 0.3 10*3/uL (ref 0.1–0.9)
MONO%: 9.2 % (ref 0.0–14.0)
NEUT#: 2.5 10*3/uL (ref 1.5–6.5)
NEUT%: 70.5 % (ref 38.4–76.8)
Platelets: 260 10*3/uL (ref 145–400)
RBC: 3.74 10*6/uL (ref 3.70–5.45)
RDW: 16.3 % — ABNORMAL HIGH (ref 11.2–14.5)
WBC: 3.5 10*3/uL — AB (ref 3.9–10.3)
lymph#: 0.6 10*3/uL — ABNORMAL LOW (ref 0.9–3.3)

## 2016-05-21 LAB — COMPREHENSIVE METABOLIC PANEL
ALT: 12 U/L (ref 0–55)
ANION GAP: 10 meq/L (ref 3–11)
AST: 13 U/L (ref 5–34)
Albumin: 3.5 g/dL (ref 3.5–5.0)
Alkaline Phosphatase: 52 U/L (ref 40–150)
BILIRUBIN TOTAL: 0.43 mg/dL (ref 0.20–1.20)
BUN: 27 mg/dL — ABNORMAL HIGH (ref 7.0–26.0)
CALCIUM: 9.2 mg/dL (ref 8.4–10.4)
CO2: 24 meq/L (ref 22–29)
Chloride: 108 mEq/L (ref 98–109)
Creatinine: 1.2 mg/dL — ABNORMAL HIGH (ref 0.6–1.1)
EGFR: 56 mL/min/{1.73_m2} — AB (ref >90)
Glucose: 87 mg/dl (ref 70–140)
Potassium: 4.3 mEq/L (ref 3.5–5.1)
Sodium: 142 mEq/L (ref 136–145)
TOTAL PROTEIN: 7.3 g/dL (ref 6.4–8.3)

## 2016-05-21 MED ORDER — DIPHENHYDRAMINE HCL 50 MG/ML IJ SOLN
25.0000 mg | Freq: Once | INTRAMUSCULAR | Status: AC
  Administered 2016-05-21: 25 mg via INTRAVENOUS

## 2016-05-21 MED ORDER — DIPHENHYDRAMINE HCL 50 MG/ML IJ SOLN
INTRAMUSCULAR | Status: AC
  Filled 2016-05-21: qty 1

## 2016-05-21 MED ORDER — SODIUM CHLORIDE 0.9 % IV SOLN
80.0000 mg/m2 | Freq: Once | INTRAVENOUS | Status: AC
  Administered 2016-05-21: 156 mg via INTRAVENOUS
  Filled 2016-05-21: qty 26

## 2016-05-21 MED ORDER — DEXAMETHASONE SODIUM PHOSPHATE 10 MG/ML IJ SOLN
INTRAMUSCULAR | Status: AC
  Filled 2016-05-21: qty 2

## 2016-05-21 MED ORDER — FAMOTIDINE IN NACL 20-0.9 MG/50ML-% IV SOLN
INTRAVENOUS | Status: AC
  Filled 2016-05-21: qty 50

## 2016-05-21 MED ORDER — DEXAMETHASONE SODIUM PHOSPHATE 10 MG/ML IJ SOLN
15.0000 mg | Freq: Once | INTRAMUSCULAR | Status: AC
  Administered 2016-05-21: 10 mg via INTRAVENOUS

## 2016-05-21 MED ORDER — FAMOTIDINE IN NACL 20-0.9 MG/50ML-% IV SOLN
20.0000 mg | Freq: Once | INTRAVENOUS | Status: AC
  Administered 2016-05-21: 20 mg via INTRAVENOUS

## 2016-05-21 MED ORDER — SODIUM CHLORIDE 0.9 % IV SOLN
Freq: Once | INTRAVENOUS | Status: AC
  Administered 2016-05-21: 15:00:00 via INTRAVENOUS

## 2016-05-21 NOTE — Progress Notes (Signed)
Red itchy rash noted to right hand, left and right heel. Patient states that the rash started on Saturday. Dr. [name] to beside to assess rash, okay to be treated today per Dr. [name]. Patient instructed to use cortisone cream and antibiotic cream to rash by Dr. [name] and verbalized understanding.

## 2016-05-21 NOTE — Patient Instructions (Signed)
[facility] Discharge Instructions for Patients Receiving Chemotherapy  Today you received the following chemotherapy agents Taxol  To help prevent nausea and vomiting after your treatment, we encourage you to take your nausea medication   If you develop nausea and vomiting that is not controlled by your nausea medication, call the clinic.   BELOW ARE SYMPTOMS THAT SHOULD BE REPORTED IMMEDIATELY:  *FEVER GREATER THAN 100.5 F  *CHILLS WITH OR WITHOUT FEVER  NAUSEA AND VOMITING THAT IS NOT CONTROLLED WITH YOUR NAUSEA MEDICATION  *UNUSUAL SHORTNESS OF BREATH  *UNUSUAL BRUISING OR BLEEDING  TENDERNESS IN MOUTH AND THROAT WITH OR WITHOUT PRESENCE OF ULCERS  *URINARY PROBLEMS  *BOWEL PROBLEMS  UNUSUAL RASH Items with * indicate a potential emergency and should be followed up as soon as possible.  Feel free to call the clinic you have any questions or concerns. The clinic phone number is [phone].  Please show the CHEMO ALERT CARD at check-in to the Emergency Department and triage nurse.   

## 2016-05-21 NOTE — Addendum Note (Signed)
Addended by: Truitt Merle on: 05/21/2016 02:38 PM   Modules accepted: Orders

## 2016-05-22 LAB — CANCER ANTIGEN 27.29: CA 27.29: 99.2 U/mL — ABNORMAL HIGH (ref 0.0–38.6)

## 2016-05-27 ENCOUNTER — Other Ambulatory Visit: Payer: Self-pay | Admitting: Internal Medicine

## 2016-05-27 NOTE — Progress Notes (Signed)
[place]  [phone] [phone]  Clinic Follow Up Note   Patient Care Team: [name], MD as PCP - General (Internal Medicine) 05/28/2016   CHIEF COMPLAINTS:  Follow up right breast cancer   Oncology History   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 [facility])   Staging form: Breast, AJCC 7th Edition   - Clinical stage from 11/30/2015: Stage IV (T4d, N3c, M1) - Signed by Truitt Merle, MD on 01/05/2016      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 [facility])   11/30/2015 Initial Diagnosis    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 [facility])      11/30/2015 Initial Biopsy    Right axilla lymph node biopsy showed metastatic poorly differentiated adenocarcinoma, IHC positive for CK AE1/AE3, CK7, E-cadherin, CDX-2, and focally very weak to equivocal staining for ER      12/21/2015 Imaging    Bilateral breast MRI showed extensive non-mass enhancement involving the entire right breast, right axillary, supraclavicular, and internal mammary adenopathy, new left axillary adenopathy      12/27/2015 Mammogram    Diagnostic mammogram and ultrasound of right breast and axilla showed diffuse skin thickening and increased density, no focal mass, numerous enlarged right axillary lymph node, the largest measuring 5.3cm.       12/27/2015 Initial Biopsy    Right breast core needle biopsy showed invasive ductal carcinoma, and DCIS, lymphovascular invasion identified      12/27/2015 Receptors her2    ER negative, PR negative, Ki-67 70%, HER-2 negative      01/03/2016 Imaging    PET scan showed diffuse asymmetric right breast hypermetabolic soft tissue density and skin thickening, hypermetabolic metastatic lymphadenopathy in bilateral axillary and subpectoral regions, right supraclavicular region and the right cervical level 5      01/10/2016 -  Chemotherapy    Weekly Taxol 80 mg/m, second cycle was postponed  to 8/22 due to pt's non-compliance       02/07/2016 Pathology Results    Left axillary lymph node biopsy showed metastatic poorly differentiated carcinoma, triple negative.      04/24/2016 Imaging    CT CHEST, ABDOMEN AND PELVIS WITH CONTRAST IMPRESSION: 1. Bulky right axillary and mild right retropectoral and right supraclavicular lymphadenopathy is mildly decreased since 01/03/2016 PET-CT. 2. Left axillary lymphadenopathy is mildly increased. 3. Diffuse asymmetric thickening of the skin and fibroglandular soft tissues of the right breast is not appreciably changed. 4. No definite additional sites of metastatic disease in the chest, abdomen or pelvis. 5. Subcentimeter right liver lobe lesion is too small to characterize and is probably stable. 6. Additional findings include aortic atherosclerosis, 1 vessel coronary atherosclerosis, tiny hiatal hernia and myomatous uterus.       HISTORY OF PRESENTING ILLNESS (12/14/2015) :  [name] 70 y.o. 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 She is accompanied by her husband to the clinic today.  She noticed a lump at right axilla in 08/2015, no pain or other complains, she also report right breast swelling, no pain, skin erythema, nipple discharge or other complain. She otherwise feels well. She was initially seen at urgent care in March, then was referred to establish her care with prior care physician at [facility] internal medicine center. Her screening mammogram was negative in January 2017. She subsequently underwent CT chest on 09/27/2015 which showed enlarged and inflamed right breast, bulky right axillary  lymph nodes. She was referred to breast Center for diagnostic mammogram and ultrasound of right breast, which was negative. She underwent ultrasound guided right axillary node biopsy on 12/05/2015, which reviewed port differentiated carcinoma. ER weakly focally positive.  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 She works independently. She lives with her husband. She denies any fever, night sweats, chills, or skin itchiness.  CURRENT THERAPY: chemotherapy with weekly Taxol, started on 01/10/2016, second cycle was delayed due to pt's non-compliance   INTERIM HISTORY:  LAtticusreturns for follow up and chemo. The patient states she cannot walk since the last weekend. Reports it occurred after scrapping her feet bilaterally.. Denies her feett being cut. Her feet are not sensitive to touch currently, but reports tenderness previously. Denies neuropathy in her hands. Reports a itchy rash on her right hand, but nowhere else. Using triple antibiotic and Biofreeze on hands and feet. Reports neuropathy in her toes. Report her feet have been tingling since the weekend. Reports her right breast feels hard and heavy.  MEDICAL HISTORY:  Past Medical History:  Diagnosis Date  . Arthritis   . GERD (gastroesophageal reflux disease)   . Hypertension   . Shortness of breath   . Sleep apnea   . Stroke ([facility]     SURGICAL HISTORY: Past Surgical History:  Procedure Laterality Date  . NO PAST SURGERIES      SOCIAL HISTORY: Social History   Social History  . Marital status: Married    Spouse name: N/A  . Number of children: 4  . Years of education: 8 TH   Occupational History  . Not on file.   Social History Main Topics  . Smoking status: Former Smoker    Packs/day: 0.50    Years: 1.00    Types: Cigarettes    Start date: 06/17/1968    Quit date: 06/17/1973  . Smokeless tobacco: Never Used     Comment: Quit in 159s . Alcohol use No     Comment: former drinker for approximately 10  years  . Drug use: No  . Sexual activity: Not on file   Other Topics Concern  . Not on file   Social History Narrative   Patient is married with 3 children still living and 1 deceased son.   Patient is right handed.   Patient has 8 th grade education.   Patient drinks 2-3 servings daily.    FAMILY HISTORY: Family History  Problem Relation Age of Onset  . Heart disease Mother   . Cancer Father 745   throat cancer  . Cancer Cousin     breast cancer    ALLERGIES:  has No Known Allergies.  MEDICATIONS:  Current Outpatient Prescriptions  Medication Sig Dispense Refill  . amLODipine (NORVASC) 10 MG tablet Take 1 tablet (10 mg total) by mouth daily. 90 tablet 3  . aspirin EC 81 MG tablet Take 1 tablet (81 mg total) by mouth daily. 90 tablet 3  . atorvastatin (LIPITOR) 20 MG tablet Take 1 tablet (20 mg total) by mouth daily. 90 tablet 3  . furosemide (LASIX) 20 MG tablet 20  mg daily.     . metoprolol tartrate (LOPRESSOR) 25 MG tablet Take 1 tablet (25 mg total) by mouth daily. 90 tablet 0  . ondansetron (ZOFRAN) 8 MG tablet Take 1 tablet (8 mg total) by mouth 2 (two) times daily as needed (Nausea or vomiting). 30 tablet 1  . prochlorperazine (COMPAZINE) 10 MG tablet Take 1 tablet (10 mg total) by mouth every 6 (six) hours as needed (Nausea or vomiting). 30 tablet 1  . traMADol (ULTRAM) 50 MG tablet Take 1 tablet (50 mg total) by mouth every 8 (eight) hours as needed. 30 tablet 0   No current facility-administered medications for this visit.    Facility-Administered Medications Ordered in Other Visits  Medication Dose Route Frequency Provider Last Rate Last Dose  . PACLitaxel (TAXOL) 114 mg in sodium chloride 0.9 % 250 mL chemo infusion (</= 58m/m2)  60 mg/m2 (Treatment Plan Recorded) Intravenous Once YTruitt Merle MD           REVIEW OF SYSTEMS: Constitutional: Denies fevers, chills or abnormal night sweats Eyes: Denies blurriness of vision, double vision or watery eyes Ears,  nose, mouth, throat, and face: Denies mucositis or sore throat Respiratory: Denies cough, dyspnea or wheezes Cardiovascular: Denies palpitation, chest discomfort or lower extremity swelling Gastrointestinal:  Denies nausea, heartburn or change in bowel habits Skin: (+) Rash on her right hand. (+) Dry skin on bilateral feet. Lymphatics: Denies new lymphadenopathy or easy bruising Neurological: (+) Tingling toes. Denies numbness. Behavioral/Psych: Mood is stable, no new changes  All other systems were reviewed with the patient and are negative.  PHYSICAL EXAMINATION: ECOG PERFORMANCE STATUS: 1  Vitals:   05/28/16 1132  BP: (!) 143/83  Pulse: 83  Resp: 18  Temp: 98.5 F (36.9 C)   Filed Weights   05/28/16 1132  Weight: 186 lb 6.4 oz (84.6 kg)    GENERAL:alert, no distress and comfortable SKIN: skin color, texture, turgor are normal, no rashes or significant lesions EYES: normal, conjunctiva are pink and non-injected, sclera clear OROPHARYNX:no exudate, no erythema and lips, buccal mucosa, and tongue normal  NECK: supple, thyroid normal size, non-tender, without nodularity LYMPH:  no palpable lymphadenopathy in the cervical, axillary or inguinal LUNGS: clear to auscultation and percussion with normal breathing effort HEART: regular rate & rhythm and no murmurs and no lower extremity edema ABDOMEN:abdomen soft, non-tender and normal bowel sounds Musculoskeletal:no cyanosis of digits and no clubbing  PSYCH: alert & oriented x 3 with fluent speech NEURO: Decreased vibration sensation on bilateral ankle and wrist. SKIN: Dry skin of the bilateral feet. Rash on the right palm, skin peeling, no discharge. EXTREMITIES: Bilateral hand and lower extremity edema. Breasts: Palpable mass measuring 10 x 10 cm in the center of the right breast. With associated skin erythema and edema. There is a 3.0x3.5cm (initially 6X6.5cm) mass at the anterior right axilla, close to right chest wall of breast  margin, no tenderness, left axilla was negative.   LABORATORY DATA:  I have reviewed the data as listed CBC Latest Ref Rng & Units 05/28/2016 05/21/2016 05/14/2016  WBC 3.9 - 10.3 10e3/uL 3.5(L) 3.5(L) 3.6(L)  Hemoglobin 11.6 - 15.9 g/dL 11.3(L) 10.8(L) 11.8  Hematocrit 34.8 - 46.6 % 35.2 33.5(L) 36.4  Platelets 145 - 400 10e3/uL 290 260 275   CMP Latest Ref Rng & Units 05/28/2016 05/21/2016 05/14/2016  Glucose 70 - 140 mg/dl 100 87 90  BUN 7.0 - 26.0 mg/dL 25.3 27.0(H) 23.8  Creatinine 0.6 - 1.1 mg/dL 1.3(H) 1.2(H) 1.1  Sodium 136 - 145 mEq/L 142 142 142  Potassium 3.5 - 5.1 mEq/L 4.1 4.3 4.2  Chloride 96 - 106 mmol/L - - -  CO2 22 - 29 mEq/L _0 Calcium 8.4 - 10.4 mg/dL 9.5 9.2 9.6  Total Protein 6.4 - 8.3 g/dL 7.6 7.3 7.4  Total Bilirubin 0.20 - 1.20 mg/dL 0.51 0.43 0.51  Alkaline Phos 40 - 150 U/L 54 52 51  AST 5 - 34 U/L _1 ALT 0 - 55 U/L _2 CA27.29: 01/05/2016: 155.9 02/27/2016: 96.7 03/26/2016: 64.7 04/23/2016: 65.4 05/21/2016: 99.2   PATHOLOGY REPORT: Diagnosis 11/30/2015 Lymph node, needle/core biopsy, right breast/axilla - LYMPH NODE POSITIVE FOR METASTATIC POORLY DIFFERENTIATED CARCINOMA. - SEE COMMENT. Microscopic Comment Immunohistochemical stains are performed. The tumor is positive for cytokeratin AE1/AE3, cytokeratin 7 and E-cadherin. CDX-2 demonstrates weak positivity. Estrogen receptor demonstrates focal very weak to equivocal staining. Gross cystic disease fluid protein, cytokeratin 20, TTF-1, Melan-A and S100 are all negative. The findings are consistent with a poorly differentiated carcinoma. Given the staining pattern, an upper gastrointestinal and pancreatobiliary primary source should be ruled out. In addition, given the axillary location of the biopsy and focal weak to equivocal estrogen receptor staining, ruling out a poorly differentiated carcinoma of breast primary source is also prudent. Lastly, a gynecologic primary may be  considered. Correlation with clinical and radiologic impression is essential. Dr. Vicente Males has seen this case in consultation with essential agreement of the above diagnosis and comment. The findings are called to the [place] on 12/04/15. (RAH:gt, 12/04/15)  Diagnosis 12/27/2015 Breast, right, needle core biopsy, central - INVASIVE MAMMARY CARCINOMA. - MAMMARY CARCINOMA IN SITU. - LYMPH/VASCULAR INVASION IS IDENTIFIED. - SEE COMMENT. Microscopic Comment An E-cadherin stain will be performed to determine if the carcinoma is ductal or lobular in nature. The results of the stain will be reported in an addendum to follow. Although definitive grading of breast carcinoma is best done on excision, the features of the invasive tumor from the right central breast biopsy are compatible with a grade 3 breast carcinoma. Breast prognostic markers will be performed and reported in an addendum. Findings are called to the [place] on 12/28/2015. Dr. Lyndon Code has seen this case in consultation with agreement. (RH:kh 12-28-15) ADDENDUM: An E-cadherin immunohistochemical stain is performed which is positive in both the invasive and in situ components confirming the ductal nature of both (i.e. invasive ductal carcinoma with ductal carcinoma in situ). (RAH:gt, 12/29/15) PROGNOSTIC INDICATORS Results: IMMUNOHISTOCHEMICAL AND MORPHOMETRIC ANALYSIS PERFORMED MANUALLY Estrogen Receptor: 0%, NEGATIVE Progesterone Receptor: 0%, NEGATIVE Proliferation Marker Ki67: 70% Results: HER2 - NEGATIVE RATIO OF HER2/CEP17 SIGNALS 1.13 AVERAGE HER2 COPY NUMBER PER CELL 1.75  Diagnosis 02/07/2016 Lymph node, needle/core biopsy, left axillary - METASTATIC POORLY DIFFERENTIATED CARCINOMA. Results: IMMUNOHISTOCHEMICAL AND MORPHOMETRIC ANALYSIS PERFORMED MANUALLY Estrogen Receptor: 0%, NEGATIVE Progesterone Receptor: 0%, NEGATIVE Results: HER2 - NEGATIVE RATIO OF HER2/CEP17 SIGNALS  1.21 AVERAGE HER2 COPY NUMBER PER CELL 2.05  RADIOGRAPHIC STUDIES: I have personally reviewed the radiological images as listed and agreed with the findings in the report. No results found. DIAGNOSTIC MAMMOGRAM OF RIGHT BREAST 12/27/2015 FINDINGS: CC and MLO views of the right breast were performed with tomosynthesis. The right breast demonstrates diffuse skin thickening and increased density. There is right axillary adenopathy. No focal mass is identified within the breast.  Mammographic images were processed with CAD.  On physical exam, there is palpable right axillary adenopathy including  a 5 cm right axillary lymph node. The right breast and nipple are asymmetrically enlarged relative to the left.  Targeted ultrasound is performed, showing numerous enlarged right axillary lymph nodes, the largest measuring 5.3 x 4.2 x 2.6 cm.  IMPRESSION: Suspicious right axillary mass.  CT CHEST, ABDOMEN AND PELVIS WITH CONTRAST 04/24/2016 IMPRESSION: 1. Bulky right axillary and mild right retropectoral and right supraclavicular lymphadenopathy is mildly decreased since 01/03/2016 PET-CT. 2. Left axillary lymphadenopathy is mildly increased. 3. Diffuse asymmetric thickening of the skin and fibroglandular soft tissues of the right breast is not appreciably changed. 4. No definite additional sites of metastatic disease in the chest, abdomen or pelvis. 5. Subcentimeter right liver lobe lesion is too small to characterize and is probably stable. 6. Additional findings include aortic atherosclerosis, 1 vessel coronary atherosclerosis, tiny hiatal hernia and myomatous uterus.    ASSESSMENT & PLAN:  70 y.o. female, with past medical history of HTN, GERD, sleep apnea, presented with bulky right axillary adenopathy.  1. 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 Unfortunately her cancer has metastasized to left axilla and cervical lymph nodes, which are distant metastasis.  -We previously reviewed the natural history of metastatic triple negative breast cancer, which is very aggressive, and her cancer is incurable at this stage  -Giving the metastatic disease, surgery up front is not indicated, I recommended systemic chemotherapy. -She started weekly Taxol. Now presenting with neuropathy. -I previously reviewed her restaging CT scan findings from 04/24/16, which showed slight improvement in the right-sided adenopathy, no other new metastasis. -Physical exam today has reviewed significantly increase of the mass in the right breast, with skin erythema and warmness, concerning for inflammatory breast cancer. I recommend a restaging PET scan, for further evaluation. If the scan confirms disease progression, I'll switch her chemotherapy to Adriamycin and Cytoxan, for disease control, to prevent bleeding and infection from the primary breast cancer. -lab reviewed, we'll continue chemotherapy with weekly Taxol today. Due to neuropathy, I'll reduce her Taxol dose to 60 mg/m. She will not have treatment next week per patient's request.   2. ANEMIA in neoplastic disease -She developed normocytic mild anemia in the past few months. -Her anemia workup shows a normal folic acid and J50 level, increased ferritin, decreased serum iron, TIBC and transferrin saturation. This is most consistent with anemia of chronic disease, secondary to her CKD and underlying malignancy.  -I previously encouraged her to continue oral iron supplement  3. CKD stage  III -Her GFR is in 40-50's  -No lab signs of tumor lysis -Possibly related to her underlying hypertension and diuretics  -Her Cr has much improved since she stopped her Lasix. -Avoid nephrotoxins, such as NSAIDs and IV CT contrast  4. HTN, GERD, obesity, sleep apnea -She'll continue follow-up with her primary care physician  PLAN -lab reviewed.  -Decrease Taxol dose today due to neuropathy and her skin rash. She will have chemo break next week  -Growing breast mass concerning for disease progression. I will order a restaging PET scan and echocardiogram next week. See her in 2 weeks to further  discuss treatment, likely switch to [facility]    I spent 20 minutes counseling the patient face to face. The total time spent in the appointment was 25 minute s and more than 50% was on counseling.     Truitt Merle, MD 05/28/2016   This document serves as a record of services personally performed by Truitt Merle, MD. It was created on her behalf by Darcus Austin, a trained medical scribe. The creation of this record is based on the scribe's personal observations and the provider's statements to them. This document has been checked and approved by the attending provider.

## 2016-05-28 ENCOUNTER — Encounter: Payer: Self-pay | Admitting: Hematology

## 2016-05-28 ENCOUNTER — Ambulatory Visit (HOSPITAL_BASED_OUTPATIENT_CLINIC_OR_DEPARTMENT_OTHER): Payer: Commercial Managed Care - HMO | Admitting: Hematology

## 2016-05-28 ENCOUNTER — Other Ambulatory Visit (HOSPITAL_BASED_OUTPATIENT_CLINIC_OR_DEPARTMENT_OTHER): Payer: Commercial Managed Care - HMO

## 2016-05-28 ENCOUNTER — Ambulatory Visit (HOSPITAL_BASED_OUTPATIENT_CLINIC_OR_DEPARTMENT_OTHER): Payer: Commercial Managed Care - HMO

## 2016-05-28 VITALS — BP 143/83 | HR 83 | Temp 98.5°F | Resp 18 | Ht 64.0 in | Wt 186.4 lb

## 2016-05-28 DIAGNOSIS — Z5111 Encounter for antineoplastic chemotherapy: Secondary | ICD-10-CM

## 2016-05-28 DIAGNOSIS — C50811 Malignant neoplasm of overlapping sites of right female breast: Secondary | ICD-10-CM | POA: Diagnosis not present

## 2016-05-28 DIAGNOSIS — C773 Secondary and unspecified malignant neoplasm of axilla and upper limb lymph nodes: Secondary | ICD-10-CM

## 2016-05-28 DIAGNOSIS — C50911 Malignant neoplasm of unspecified site of right female breast: Secondary | ICD-10-CM

## 2016-05-28 DIAGNOSIS — D630 Anemia in neoplastic disease: Secondary | ICD-10-CM | POA: Diagnosis not present

## 2016-05-28 DIAGNOSIS — I10 Essential (primary) hypertension: Secondary | ICD-10-CM

## 2016-05-28 DIAGNOSIS — D631 Anemia in chronic kidney disease: Secondary | ICD-10-CM

## 2016-05-28 DIAGNOSIS — E669 Obesity, unspecified: Secondary | ICD-10-CM

## 2016-05-28 DIAGNOSIS — G4733 Obstructive sleep apnea (adult) (pediatric): Secondary | ICD-10-CM

## 2016-05-28 DIAGNOSIS — N183 Chronic kidney disease, stage 3 unspecified: Secondary | ICD-10-CM

## 2016-05-28 DIAGNOSIS — Z171 Estrogen receptor negative status [ER-]: Secondary | ICD-10-CM | POA: Diagnosis not present

## 2016-05-28 LAB — CBC WITH DIFFERENTIAL/PLATELET
BASO%: 1.2 % (ref 0.0–2.0)
BASOS ABS: 0 10*3/uL (ref 0.0–0.1)
EOS ABS: 0.1 10*3/uL (ref 0.0–0.5)
EOS%: 2.3 % (ref 0.0–7.0)
HCT: 35.2 % (ref 34.8–46.6)
HEMOGLOBIN: 11.3 g/dL — AB (ref 11.6–15.9)
LYMPH%: 15.4 % (ref 14.0–49.7)
MCH: 28.5 pg (ref 25.1–34.0)
MCHC: 32.1 g/dL (ref 31.5–36.0)
MCV: 88.9 fL (ref 79.5–101.0)
MONO#: 0.4 10*3/uL (ref 0.1–0.9)
MONO%: 11.3 % (ref 0.0–14.0)
NEUT#: 2.4 10*3/uL (ref 1.5–6.5)
NEUT%: 69.8 % (ref 38.4–76.8)
Platelets: 290 10*3/uL (ref 145–400)
RBC: 3.96 10*6/uL (ref 3.70–5.45)
RDW: 15.4 % — ABNORMAL HIGH (ref 11.2–14.5)
WBC: 3.5 10*3/uL — ABNORMAL LOW (ref 3.9–10.3)
lymph#: 0.5 10*3/uL — ABNORMAL LOW (ref 0.9–3.3)

## 2016-05-28 LAB — COMPREHENSIVE METABOLIC PANEL
ALBUMIN: 3.5 g/dL (ref 3.5–5.0)
ALK PHOS: 54 U/L (ref 40–150)
ALT: 11 U/L (ref 0–55)
ANION GAP: 9 meq/L (ref 3–11)
AST: 13 U/L (ref 5–34)
BILIRUBIN TOTAL: 0.51 mg/dL (ref 0.20–1.20)
BUN: 25.3 mg/dL (ref 7.0–26.0)
CO2: 27 mEq/L (ref 22–29)
Calcium: 9.5 mg/dL (ref 8.4–10.4)
Chloride: 106 mEq/L (ref 98–109)
Creatinine: 1.3 mg/dL — ABNORMAL HIGH (ref 0.6–1.1)
EGFR: 47 mL/min/{1.73_m2} — ABNORMAL LOW (ref >90)
Glucose: 100 mg/dl (ref 70–140)
Potassium: 4.1 mEq/L (ref 3.5–5.1)
Sodium: 142 mEq/L (ref 136–145)
TOTAL PROTEIN: 7.6 g/dL (ref 6.4–8.3)

## 2016-05-28 MED ORDER — FAMOTIDINE IN NACL 20-0.9 MG/50ML-% IV SOLN
INTRAVENOUS | Status: AC
  Filled 2016-05-28: qty 50

## 2016-05-28 MED ORDER — FAMOTIDINE IN NACL 20-0.9 MG/50ML-% IV SOLN
20.0000 mg | Freq: Once | INTRAVENOUS | Status: AC
  Administered 2016-05-28: 20 mg via INTRAVENOUS

## 2016-05-28 MED ORDER — DEXAMETHASONE SODIUM PHOSPHATE 10 MG/ML IJ SOLN
15.0000 mg | Freq: Once | INTRAMUSCULAR | Status: AC
  Administered 2016-05-28: 15 mg via INTRAVENOUS

## 2016-05-28 MED ORDER — DEXAMETHASONE SODIUM PHOSPHATE 10 MG/ML IJ SOLN
INTRAMUSCULAR | Status: AC
  Filled 2016-05-28: qty 2

## 2016-05-28 MED ORDER — SODIUM CHLORIDE 0.9 % IV SOLN
Freq: Once | INTRAVENOUS | Status: AC
  Administered 2016-05-28: 13:00:00 via INTRAVENOUS

## 2016-05-28 MED ORDER — DIPHENHYDRAMINE HCL 50 MG/ML IJ SOLN
25.0000 mg | Freq: Once | INTRAMUSCULAR | Status: AC
  Administered 2016-05-28: 25 mg via INTRAVENOUS

## 2016-05-28 MED ORDER — SODIUM CHLORIDE 0.9 % IV SOLN
60.0000 mg/m2 | Freq: Once | INTRAVENOUS | Status: AC
  Administered 2016-05-28: 114 mg via INTRAVENOUS
  Filled 2016-05-28: qty 19

## 2016-05-28 MED ORDER — DIPHENHYDRAMINE HCL 50 MG/ML IJ SOLN
INTRAMUSCULAR | Status: AC
  Filled 2016-05-28: qty 1

## 2016-05-28 NOTE — Patient Instructions (Signed)
[facility] Discharge Instructions for Patients Receiving Chemotherapy  Today you received the following chemotherapy agents:  Taxol  To help prevent nausea and vomiting after your treatment, we encourage you to take your nausea medication as ordered per MD.   If you develop nausea and vomiting that is not controlled by your nausea medication, call the clinic.   BELOW ARE SYMPTOMS THAT SHOULD BE REPORTED IMMEDIATELY:  *FEVER GREATER THAN 100.5 F  *CHILLS WITH OR WITHOUT FEVER  NAUSEA AND VOMITING THAT IS NOT CONTROLLED WITH YOUR NAUSEA MEDICATION  *UNUSUAL SHORTNESS OF BREATH  *UNUSUAL BRUISING OR BLEEDING  TENDERNESS IN MOUTH AND THROAT WITH OR WITHOUT PRESENCE OF ULCERS  *URINARY PROBLEMS  *BOWEL PROBLEMS  UNUSUAL RASH Items with * indicate a potential emergency and should be followed up as soon as possible.  Feel free to call the clinic you have any questions or concerns. The clinic phone number is [phone].  Please show the CHEMO ALERT CARD at check-in to the Emergency Department and triage nurse.   

## 2016-05-31 ENCOUNTER — Telehealth: Payer: Self-pay | Admitting: *Deleted

## 2016-05-31 ENCOUNTER — Telehealth: Payer: Self-pay | Admitting: Hematology

## 2016-05-31 NOTE — Telephone Encounter (Signed)
Spoke with [employer] in [employer] about rescheduling PET scan to an earlier date before office visit on 12/26.  Was given date and time for  Wed  06/05/16 at 730 am;  Pt to arrive at 7 am that morning.  Pt to be NPO after  MN, and NO Insulin meds. Attempted to call pt at home, all daughters listed phone numbers unsuccessfully.  Only able to leave voice mail on husband cell phone requesting a call back from pt ASAP.

## 2016-05-31 NOTE — Telephone Encounter (Signed)
Echo to [facility] for preauth.

## 2016-06-03 ENCOUNTER — Telehealth: Payer: Self-pay | Admitting: *Deleted

## 2016-06-03 NOTE — Telephone Encounter (Signed)
Called pt & informed of change in PET scan.  She is able to come Wed 06/05/16 @ 7:30 am.  Informed NPO after MN & no insulin drugs (which she is not on).

## 2016-06-05 ENCOUNTER — Encounter (HOSPITAL_COMMUNITY)
Admission: RE | Admit: 2016-06-05 | Discharge: 2016-06-05 | Disposition: A | Discharge status: Home / Self Care | Payer: Commercial Managed Care - HMO | Source: Ambulatory Visit | Attending: Hematology | Admitting: Hematology

## 2016-06-05 DIAGNOSIS — C799 Secondary malignant neoplasm of unspecified site: Secondary | ICD-10-CM | POA: Diagnosis not present

## 2016-06-05 DIAGNOSIS — C50911 Malignant neoplasm of unspecified site of right female breast: Secondary | ICD-10-CM | POA: Diagnosis not present

## 2016-06-05 LAB — GLUCOSE, CAPILLARY: Glucose-Capillary: 95 mg/dL (ref 65–99)

## 2016-06-05 MED ORDER — FLUDEOXYGLUCOSE F - 18 (FDG) INJECTION
9.1700 | Freq: Once | INTRAVENOUS | Status: AC | PRN
  Administered 2016-06-05: 9.17 via INTRAVENOUS

## 2016-06-11 ENCOUNTER — Encounter: Payer: Self-pay | Admitting: Hematology

## 2016-06-11 ENCOUNTER — Other Ambulatory Visit: Payer: Self-pay | Admitting: Hematology

## 2016-06-11 ENCOUNTER — Other Ambulatory Visit: Payer: Self-pay | Admitting: *Deleted

## 2016-06-11 ENCOUNTER — Ambulatory Visit: Payer: Commercial Managed Care - HMO

## 2016-06-11 ENCOUNTER — Ambulatory Visit (HOSPITAL_BASED_OUTPATIENT_CLINIC_OR_DEPARTMENT_OTHER): Payer: Commercial Managed Care - HMO | Admitting: Hematology

## 2016-06-11 ENCOUNTER — Telehealth: Payer: Self-pay | Admitting: Hematology

## 2016-06-11 ENCOUNTER — Telehealth: Payer: Self-pay | Admitting: *Deleted

## 2016-06-11 ENCOUNTER — Other Ambulatory Visit (HOSPITAL_BASED_OUTPATIENT_CLINIC_OR_DEPARTMENT_OTHER): Payer: Commercial Managed Care - HMO

## 2016-06-11 VITALS — BP 148/72 | HR 84 | Temp 97.9°F | Resp 18 | Ht 64.0 in | Wt 192.0 lb

## 2016-06-11 DIAGNOSIS — C50911 Malignant neoplasm of unspecified site of right female breast: Secondary | ICD-10-CM

## 2016-06-11 DIAGNOSIS — I10 Essential (primary) hypertension: Secondary | ICD-10-CM

## 2016-06-11 DIAGNOSIS — G4733 Obstructive sleep apnea (adult) (pediatric): Secondary | ICD-10-CM

## 2016-06-11 DIAGNOSIS — N183 Chronic kidney disease, stage 3 unspecified: Secondary | ICD-10-CM

## 2016-06-11 DIAGNOSIS — Z171 Estrogen receptor negative status [ER-]: Secondary | ICD-10-CM

## 2016-06-11 DIAGNOSIS — C50811 Malignant neoplasm of overlapping sites of right female breast: Secondary | ICD-10-CM | POA: Diagnosis not present

## 2016-06-11 DIAGNOSIS — D630 Anemia in neoplastic disease: Secondary | ICD-10-CM

## 2016-06-11 DIAGNOSIS — E669 Obesity, unspecified: Secondary | ICD-10-CM

## 2016-06-11 DIAGNOSIS — C773 Secondary and unspecified malignant neoplasm of axilla and upper limb lymph nodes: Secondary | ICD-10-CM

## 2016-06-11 LAB — COMPREHENSIVE METABOLIC PANEL
ALBUMIN: 3.7 g/dL (ref 3.5–5.0)
ALK PHOS: 58 U/L (ref 40–150)
ALT: 15 U/L (ref 0–55)
ANION GAP: 10 meq/L (ref 3–11)
AST: 13 U/L (ref 5–34)
BILIRUBIN TOTAL: 0.47 mg/dL (ref 0.20–1.20)
BUN: 21.2 mg/dL (ref 7.0–26.0)
CO2: 25 meq/L (ref 22–29)
CREATININE: 1.1 mg/dL (ref 0.6–1.1)
Calcium: 9.4 mg/dL (ref 8.4–10.4)
Chloride: 108 mEq/L (ref 98–109)
EGFR: 58 mL/min/{1.73_m2} — AB (ref >90)
GLUCOSE: 93 mg/dL (ref 70–140)
Potassium: 3.9 mEq/L (ref 3.5–5.1)
Sodium: 143 mEq/L (ref 136–145)
TOTAL PROTEIN: 7.7 g/dL (ref 6.4–8.3)

## 2016-06-11 LAB — CBC WITH DIFFERENTIAL/PLATELET
BASO%: 1.3 % (ref 0.0–2.0)
Basophils Absolute: 0.1 10*3/uL (ref 0.0–0.1)
EOS ABS: 0.1 10*3/uL (ref 0.0–0.5)
EOS%: 1.1 % (ref 0.0–7.0)
HCT: 35.9 % (ref 34.8–46.6)
HEMOGLOBIN: 11.5 g/dL — AB (ref 11.6–15.9)
LYMPH#: 0.7 10*3/uL — AB (ref 0.9–3.3)
LYMPH%: 15.2 % (ref 14.0–49.7)
MCH: 28.3 pg (ref 25.1–34.0)
MCHC: 32 g/dL (ref 31.5–36.0)
MCV: 88.4 fL (ref 79.5–101.0)
MONO#: 0.6 10*3/uL (ref 0.1–0.9)
MONO%: 12.7 % (ref 0.0–14.0)
NEUT%: 69.7 % (ref 38.4–76.8)
NEUTROS ABS: 3.3 10*3/uL (ref 1.5–6.5)
PLATELETS: 257 10*3/uL (ref 145–400)
RBC: 4.06 10*6/uL (ref 3.70–5.45)
RDW: 14.5 % (ref 11.2–14.5)
WBC: 4.7 10*3/uL (ref 3.9–10.3)

## 2016-06-11 MED ORDER — TRAMADOL HCL 50 MG PO TABS: 50.0000 mg | ORAL_TABLET | Freq: Three times a day (TID) | ORAL | 1 refills | Status: DC | PRN

## 2016-06-11 NOTE — Telephone Encounter (Signed)
Appointments scheduled per 12/26 LOS. Patient given AVS report and calendars with future scheduled appointments. °

## 2016-06-11 NOTE — Progress Notes (Signed)
Called in script for Tramadol to [place] as per md's instructions.

## 2016-06-11 NOTE — Progress Notes (Signed)
We will arrive  [facility]  [phone] [phone]  Clinic Follow Up Note   Patient Care Team: [name], MD as PCP - General (Internal Medicine) 06/11/2016   CHIEF COMPLAINTS:  Follow up right breast cancer   Oncology History   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 [facility])   Staging form: Breast, AJCC 7th Edition   - Clinical stage from 11/30/2015: Stage IV (T4d, N3c, M1) - Signed by Truitt Merle, MD on 01/05/2016      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 [facility])   11/30/2015 Initial Diagnosis    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 [facility])      11/30/2015 Initial Biopsy    Right axilla lymph node biopsy showed metastatic poorly differentiated adenocarcinoma, IHC positive for CK AE1/AE3, CK7, E-cadherin, CDX-2, and focally very weak to equivocal staining for ER      12/21/2015 Imaging    Bilateral breast MRI showed extensive non-mass enhancement involving the entire right breast, right axillary, supraclavicular, and internal mammary adenopathy, new left axillary adenopathy      12/27/2015 Mammogram    Diagnostic mammogram and ultrasound of right breast and axilla showed diffuse skin thickening and increased density, no focal mass, numerous enlarged right axillary lymph node, the largest measuring 5.3cm.       12/27/2015 Initial Biopsy    Right breast core needle biopsy showed invasive ductal carcinoma, and DCIS, lymphovascular invasion identified      12/27/2015 Receptors her2    ER negative, PR negative, Ki-67 70%, HER-2 negative      01/03/2016 Imaging    PET scan showed diffuse asymmetric right breast hypermetabolic soft tissue density and skin thickening, hypermetabolic metastatic lymphadenopathy in bilateral axillary and subpectoral regions, right supraclavicular region and the right cervical level 5      01/10/2016 - 05/28/2016 Chemotherapy    Weekly Taxol 80 mg/m,  second cycle was postponed to 8/22 due to pt's non-compliance, stopped due to disease progression. She developed mild peripheral neuropathy in early Dec 2017.      02/07/2016 Pathology Results    Left axillary lymph node biopsy showed metastatic poorly differentiated carcinoma, triple negative.      04/24/2016 Imaging    CT CHEST, ABDOMEN AND PELVIS WITH CONTRAST IMPRESSION: 1. Bulky right axillary and mild right retropectoral and right supraclavicular lymphadenopathy is mildly decreased since 01/03/2016 PET-CT. 2. Left axillary lymphadenopathy is mildly increased. 3. Diffuse asymmetric thickening of the skin and fibroglandular soft tissues of the right breast is not appreciably changed. 4. No definite additional sites of metastatic disease in the chest, abdomen or pelvis. 5. Subcentimeter right liver lobe lesion is too small to characterize and is probably stable. 6. Additional findings include aortic atherosclerosis, 1 vessel coronary atherosclerosis, tiny hiatal hernia and myomatous uterus.      06/05/2016 Imaging    PET scan showed progressive right breast mass, extending in size and metabolic activity. Enlarging bilateral axillary adenopathy likewise in increased activity. A right supraclavicular lymph node is slightly smaller than before, no other new metastasis.       HISTORY OF PRESENTING ILLNESS (12/14/2015) :  [name] 70 y.o. 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 She is accompanied by her husband to the clinic today.  She noticed a lump at right axilla in 08/2015, no pain or other complains, she also report right breast swelling, no  pain, skin erythema, nipple discharge or other complain. She otherwise feels well. She was initially seen at urgent care in March, then was referred to establish her care with prior care physician at [facility] internal medicine center. Her screening mammogram was negative in January 2017. She  subsequently underwent CT chest on 09/27/2015 which showed enlarged and inflamed right breast, bulky right axillary lymph nodes. She was referred to breast Center for diagnostic mammogram and ultrasound of right breast, which was negative. She underwent ultrasound guided right axillary node biopsy on 12/05/2015, which reviewed port differentiated carcinoma. ER weakly focally positive.  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 She works independently. She lives with her husband. She denies any fever, night sweats, chills, or skin itchiness.  CURRENT THERAPY: chemotherapy with weekly Taxol, started on 01/10/2016, second cycle was delayed due to pt's non-compliance   INTERIM HISTORY:  Fusako returns for follow up and discuss PET scan findings. She is accompanied by her daughter and husband to clinic today. She reports her right breast has been more swelling, firm and painful. No other new complains. She has not taken any pain meds latley.   MEDICAL HISTORY:  Past Medical History:  Diagnosis Date  . Arthritis   . GERD (gastroesophageal reflux disease)   . Hypertension   . Shortness of breath   . Sleep apnea   . Stroke [facility])     SURGICAL HISTORY: Past Surgical History:  Procedure Laterality Date  . NO PAST SURGERIES      SOCIAL HISTORY: Social History   Social History  . Marital status: Married    Spouse name: N/A  . Number of children: 4  . Years of education: 8 TH   Occupational History  . Not on file.   Social History Main Topics  . Smoking status: Former Smoker    Packs/day: 0.50    Years: 1.00    Types: Cigarettes    Start date: 06/17/1968    Quit date: 06/17/1973  .  Smokeless tobacco: Never Used     Comment: Quit in 73s  . Alcohol use No     Comment: former drinker for approximately 10 years  . Drug use: No  . Sexual activity: Not on file   Other Topics Concern  . Not on file   Social History Narrative   Patient is married with 3 children still living and 1 deceased son.   Patient is right handed.   Patient has 8 th grade education.   Patient drinks 2-3 servings daily.    FAMILY HISTORY: Family History  Problem Relation Age of Onset  . Heart disease Mother   . Cancer Father 84    throat cancer  . Cancer Cousin     breast cancer    ALLERGIES:  has No Known Allergies.  MEDICATIONS:  Current Outpatient Prescriptions  Medication Sig Dispense Refill  . amLODipine (NORVASC) 10 MG tablet Take 1 tablet (10 mg total) by mouth daily. 90 tablet 3  . aspirin EC 81 MG tablet Take 1 tablet (81 mg total) by mouth daily. 90 tablet 3  . atorvastatin (LIPITOR) 20 MG tablet TAKE  1 TABLET EVERY DAY 90 tablet 3  . furosemide (LASIX) 20 MG tablet 20 mg daily.     . metoprolol tartrate (LOPRESSOR) 25 MG tablet Take 1 tablet (25 mg total) by mouth daily. 90 tablet 0  . traMADol (ULTRAM) 50 MG tablet Take 1 tablet (50 mg total) by mouth every 8 (eight) hours as needed. 60 tablet 1   No current facility-administered medications for this visit.        REVIEW OF SYSTEMS: Constitutional: Denies fevers, chills or abnormal night sweats Eyes: Denies blurriness of vision, double vision or watery eyes Ears, nose, mouth, throat, and face: Denies mucositis or sore throat Respiratory: Denies cough, dyspnea or wheezes Cardiovascular: Denies palpitation, chest discomfort or lower extremity swelling Gastrointestinal:  Denies nausea, heartburn or change in bowel habits Skin: (+) Rash on her right hand. (+) Dry skin on bilateral feet. Lymphatics: Denies new lymphadenopathy or easy bruising Neurological: (+) Tingling toes. Denies numbness. Behavioral/Psych: Mood is  stable, no new changes  All other systems were reviewed with the patient and are negative.  PHYSICAL EXAMINATION: ECOG PERFORMANCE STATUS: 1  Vitals:   06/11/16 0854  BP: (!) 148/72  Pulse: 84  Resp: 18  Temp: 97.9 F (36.6 C)   Filed Weights   06/11/16 0854  Weight: 192 lb (87.1 kg)    GENERAL:alert, no distress and comfortable SKIN: skin color, texture, turgor are normal, no rashes or significant lesions EYES: normal, conjunctiva are pink and non-injected, sclera clear OROPHARYNX:no exudate, no erythema and lips, buccal mucosa, and tongue normal  NECK: supple, thyroid normal size, non-tender, without nodularity LYMPH:  no palpable lymphadenopathy in the cervical, axillary or inguinal LUNGS: clear to auscultation and percussion with normal breathing effort HEART: regular rate & rhythm and no murmurs and no lower extremity edema ABDOMEN:abdomen soft, non-tender and normal bowel sounds Musculoskeletal:no cyanosis of digits and no clubbing  PSYCH: alert & oriented x 3 with fluent speech NEURO: Decreased vibration sensation on bilateral ankle and wrist. SKIN: Dry skin of the bilateral feet. Rash on the right palm, skin peeling, no discharge. EXTREMITIES: Bilateral hand and lower extremity edema. Breasts: Palpable mass measuring 10 x 10 cm in the center of the right breast. With associated skin erythema and edema. There is a 3.0x3.5cm (initially 6X6.5cm) mass at the anterior right axilla, close to right chest wall of breast margin, no tenderness, left axilla was negative.   LABORATORY DATA:  I have reviewed the data as listed CBC Latest Ref Rng & Units 06/11/2016 05/28/2016 05/21/2016  WBC 3.9 - 10.3 10e3/uL 4.7 3.5(L) 3.5(L)  Hemoglobin 11.6 - 15.9 g/dL 11.5(L) 11.3(L) 10.8(L)  Hematocrit 34.8 - 46.6 % 35.9 35.2 33.5(L)  Platelets 145 - 400 10e3/uL 257 290 260   CMP Latest Ref Rng & Units 06/11/2016 05/28/2016 05/21/2016  Glucose 70 - 140 mg/dl 93 100 87  BUN 7.0 - 26.0 mg/dL  21.2 25.3 27.0(H)  Creatinine 0.6 - 1.1 mg/dL 1.1 1.3(H) 1.2(H)  Sodium 136 - 145 mEq/L 143 142 142  Potassium 3.5 - 5.1 mEq/L 3.9 4.1 4.3  Chloride 96 - 106 mmol/L - - -  CO2 22 - 29 mEq/L _0 Calcium 8.4 - 10.4 mg/dL 9.4 9.5 9.2  Total Protein 6.4 - 8.3 g/dL 7.7 7.6 7.3  Total Bilirubin 0.20 - 1.20 mg/dL 0.47 0.51 0.43  Alkaline Phos 40 - 150 U/L 58 54 52  AST 5 - 34 U/L _1 ALT 0 - 55 U/L 15 11  12    CA27.29: 01/05/2016: 155.9 02/27/2016: 96.7 03/26/2016: 64.7 04/23/2016: 65.4 05/21/2016: 99.2   PATHOLOGY REPORT: Diagnosis 11/30/2015 Lymph node, needle/core biopsy, right breast/axilla - LYMPH NODE POSITIVE FOR METASTATIC POORLY DIFFERENTIATED CARCINOMA. - SEE COMMENT. Microscopic Comment Immunohistochemical stains are performed. The tumor is positive for cytokeratin AE1/AE3, cytokeratin 7 and E-cadherin. CDX-2 demonstrates weak positivity. Estrogen receptor demonstrates focal very weak to equivocal staining. Gross cystic disease fluid protein, cytokeratin 20, TTF-1, Melan-A and S100 are all negative. The findings are consistent with a poorly differentiated carcinoma. Given the staining pattern, an upper gastrointestinal and pancreatobiliary primary source should be ruled out. In addition, given the axillary location of the biopsy and focal weak to equivocal estrogen receptor staining, ruling out a poorly differentiated carcinoma of breast primary source is also prudent. Lastly, a gynecologic primary may be considered. Correlation with clinical and radiologic impression is essential. Dr. Vicente Males has seen this case in consultation with essential agreement of the above diagnosis and comment. The findings are called to the [place] on 12/04/15. (RAH:gt, 12/04/15)  Diagnosis 12/27/2015 Breast, right, needle core biopsy, central - INVASIVE MAMMARY CARCINOMA. - MAMMARY CARCINOMA IN SITU. - LYMPH/VASCULAR INVASION IS IDENTIFIED. - SEE  COMMENT. Microscopic Comment An E-cadherin stain will be performed to determine if the carcinoma is ductal or lobular in nature. The results of the stain will be reported in an addendum to follow. Although definitive grading of breast carcinoma is best done on excision, the features of the invasive tumor from the right central breast biopsy are compatible with a grade 3 breast carcinoma. Breast prognostic markers will be performed and reported in an addendum. Findings are called to the [place] on 12/28/2015. Dr. Lyndon Code has seen this case in consultation with agreement. (RH:kh 12-28-15) ADDENDUM: An E-cadherin immunohistochemical stain is performed which is positive in both the invasive and in situ components confirming the ductal nature of both (i.e. invasive ductal carcinoma with ductal carcinoma in situ). (RAH:gt, 12/29/15) PROGNOSTIC INDICATORS Results: IMMUNOHISTOCHEMICAL AND MORPHOMETRIC ANALYSIS PERFORMED MANUALLY Estrogen Receptor: 0%, NEGATIVE Progesterone Receptor: 0%, NEGATIVE Proliferation Marker Ki67: 70% Results: HER2 - NEGATIVE RATIO OF HER2/CEP17 SIGNALS 1.13 AVERAGE HER2 COPY NUMBER PER CELL 1.75  Diagnosis 02/07/2016 Lymph node, needle/core biopsy, left axillary - METASTATIC POORLY DIFFERENTIATED CARCINOMA. Results: IMMUNOHISTOCHEMICAL AND MORPHOMETRIC ANALYSIS PERFORMED MANUALLY Estrogen Receptor: 0%, NEGATIVE Progesterone Receptor: 0%, NEGATIVE Results: HER2 - NEGATIVE RATIO OF HER2/CEP17 SIGNALS 1.21 AVERAGE HER2 COPY NUMBER PER CELL 2.05  RADIOGRAPHIC STUDIES: I have personally reviewed the radiological images as listed and agreed with the findings in the report. Nm Pet Image Restag (ps) Skull Base To Thigh  Result Date: 06/05/2016 CLINICAL DATA:  Subsequent treatment strategy for right breast cancer. EXAM: NUCLEAR MEDICINE PET SKULL BASE TO THIGH TECHNIQUE: 9.2 mCi F-18 FDG was injected intravenously. Full-ring PET imaging was performed from  the skull base to thigh after the radiotracer. CT data was obtained and used for attenuation correction and anatomic localization. FASTING BLOOD GLUCOSE:  Value: 95 mg/dl COMPARISON:  01/03/2016 FINDINGS: NECK Right supraclavicular node 1.1 cm in short axis on image 40/4 (previously 1.5 cm) with maximum standard uptake value ule 11.8 (formerly 8.5). CHEST Hypermetabolic proximally 7.3 by 5.4 cm right breast mass appears more densely hypermetabolic compared the prior exam with a maximum SUV today of 33.2, previously 10.9. Increased nodularity and medial extension of the mass, increased extension down to the superficial margin of the pectoralis major muscle, increased subareolar extension, increased overlying skin thickening.  Bilateral subpectoral and axillary adenopathy is worsened. The dominant right axillary node measures 3.8 cm in short axis (formerly 3.4 cm) and has a maximum standard uptake value of 29.6 (formerly 26.1). An index left axillary lymph node measures 1.5 cm in short axis (formerly 0.9 cm) and has a maximum standard uptake value of 28.3 (formerly 17.2). Coronary, aortic arch, and branch vessel atherosclerotic vascular disease. There is extensive edema along the anterior chest including both breasts, greater on the right than the left. ABDOMEN/PELVIS No abnormal hypermetabolic activity within the liver, pancreas, adrenal glands, or spleen. No hypermetabolic lymph nodes in the abdomen or pelvis. Aortoiliac atherosclerotic vascular disease. SKELETON No focal hypermetabolic activity to suggest skeletal metastasis. IMPRESSION: 1. Progressive right breast mass, expanding in size and metabolic activity. Enlarging but bilateral axillary adenopathy likewise increased activity. A right supraclavicular lymph node is slightly smaller than previous but has a higher standard uptake value. Overall appearance compatible with progression but no further new metastatic spread compared to 01/03/2016. 2. Coronary, aortic  arch, and branch vessel atherosclerotic vascular disease. Aortoiliac atherosclerotic vascular disease. 3. Extensive edema along the anterior chest wall especially both breasts, right greater than left. Electronically Signed   By: Van Clines M.D.   On: 06/05/2016 11:23    ASSESSMENT & PLAN:  70 y.o. female, with past medical history of HTN, GERD, sleep apnea, presented with bulky right axillary adenopathy.  1. Primary cancer of right breast with metastasis to distant lymph nodes, invasive ductal carcinoma and DCIS, ER-/PR-/HER2-,  [id number], stage IV  -I previously reviewed her mammogram, ultrasound and a CT chest, and biopsy pathology findings with patient and her husband in details -I previously discussed her breast MRI, PET scan, and breast biopsy results in great details with patient and her family members  -Her breast MRI findings and breast biopsy confirmed primary breast cancer, triple negative. Unfortunately her cancer has metastasized to left axilla and cervical lymph nodes, which are distant metastasis.  -We previously reviewed the natural history of metastatic triple negative breast cancer, which is very aggressive, and her cancer is incurable at this stage  -Giving the metastatic disease, surgery up front is not indicated, I recommended systemic chemotherapy. -She has been on weekly Taxol for 4-5 month -I previously reviewed her restaging CT scan findings from 04/24/16, which showed slight improvement in the right-sided adenopathy, no other new metastasis. -I reviewed her recent restaging PET scan, which showed significant cancer progression in the right breast, no other sternal metastasis. - I recommend swiitch her chemotherapy to Adriamycin and Cytoxan, for rapid disease control, to prevent bleeding and infection from the primary breast cancer. -alternative options, such as either single agent chemotherapy, palliative radiation, clinical trial options (unfortunately the [facility]  has no open slot now) were also discussed with pt. She agree with [facility]  --Chemotherapy consent: Side effects including but does not not limited to, fatigue, nausea, vomiting, diarrhea, hair loss, neuropathy, fluid retention, renal and kidney dysfunction, neutropenic fever, needed for blood transfusion, bleeding, congestive heart failure, secondary to leukemia and MDS, were discussed with patient in great detail. She agrees to proceed. -Echocardiogram before fourth cycle chemotherapy -Port placement. I had lengthy discussion about the port with patient, she finally agreed. -will start first cycle AC in a week   2. ANEMIA in neoplastic disease -She developed normocytic mild anemia in the past few months. -Her anemia workup shows a normal folic acid and L93 level, increased ferritin, decreased serum iron, TIBC and transferrin saturation. This is  most consistent with anemia of chronic disease, secondary to her CKD and underlying malignancy.  -I previously encouraged her to continue oral iron supplement  3. CKD stage III -Her GFR is in 40-50's  -No lab signs of tumor lysis -Possibly related to her underlying hypertension and diuretics  -Her Cr has much improved since she stopped her Lasix. -Avoid nephrotoxins, such as NSAIDs and IV CT contrast  4. HTN, GERD, obesity, sleep apnea -She'll continue follow-up with her primary care physician  5. Right breast pain -secondary to breast cancer -I refilled her tramadol   PLAN -PET scan reviewed  -ECHO asap -port placement by IR asap -first cycle AC in a week  -I plan to see her back before second cycle chemo  I spent 30 minutes counseling the patient face to face. The total time spent in the appointment was 35 minute s and more than 50% was on counseling.     Truitt Merle, MD 06/11/2016   This document serves as a record of services personally performed by Truitt Merle, MD. It was created on her behalf by Darcus Austin, a trained medical scribe. The  creation of this record is based on the scribe's personal observations and the provider's statements to them. This document has been checked and approved by the attending provider.

## 2016-06-11 NOTE — Telephone Encounter (Signed)
Spoke with [name] at [facility] IR, and was given appt date and time for pt's port insertion on Thursday  06/13/16 at  29 am.   [name] stated she would contact pt on Wed 12/27 to give further instructions about procedure. Spoke with [name] today, and informed him that radiology scheduler will contact pt in the am for new appt date and time for port insertion.  Reinforced with [name] that pt needs to answer phone in the morning for instructions.  [name] voiced understanding and stated he would relay message to pt.

## 2016-06-12 ENCOUNTER — Other Ambulatory Visit: Payer: Self-pay | Admitting: Radiology

## 2016-06-12 NOTE — Telephone Encounter (Signed)
Spoke with patient and husband re echo @ [facility] 12/29 @ 10 am.

## 2016-06-13 ENCOUNTER — Other Ambulatory Visit: Payer: Self-pay | Admitting: Hematology

## 2016-06-13 ENCOUNTER — Ambulatory Visit (HOSPITAL_COMMUNITY)
Admission: RE | Admit: 2016-06-13 | Discharge: 2016-06-13 | Disposition: A | Discharge status: Home / Self Care | Payer: Commercial Managed Care - HMO | Source: Ambulatory Visit | Attending: Hematology | Admitting: Hematology

## 2016-06-13 ENCOUNTER — Encounter (HOSPITAL_COMMUNITY): Payer: Self-pay

## 2016-06-13 DIAGNOSIS — Z87891 Personal history of nicotine dependence: Secondary | ICD-10-CM | POA: Insufficient documentation

## 2016-06-13 DIAGNOSIS — M1990 Unspecified osteoarthritis, unspecified site: Secondary | ICD-10-CM | POA: Insufficient documentation

## 2016-06-13 DIAGNOSIS — Z803 Family history of malignant neoplasm of breast: Secondary | ICD-10-CM | POA: Diagnosis not present

## 2016-06-13 DIAGNOSIS — K219 Gastro-esophageal reflux disease without esophagitis: Secondary | ICD-10-CM | POA: Insufficient documentation

## 2016-06-13 DIAGNOSIS — C50911 Malignant neoplasm of unspecified site of right female breast: Secondary | ICD-10-CM

## 2016-06-13 DIAGNOSIS — Z9221 Personal history of antineoplastic chemotherapy: Secondary | ICD-10-CM | POA: Insufficient documentation

## 2016-06-13 DIAGNOSIS — I10 Essential (primary) hypertension: Secondary | ICD-10-CM | POA: Insufficient documentation

## 2016-06-13 DIAGNOSIS — Z8249 Family history of ischemic heart disease and other diseases of the circulatory system: Secondary | ICD-10-CM | POA: Diagnosis not present

## 2016-06-13 DIAGNOSIS — Z801 Family history of malignant neoplasm of trachea, bronchus and lung: Secondary | ICD-10-CM | POA: Diagnosis not present

## 2016-06-13 DIAGNOSIS — Z452 Encounter for adjustment and management of vascular access device: Secondary | ICD-10-CM | POA: Diagnosis not present

## 2016-06-13 DIAGNOSIS — C773 Secondary and unspecified malignant neoplasm of axilla and upper limb lymph nodes: Secondary | ICD-10-CM | POA: Diagnosis not present

## 2016-06-13 DIAGNOSIS — Z8673 Personal history of transient ischemic attack (TIA), and cerebral infarction without residual deficits: Secondary | ICD-10-CM | POA: Insufficient documentation

## 2016-06-13 DIAGNOSIS — G4730 Sleep apnea, unspecified: Secondary | ICD-10-CM | POA: Diagnosis not present

## 2016-06-13 DIAGNOSIS — Z7982 Long term (current) use of aspirin: Secondary | ICD-10-CM | POA: Insufficient documentation

## 2016-06-13 HISTORY — PX: IR GENERIC HISTORICAL: IMG1180011

## 2016-06-13 LAB — CBC
HCT: 35.2 % — ABNORMAL LOW (ref 36.0–46.0)
HEMOGLOBIN: 11.3 g/dL — AB (ref 12.0–15.0)
MCH: 28.2 pg (ref 26.0–34.0)
MCHC: 32.1 g/dL (ref 30.0–36.0)
MCV: 87.8 fL (ref 78.0–100.0)
Platelets: 247 10*3/uL (ref 150–400)
RBC: 4.01 MIL/uL (ref 3.87–5.11)
RDW: 14.3 % (ref 11.5–15.5)
WBC: 4.3 10*3/uL (ref 4.0–10.5)

## 2016-06-13 LAB — BASIC METABOLIC PANEL
ANION GAP: 8 (ref 5–15)
BUN: 23 mg/dL — ABNORMAL HIGH (ref 6–20)
CALCIUM: 9.1 mg/dL (ref 8.9–10.3)
CO2: 27 mmol/L (ref 22–32)
Chloride: 106 mmol/L (ref 101–111)
Creatinine, Ser: 1.03 mg/dL — ABNORMAL HIGH (ref 0.44–1.00)
GFR, EST NON AFRICAN AMERICAN: 54 mL/min — AB (ref >60)
Glucose, Bld: 89 mg/dL (ref 65–99)
Potassium: 4.2 mmol/L (ref 3.5–5.1)
SODIUM: 141 mmol/L (ref 135–145)

## 2016-06-13 LAB — PROTIME-INR
INR: 1.04
PROTHROMBIN TIME: 13.7 s (ref 11.4–15.2)

## 2016-06-13 LAB — APTT: aPTT: 27 seconds (ref 24–36)

## 2016-06-13 MED ORDER — CEFAZOLIN SODIUM-DEXTROSE 2-4 GM/100ML-% IV SOLN: 2.0000 g | INTRAVENOUS | Status: DC

## 2016-06-13 MED ORDER — LIDOCAINE HCL (PF) 1 % IJ SOLN
INTRAMUSCULAR | Status: AC
  Filled 2016-06-13: qty 30

## 2016-06-13 MED ORDER — FENTANYL CITRATE (PF) 100 MCG/2ML IJ SOLN
INTRAMUSCULAR | Status: AC
  Filled 2016-06-13: qty 4

## 2016-06-13 MED ORDER — HEPARIN SOD (PORK) LOCK FLUSH 100 UNIT/ML IV SOLN
INTRAVENOUS | Status: AC
  Filled 2016-06-13: qty 5

## 2016-06-13 MED ORDER — FLUMAZENIL 0.5 MG/5ML IV SOLN
INTRAVENOUS | Status: AC
  Filled 2016-06-13: qty 5

## 2016-06-13 MED ORDER — SODIUM CHLORIDE 0.9 % IV SOLN: INTRAVENOUS | Status: DC

## 2016-06-13 MED ORDER — LIDOCAINE HCL (PF) 1 % IJ SOLN
INTRAMUSCULAR | Status: AC | PRN
  Administered 2016-06-13: 5 mL

## 2016-06-13 MED ORDER — MIDAZOLAM HCL 2 MG/2ML IJ SOLN
INTRAMUSCULAR | Status: AC | PRN
  Administered 2016-06-13: 0.5 mg via INTRAVENOUS
  Administered 2016-06-13: 1 mg via INTRAVENOUS

## 2016-06-13 MED ORDER — FENTANYL CITRATE (PF) 100 MCG/2ML IJ SOLN
INTRAMUSCULAR | Status: AC | PRN
  Administered 2016-06-13: 25 ug via INTRAVENOUS
  Administered 2016-06-13: 50 ug via INTRAVENOUS

## 2016-06-13 MED ORDER — MIDAZOLAM HCL 2 MG/2ML IJ SOLN
INTRAMUSCULAR | Status: AC
  Filled 2016-06-13: qty 4

## 2016-06-13 MED ORDER — CEFAZOLIN SODIUM-DEXTROSE 2-4 GM/100ML-% IV SOLN
INTRAVENOUS | Status: AC
  Administered 2016-06-13: 2000 mg
  Filled 2016-06-13: qty 100

## 2016-06-13 MED ORDER — NALOXONE HCL 0.4 MG/ML IJ SOLN
INTRAMUSCULAR | Status: AC
  Filled 2016-06-13: qty 1

## 2016-06-13 NOTE — Discharge Instructions (Signed)

## 2016-06-13 NOTE — Procedures (Signed)
Interventional Radiology Procedure Note  Procedure: Placement of a left IJ approach single lumen PowerPort.  Tip is positioned at the superior cavoatrial junction and catheter is ready for immediate use.  Complications: None Recommendations:  - Ok to shower tomorrow - Do not submerge for 7 days - Routine line care   Signed,  Joseangel Nettleton S. Achsah Mcquade, DO    

## 2016-06-13 NOTE — H&P (Signed)
Chief Complaint: Patient was seen in consultation today for port a cath placement at the request of Feng,Yan  Referring Physician(s): Feng,Yan  Supervising Physician: Corrie Mckusick  Patient Status: [facility] - Out-pt  History of Present Illness: [name] is a 70 y.o. female   Dx R Breast Ca 11/2015 Right axillary LAN metastasis On going chemo therapy now For [facility] placement today   Past Medical History:  Diagnosis Date  . Arthritis   . GERD (gastroesophageal reflux disease)   . Hypertension   . Shortness of breath   . Sleep apnea   . Stroke [facility])     Past Surgical History:  Procedure Laterality Date  . NO PAST SURGERIES      Allergies: Patient has no known allergies.  Medications: Prior to Admission medications   Medication Sig Start Date End Date Taking? Authorizing Provider  amLODipine (NORVASC) 10 MG tablet Take 1 tablet (10 mg total) by mouth daily. 08/28/15  Yes Bartholomew Crews, MD  aspirin EC 81 MG tablet Take 1 tablet (81 mg total) by mouth daily. 04/27/14  Yes Rosalin Hawking, MD  atorvastatin (LIPITOR) 20 MG tablet TAKE 1 TABLET EVERY DAY Patient taking differently: Take 20 mg by mouth daily 05/28/16  Yes [name], MD  furosemide (LASIX) 20 MG tablet Take 20 mg by mouth daily.  11/28/15  Yes Historical Provider, MD  metoprolol tartrate (LOPRESSOR) 25 MG tablet Take 1 tablet (25 mg total) by mouth daily. 05/03/16  Yes [name], MD  traMADol (ULTRAM) 50 MG tablet Take 1 tablet (50 mg total) by mouth every 8 (eight) hours as needed. Patient not taking: Reported on 06/13/2016 06/11/16   Truitt Merle, MD     Family History  Problem Relation Age of Onset  . Heart disease Mother   . Cancer Father 87    throat cancer  . Cancer Cousin     breast cancer    Social History   Social History  . Marital status: Married    Spouse name: N/A  . Number of children: 4  . Years of education: 8 TH   Social History Main Topics  . Smoking status: Former  Smoker    Packs/day: 0.50    Years: 1.00    Types: Cigarettes    Start date: 06/17/1968    Quit date: 06/17/1973  . Smokeless tobacco: Never Used     Comment: Quit in 88s  . Alcohol use No     Comment: former drinker for approximately 10 years  . Drug use: No  . Sexual activity: Not Asked   Other Topics Concern  . None   Social History Narrative   Patient is married with 3 children still living and 1 deceased son.   Patient is right handed.   Patient has 8 th grade education.   Patient drinks 2-3 servings daily.    Review of Systems: A 12 point ROS discussed and pertinent positives are indicated in the HPI above.  All other systems are negative.  Review of Systems  Constitutional: Positive for fatigue. Negative for activity change and fever.  Respiratory: Negative for shortness of breath.   Gastrointestinal: Negative for abdominal pain.  Musculoskeletal: Negative for back pain and gait problem.  Psychiatric/Behavioral: Negative for behavioral problems and confusion.    Vital Signs: BP (!) 158/80   Pulse 79   Temp 97.6 F (36.4 C) (Oral)   Resp 16   Ht 5\' 4"  (1.626 m)   Wt  185 lb (83.9 kg)   SpO2 99%   BMI 31.76 kg/m   Physical Exam  Constitutional: She is oriented to person, place, and time.  Cardiovascular: Normal rate, regular rhythm and normal heart sounds.   Pulmonary/Chest: Effort normal and breath sounds normal.  Abdominal: Soft. Bowel sounds are normal.  Musculoskeletal: Normal range of motion.  Neurological: She is alert and oriented to person, place, and time.  Skin: Skin is warm and dry.  Psychiatric: She has a normal mood and affect. Her behavior is normal. Judgment and thought content normal.  Nursing note and vitals reviewed.   Mallampati Score:  MD Evaluation Airway: WNL Heart: WNL Abdomen: WNL ASA  Classification: 3 Mallampati/Airway Score: Two  Imaging: Nm Pet Image Restag (ps) Skull Base To Thigh  Result Date: 06/05/2016 CLINICAL  DATA:  Subsequent treatment strategy for right breast cancer. EXAM: NUCLEAR MEDICINE PET SKULL BASE TO THIGH TECHNIQUE: 9.2 mCi F-18 FDG was injected intravenously. Full-ring PET imaging was performed from the skull base to thigh after the radiotracer. CT data was obtained and used for attenuation correction and anatomic localization. FASTING BLOOD GLUCOSE:  Value: 95 mg/dl COMPARISON:  01/03/2016 FINDINGS: NECK Right supraclavicular node 1.1 cm in short axis on image 40/4 (previously 1.5 cm) with maximum standard uptake value ule 11.8 (formerly 8.5). CHEST Hypermetabolic proximally 7.3 by 5.4 cm right breast mass appears more densely hypermetabolic compared the prior exam with a maximum SUV today of 33.2, previously 10.9. Increased nodularity and medial extension of the mass, increased extension down to the superficial margin of the pectoralis major muscle, increased subareolar extension, increased overlying skin thickening. Bilateral subpectoral and axillary adenopathy is worsened. The dominant right axillary node measures 3.8 cm in short axis (formerly 3.4 cm) and has a maximum standard uptake value of 29.6 (formerly 26.1). An index left axillary lymph node measures 1.5 cm in short axis (formerly 0.9 cm) and has a maximum standard uptake value of 28.3 (formerly 17.2). Coronary, aortic arch, and branch vessel atherosclerotic vascular disease. There is extensive edema along the anterior chest including both breasts, greater on the right than the left. ABDOMEN/PELVIS No abnormal hypermetabolic activity within the liver, pancreas, adrenal glands, or spleen. No hypermetabolic lymph nodes in the abdomen or pelvis. Aortoiliac atherosclerotic vascular disease. SKELETON No focal hypermetabolic activity to suggest skeletal metastasis. IMPRESSION: 1. Progressive right breast mass, expanding in size and metabolic activity. Enlarging but bilateral axillary adenopathy likewise increased activity. A right supraclavicular lymph  node is slightly smaller than previous but has a higher standard uptake value. Overall appearance compatible with progression but no further new metastatic spread compared to 01/03/2016. 2. Coronary, aortic arch, and branch vessel atherosclerotic vascular disease. Aortoiliac atherosclerotic vascular disease. 3. Extensive edema along the anterior chest wall especially both breasts, right greater than left. Electronically Signed   By: Van Clines M.D.   On: 06/05/2016 11:23    Labs:  CBC:  Recent Labs  05/14/16 0937 05/21/16 1227 05/28/16 1106 06/11/16 0831  WBC 3.6* 3.5* 3.5* 4.7  HGB 11.8 10.8* 11.3* 11.5*  HCT 36.4 33.5* 35.2 35.9  PLT 275 260 290 257    COAGS: No results for input(s): INR, APTT in the last 8760 hours.  BMP:  Recent Labs  09/18/15 0914 11/28/15 0056 12/06/15 1637  05/14/16 0937 05/21/16 1227 05/28/16 1106 06/11/16 0831  NA 141 138 146*  < > 142 142 142 143  K 4.9 3.8 4.8  < > 4.2 4.3 4.1 3.9  CL 101 109  105  --   --   --   --   --   CO2 23 23 25   < > 26 24 27 25   GLUCOSE 87 116* 88  < > 90 87 100 93  BUN 22 45* 33*  < > 23.8 27.0* 25.3 21.2  CALCIUM 9.6 9.0 9.3  < > 9.6 9.2 9.5 9.4  CREATININE 1.10* 1.33* 1.30*  < > 1.1 1.2* 1.3* 1.1  GFRNONAA 52* 40* 42*  --   --   --   --   --   GFRAA 60 46* 48*  --   --   --   --   --   < > = values in this interval not displayed.  LIVER FUNCTION TESTS:  Recent Labs  05/14/16 0937 05/21/16 1227 05/28/16 1106 06/11/16 0831  BILITOT 0.51 0.43 0.51 0.47  AST 14 13 13 13   ALT 17 12 11 15   ALKPHOS 51 52 54 58  PROT 7.4 7.3 7.6 7.7  ALBUMIN 3.6 3.5 3.5 3.7    TUMOR MARKERS: No results for input(s): AFPTM, CEA, CA199, CHROMGRNA in the last 8760 hours.  Assessment and Plan:  Breast Ca On going chemotherapy Port a cath placement today Risks and Benefits discussed with the patient including, but not limited to bleeding, infection, pneumothorax, or fibrin sheath development and need for additional  procedures. All of the patient's questions were answered, patient is agreeable to proceed. Consent signed and in chart.   Thank you for this interesting consult.  I greatly enjoyed meeting [name] and look forward to participating in their care.  A copy of this report was sent to the requesting provider on this date.  Electronically Signed: Vander Kueker A 06/13/2016, 10:14 AM   I spent a total of  30 Minutes   in face to face in clinical consultation, greater than 50% of which was counseling/coordinating care for [facility]

## 2016-06-14 ENCOUNTER — Other Ambulatory Visit: Payer: Self-pay | Admitting: *Deleted

## 2016-06-14 ENCOUNTER — Telehealth: Payer: Self-pay | Admitting: Hematology

## 2016-06-14 ENCOUNTER — Ambulatory Visit (HOSPITAL_BASED_OUTPATIENT_CLINIC_OR_DEPARTMENT_OTHER)
Admission: RE | Admit: 2016-06-14 | Discharge: 2016-06-14 | Disposition: A | Discharge status: Home / Self Care | Payer: Commercial Managed Care - HMO | Source: Ambulatory Visit | Attending: Hematology | Admitting: Hematology

## 2016-06-14 ENCOUNTER — Encounter (HOSPITAL_COMMUNITY): Payer: Commercial Managed Care - HMO

## 2016-06-14 DIAGNOSIS — M1990 Unspecified osteoarthritis, unspecified site: Secondary | ICD-10-CM | POA: Diagnosis not present

## 2016-06-14 DIAGNOSIS — C799 Secondary malignant neoplasm of unspecified site: Secondary | ICD-10-CM | POA: Diagnosis not present

## 2016-06-14 DIAGNOSIS — C773 Secondary and unspecified malignant neoplasm of axilla and upper limb lymph nodes: Secondary | ICD-10-CM | POA: Diagnosis not present

## 2016-06-14 DIAGNOSIS — K219 Gastro-esophageal reflux disease without esophagitis: Secondary | ICD-10-CM | POA: Diagnosis not present

## 2016-06-14 DIAGNOSIS — I10 Essential (primary) hypertension: Secondary | ICD-10-CM | POA: Diagnosis not present

## 2016-06-14 DIAGNOSIS — G4730 Sleep apnea, unspecified: Secondary | ICD-10-CM | POA: Diagnosis not present

## 2016-06-14 DIAGNOSIS — Z0189 Encounter for other specified special examinations: Secondary | ICD-10-CM

## 2016-06-14 DIAGNOSIS — C50911 Malignant neoplasm of unspecified site of right female breast: Secondary | ICD-10-CM

## 2016-06-14 DIAGNOSIS — Z7982 Long term (current) use of aspirin: Secondary | ICD-10-CM | POA: Diagnosis not present

## 2016-06-14 DIAGNOSIS — Z8249 Family history of ischemic heart disease and other diseases of the circulatory system: Secondary | ICD-10-CM | POA: Diagnosis not present

## 2016-06-14 DIAGNOSIS — Z8673 Personal history of transient ischemic attack (TIA), and cerebral infarction without residual deficits: Secondary | ICD-10-CM | POA: Diagnosis not present

## 2016-06-14 NOTE — Progress Notes (Signed)
  Echocardiogram 2D Echocardiogram has been performed.  [name] M 06/14/2016, 11:48 AM

## 2016-06-14 NOTE — Telephone Encounter (Signed)
SW PT TO CONFIRM R/S APPT TO 1/5 AT 1215 PM PER LOS

## 2016-06-15 ENCOUNTER — Other Ambulatory Visit: Payer: Self-pay | Admitting: Hematology

## 2016-06-15 NOTE — Progress Notes (Signed)
Patient on plan of care prior to pathways. 

## 2016-06-18 ENCOUNTER — Other Ambulatory Visit: Payer: Commercial Managed Care - HMO

## 2016-06-18 ENCOUNTER — Other Ambulatory Visit: Payer: Self-pay | Admitting: *Deleted

## 2016-06-18 ENCOUNTER — Ambulatory Visit: Payer: Commercial Managed Care - HMO

## 2016-06-18 MED ORDER — LIDOCAINE-PRILOCAINE 2.5-2.5 % EX CREA: TOPICAL_CREAM | CUTANEOUS | 0 refills | Status: DC

## 2016-06-19 ENCOUNTER — Other Ambulatory Visit: Payer: Self-pay | Admitting: *Deleted

## 2016-06-19 MED ORDER — LIDOCAINE-PRILOCAINE 2.5-2.5 % EX CREA: TOPICAL_CREAM | CUTANEOUS | 1 refills | Status: AC

## 2016-06-20 ENCOUNTER — Other Ambulatory Visit (HOSPITAL_COMMUNITY): Payer: Commercial Managed Care - HMO

## 2016-06-20 ENCOUNTER — Ambulatory Visit (HOSPITAL_COMMUNITY): Payer: Commercial Managed Care - HMO

## 2016-06-21 ENCOUNTER — Ambulatory Visit (HOSPITAL_BASED_OUTPATIENT_CLINIC_OR_DEPARTMENT_OTHER): Payer: Medicare HMO

## 2016-06-21 ENCOUNTER — Other Ambulatory Visit (HOSPITAL_BASED_OUTPATIENT_CLINIC_OR_DEPARTMENT_OTHER): Payer: Medicare HMO

## 2016-06-21 ENCOUNTER — Other Ambulatory Visit: Payer: Self-pay | Admitting: Hematology

## 2016-06-21 VITALS — BP 140/78 | HR 82 | Temp 98.7°F | Resp 18

## 2016-06-21 DIAGNOSIS — C50811 Malignant neoplasm of overlapping sites of right female breast: Secondary | ICD-10-CM | POA: Diagnosis not present

## 2016-06-21 DIAGNOSIS — C50911 Malignant neoplasm of unspecified site of right female breast: Secondary | ICD-10-CM

## 2016-06-21 DIAGNOSIS — C799 Secondary malignant neoplasm of unspecified site: Secondary | ICD-10-CM | POA: Diagnosis not present

## 2016-06-21 DIAGNOSIS — Z5111 Encounter for antineoplastic chemotherapy: Secondary | ICD-10-CM

## 2016-06-21 LAB — CBC WITH DIFFERENTIAL/PLATELET
BASO%: 1 % (ref 0.0–2.0)
Basophils Absolute: 0.1 10*3/uL (ref 0.0–0.1)
EOS ABS: 0.1 10*3/uL (ref 0.0–0.5)
EOS%: 1.5 % (ref 0.0–7.0)
HCT: 33 % — ABNORMAL LOW (ref 34.8–46.6)
HEMOGLOBIN: 10.5 g/dL — AB (ref 11.6–15.9)
LYMPH%: 14.9 % (ref 14.0–49.7)
MCH: 27.8 pg (ref 25.1–34.0)
MCHC: 31.8 g/dL (ref 31.5–36.0)
MCV: 87.3 fL (ref 79.5–101.0)
MONO#: 0.7 10*3/uL (ref 0.1–0.9)
MONO%: 14.3 % — AB (ref 0.0–14.0)
NEUT%: 68.3 % (ref 38.4–76.8)
NEUTROS ABS: 3.3 10*3/uL (ref 1.5–6.5)
Platelets: 245 10*3/uL (ref 145–400)
RBC: 3.78 10*6/uL (ref 3.70–5.45)
RDW: 13.9 % (ref 11.2–14.5)
WBC: 4.8 10*3/uL (ref 3.9–10.3)
lymph#: 0.7 10*3/uL — ABNORMAL LOW (ref 0.9–3.3)

## 2016-06-21 LAB — COMPREHENSIVE METABOLIC PANEL
ALBUMIN: 3.6 g/dL (ref 3.5–5.0)
ALK PHOS: 56 U/L (ref 40–150)
ALT: 10 U/L (ref 0–55)
AST: 11 U/L (ref 5–34)
Anion Gap: 9 mEq/L (ref 3–11)
BILIRUBIN TOTAL: 0.4 mg/dL (ref 0.20–1.20)
BUN: 26.4 mg/dL — AB (ref 7.0–26.0)
CO2: 27 meq/L (ref 22–29)
CREATININE: 1 mg/dL (ref 0.6–1.1)
Calcium: 9.1 mg/dL (ref 8.4–10.4)
Chloride: 106 mEq/L (ref 98–109)
EGFR: 65 mL/min/{1.73_m2} — ABNORMAL LOW (ref >90)
GLUCOSE: 88 mg/dL (ref 70–140)
Potassium: 4.3 mEq/L (ref 3.5–5.1)
SODIUM: 141 meq/L (ref 136–145)
TOTAL PROTEIN: 7.1 g/dL (ref 6.4–8.3)

## 2016-06-21 MED ORDER — PALONOSETRON HCL INJECTION 0.25 MG/5ML
0.2500 mg | Freq: Once | INTRAVENOUS | Status: AC
  Administered 2016-06-21: 0.25 mg via INTRAVENOUS

## 2016-06-21 MED ORDER — HEPARIN SOD (PORK) LOCK FLUSH 100 UNIT/ML IV SOLN
500.0000 [IU] | Freq: Once | INTRAVENOUS | Status: DC | PRN
  Filled 2016-06-21: qty 5

## 2016-06-21 MED ORDER — SODIUM CHLORIDE 0.9% FLUSH
10.0000 mL | INTRAVENOUS | Status: DC | PRN
  Filled 2016-06-21: qty 10

## 2016-06-21 MED ORDER — PALONOSETRON HCL INJECTION 0.25 MG/5ML
INTRAVENOUS | Status: AC
  Filled 2016-06-21: qty 5

## 2016-06-21 MED ORDER — SODIUM CHLORIDE 0.9 % IV SOLN
Freq: Once | INTRAVENOUS | Status: AC
  Administered 2016-06-21: 14:00:00 via INTRAVENOUS

## 2016-06-21 MED ORDER — SODIUM CHLORIDE 0.9 % IV SOLN
600.0000 mg/m2 | Freq: Once | INTRAVENOUS | Status: AC
  Administered 2016-06-21: 1180 mg via INTRAVENOUS
  Filled 2016-06-21: qty 59

## 2016-06-21 MED ORDER — DOXORUBICIN HCL CHEMO IV INJECTION 2 MG/ML
60.0000 mg/m2 | Freq: Once | INTRAVENOUS | Status: AC
  Administered 2016-06-21: 118 mg via INTRAVENOUS
  Filled 2016-06-21: qty 59

## 2016-06-21 MED ORDER — FOSAPREPITANT DIMEGLUMINE INJECTION 150 MG
Freq: Once | INTRAVENOUS | Status: AC
  Administered 2016-06-21: 15:00:00 via INTRAVENOUS
  Filled 2016-06-21: qty 5

## 2016-06-21 NOTE — Patient Instructions (Signed)
Cancer Center Discharge Instructions for Patients Receiving Chemotherapy  Today you received the following chemotherapy agents:Adriamycin and Cytoxan   To help prevent nausea and vomiting after your treatment, we encourage you to take your nausea medication as directed.    If you develop nausea and vomiting that is not controlled by your nausea medication, call the clinic.   BELOW ARE SYMPTOMS THAT SHOULD BE REPORTED IMMEDIATELY:  *FEVER GREATER THAN 100.5 F  *CHILLS WITH OR WITHOUT FEVER  NAUSEA AND VOMITING THAT IS NOT CONTROLLED WITH YOUR NAUSEA MEDICATION  *UNUSUAL SHORTNESS OF BREATH  *UNUSUAL BRUISING OR BLEEDING  TENDERNESS IN MOUTH AND THROAT WITH OR WITHOUT PRESENCE OF ULCERS  *URINARY PROBLEMS  *BOWEL PROBLEMS  UNUSUAL RASH Items with * indicate a potential emergency and should be followed up as soon as possible.  Feel free to call the clinic you have any questions or concerns. The clinic phone number is [phone].  Please show the CHEMO ALERT CARD at check-in to the Emergency Department and triage nurse.   

## 2016-06-21 NOTE — Progress Notes (Signed)
Swift blood return noted before, during and after Adriamycin. 

## 2016-06-22 LAB — CANCER ANTIGEN 27.29: CAN 27.29: 195 U/mL — AB (ref 0.0–38.6)

## 2016-06-24 ENCOUNTER — Ambulatory Visit (HOSPITAL_BASED_OUTPATIENT_CLINIC_OR_DEPARTMENT_OTHER): Payer: Medicare HMO

## 2016-06-24 VITALS — BP 140/82 | HR 80 | Temp 98.2°F | Resp 20

## 2016-06-24 DIAGNOSIS — Z5189 Encounter for other specified aftercare: Secondary | ICD-10-CM

## 2016-06-24 DIAGNOSIS — C50911 Malignant neoplasm of unspecified site of right female breast: Secondary | ICD-10-CM

## 2016-06-24 MED ORDER — PEGFILGRASTIM INJECTION 6 MG/0.6ML ~~LOC~~
6.0000 mg | PREFILLED_SYRINGE | Freq: Once | SUBCUTANEOUS | Status: AC
  Administered 2016-06-24: 6 mg via SUBCUTANEOUS
  Filled 2016-06-24: qty 0.6

## 2016-06-24 NOTE — Patient Instructions (Signed)
Pegfilgrastim injection What is this medicine? PEGFILGRASTIM (PEG fil gra stim) is a long-acting granulocyte colony-stimulating factor that stimulates the growth of neutrophils, a type of white blood cell important in the body's fight against infection. It is used to reduce the incidence of fever and infection in patients with certain types of cancer who are receiving chemotherapy that affects the bone marrow, and to increase survival after being exposed to high doses of radiation. This medicine may be used for other purposes; ask your health care provider or pharmacist if you have questions. COMMON BRAND NAME(S): Neulasta What should I tell my health care provider before I take this medicine? They need to know if you have any of these conditions: -kidney disease -latex allergy -ongoing radiation therapy -sickle cell disease -skin reactions to acrylic adhesives (On-Body Injector only) -an unusual or allergic reaction to pegfilgrastim, filgrastim, other medicines, foods, dyes, or preservatives -pregnant or trying to get pregnant -breast-feeding How should I use this medicine? This medicine is for injection under the skin. If you get this medicine at home, you will be taught how to prepare and give the pre-filled syringe or how to use the On-body Injector. Refer to the patient Instructions for Use for detailed instructions. Use exactly as directed. Take your medicine at regular intervals. Do not take your medicine more often than directed. It is important that you put your used needles and syringes in a special sharps container. Do not put them in a trash can. If you do not have a sharps container, call your pharmacist or healthcare provider to get one. Talk to your pediatrician regarding the use of this medicine in children. While this drug may be prescribed for selected conditions, precautions do apply. Overdosage: If you think you have taken too much of this medicine contact a poison control  center or emergency room at once. NOTE: This medicine is only for you. Do not share this medicine with others. What if I miss a dose? It is important not to miss your dose. Call your doctor or health care professional if you miss your dose. If you miss a dose due to an On-body Injector failure or leakage, a new dose should be administered as soon as possible using a single prefilled syringe for manual use. What may interact with this medicine? Interactions have not been studied. Give your health care provider a list of all the medicines, herbs, non-prescription drugs, or dietary supplements you use. Also tell them if you smoke, drink alcohol, or use illegal drugs. Some items may interact with your medicine. This list may not describe all possible interactions. Give your health care provider a list of all the medicines, herbs, non-prescription drugs, or dietary supplements you use. Also tell them if you smoke, drink alcohol, or use illegal drugs. Some items may interact with your medicine. What should I watch for while using this medicine? You may need blood work done while you are taking this medicine. If you are going to need a MRI, CT scan, or other procedure, tell your doctor that you are using this medicine (On-Body Injector only). What side effects may I notice from receiving this medicine? Side effects that you should report to your doctor or health care professional as soon as possible: -allergic reactions like skin rash, itching or hives, swelling of the face, lips, or tongue -dizziness -fever -pain, redness, or irritation at site where injected -pinpoint red spots on the skin -red or dark-brown urine -shortness of breath or breathing problems -stomach or   side pain, or pain at the shoulder -swelling -tiredness -trouble passing urine or change in the amount of urine Side effects that usually do not require medical attention (report to your doctor or health care professional if they  continue or are bothersome): -bone pain -muscle pain This list may not describe all possible side effects. Call your doctor for medical advice about side effects. You may report side effects to FDA at 1-800-FDA-1088. Where should I keep my medicine? Keep out of the reach of children. Store pre-filled syringes in a refrigerator between 2 and 8 degrees C (36 and 46 degrees F). Do not freeze. Keep in carton to protect from light. Throw away this medicine if it is left out of the refrigerator for more than 48 hours. Throw away any unused medicine after the expiration date. NOTE: This sheet is a summary. It may not cover all possible information. If you have questions about this medicine, talk to your doctor, pharmacist, or health care provider.  2017 [employer]vier/Gold Standard (2014-06-23 14:30:14) [facility] Discharge Instructions for Patients Receiving Chemotherapy  Today you received the following chemotherapy agent: Taxol.   To help prevent nausea and vomiting after your treatment, we encourage you to take your nausea medication as directed  If you develop nausea and vomiting that is not controlled by your nausea medication, call the clinic.   BELOW ARE SYMPTOMS THAT SHOULD BE REPORTED IMMEDIATELY:  *FEVER GREATER THAN 100.5 F  *CHILLS WITH OR WITHOUT FEVER  NAUSEA AND VOMITING THAT IS NOT CONTROLLED WITH YOUR NAUSEA MEDICATION  *UNUSUAL SHORTNESS OF BREATH  *UNUSUAL BRUISING OR BLEEDING  TENDERNESS IN MOUTH AND THROAT WITH OR WITHOUT PRESENCE OF ULCERS  *URINARY PROBLEMS  *BOWEL PROBLEMS  UNUSUAL RASH Items with * indicate a potential emergency and should be followed up as soon as possible.  Feel free to call the clinic you have any questions or concerns. The clinic phone number is [phone].

## 2016-06-25 ENCOUNTER — Ambulatory Visit: Payer: Commercial Managed Care - HMO | Admitting: Hematology

## 2016-06-25 ENCOUNTER — Other Ambulatory Visit: Payer: Commercial Managed Care - HMO

## 2016-07-05 ENCOUNTER — Ambulatory Visit: Payer: Medicare HMO | Admitting: Hematology

## 2016-07-05 ENCOUNTER — Telehealth: Payer: Self-pay | Admitting: *Deleted

## 2016-07-05 ENCOUNTER — Other Ambulatory Visit: Payer: Self-pay

## 2016-07-05 ENCOUNTER — Telehealth: Payer: Self-pay | Admitting: Hematology

## 2016-07-05 ENCOUNTER — Ambulatory Visit: Payer: Medicare HMO

## 2016-07-05 NOTE — Telephone Encounter (Signed)
Called pt regarding missed appt today & she states she received a call about an appt on Monday & thought that her appt had been moved to Monday due to the weather.  She is unable to come today.  Informed will have schedulers r/s missed appts. Message to schedulers

## 2016-07-05 NOTE — Progress Notes (Deleted)
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 [facility])   11/30/2015 Initial Diagnosis    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 [facility])      11/30/2015 Initial Biopsy    Right axilla lymph node biopsy showed metastatic poorly differentiated adenocarcinoma, IHC positive for CK AE1/AE3, CK7, E-cadherin, CDX-2, and focally very weak to equivocal staining for ER      12/21/2015 Imaging    Bilateral breast MRI showed extensive non-mass enhancement involving the entire right breast, right axillary, supraclavicular, and internal mammary adenopathy, new left axillary adenopathy      12/27/2015 Mammogram    Diagnostic mammogram and ultrasound of right breast and axilla showed diffuse skin thickening and increased density, no focal mass, numerous enlarged right axillary lymph node, the largest measuring 5.3cm.       12/27/2015 Initial Biopsy    Right breast core needle biopsy showed invasive ductal carcinoma, and DCIS, lymphovascular invasion identified      12/27/2015 Receptors her2    ER negative, PR negative, Ki-67 70%, HER-2 negative      01/03/2016 Imaging    PET scan showed diffuse asymmetric right breast hypermetabolic soft tissue density and skin thickening, hypermetabolic metastatic lymphadenopathy in bilateral axillary and subpectoral regions, right supraclavicular region and the right cervical level 5      01/10/2016 - 05/28/2016 Chemotherapy    Weekly Taxol 80 mg/m,  second cycle was postponed to 8/22 due to pt's non-compliance, stopped due to disease progression. She developed mild peripheral neuropathy in early Dec 2017.      02/07/2016 Pathology Results    Left axillary lymph node biopsy showed metastatic poorly differentiated carcinoma, triple negative.      04/24/2016 Imaging    CT CHEST, ABDOMEN AND PELVIS WITH CONTRAST IMPRESSION: 1. Bulky right axillary and mild right retropectoral and right supraclavicular lymphadenopathy is mildly decreased since 01/03/2016 PET-CT. 2. Left axillary lymphadenopathy is mildly increased. 3. Diffuse asymmetric thickening of the skin and fibroglandular soft tissues of the right breast is not appreciably changed. 4. No definite additional sites of metastatic disease in the chest, abdomen or pelvis. 5. Subcentimeter right liver lobe lesion is too small to characterize and is probably stable. 6. Additional findings include aortic atherosclerosis, 1 vessel coronary atherosclerosis, tiny hiatal hernia and myomatous uterus.      06/05/2016 Imaging    PET scan showed progressive right breast mass, extending in size and metabolic activity. Enlarging bilateral axillary adenopathy likewise in increased activity. A right supraclavicular lymph node is slightly smaller than before, no other new metastasis.       HISTORY OF PRESENTING ILLNESS (12/14/2015) :  [name] 71 y.o. 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 She is accompanied by her husband to the clinic today.  She noticed a lump at right axilla in 08/2015, no pain or other complains, she also report right breast swelling, no  pain, skin erythema, nipple discharge or other complain. She otherwise feels well. She was initially seen at urgent care in March, then was referred to establish her care with prior care physician at [facility] internal medicine center. Her screening mammogram was negative in January 2017. She  subsequently underwent CT chest on 09/27/2015 which showed enlarged and inflamed right breast, bulky right axillary lymph nodes. She was referred to breast Center for diagnostic mammogram and ultrasound of right breast, which was negative. She underwent ultrasound guided right axillary node biopsy on 12/05/2015, which reviewed port differentiated carcinoma. ER weakly focally positive.  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 She works independently. She lives with her husband. She denies any fever, night sweats, chills, or skin itchiness.  CURRENT THERAPY: chemotherapy with weekly Taxol, started on 01/10/2016, second cycle was delayed due to pt's non-compliance   INTERIM HISTORY:  [name] for follow up and discuss PET scan findings. She is accompanied by her daughter and husband to clinic today. She reports her right breast has been more swelling, firm and painful. No other new complains. She has not taken any pain meds latley.   MEDICAL HISTORY:  Past Medical History:  Diagnosis Date  . Arthritis   . GERD (gastroesophageal reflux disease)   . Hypertension   . Shortness of breath   . Sleep apnea   . Stroke ([facility]     SURGICAL HISTORY: Past Surgical History:  Procedure Laterality Date  . IR GENERIC HISTORICAL  06/13/2016   IR [place] VASC ACCESS LEFT 06/13/2016 JCorrie Mckusick DO [facility]INTER[facility]  . IR GENERIC HISTORICAL  06/13/2016   IR FLUORO GUIDE PORT INSERTION LEFT 06/13/2016 JCorrie Mckusick DO [facility]  . NO PAST SURGERIES      SOCIAL HISTORY: Social History   Social History  . Marital status: Married    Spouse name: N/A  . Number of children: 4  . Years of  education: 8 TH   Occupational History  . Not on file.   Social History Main Topics  . Smoking status: Former Smoker    Packs/day: 0.50    Years: 1.00    Types: Cigarettes    Start date: 06/17/1968    Quit date: 06/17/1973  . Smokeless tobacco: Never Used     Comment: Quit in 159s . Alcohol use No     Comment: former drinker for approximately 10 years  . Drug use: No  . Sexual activity: Not on file   Other Topics Concern  . Not on file   Social History Narrative   Patient is married with 3 children still living and 1 deceased son.   Patient is right handed.   Patient has 8 th grade education.   Patient drinks 2-3 servings daily.    FAMILY HISTORY: Family History  Problem Relation Age of Onset  . Heart disease Mother   . Cancer Father 716   throat cancer  . Cancer Cousin     breast cancer    ALLERGIES:  has No Known Allergies.  MEDICATIONS:  Current Outpatient Prescriptions  Medication Sig Dispense Refill  . amLODipine (NORVASC)  10 MG tablet Take 1 tablet (10 mg total) by mouth daily. 90 tablet 3  . aspirin EC 81 MG tablet Take 1 tablet (81 mg total) by mouth daily. 90 tablet 3  . atorvastatin (LIPITOR) 20 MG tablet TAKE 1 TABLET EVERY DAY (Patient taking differently: Take 20 mg by mouth daily) 90 tablet 3  . furosemide (LASIX) 20 MG tablet Take 20 mg by mouth daily.     Marland Kitchen lidocaine-prilocaine (EMLA) cream Apply to port @ 1 hour prior to treatment. 30 g 1  . metoprolol tartrate (LOPRESSOR) 25 MG tablet Take 1 tablet (25 mg total) by mouth daily. 90 tablet 0  . ondansetron (ZOFRAN) 8 MG tablet Take by mouth every 8 (eight) hours as needed for nausea or vomiting.    . prochlorperazine (COMPAZINE) 10 MG tablet Take 10 mg by mouth every 6 (six) hours as needed for nausea or vomiting.    . traMADol (ULTRAM) 50 MG tablet Take 1 tablet (50 mg total) by mouth every 8 (eight) hours as needed. (Patient not taking: Reported on 06/13/2016) 60 tablet 1   No current  facility-administered medications for this visit.        REVIEW OF SYSTEMS: Constitutional: Denies fevers, chills or abnormal night sweats Eyes: Denies blurriness of vision, double vision or watery eyes Ears, nose, mouth, throat, and face: Denies mucositis or sore throat Respiratory: Denies cough, dyspnea or wheezes Cardiovascular: Denies palpitation, chest discomfort or lower extremity swelling Gastrointestinal:  Denies nausea, heartburn or change in bowel habits Skin: (+) Rash on her right hand. (+) Dry skin on bilateral feet. Lymphatics: Denies new lymphadenopathy or easy bruising Neurological: (+) Tingling toes. Denies numbness. Behavioral/Psych: Mood is stable, no new changes  All other systems were reviewed with the patient and are negative.  PHYSICAL EXAMINATION: ECOG PERFORMANCE STATUS: 1  There were no vitals filed for this visit. There were no vitals filed for this visit.  GENERAL:alert, no distress and comfortable SKIN: skin color, texture, turgor are normal, no rashes or significant lesions EYES: normal, conjunctiva are pink and non-injected, sclera clear OROPHARYNX:no exudate, no erythema and lips, buccal mucosa, and tongue normal  NECK: supple, thyroid normal size, non-tender, without nodularity LYMPH:  no palpable lymphadenopathy in the cervical, axillary or inguinal LUNGS: clear to auscultation and percussion with normal breathing effort HEART: regular rate & rhythm and no murmurs and no lower extremity edema ABDOMEN:abdomen soft, non-tender and normal bowel sounds Musculoskeletal:no cyanosis of digits and no clubbing  PSYCH: alert & oriented x 3 with fluent speech NEURO: Decreased vibration sensation on bilateral ankle and wrist. SKIN: Dry skin of the bilateral feet. Rash on the right palm, skin peeling, no discharge. EXTREMITIES: Bilateral hand and lower extremity edema. Breasts: Palpable mass measuring 10 x 10 cm in the center of the right breast. With  associated skin erythema and edema. There is a 3.0x3.5cm (initially 6X6.5cm) mass at the anterior right axilla, close to right chest wall of breast margin, no tenderness, left axilla was negative.   LABORATORY DATA:  I have reviewed the data as listed CBC Latest Ref Rng & Units 06/21/2016 06/13/2016 06/11/2016  WBC 3.9 - 10.3 10e3/uL 4.8 4.3 4.7  Hemoglobin 11.6 - 15.9 g/dL 10.5(L) 11.3(L) 11.5(L)  Hematocrit 34.8 - 46.6 % 33.0(L) 35.2(L) 35.9  Platelets 145 - 400 10e3/uL 245 247 257   CMP Latest Ref Rng & Units 06/21/2016 06/13/2016 06/11/2016  Glucose 70 - 140 mg/dl 88 89 93  BUN 7.0 - 26.0 mg/dL 26.4(H) 23(H)  21.2  Creatinine 0.6 - 1.1 mg/dL 1.0 1.03(H) 1.1  Sodium 136 - 145 mEq/L 141 141 143  Potassium 3.5 - 5.1 mEq/L 4.3 4.2 3.9  Chloride 101 - 111 mmol/L - 106 -  CO2 22 - 29 mEq/L _0 Calcium 8.4 - 10.4 mg/dL 9.1 9.1 9.4  Total Protein 6.4 - 8.3 g/dL 7.1 - 7.7  Total Bilirubin 0.20 - 1.20 mg/dL 0.40 - 0.47  Alkaline Phos 40 - 150 U/L 56 - 58  AST 5 - 34 U/L 11 - 13  ALT 0 - 55 U/L 10 - 15    CA27.29: 01/05/2016: 155.9 02/27/2016: 96.7 03/26/2016: 64.7 04/23/2016: 65.4 05/21/2016: 99.2   PATHOLOGY REPORT: Diagnosis 11/30/2015 Lymph node, needle/core biopsy, right breast/axilla - LYMPH NODE POSITIVE FOR METASTATIC POORLY DIFFERENTIATED CARCINOMA. - SEE COMMENT. Microscopic Comment Immunohistochemical stains are performed. The tumor is positive for cytokeratin AE1/AE3, cytokeratin 7 and E-cadherin. CDX-2 demonstrates weak positivity. Estrogen receptor demonstrates focal very weak to equivocal staining. Gross cystic disease fluid protein, cytokeratin 20, TTF-1, Melan-A and S100 are all negative. The findings are consistent with a poorly differentiated carcinoma. Given the staining pattern, an upper gastrointestinal and pancreatobiliary primary source should be ruled out. In addition, given the axillary location of the biopsy and focal weak to equivocal estrogen receptor  staining, ruling out a poorly differentiated carcinoma of breast primary source is also prudent. Lastly, a gynecologic primary may be considered. Correlation with clinical and radiologic impression is essential. Dr. Vicente Males has seen this case in consultation with essential agreement of the above diagnosis and comment. The findings are called to the [facility] on 12/04/15. (RAH:gt, 12/04/15)  Diagnosis 12/27/2015 Breast, right, needle core biopsy, central - INVASIVE MAMMARY CARCINOMA. - MAMMARY CARCINOMA IN SITU. - LYMPH/VASCULAR INVASION IS IDENTIFIED. - SEE COMMENT. Microscopic Comment An E-cadherin stain will be performed to determine if the carcinoma is ductal or lobular in nature. The results of the stain will be reported in an addendum to follow. Although definitive grading of breast carcinoma is best done on excision, the features of the invasive tumor from the right central breast biopsy are compatible with a grade 3 breast carcinoma. Breast prognostic markers will be performed and reported in an addendum. Findings are called to the [facility] on 12/28/2015. Dr. Lyndon Code has seen this case in consultation with agreement. (RH:kh 12-28-15) ADDENDUM: An E-cadherin immunohistochemical stain is performed which is positive in both the invasive and in situ components confirming the ductal nature of both (i.e. invasive ductal carcinoma with ductal carcinoma in situ). (RAH:gt, 12/29/15) PROGNOSTIC INDICATORS Results: IMMUNOHISTOCHEMICAL AND MORPHOMETRIC ANALYSIS PERFORMED MANUALLY Estrogen Receptor: 0%, NEGATIVE Progesterone Receptor: 0%, NEGATIVE Proliferation Marker Ki67: 70% Results: HER2 - NEGATIVE RATIO OF HER2/CEP17 SIGNALS 1.13 AVERAGE HER2 COPY NUMBER PER CELL 1.75  Diagnosis 02/07/2016 Lymph node, needle/core biopsy, left axillary - METASTATIC POORLY DIFFERENTIATED CARCINOMA. Results: IMMUNOHISTOCHEMICAL AND MORPHOMETRIC ANALYSIS PERFORMED  MANUALLY Estrogen Receptor: 0%, NEGATIVE Progesterone Receptor: 0%, NEGATIVE Results: HER2 - NEGATIVE RATIO OF HER2/CEP17 SIGNALS 1.21 AVERAGE HER2 COPY NUMBER PER CELL 2.05  RADIOGRAPHIC STUDIES: I have personally reviewed the radiological images as listed and agreed with the findings in the report. Nm Pet Image Restag (ps) Skull Base To Thigh  Result Date: 06/05/2016 CLINICAL DATA:  Subsequent treatment strategy for right breast cancer. EXAM: NUCLEAR MEDICINE PET SKULL BASE TO THIGH TECHNIQUE: 9.2 mCi F-18 FDG was injected intravenously. Full-ring PET imaging was performed from the skull base to thigh after the  radiotracer. CT data was obtained and used for attenuation correction and anatomic localization. FASTING BLOOD GLUCOSE:  Value: 95 mg/dl COMPARISON:  01/03/2016 FINDINGS: NECK Right supraclavicular node 1.1 cm in short axis on image 40/4 (previously 1.5 cm) with maximum standard uptake value ule 11.8 (formerly 8.5). CHEST Hypermetabolic proximally 7.3 by 5.4 cm right breast mass appears more densely hypermetabolic compared the prior exam with a maximum SUV today of 33.2, previously 10.9. Increased nodularity and medial extension of the mass, increased extension down to the superficial margin of the pectoralis major muscle, increased subareolar extension, increased overlying skin thickening. Bilateral subpectoral and axillary adenopathy is worsened. The dominant right axillary node measures 3.8 cm in short axis (formerly 3.4 cm) and has a maximum standard uptake value of 29.6 (formerly 26.1). An index left axillary lymph node measures 1.5 cm in short axis (formerly 0.9 cm) and has a maximum standard uptake value of 28.3 (formerly 17.2). Coronary, aortic arch, and branch vessel atherosclerotic vascular disease. There is extensive edema along the anterior chest including both breasts, greater on the right than the left. ABDOMEN/PELVIS No abnormal hypermetabolic activity within the liver, pancreas,  adrenal glands, or spleen. No hypermetabolic lymph nodes in the abdomen or pelvis. Aortoiliac atherosclerotic vascular disease. SKELETON No focal hypermetabolic activity to suggest skeletal metastasis. IMPRESSION: 1. Progressive right breast mass, expanding in size and metabolic activity. Enlarging but bilateral axillary adenopathy likewise increased activity. A right supraclavicular lymph node is slightly smaller than previous but has a higher standard uptake value. Overall appearance compatible with progression but no further new metastatic spread compared to 01/03/2016. 2. Coronary, aortic arch, and branch vessel atherosclerotic vascular disease. Aortoiliac atherosclerotic vascular disease. 3. Extensive edema along the anterior chest wall especially both breasts, right greater than left. Electronically Signed   By: Van Clines M.D.   On: 06/05/2016 11:23   Ir US Guide Vasc Access Left  Result Date: 06/13/2016 INDICATION: 71 year old female with history of right-sided breast carcinoma. She has been referred for port catheter placement. EXAM: IMPLANTED PORT A CATH PLACEMENT WITH ULTRASOUND AND FLUOROSCOPIC GUIDANCE MEDICATIONS: 2.0 g Ancef; The antibiotic was administered within an appropriate time interval prior to skin puncture. ANESTHESIA/SEDATION: Moderate (conscious) sedation was employed during this procedure. A total of Versed 1.5 mg and Fentanyl 75 mcg was administered intravenously. Moderate Sedation Time: 25 minutes. The patient's level of consciousness and vital signs were monitored continuously by radiology nursing throughout the procedure under my direct supervision. FLUOROSCOPY TIME:  Zero minutes, 30 seconds (2.6 mGy) COMPLICATIONS: None PROCEDURE: The procedure, risks, benefits, and alternatives were explained to the patient. Questions regarding the procedure were encouraged and answered. The patient understands and consents to the procedure. Ultrasound survey was performed with images  stored and sent to PACs. The left neck and chest was prepped with chlorhexidine, and draped in the usual sterile fashion using maximum barrier technique (cap and mask, sterile gown, sterile gloves, large sterile sheet, hand hygiene and cutaneous antiseptic). Antibiotic prophylaxis was provided with 2.0g Ancef administered IV one hour prior to skin incision. Local anesthesia was attained by infiltration with 1% lidocaine without epinephrine. Ultrasound demonstrated patency of the left internal jugular vein, and this was documented with an image. Under real-time ultrasound guidance, this vein was accessed with a 21 gauge micropuncture needle and image documentation was performed. A small dermatotomy was made at the access site with an 11 scalpel. A 0.018" wire was advanced into the SVC and used to estimate the length of the internal catheter.  The access needle exchanged for a 16F micropuncture vascular sheath. The 0.018" wire was then removed and a 0.035" wire advanced into the IVC. An appropriate location for the subcutaneous reservoir was selected below the clavicle and an incision was made through the skin and underlying soft tissues. The subcutaneous tissues were then dissected using a combination of blunt and sharp surgical technique and a pocket was formed. A single lumen power injectable portacatheter was then tunneled through the subcutaneous tissues from the pocket to the dermatotomy and the port reservoir placed within the subcutaneous pocket. The venous access site was then serially dilated and a peel away vascular sheath placed over the wire. The wire was removed and the port catheter advanced into position under fluoroscopic guidance. The catheter tip is positioned in the cavoatrial junction. This was documented with a spot image. The portacatheter was then tested and found to flush and aspirate well. The port was flushed with saline followed by 100 units/mL heparinized saline. The pocket was then closed  in two layers using first subdermal inverted interrupted absorbable sutures followed by a running subcuticular suture. The epidermis was then sealed with Dermabond. The dermatotomy at the venous access site was also seal with Dermabond. Patient tolerated the procedure well and remained hemodynamically stable throughout. No complications encountered and no significant blood loss encountered IMPRESSION: Status post left-sided IJ port catheter placement. Catheter ready for use. Signed, Dulcy Fanny. Earleen Newport, DO [facility] Electronically Signed   By: Corrie Mckusick D.O.   On: 06/13/2016 13:00   Ir Fluoro Guide Port Insertion Left  Result Date: 06/13/2016 INDICATION: 71 year old female with history of right-sided breast carcinoma. She has been referred for port catheter placement. EXAM: IMPLANTED PORT A CATH PLACEMENT WITH ULTRASOUND AND FLUOROSCOPIC GUIDANCE MEDICATIONS: 2.0 g Ancef; The antibiotic was administered within an appropriate time interval prior to skin puncture. ANESTHESIA/SEDATION: Moderate (conscious) sedation was employed during this procedure. A total of Versed 1.5 mg and Fentanyl 75 mcg was administered intravenously. Moderate Sedation Time: 25 minutes. The patient's level of consciousness and vital signs were monitored continuously by radiology nursing throughout the procedure under my direct supervision. FLUOROSCOPY TIME:  Zero minutes, 30 seconds (2.6 mGy) COMPLICATIONS: None PROCEDURE: The procedure, risks, benefits, and alternatives were explained to the patient. Questions regarding the procedure were encouraged and answered. The patient understands and consents to the procedure. Ultrasound survey was performed with images stored and sent to PACs. The left neck and chest was prepped with chlorhexidine, and draped in the usual sterile fashion using maximum barrier technique (cap and mask, sterile gown, sterile gloves, large sterile sheet,  hand hygiene and cutaneous antiseptic). Antibiotic prophylaxis was provided with 2.0g Ancef administered IV one hour prior to skin incision. Local anesthesia was attained by infiltration with 1% lidocaine without epinephrine. Ultrasound demonstrated patency of the left internal jugular vein, and this was documented with an image. Under real-time ultrasound guidance, this vein was accessed with a 21 gauge micropuncture needle and image documentation was performed. A small dermatotomy was made at the access site with an 11 scalpel. A 0.018" wire was advanced into the SVC and used to estimate the length of the internal catheter. The access needle exchanged for a 16F micropuncture vascular sheath. The 0.018" wire was then removed and a 0.035" wire advanced into the IVC. An appropriate location for the subcutaneous reservoir was selected below the clavicle and an incision was made through the skin and underlying soft tissues. The subcutaneous tissues  were then dissected using a combination of blunt and sharp surgical technique and a pocket was formed. A single lumen power injectable portacatheter was then tunneled through the subcutaneous tissues from the pocket to the dermatotomy and the port reservoir placed within the subcutaneous pocket. The venous access site was then serially dilated and a peel away vascular sheath placed over the wire. The wire was removed and the port catheter advanced into position under fluoroscopic guidance. The catheter tip is positioned in the cavoatrial junction. This was documented with a spot image. The portacatheter was then tested and found to flush and aspirate well. The port was flushed with saline followed by 100 units/mL heparinized saline. The pocket was then closed in two layers using first subdermal inverted interrupted absorbable sutures followed by a running subcuticular suture. The epidermis was then sealed with Dermabond. The dermatotomy at the venous access site was also seal  with Dermabond. Patient tolerated the procedure well and remained hemodynamically stable throughout. No complications encountered and no significant blood loss encountered IMPRESSION: Status post left-sided IJ port catheter placement. Catheter ready for use. Signed, Dulcy Fanny. Earleen Newport, DO [facility]iology Electronically Signed   By: Corrie Mckusick D.O.   On: 06/13/2016 13:00   Echo 06/14/2016 Study Conclusions  - Left ventricle: The cavity size was normal. There was moderate   concentric hypertrophy. Systolic function was normal. The   estimated ejection fraction was in the range of 60% to 65%. Wall   motion was normal; there were no regional wall motion   abnormalities. Doppler parameters are consistent with abnormal   left ventricular relaxation (grade 1 diastolic dysfunction).   ASSESSMENT & PLAN:  71 y.o. female, with past medical history of HTN, GERD, sleep apnea, presented with bulky right axillary adenopathy.  1. 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 Unfortunately her cancer has metastasized to left axilla and cervical lymph nodes, which are distant metastasis.  -We previously reviewed the natural history of metastatic triple negative breast cancer, which is very aggressive, and her cancer is incurable at this stage  -Giving the metastatic disease, surgery up front is not indicated, I recommended systemic chemotherapy. -She has been on weekly Taxol for 4-5 month -I previously  reviewed her restaging CT scan findings from 04/24/16, which showed slight improvement in the right-sided adenopathy, no other new metastasis. -I reviewed her recent restaging PET scan, which showed significant cancer progression in the right breast, no other sternal metastasis. - I recommend swiitch her chemotherapy to Adriamycin and Cytoxan, for rapid disease control, to prevent bleeding and infection from the primary breast cancer. -alternative options, such as either single agent chemotherapy, palliative radiation, clinical trial options (unfortunately the [facility] has no open slot now) were also discussed with pt. She agree with [facility]  --Chemotherapy consent: Side effects including but does not not limited to, fatigue, nausea, vomiting, diarrhea, hair loss, neuropathy, fluid retention, renal and kidney dysfunction, neutropenic fever, needed for blood transfusion, bleeding, congestive heart failure, secondary to leukemia and MDS, were discussed with patient in great detail. She  agrees to proceed. -Echocardiogram before fourth cycle chemotherapy -Port placement. I had lengthy discussion about the port with patient, she finally agreed. -will start first cycle AC in a week   2. ANEMIA in neoplastic disease -She developed normocytic mild anemia in the past few months. -Her anemia workup shows a normal folic acid and M21 level, increased ferritin, decreased serum iron, TIBC and transferrin saturation. This is most consistent with anemia of chronic disease, secondary to her CKD and underlying malignancy.  -I previously encouraged her to continue oral iron supplement  3. CKD stage III -Her GFR is in 40-50's  -No lab signs of tumor lysis -Possibly related to her underlying hypertension and diuretics  -Her Cr has much improved since she stopped her Lasix. -Avoid nephrotoxins, such as NSAIDs and IV CT contrast  4. HTN, GERD, obesity, sleep apnea -She'll continue follow-up with her primary care  physician  5. Right breast pain -secondary to breast cancer -I refilled her tramadol   PLAN -PET scan reviewed  -ECHO asap -port placement by IR asap -first cycle AC in a week  -I plan to see her back before second cycle chemo  I spent 30 minutes counseling the patient face to face. The total time spent in the appointment was 35 minute s and more than 50% was on counseling.     Truitt Merle, MD 07/05/2016   This document serves as a record of services personally performed by Truitt Merle, MD. It was created on her behalf by Darcus Austin, a trained medical scribe. The creation of this record is based on the scribe's personal observations and the provider's statements to them. This document has been checked and approved by the attending provider.

## 2016-07-05 NOTE — Telephone Encounter (Signed)
sw pt to confirm 1/29 appt date/time per LOS °

## 2016-07-08 ENCOUNTER — Other Ambulatory Visit: Payer: Self-pay | Admitting: Internal Medicine

## 2016-07-08 ENCOUNTER — Ambulatory Visit: Payer: Medicare HMO

## 2016-07-10 ENCOUNTER — Ambulatory Visit: Payer: Medicare HMO

## 2016-07-14 NOTE — Progress Notes (Signed)
[facility]  [phone] [phone]  Clinic Follow Up Note   Patient Care Team: [name], MD as PCP - General (Internal Medicine) 07/15/2016   CHIEF COMPLAINTS:  Follow up right breast cancer   Oncology History   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 [facility])   Staging form: Breast, AJCC 7th Edition   - Clinical stage from 11/30/2015: Stage IV (T4d, N3c, M1) - Signed by Truitt Merle, MD on 01/05/2016      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 [facility])   11/30/2015 Initial Diagnosis    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 [facility])      11/30/2015 Initial Biopsy    Right axilla lymph node biopsy showed metastatic poorly differentiated adenocarcinoma, IHC positive for CK AE1/AE3, CK7, E-cadherin, CDX-2, and focally very weak to equivocal staining for ER      12/21/2015 Imaging    Bilateral breast MRI showed extensive non-mass enhancement involving the entire right breast, right axillary, supraclavicular, and internal mammary adenopathy, new left axillary adenopathy      12/27/2015 Mammogram    Diagnostic mammogram and ultrasound of right breast and axilla showed diffuse skin thickening and increased density, no focal mass, numerous enlarged right axillary lymph node, the largest measuring 5.3cm.       12/27/2015 Initial Biopsy    Right breast core needle biopsy showed invasive ductal carcinoma, and DCIS, lymphovascular invasion identified      12/27/2015 Receptors her2    ER negative, PR negative, Ki-67 70%, HER-2 negative      01/03/2016 Imaging    PET scan showed diffuse asymmetric right breast hypermetabolic soft tissue density and skin thickening, hypermetabolic metastatic lymphadenopathy in bilateral axillary and subpectoral regions, right supraclavicular region and the right cervical level 5      01/10/2016 - 05/28/2016 Chemotherapy    Weekly Taxol 80 mg/m, second cycle was  postponed to 8/22 due to pt's non-compliance, stopped due to disease progression. She developed mild peripheral neuropathy in early Dec 2017.      02/07/2016 Pathology Results    Left axillary lymph node biopsy showed metastatic poorly differentiated carcinoma, triple negative.      04/24/2016 Imaging    CT CHEST, ABDOMEN AND PELVIS WITH CONTRAST IMPRESSION: 1. Bulky right axillary and mild right retropectoral and right supraclavicular lymphadenopathy is mildly decreased since 01/03/2016 PET-CT. 2. Left axillary lymphadenopathy is mildly increased. 3. Diffuse asymmetric thickening of the skin and fibroglandular soft tissues of the right breast is not appreciably changed. 4. No definite additional sites of metastatic disease in the chest, abdomen or pelvis. 5. Subcentimeter right liver lobe lesion is too small to characterize and is probably stable. 6. Additional findings include aortic atherosclerosis, 1 vessel coronary atherosclerosis, tiny hiatal hernia and myomatous uterus.      06/05/2016 Imaging    PET scan showed progressive right breast mass, extending in size and metabolic activity. Enlarging bilateral axillary adenopathy likewise in increased activity. A right supraclavicular lymph node is slightly smaller than before, no other new metastasis.       HISTORY OF PRESENTING ILLNESS (12/14/2015) :  [name] 71 y.o. 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 She is accompanied by her husband to the clinic today.  She noticed a lump at right axilla in 08/2015, no pain or other complains, she also report right breast swelling, no pain, skin erythema, nipple  discharge or other complain. She otherwise feels well. She was initially seen at urgent care in March, then was referred to establish her care with prior care physician at [facility] internal medicine center. Her screening mammogram was negative in January 2017. She subsequently underwent  CT chest on 09/27/2015 which showed enlarged and inflamed right breast, bulky right axillary lymph nodes. She was referred to breast Center for diagnostic mammogram and ultrasound of right breast, which was negative. She underwent ultrasound guided right axillary node biopsy on 12/05/2015, which reviewed port differentiated carcinoma. ER weakly focally positive.  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 She works independently. She lives with her husband. She denies any fever, night sweats, chills, or skin itchiness.  CURRENT THERAPY: second line chemotherapy with AC, every 2 weeks, started on 06/21/2016  INTERIM HISTORY:  LFrankireturns for follow up and chemo. She does not coming for second cycle chemotherapy 2 weeks ago due to inclement weather, and she did not reschedule. She tolerated the first cycle AC moderately well, did have significant anorexia and fatigue, recovered well. She reports her right breast has been swollen, hard as a rock, and is slightly tender.No other new complains.   MEDICAL HISTORY:  Past Medical History:  Diagnosis Date  . Arthritis   . GERD (gastroesophageal reflux disease)   . Hypertension   . Shortness of breath   . Sleep apnea   . Stroke ([facility]     SURGICAL HISTORY: Past Surgical History:  Procedure Laterality Date  . IR GENERIC HISTORICAL  06/13/2016   IR [place] VASC ACCESS LEFT 06/13/2016 JCorrie Mckusick DO [facility]-INTERV [facility]  . IR GENERIC HISTORICAL  06/13/2016   IR FLUORO GUIDE PORT INSERTION LEFT 06/13/2016 JCorrie Mckusick DO [facility]INTER[facility]  . NO PAST SURGERIES      SOCIAL HISTORY: Social History   Social History  . Marital status: Married     Spouse name: N/A  . Number of children: 4  . Years of education: 8 TH   Occupational History  . Not on file.   Social History Main Topics  . Smoking status: Former Smoker    Packs/day: 0.50    Years: 1.00    Types: Cigarettes    Start date: 06/17/1968    Quit date: 06/17/1973  . Smokeless tobacco: Never Used     Comment: Quit in 160s . Alcohol use No     Comment: former drinker for approximately 10 years  . Drug use: No  . Sexual activity: Not on file   Other Topics Concern  . Not on file   Social History Narrative   Patient is married with 3 children still living and 1 deceased son.   Patient is right handed.   Patient has 8 th grade education.   Patient drinks 2-3 servings daily.    FAMILY HISTORY: Family History  Problem Relation Age of Onset  . Heart disease Mother   . Cancer Father 714   throat cancer  . Cancer Cousin     breast cancer    ALLERGIES:  has No Known Allergies.  MEDICATIONS:  Current Outpatient Prescriptions  Medication Sig Dispense Refill  . amLODipine (NORVASC) 10 MG tablet Take 1 tablet (10 mg total) by mouth daily. 90 tablet 3  . aspirin EC 81 MG tablet Take 1 tablet (81 mg total) by mouth daily. 90 tablet 3  . atorvastatin (LIPITOR) 20 MG tablet TAKE 1 TABLET EVERY DAY (Patient taking differently: Take 20 mg by mouth daily) 90 tablet 3  . furosemide (LASIX) 20 MG tablet Take 20 mg by mouth daily.     Marland Kitchen lidocaine-prilocaine (EMLA) cream Apply to port @ 1 hour prior to treatment. 30 g 1  . metoprolol tartrate (LOPRESSOR) 25 MG tablet TAKE 1 TABLET EVERY DAY 90 tablet 3  . ondansetron (ZOFRAN) 8 MG tablet Take by mouth every 8 (eight) hours as needed for nausea or vomiting.    . prochlorperazine (COMPAZINE) 10 MG tablet Take 10 mg by mouth every 6 (six) hours as needed for nausea or vomiting.    . traMADol (ULTRAM) 50 MG tablet Take 1 tablet (50 mg total) by mouth every 8 (eight) hours as needed. 60 tablet 1   No current facility-administered  medications for this visit.    Facility-Administered Medications Ordered in Other Visits  Medication Dose Route Frequency Provider Last Rate Last Dose  . sodium chloride flush (NS) 0.9 % injection 10 mL  10 mL Intravenous PRN Truitt Merle, MD   10 mL at 07/15/16 1145       REVIEW OF SYSTEMS: Constitutional: Denies fevers, chills or abnormal night sweats Eyes: Denies blurriness of vision, double vision or watery eyes Ears, nose, mouth, throat, and face: Denies mucositis or sore throat Respiratory: Denies cough, dyspnea or wheezes Cardiovascular: Denies palpitation, chest discomfort or lower extremity swelling Gastrointestinal:  Denies nausea, heartburn or change in bowel habits Skin: no skin rashes  Lymphatics: Denies new lymphadenopathy or easy bruising Neurological: (+) Tingling toes. Denies numbness. Behavioral/Psych: Mood is stable, no new changes  All other systems were reviewed with the patient and are negative.  PHYSICAL EXAMINATION: ECOG PERFORMANCE STATUS: 1  Vitals:   07/15/16 1205  BP: 140/83  Pulse: 69  Resp: 18  Temp: 98.3 F (36.8 C)   Filed Weights   07/15/16 1205  Weight: 185 lb 9.6 oz (84.2 kg)    GENERAL:alert, no distress and comfortable SKIN: skin color, texture, turgor are normal, no rashes or significant lesions EYES: normal, conjunctiva are pink and non-injected, sclera clear OROPHARYNX:no exudate, no erythema and lips, buccal mucosa, and tongue normal  NECK: supple, thyroid normal size, non-tender, without nodularity LYMPH:  no palpable lymphadenopathy in the cervical, axillary or inguinal LUNGS: clear to auscultation and percussion with normal breathing effort HEART: regular rate & rhythm and no murmurs and no lower extremity edema ABDOMEN:abdomen soft, non-tender and normal bowel sounds Musculoskeletal:no cyanosis of digits and no clubbing  PSYCH: alert & oriented x 3 with fluent speech NEURO: Decreased vibration sensation on bilateral ankle and  wrist. SKIN: Dry skin of the bilateral feet. Rash on the right palm, skin peeling, no discharge. EXTREMITIES: Bilateral hand and lower extremity edema. Breasts: her entire right breast is very firm, with associated skin erythema and edema. There is a 5cm mass at the anterior right axilla, close to right chest wall of breast margin, no tenderness, left axilla was negative.   LABORATORY DATA:  I have reviewed the data as listed CBC Latest Ref Rng & Units 07/15/2016 06/21/2016 06/13/2016  WBC 3.9 - 10.3 10e3/uL 5.6 4.8 4.3  Hemoglobin 11.6 - 15.9 g/dL 10.2(L) 10.5(L)  11.3(L)  Hematocrit 34.8 - 46.6 % 31.2(L) 33.0(L) 35.2(L)  Platelets 145 - 400 10e3/uL 270 245 247   CMP Latest Ref Rng & Units 07/15/2016 06/21/2016 06/13/2016  Glucose 70 - 140 mg/dl 97 88 89  BUN 7.0 - 26.0 mg/dL 23.9 26.4(H) 23(H)  Creatinine 0.6 - 1.1 mg/dL 0.9 1.0 1.03(H)  Sodium 136 - 145 mEq/L 143 141 141  Potassium 3.5 - 5.1 mEq/L 4.2 4.3 4.2  Chloride 101 - 111 mmol/L - - 106  CO2 22 - 29 mEq/L _0 Calcium 8.4 - 10.4 mg/dL 9.4 9.1 9.1  Total Protein 6.4 - 8.3 g/dL 7.2 7.1 -  Total Bilirubin 0.20 - 1.20 mg/dL 0.35 0.40 -  Alkaline Phos 40 - 150 U/L 53 56 -  AST 5 - 34 U/L 14 11 -  ALT 0 - 55 U/L 12 10 -    CA27.29: 01/05/2016: 155.9 02/27/2016: 96.7 03/26/2016: 64.7 04/23/2016: 65.4 05/21/2016: 99.2 07/15/2016: 215.4    PATHOLOGY REPORT: Diagnosis 11/30/2015 Lymph node, needle/core biopsy, right breast/axilla - LYMPH NODE POSITIVE FOR METASTATIC POORLY DIFFERENTIATED CARCINOMA. - SEE COMMENT. Microscopic Comment Immunohistochemical stains are performed. The tumor is positive for cytokeratin AE1/AE3, cytokeratin 7 and E-cadherin. CDX-2 demonstrates weak positivity. Estrogen receptor demonstrates focal very weak to equivocal staining. Gross cystic disease fluid protein, cytokeratin 20, TTF-1, Melan-A and S100 are all negative. The findings are consistent with a poorly differentiated carcinoma. Given the  staining pattern, an upper gastrointestinal and pancreatobiliary primary source should be ruled out. In addition, given the axillary location of the biopsy and focal weak to equivocal estrogen receptor staining, ruling out a poorly differentiated carcinoma of breast primary source is also prudent. Lastly, a gynecologic primary may be considered. Correlation with clinical and radiologic impression is essential. Dr. Vicente Males has seen this case in consultation with essential agreement of the above diagnosis and comment. The findings are called to the [facility] on 12/04/15. (RAH:gt, 12/04/15)  Diagnosis 12/27/2015 Breast, right, needle core biopsy, central - INVASIVE MAMMARY CARCINOMA. - MAMMARY CARCINOMA IN SITU. - LYMPH/VASCULAR INVASION IS IDENTIFIED. - SEE COMMENT. Microscopic Comment An E-cadherin stain will be performed to determine if the carcinoma is ductal or lobular in nature. The results of the stain will be reported in an addendum to follow. Although definitive grading of breast carcinoma is best done on excision, the features of the invasive tumor from the right central breast biopsy are compatible with a grade 3 breast carcinoma. Breast prognostic markers will be performed and reported in an addendum. Findings are called to the [facility] on 12/28/2015. Dr. Lyndon Code has seen this case in consultation with agreement. (RH:kh 12-28-15) ADDENDUM: An E-cadherin immunohistochemical stain is performed which is positive in both the invasive and in situ components confirming the ductal nature of both (i.e. invasive ductal carcinoma with ductal carcinoma in situ). (RAH:gt, 12/29/15) PROGNOSTIC INDICATORS Results: IMMUNOHISTOCHEMICAL AND MORPHOMETRIC ANALYSIS PERFORMED MANUALLY Estrogen Receptor: 0%, NEGATIVE Progesterone Receptor: 0%, NEGATIVE Proliferation Marker Ki67: 70% Results: HER2 - NEGATIVE RATIO OF HER2/CEP17 SIGNALS 1.13 AVERAGE HER2 COPY NUMBER  PER CELL 1.75  Diagnosis 02/07/2016 Lymph node, needle/core biopsy, left axillary - METASTATIC POORLY DIFFERENTIATED CARCINOMA. Results: IMMUNOHISTOCHEMICAL AND MORPHOMETRIC ANALYSIS PERFORMED MANUALLY Estrogen Receptor: 0%, NEGATIVE Progesterone Receptor: 0%, NEGATIVE Results: HER2 - NEGATIVE RATIO OF HER2/CEP17 SIGNALS 1.21 AVERAGE HER2 COPY NUMBER PER CELL 2.05  RADIOGRAPHIC STUDIES: I have personally reviewed the radiological images as listed and agreed with the findings in the report. No results  found.  ASSESSMENT & PLAN:  71 y.o. female, with past medical history of HTN, GERD, sleep apnea, presented with bulky right axillary adenopathy.  1. 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 Unfortunately her cancer has metastasized to left axilla and cervical lymph nodes, which are distant metastasis.  -We previously reviewed the natural history of metastatic triple negative breast cancer, which is very aggressive, and her cancer is incurable at this stage  -Giving the metastatic disease, surgery up front is not indicated, I recommended systemic chemotherapy. -She has been on weekly Taxol for 4-5 month -I previously reviewed her restaging CT scan findings from 04/24/16, which showed slight improvement in the right-sided adenopathy, no other new metastasis. -I reviewed her recent restaging PET scan, which showed significant cancer progression in the right breast, no other sternal metastasis. - I recommend swiitch her chemotherapy to  Adriamycin and Cytoxan, for rapid disease control, to prevent bleeding and infection from the primary breast cancer. 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 -It's hard to say if she responded to the first cycle AC due to the delay of her second cycle chemo, will proceed one more cycle chemo today, if no clinically improvement, I will discuss her case in the breast tumor board, to see if she should undergo palliative mastectomy or radiation. -She has not been very compliant with treatment, her chemotherapy had a multiple delay in the past. I strongly encourage her to follow the treatment schedule to achieve the best benefit from treatment, she agrees.    2. ANEMIA in neoplastic disease -She developed normocytic mild anemia in the past few months. -Her anemia workup shows a normal folic acid and M63 level, increased ferritin, decreased serum iron, TIBC and transferrin saturation. This is most consistent with anemia of chronic disease, secondary to her CKD and underlying malignancy.  -I previously encouraged her to continue oral iron supplement  3. CKD stage III -Her GFR is in 40-50's  -No lab signs of tumor lysis -Possibly related to her underlying hypertension and diuretics  -Her Cr has much improved since she stopped her Lasix. -Avoid nephrotoxins, such as NSAIDs and IV CT contrast  4. HTN, GERD, obesity, sleep apnea -She'll continue follow-up with her primary care physician  5. Right breast pain -secondary to breast cancer -continue tramadol as needed   PLAN -Lab reviewed, adequate for treatment, we'll proceed to second cycle chemotherapy AC today -return in 2 weeks   I spent 30 minutes counseling the patient face to face. The total time spent in the  appointment was 35 minute s and more than 50% was on counseling.     Truitt Merle, MD 07/15/2016

## 2016-07-15 ENCOUNTER — Ambulatory Visit: Payer: Medicare HMO

## 2016-07-15 ENCOUNTER — Ambulatory Visit (HOSPITAL_BASED_OUTPATIENT_CLINIC_OR_DEPARTMENT_OTHER): Payer: Medicare HMO | Admitting: Hematology

## 2016-07-15 ENCOUNTER — Other Ambulatory Visit (HOSPITAL_BASED_OUTPATIENT_CLINIC_OR_DEPARTMENT_OTHER): Payer: Medicare HMO

## 2016-07-15 ENCOUNTER — Ambulatory Visit (HOSPITAL_BASED_OUTPATIENT_CLINIC_OR_DEPARTMENT_OTHER): Payer: Medicare HMO

## 2016-07-15 ENCOUNTER — Telehealth: Payer: Self-pay | Admitting: Hematology

## 2016-07-15 ENCOUNTER — Encounter: Payer: Self-pay | Admitting: Hematology

## 2016-07-15 VITALS — BP 140/83 | HR 69 | Temp 98.3°F | Resp 18 | Ht 64.0 in | Wt 185.6 lb

## 2016-07-15 DIAGNOSIS — C50911 Malignant neoplasm of unspecified site of right female breast: Secondary | ICD-10-CM

## 2016-07-15 DIAGNOSIS — Z171 Estrogen receptor negative status [ER-]: Secondary | ICD-10-CM

## 2016-07-15 DIAGNOSIS — G4733 Obstructive sleep apnea (adult) (pediatric): Secondary | ICD-10-CM

## 2016-07-15 DIAGNOSIS — N183 Chronic kidney disease, stage 3 unspecified: Secondary | ICD-10-CM

## 2016-07-15 DIAGNOSIS — C773 Secondary and unspecified malignant neoplasm of axilla and upper limb lymph nodes: Secondary | ICD-10-CM

## 2016-07-15 DIAGNOSIS — G893 Neoplasm related pain (acute) (chronic): Secondary | ICD-10-CM

## 2016-07-15 DIAGNOSIS — I10 Essential (primary) hypertension: Secondary | ICD-10-CM

## 2016-07-15 DIAGNOSIS — Z5111 Encounter for antineoplastic chemotherapy: Secondary | ICD-10-CM

## 2016-07-15 DIAGNOSIS — D630 Anemia in neoplastic disease: Secondary | ICD-10-CM

## 2016-07-15 DIAGNOSIS — Z95828 Presence of other vascular implants and grafts: Secondary | ICD-10-CM

## 2016-07-15 DIAGNOSIS — E669 Obesity, unspecified: Secondary | ICD-10-CM

## 2016-07-15 DIAGNOSIS — C778 Secondary and unspecified malignant neoplasm of lymph nodes of multiple regions: Secondary | ICD-10-CM | POA: Diagnosis not present

## 2016-07-15 DIAGNOSIS — C799 Secondary malignant neoplasm of unspecified site: Secondary | ICD-10-CM | POA: Diagnosis not present

## 2016-07-15 LAB — CBC WITH DIFFERENTIAL/PLATELET
BASO%: 3.8 % — ABNORMAL HIGH (ref 0.0–2.0)
BASOS ABS: 0.2 10*3/uL — AB (ref 0.0–0.1)
EOS ABS: 0 10*3/uL (ref 0.0–0.5)
EOS%: 0.3 % (ref 0.0–7.0)
HEMATOCRIT: 31.2 % — AB (ref 34.8–46.6)
HEMOGLOBIN: 10.2 g/dL — AB (ref 11.6–15.9)
LYMPH#: 0.5 10*3/uL — AB (ref 0.9–3.3)
LYMPH%: 9 % — ABNORMAL LOW (ref 14.0–49.7)
MCH: 28.4 pg (ref 25.1–34.0)
MCHC: 32.5 g/dL (ref 31.5–36.0)
MCV: 87.3 fL (ref 79.5–101.0)
MONO#: 1 10*3/uL — AB (ref 0.1–0.9)
MONO%: 17.2 % — ABNORMAL HIGH (ref 0.0–14.0)
NEUT%: 69.7 % (ref 38.4–76.8)
NEUTROS ABS: 3.9 10*3/uL (ref 1.5–6.5)
Platelets: 270 10*3/uL (ref 145–400)
RBC: 3.58 10*6/uL — ABNORMAL LOW (ref 3.70–5.45)
RDW: 17.3 % — AB (ref 11.2–14.5)
WBC: 5.6 10*3/uL (ref 3.9–10.3)

## 2016-07-15 LAB — COMPREHENSIVE METABOLIC PANEL
ALBUMIN: 3.8 g/dL (ref 3.5–5.0)
ALK PHOS: 53 U/L (ref 40–150)
ALT: 12 U/L (ref 0–55)
AST: 14 U/L (ref 5–34)
Anion Gap: 9 mEq/L (ref 3–11)
BILIRUBIN TOTAL: 0.35 mg/dL (ref 0.20–1.20)
BUN: 23.9 mg/dL (ref 7.0–26.0)
CALCIUM: 9.4 mg/dL (ref 8.4–10.4)
CO2: 26 mEq/L (ref 22–29)
Chloride: 108 mEq/L (ref 98–109)
Creatinine: 0.9 mg/dL (ref 0.6–1.1)
EGFR: 73 mL/min/{1.73_m2} — AB (ref >90)
GLUCOSE: 97 mg/dL (ref 70–140)
Potassium: 4.2 mEq/L (ref 3.5–5.1)
SODIUM: 143 meq/L (ref 136–145)
TOTAL PROTEIN: 7.2 g/dL (ref 6.4–8.3)

## 2016-07-15 MED ORDER — PALONOSETRON HCL INJECTION 0.25 MG/5ML
INTRAVENOUS | Status: AC
  Filled 2016-07-15: qty 5

## 2016-07-15 MED ORDER — SODIUM CHLORIDE 0.9 % IV SOLN
600.0000 mg/m2 | Freq: Once | INTRAVENOUS | Status: AC
  Administered 2016-07-15: 1180 mg via INTRAVENOUS
  Filled 2016-07-15: qty 59

## 2016-07-15 MED ORDER — SODIUM CHLORIDE 0.9 % IV SOLN
Freq: Once | INTRAVENOUS | Status: AC
  Administered 2016-07-15: 13:00:00 via INTRAVENOUS

## 2016-07-15 MED ORDER — HEPARIN SOD (PORK) LOCK FLUSH 100 UNIT/ML IV SOLN
500.0000 [IU] | Freq: Once | INTRAVENOUS | Status: AC | PRN
  Administered 2016-07-15: 500 [IU]
  Filled 2016-07-15: qty 5

## 2016-07-15 MED ORDER — DOXORUBICIN HCL CHEMO IV INJECTION 2 MG/ML
60.0000 mg/m2 | Freq: Once | INTRAVENOUS | Status: AC
  Administered 2016-07-15: 118 mg via INTRAVENOUS
  Filled 2016-07-15: qty 59

## 2016-07-15 MED ORDER — PALONOSETRON HCL INJECTION 0.25 MG/5ML
0.2500 mg | Freq: Once | INTRAVENOUS | Status: AC
  Administered 2016-07-15: 0.25 mg via INTRAVENOUS

## 2016-07-15 MED ORDER — SODIUM CHLORIDE 0.9 % IV SOLN
Freq: Once | INTRAVENOUS | Status: AC
  Administered 2016-07-15: 13:00:00 via INTRAVENOUS
  Filled 2016-07-15: qty 5

## 2016-07-15 MED ORDER — SODIUM CHLORIDE 0.9% FLUSH
10.0000 mL | INTRAVENOUS | Status: DC | PRN
  Administered 2016-07-15: 10 mL via INTRAVENOUS
  Filled 2016-07-15: qty 10

## 2016-07-15 MED ORDER — SODIUM CHLORIDE 0.9% FLUSH
10.0000 mL | INTRAVENOUS | Status: DC | PRN
  Administered 2016-07-15: 10 mL
  Filled 2016-07-15: qty 10

## 2016-07-15 NOTE — Patient Instructions (Signed)
[facility] Discharge Instructions for Patients Receiving Chemotherapy  Today you received the following chemotherapy agents:Adriamycin and Cytoxan   To help prevent nausea and vomiting after your treatment, we encourage you to take your nausea medication as directed.    If you develop nausea and vomiting that is not controlled by your nausea medication, call the clinic.   BELOW ARE SYMPTOMS THAT SHOULD BE REPORTED IMMEDIATELY:  *FEVER GREATER THAN 100.5 F  *CHILLS WITH OR WITHOUT FEVER  NAUSEA AND VOMITING THAT IS NOT CONTROLLED WITH YOUR NAUSEA MEDICATION  *UNUSUAL SHORTNESS OF BREATH  *UNUSUAL BRUISING OR BLEEDING  TENDERNESS IN MOUTH AND THROAT WITH OR WITHOUT PRESENCE OF ULCERS  *URINARY PROBLEMS  *BOWEL PROBLEMS  UNUSUAL RASH Items with * indicate a potential emergency and should be followed up as soon as possible.  Feel free to call the clinic you have any questions or concerns. The clinic phone number is [phone].  Please show the CHEMO ALERT CARD at check-in to the Emergency Department and triage nurse.   

## 2016-07-15 NOTE — Telephone Encounter (Signed)
Message sent to chemo scheduler to be added per 07/15/16 los. Appointments scheduled per 07/15/16 los. Patient was given a copy of the appointment schedule and AVS report per 07/15/16 los.

## 2016-07-16 ENCOUNTER — Telehealth: Payer: Self-pay | Admitting: *Deleted

## 2016-07-16 LAB — CANCER ANTIGEN 27.29: CA 27.29: 215.4 U/mL — ABNORMAL HIGH (ref 0.0–38.6)

## 2016-07-16 NOTE — Telephone Encounter (Signed)
Per 1/29 LOS and staff message I have scheduled appt and notified the scheduler

## 2016-07-17 ENCOUNTER — Ambulatory Visit (HOSPITAL_BASED_OUTPATIENT_CLINIC_OR_DEPARTMENT_OTHER): Payer: Medicare HMO

## 2016-07-17 ENCOUNTER — Encounter: Payer: Self-pay | Admitting: Hematology

## 2016-07-17 VITALS — BP 135/90 | HR 77 | Temp 97.1°F | Resp 20

## 2016-07-17 DIAGNOSIS — C773 Secondary and unspecified malignant neoplasm of axilla and upper limb lymph nodes: Secondary | ICD-10-CM

## 2016-07-17 DIAGNOSIS — C50911 Malignant neoplasm of unspecified site of right female breast: Secondary | ICD-10-CM

## 2016-07-17 DIAGNOSIS — Z5189 Encounter for other specified aftercare: Secondary | ICD-10-CM | POA: Diagnosis not present

## 2016-07-17 MED ORDER — PEGFILGRASTIM INJECTION 6 MG/0.6ML ~~LOC~~
6.0000 mg | PREFILLED_SYRINGE | Freq: Once | SUBCUTANEOUS | Status: AC
  Administered 2016-07-17: 6 mg via SUBCUTANEOUS
  Filled 2016-07-17: qty 0.6

## 2016-07-17 NOTE — Patient Instructions (Signed)
Pegfilgrastim injection What is this medicine? PEGFILGRASTIM (PEG fil gra stim) is a long-acting granulocyte colony-stimulating factor that stimulates the growth of neutrophils, a type of white blood cell important in the body's fight against infection. It is used to reduce the incidence of fever and infection in patients with certain types of cancer who are receiving chemotherapy that affects the bone marrow, and to increase survival after being exposed to high doses of radiation. This medicine may be used for other purposes; ask your health care provider or pharmacist if you have questions. COMMON BRAND NAME(S): Neulasta What should I tell my health care provider before I take this medicine? They need to know if you have any of these conditions: -kidney disease -latex allergy -ongoing radiation therapy -sickle cell disease -skin reactions to acrylic adhesives (On-Body Injector only) -an unusual or allergic reaction to pegfilgrastim, filgrastim, other medicines, foods, dyes, or preservatives -pregnant or trying to get pregnant -breast-feeding How should I use this medicine? This medicine is for injection under the skin. If you get this medicine at home, you will be taught how to prepare and give the pre-filled syringe or how to use the On-body Injector. Refer to the patient Instructions for Use for detailed instructions. Use exactly as directed. Take your medicine at regular intervals. Do not take your medicine more often than directed. It is important that you put your used needles and syringes in a special sharps container. Do not put them in a trash can. If you do not have a sharps container, call your pharmacist or healthcare provider to get one. Talk to your pediatrician regarding the use of this medicine in children. While this drug may be prescribed for selected conditions, precautions do apply. Overdosage: If you think you have taken too much of this medicine contact a poison control  center or emergency room at once. NOTE: This medicine is only for you. Do not share this medicine with others. What if I miss a dose? It is important not to miss your dose. Call your doctor or health care professional if you miss your dose. If you miss a dose due to an On-body Injector failure or leakage, a new dose should be administered as soon as possible using a single prefilled syringe for manual use. What may interact with this medicine? Interactions have not been studied. Give your health care provider a list of all the medicines, herbs, non-prescription drugs, or dietary supplements you use. Also tell them if you smoke, drink alcohol, or use illegal drugs. Some items may interact with your medicine. This list may not describe all possible interactions. Give your health care provider a list of all the medicines, herbs, non-prescription drugs, or dietary supplements you use. Also tell them if you smoke, drink alcohol, or use illegal drugs. Some items may interact with your medicine. What should I watch for while using this medicine? You may need blood work done while you are taking this medicine. If you are going to need a MRI, CT scan, or other procedure, tell your doctor that you are using this medicine (On-Body Injector only). What side effects may I notice from receiving this medicine? Side effects that you should report to your doctor or health care professional as soon as possible: -allergic reactions like skin rash, itching or hives, swelling of the face, lips, or tongue -dizziness -fever -pain, redness, or irritation at site where injected -pinpoint red spots on the skin -red or dark-brown urine -shortness of breath or breathing problems -stomach or   side pain, or pain at the shoulder -swelling -tiredness -trouble passing urine or change in the amount of urine Side effects that usually do not require medical attention (report to your doctor or health care professional if they  continue or are bothersome): -bone pain -muscle pain This list may not describe all possible side effects. Call your doctor for medical advice about side effects. You may report side effects to FDA at 1-800-FDA-1088. Where should I keep my medicine? Keep out of the reach of children. Store pre-filled syringes in a refrigerator between 2 and 8 degrees C (36 and 46 degrees F). Do not freeze. Keep in carton to protect from light. Throw away this medicine if it is left out of the refrigerator for more than 48 hours. Throw away any unused medicine after the expiration date. NOTE: This sheet is a summary. It may not cover all possible information. If you have questions about this medicine, talk to your doctor, pharmacist, or health care provider.  2017 Elsevier/Gold Standard (2014-06-23 14:30:14) [facility] Discharge Instructions for Patients Receiving Chemotherapy  Today you received the following chemotherapy agent: Taxol.   To help prevent nausea and vomiting after your treatment, we encourage you to take your nausea medication as directed  If you develop nausea and vomiting that is not controlled by your nausea medication, call the clinic.   BELOW ARE SYMPTOMS THAT SHOULD BE REPORTED IMMEDIATELY:  *FEVER GREATER THAN 100.5 F  *CHILLS WITH OR WITHOUT FEVER  NAUSEA AND VOMITING THAT IS NOT CONTROLLED WITH YOUR NAUSEA MEDICATION  *UNUSUAL SHORTNESS OF BREATH  *UNUSUAL BRUISING OR BLEEDING  TENDERNESS IN MOUTH AND THROAT WITH OR WITHOUT PRESENCE OF ULCERS  *URINARY PROBLEMS  *BOWEL PROBLEMS  UNUSUAL RASH Items with * indicate a potential emergency and should be followed up as soon as possible.  Feel free to call the clinic you have any questions or concerns. The clinic phone number is [phone].

## 2016-07-24 NOTE — Progress Notes (Signed)
[place]  [phone] [phone]  Clinic Follow Up Note   Patient Care Team: [name], MD as PCP - General (Internal Medicine) 07/27/2016   CHIEF COMPLAINTS:  Follow up right breast cancer   Oncology History   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 [facility])   Staging form: Breast, AJCC 7th Edition   - Clinical stage from 11/30/2015: Stage IV (T4d, N3c, M1) - Signed by Truitt Merle, MD on 01/05/2016      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 [facility])   11/30/2015 Initial Diagnosis    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 [facility])      11/30/2015 Initial Biopsy    Right axilla lymph node biopsy showed metastatic poorly differentiated adenocarcinoma, IHC positive for CK AE1/AE3, CK7, E-cadherin, CDX-2, and focally very weak to equivocal staining for ER      12/21/2015 Imaging    Bilateral breast MRI showed extensive non-mass enhancement involving the entire right breast, right axillary, supraclavicular, and internal mammary adenopathy, new left axillary adenopathy      12/27/2015 Mammogram    Diagnostic mammogram and ultrasound of right breast and axilla showed diffuse skin thickening and increased density, no focal mass, numerous enlarged right axillary lymph node, the largest measuring 5.3cm.       12/27/2015 Initial Biopsy    Right breast core needle biopsy showed invasive ductal carcinoma, and DCIS, lymphovascular invasion identified      12/27/2015 Receptors her2    ER negative, PR negative, Ki-67 70%, HER-2 negative      01/03/2016 Imaging    PET scan showed diffuse asymmetric right breast hypermetabolic soft tissue density and skin thickening, hypermetabolic metastatic lymphadenopathy in bilateral axillary and subpectoral regions, right supraclavicular region and the right cervical level 5      01/10/2016 - 05/28/2016 Chemotherapy    Weekly Taxol 80 mg/m, second cycle was  postponed to 8/22 due to pt's non-compliance, stopped due to disease progression. She developed mild peripheral neuropathy in early Dec 2017.      02/07/2016 Pathology Results    Left axillary lymph node biopsy showed metastatic poorly differentiated carcinoma, triple negative.      04/24/2016 Imaging    CT CHEST, ABDOMEN AND PELVIS WITH CONTRAST IMPRESSION: 1. Bulky right axillary and mild right retropectoral and right supraclavicular lymphadenopathy is mildly decreased since 01/03/2016 PET-CT. 2. Left axillary lymphadenopathy is mildly increased. 3. Diffuse asymmetric thickening of the skin and fibroglandular soft tissues of the right breast is not appreciably changed. 4. No definite additional sites of metastatic disease in the chest, abdomen or pelvis. 5. Subcentimeter right liver lobe lesion is too small to characterize and is probably stable. 6. Additional findings include aortic atherosclerosis, 1 vessel coronary atherosclerosis, tiny hiatal hernia and myomatous uterus.      06/05/2016 Imaging    PET scan showed progressive right breast mass, extending in size and metabolic activity. Enlarging bilateral axillary adenopathy likewise in increased activity. A right supraclavicular lymph node is slightly smaller than before, no other new metastasis.       HISTORY OF PRESENTING ILLNESS (12/14/2015) :  [name] 71 y.o. 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 She is accompanied by her husband to the clinic today.  She noticed a lump at right axilla in 08/2015, no pain or other complains, she also report right breast swelling, no pain, skin erythema, nipple  discharge or other complain. She otherwise feels well. She was initially seen at urgent care in March, then was referred to establish her care with prior care physician at [facility] internal medicine center. Her screening mammogram was negative in January 2017. She subsequently underwent  CT chest on 09/27/2015 which showed enlarged and inflamed right breast, bulky right axillary lymph nodes. She was referred to breast Center for diagnostic mammogram and ultrasound of right breast, which was negative. She underwent ultrasound guided right axillary node biopsy on 12/05/2015, which reviewed port differentiated carcinoma. ER weakly focally positive.  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 She works independently. She lives with her husband. She denies any fever, night sweats, chills, or skin itchiness.  CURRENT THERAPY: second line chemotherapy with AC, every 2 weeks, started on 06/21/2016  INTERIM HISTORY:  [name] returns for follow up and chemo. States she's able to eat normally this week, eats light meals. Lost about 5 lbs in the past week. Reports getting chills after she eats. Denies checking her temperature. Reports her right breast pain has improved and the site is softer. Reports her heels feeling hot and the skin of her feet is peeling. Reports neuropathy is much improved.  MEDICAL HISTORY:  Past Medical History:  Diagnosis Date  . Arthritis   . GERD (gastroesophageal reflux disease)   . Hypertension   . Shortness of breath   . Sleep apnea   . Stroke [facility])     SURGICAL HISTORY: Past Surgical History:  Procedure Laterality Date  . IR GENERIC HISTORICAL  06/13/2016   IR US GUIDE VASC ACCESS LEFT 06/13/2016 Corrie Mckusick, DO [facility]-INTER[facility]  . IR GENERIC HISTORICAL  06/13/2016   IR FLUORO GUIDE PORT INSERTION LEFT 06/13/2016 Corrie Mckusick, DO [facility]  . NO PAST SURGERIES      SOCIAL HISTORY: Social History   Social History  . Marital status: Married     Spouse name: N/A  . Number of children: 4  . Years of education: 8 TH   Occupational History  . Not on file.   Social History Main Topics  . Smoking status: Former Smoker    Packs/day: 0.50    Years: 1.00    Types: Cigarettes    Start date: 06/17/1968    Quit date: 06/17/1973  . Smokeless tobacco: Never Used     Comment: Quit in 27s  . Alcohol use No     Comment: former drinker for approximately 10 years  . Drug use: No  . Sexual activity: Not on file   Other Topics Concern  . Not on file   Social History Narrative   Patient is married with 3 children still living and 1 deceased son.   Patient is right handed.   Patient has 8 th grade education.   Patient drinks 2-3 servings daily.    FAMILY HISTORY: Family History  Problem Relation Age of Onset  . Heart disease Mother   . Cancer Father 79    throat cancer  . Cancer Cousin     breast cancer    ALLERGIES:  has No Known Allergies.  MEDICATIONS:  Current  Outpatient Prescriptions  Medication Sig Dispense Refill  . amLODipine (NORVASC) 10 MG tablet Take 1 tablet (10 mg total) by mouth daily. 90 tablet 3  . aspirin EC 81 MG tablet Take 1 tablet (81 mg total) by mouth daily. 90 tablet 3  . atorvastatin (LIPITOR) 20 MG tablet TAKE 1 TABLET EVERY DAY (Patient taking differently: Take 20 mg by mouth daily) 90 tablet 3  . furosemide (LASIX) 20 MG tablet Take 20 mg by mouth daily.     Marland Kitchen lidocaine-prilocaine (EMLA) cream Apply to port @ 1 hour prior to treatment. 30 g 1  . metoprolol tartrate (LOPRESSOR) 25 MG tablet TAKE 1 TABLET EVERY DAY 90 tablet 3  . ondansetron (ZOFRAN) 8 MG tablet Take by mouth every 8 (eight) hours as needed for nausea or vomiting.    . prochlorperazine (COMPAZINE) 10 MG tablet Take 10 mg by mouth every 6 (six) hours as needed for nausea or vomiting.    . traMADol (ULTRAM) 50 MG tablet Take 1 tablet (50 mg total) by mouth every 8 (eight) hours as needed. 60 tablet 1   No current facility-administered  medications for this visit.        REVIEW OF SYSTEMS: Constitutional: Denies fevers, chills or abnormal night sweats Eyes: Denies blurriness of vision, double vision or watery eyes Ears, nose, mouth, throat, and face: Denies mucositis or sore throat Respiratory: Denies cough, dyspnea or wheezes Cardiovascular: Denies palpitation, chest discomfort or lower extremity swelling Gastrointestinal:  Denies nausea, heartburn or change in bowel habits Skin: no skin rashes. (+) Peeling skin of the sole of her feet. Lymphatics: Denies new lymphadenopathy or easy bruising Neurological: (+) Tingling toes. Denies numbness. Behavioral/Psych: Mood is stable, no new changes  All other systems were reviewed with the patient and are negative.  PHYSICAL EXAMINATION: ECOG PERFORMANCE STATUS: 1  Vitals:   07/26/16 1023  BP: (!) 148/67  Pulse: 80  Resp: 18  Temp: 98.1 F (36.7 C)   Filed Weights   07/26/16 1023  Weight: 180 lb 3.2 oz (81.7 kg)    GENERAL:alert, no distress and comfortable SKIN: skin color, texture, turgor are normal, no rashes or significant lesions EYES: normal, conjunctiva are pink and non-injected, sclera clear OROPHARYNX:no exudate, no erythema and lips, buccal mucosa, and tongue normal  NECK: supple, thyroid normal size, non-tender, without nodularity LYMPH:  no palpable lymphadenopathy in the cervical, axillary or inguinal LUNGS: clear to auscultation and percussion with normal breathing effort HEART: regular rate & rhythm and no murmurs and no lower extremity edema ABDOMEN:abdomen soft, non-tender and normal bowel sounds Musculoskeletal:no cyanosis of digits and no clubbing  PSYCH: alert & oriented x 3 with fluent speech NEURO: Decreased vibration sensation on bilateral ankle and wrist. SKIN: (+) Dry skin of the bilateral feet, no sign of infection. EXTREMITIES: Bilateral hand and lower extremity edema. Breasts: her entire right breast is very firm, with associated skin  erythema and edema which appears slightly improved. There is a 4 x 3.5 cm mass at the anterior right axilla, close to right chest wall of breast margin, no tenderness. Left axilla moveable lymph node measuring 2.5 cm.  LABORATORY DATA:  I have reviewed the data as listed CBC Latest Ref Rng & Units 07/15/2016 06/21/2016 06/13/2016  WBC 3.9 - 10.3 10e3/uL 5.6 4.8 4.3  Hemoglobin 11.6 - 15.9 g/dL 10.2(L) 10.5(L) 11.3(L)  Hematocrit 34.8 - 46.6 % 31.2(L) 33.0(L) 35.2(L)  Platelets 145 - 400 10e3/uL 270 245 247   CMP Latest Ref Rng &  Units 07/15/2016 06/21/2016 06/13/2016  Glucose 70 - 140 mg/dl 97 88 89  BUN 7.0 - 26.0 mg/dL 23.9 26.4(H) 23(H)  Creatinine 0.6 - 1.1 mg/dL 0.9 1.0 1.03(H)  Sodium 136 - 145 mEq/L 143 141 141  Potassium 3.5 - 5.1 mEq/L 4.2 4.3 4.2  Chloride 101 - 111 mmol/L - - 106  CO2 22 - 29 mEq/L _0 Calcium 8.4 - 10.4 mg/dL 9.4 9.1 9.1  Total Protein 6.4 - 8.3 g/dL 7.2 7.1 -  Total Bilirubin 0.20 - 1.20 mg/dL 0.35 0.40 -  Alkaline Phos 40 - 150 U/L 53 56 -  AST 5 - 34 U/L 14 11 -  ALT 0 - 55 U/L 12 10 -    CA27.29: 01/05/2016: 155.9 02/27/2016: 96.7 03/26/2016: 64.7 04/23/2016: 65.4 05/21/2016: 99.2 07/15/2016: 215.4    PATHOLOGY REPORT: Diagnosis 11/30/2015 Lymph node, needle/core biopsy, right breast/axilla - LYMPH NODE POSITIVE FOR METASTATIC POORLY DIFFERENTIATED CARCINOMA. - SEE COMMENT. Microscopic Comment Immunohistochemical stains are performed. The tumor is positive for cytokeratin AE1/AE3, cytokeratin 7 and E-cadherin. CDX-2 demonstrates weak positivity. Estrogen receptor demonstrates focal very weak to equivocal staining. Gross cystic disease fluid protein, cytokeratin 20, TTF-1, Melan-A and S100 are all negative. The findings are consistent with a poorly differentiated carcinoma. Given the staining pattern, an upper gastrointestinal and pancreatobiliary primary source should be ruled out. In addition, given the axillary location of the biopsy and focal  weak to equivocal estrogen receptor staining, ruling out a poorly differentiated carcinoma of breast primary source is also prudent. Lastly, a gynecologic primary may be considered. Correlation with clinical and radiologic impression is essential. Dr. Vicente Males has seen this case in consultation with essential agreement of the above diagnosis and comment. The findings are called to the [place] on 12/04/15. (RAH:gt, 12/04/15)  Diagnosis 12/27/2015 Breast, right, needle core biopsy, central - INVASIVE MAMMARY CARCINOMA. - MAMMARY CARCINOMA IN SITU. - LYMPH/VASCULAR INVASION IS IDENTIFIED. - SEE COMMENT. Microscopic Comment An E-cadherin stain will be performed to determine if the carcinoma is ductal or lobular in nature. The results of the stain will be reported in an addendum to follow. Although definitive grading of breast carcinoma is best done on excision, the features of the invasive tumor from the right central breast biopsy are compatible with a grade 3 breast carcinoma. Breast prognostic markers will be performed and reported in an addendum. Findings are called to the [place] on 12/28/2015. Dr. Lyndon Code has seen this case in consultation with agreement. (RH:kh 12-28-15) ADDENDUM: An E-cadherin immunohistochemical stain is performed which is positive in both the invasive and in situ components confirming the ductal nature of both (i.e. invasive ductal carcinoma with ductal carcinoma in situ). (RAH:gt, 12/29/15) PROGNOSTIC INDICATORS Results: IMMUNOHISTOCHEMICAL AND MORPHOMETRIC ANALYSIS PERFORMED MANUALLY Estrogen Receptor: 0%, NEGATIVE Progesterone Receptor: 0%, NEGATIVE Proliferation Marker Ki67: 70% Results: HER2 - NEGATIVE RATIO OF HER2/CEP17 SIGNALS 1.13 AVERAGE HER2 COPY NUMBER PER CELL 1.75  Diagnosis 02/07/2016 Lymph node, needle/core biopsy, left axillary - METASTATIC POORLY DIFFERENTIATED CARCINOMA. Results: IMMUNOHISTOCHEMICAL AND  MORPHOMETRIC ANALYSIS PERFORMED MANUALLY Estrogen Receptor: 0%, NEGATIVE Progesterone Receptor: 0%, NEGATIVE Results: HER2 - NEGATIVE RATIO OF HER2/CEP17 SIGNALS 1.21 AVERAGE HER2 COPY NUMBER PER CELL 2.05  RADIOGRAPHIC STUDIES: I have personally reviewed the radiological images as listed and agreed with the findings in the report. No results found.  ASSESSMENT & PLAN:  71 y.o. female, with past medical history of HTN, GERD, sleep apnea, presented with bulky right axillary adenopathy.  1.  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 Unfortunately her cancer has metastasized to left axilla and cervical lymph nodes, which are distant metastasis.  -We previously reviewed the natural history of metastatic triple negative breast cancer, which is very aggressive, and her cancer is incurable at this stage  -Giving the metastatic disease, surgery up front is not indicated, I recommended systemic chemotherapy. -She has been on weekly Taxol for 4-5 month -I previously reviewed her restaging CT scan findings from 04/24/16, which showed slight improvement in the right-sided adenopathy, no other new metastasis. -I reviewed her recent restaging PET scan, which showed significant cancer progression in the right breast, no other sternal metastasis. - I recommend swiitch her chemotherapy to Adriamycin and Cytoxan, for rapid disease control, to prevent bleeding and infection from the primary breast cancer. 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 -It's hard to say if she responded to the first cycle AC due to the delay of her second cycle chemo, she has received the second cycle chemo AC on 07/15/16, and has had some clinically improvement, will proceed with 3rd cycle of AC on 07/29/16. -She has not been very compliant with treatment, her chemotherapy had a multiple delays in the past. I strongly encourage her to follow the treatment schedule to achieve the best benefit from treatment, she agrees. 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 -Her anemia workup shows a normal folic acid and C62 level, increased ferritin, decreased serum iron, TIBC and transferrin saturation. This is most consistent with anemia of chronic disease, secondary to her CKD and underlying malignancy.  -I previously encouraged her to continue oral iron supplement  3. CKD stage III -Her GFR is in 40-50's  -No lab signs of tumor lysis -Possibly related to her underlying hypertension and diuretics  -Her Cr has much improved since she stopped her Lasix. -Avoid nephrotoxins, such as NSAIDs and IV CT contrast  4. HTN, GERD, obesity, sleep apnea -She'll continue follow-up with her primary care physician  5. Right breast pain -secondary to breast cancer, improved lately after chemo  -continue tramadol as needed   PLAN - we'll proceed to 3rd cycle chemotherapy AC on 07/29/16. -PET scan on 08/07/16. -F/u on 08/08/16. -Lab, flush, and chemo on 08/12/16   I spent 20 minutes counseling the patient face to face. The total time spent in the appointment was 25 minutes and more than 50% was on counseling.     Truitt Merle, MD 07/27/2016   This  document serves as a record of services personally performed by Truitt Merle, MD. It was created on her behalf by Darcus Austin, a trained medical scribe. The creation of this record is based on the scribe's personal observations and the provider's statements to them. This document  has been checked and approved by the attending provider.

## 2016-07-26 ENCOUNTER — Telehealth: Payer: Self-pay | Admitting: Hematology

## 2016-07-26 ENCOUNTER — Telehealth: Payer: Self-pay | Admitting: *Deleted

## 2016-07-26 ENCOUNTER — Ambulatory Visit (HOSPITAL_BASED_OUTPATIENT_CLINIC_OR_DEPARTMENT_OTHER): Payer: Medicare HMO | Admitting: Hematology

## 2016-07-26 VITALS — BP 148/67 | HR 80 | Temp 98.1°F | Resp 18 | Ht 64.0 in | Wt 180.2 lb

## 2016-07-26 DIAGNOSIS — G893 Neoplasm related pain (acute) (chronic): Secondary | ICD-10-CM

## 2016-07-26 DIAGNOSIS — N183 Chronic kidney disease, stage 3 unspecified: Secondary | ICD-10-CM

## 2016-07-26 DIAGNOSIS — C50911 Malignant neoplasm of unspecified site of right female breast: Secondary | ICD-10-CM | POA: Diagnosis not present

## 2016-07-26 DIAGNOSIS — C773 Secondary and unspecified malignant neoplasm of axilla and upper limb lymph nodes: Secondary | ICD-10-CM

## 2016-07-26 DIAGNOSIS — E669 Obesity, unspecified: Secondary | ICD-10-CM

## 2016-07-26 DIAGNOSIS — I10 Essential (primary) hypertension: Secondary | ICD-10-CM

## 2016-07-26 DIAGNOSIS — G4733 Obstructive sleep apnea (adult) (pediatric): Secondary | ICD-10-CM

## 2016-07-26 DIAGNOSIS — Z171 Estrogen receptor negative status [ER-]: Secondary | ICD-10-CM

## 2016-07-26 DIAGNOSIS — D630 Anemia in neoplastic disease: Secondary | ICD-10-CM

## 2016-07-26 NOTE — Telephone Encounter (Signed)
Per 2/9 LOS and staff message I have scheduled appt and notified the scheduler

## 2016-07-26 NOTE — Telephone Encounter (Signed)
Message sent to chemo scheduler to be added, per 07/26/16 los. Appointments scheduled per 07/26/16 los. Patient was given a copy of the AVS report and appointment schedule, per 07/26/16 los.

## 2016-07-27 ENCOUNTER — Encounter: Payer: Self-pay | Admitting: Hematology

## 2016-07-29 ENCOUNTER — Ambulatory Visit: Payer: Medicare HMO

## 2016-07-29 ENCOUNTER — Ambulatory Visit (HOSPITAL_BASED_OUTPATIENT_CLINIC_OR_DEPARTMENT_OTHER): Payer: Medicare HMO

## 2016-07-29 ENCOUNTER — Other Ambulatory Visit (HOSPITAL_BASED_OUTPATIENT_CLINIC_OR_DEPARTMENT_OTHER): Payer: Medicare HMO

## 2016-07-29 VITALS — BP 142/87 | HR 79 | Temp 98.0°F | Resp 16

## 2016-07-29 DIAGNOSIS — C50911 Malignant neoplasm of unspecified site of right female breast: Secondary | ICD-10-CM

## 2016-07-29 DIAGNOSIS — C773 Secondary and unspecified malignant neoplasm of axilla and upper limb lymph nodes: Secondary | ICD-10-CM

## 2016-07-29 DIAGNOSIS — Z95828 Presence of other vascular implants and grafts: Secondary | ICD-10-CM | POA: Insufficient documentation

## 2016-07-29 DIAGNOSIS — Z5111 Encounter for antineoplastic chemotherapy: Secondary | ICD-10-CM

## 2016-07-29 LAB — COMPREHENSIVE METABOLIC PANEL
ALBUMIN: 3.9 g/dL (ref 3.5–5.0)
ALK PHOS: 69 U/L (ref 40–150)
ALT: 8 U/L (ref 0–55)
AST: 13 U/L (ref 5–34)
Anion Gap: 8 mEq/L (ref 3–11)
BUN: 27.5 mg/dL — AB (ref 7.0–26.0)
CO2: 27 meq/L (ref 22–29)
Calcium: 9 mg/dL (ref 8.4–10.4)
Chloride: 109 mEq/L (ref 98–109)
Creatinine: 1.1 mg/dL (ref 0.6–1.1)
EGFR: 57 mL/min/{1.73_m2} — ABNORMAL LOW (ref >90)
GLUCOSE: 90 mg/dL (ref 70–140)
POTASSIUM: 4 meq/L (ref 3.5–5.1)
SODIUM: 143 meq/L (ref 136–145)
TOTAL PROTEIN: 7.1 g/dL (ref 6.4–8.3)
Total Bilirubin: 0.32 mg/dL (ref 0.20–1.20)

## 2016-07-29 LAB — CBC WITH DIFFERENTIAL/PLATELET
BASO%: 1.3 % (ref 0.0–2.0)
Basophils Absolute: 0.1 10*3/uL (ref 0.0–0.1)
EOS%: 1.5 % (ref 0.0–7.0)
Eosinophils Absolute: 0.1 10*3/uL (ref 0.0–0.5)
HCT: 29.7 % — ABNORMAL LOW (ref 34.8–46.6)
HEMOGLOBIN: 9.8 g/dL — AB (ref 11.6–15.9)
LYMPH%: 7 % — ABNORMAL LOW (ref 14.0–49.7)
MCH: 29.1 pg (ref 25.1–34.0)
MCHC: 33 g/dL (ref 31.5–36.0)
MCV: 88 fL (ref 79.5–101.0)
MONO#: 1 10*3/uL — ABNORMAL HIGH (ref 0.1–0.9)
MONO%: 12.5 % (ref 0.0–14.0)
NEUT%: 77.7 % — ABNORMAL HIGH (ref 38.4–76.8)
NEUTROS ABS: 6.4 10*3/uL (ref 1.5–6.5)
Platelets: 159 10*3/uL (ref 145–400)
RBC: 3.38 10*6/uL — ABNORMAL LOW (ref 3.70–5.45)
RDW: 18.4 % — AB (ref 11.2–14.5)
WBC: 8.2 10*3/uL (ref 3.9–10.3)
lymph#: 0.6 10*3/uL — ABNORMAL LOW (ref 0.9–3.3)

## 2016-07-29 MED ORDER — SODIUM CHLORIDE 0.9% FLUSH
10.0000 mL | INTRAVENOUS | Status: DC | PRN
  Administered 2016-07-29: 10 mL
  Filled 2016-07-29: qty 10

## 2016-07-29 MED ORDER — FOSAPREPITANT DIMEGLUMINE INJECTION 150 MG
Freq: Once | INTRAVENOUS | Status: AC
  Administered 2016-07-29: 10:00:00 via INTRAVENOUS
  Filled 2016-07-29: qty 5

## 2016-07-29 MED ORDER — SODIUM CHLORIDE 0.9 % IV SOLN
Freq: Once | INTRAVENOUS | Status: AC
  Administered 2016-07-29: 10:00:00 via INTRAVENOUS

## 2016-07-29 MED ORDER — HEPARIN SOD (PORK) LOCK FLUSH 100 UNIT/ML IV SOLN
500.0000 [IU] | Freq: Once | INTRAVENOUS | Status: AC | PRN
  Administered 2016-07-29: 500 [IU]
  Filled 2016-07-29: qty 5

## 2016-07-29 MED ORDER — DOXORUBICIN HCL CHEMO IV INJECTION 2 MG/ML
60.0000 mg/m2 | Freq: Once | INTRAVENOUS | Status: AC
  Administered 2016-07-29: 118 mg via INTRAVENOUS
  Filled 2016-07-29: qty 59

## 2016-07-29 MED ORDER — SODIUM CHLORIDE 0.9% FLUSH
10.0000 mL | INTRAVENOUS | Status: DC | PRN
  Administered 2016-07-29: 10 mL via INTRAVENOUS
  Filled 2016-07-29: qty 10

## 2016-07-29 MED ORDER — PALONOSETRON HCL INJECTION 0.25 MG/5ML
0.2500 mg | Freq: Once | INTRAVENOUS | Status: AC
  Administered 2016-07-29: 0.25 mg via INTRAVENOUS

## 2016-07-29 MED ORDER — PALONOSETRON HCL INJECTION 0.25 MG/5ML
INTRAVENOUS | Status: AC
  Filled 2016-07-29: qty 5

## 2016-07-29 MED ORDER — SODIUM CHLORIDE 0.9 % IV SOLN
600.0000 mg/m2 | Freq: Once | INTRAVENOUS | Status: AC
  Administered 2016-07-29: 1180 mg via INTRAVENOUS
  Filled 2016-07-29: qty 59

## 2016-07-29 NOTE — Patient Instructions (Signed)
[facility] Discharge Instructions for Patients Receiving Chemotherapy  Today you received the following chemotherapy agents:Adriamycin and Cytoxan   To help prevent nausea and vomiting after your treatment, we encourage you to take your nausea medication as directed.    If you develop nausea and vomiting that is not controlled by your nausea medication, call the clinic.   BELOW ARE SYMPTOMS THAT SHOULD BE REPORTED IMMEDIATELY:  *FEVER GREATER THAN 100.5 F  *CHILLS WITH OR WITHOUT FEVER  NAUSEA AND VOMITING THAT IS NOT CONTROLLED WITH YOUR NAUSEA MEDICATION  *UNUSUAL SHORTNESS OF BREATH  *UNUSUAL BRUISING OR BLEEDING  TENDERNESS IN MOUTH AND THROAT WITH OR WITHOUT PRESENCE OF ULCERS  *URINARY PROBLEMS  *BOWEL PROBLEMS  UNUSUAL RASH Items with * indicate a potential emergency and should be followed up as soon as possible.  Feel free to call the clinic you have any questions or concerns. The clinic phone number is [phone].  Please show the CHEMO ALERT CARD at check-in to the Emergency Department and triage nurse.   

## 2016-07-31 ENCOUNTER — Ambulatory Visit (HOSPITAL_BASED_OUTPATIENT_CLINIC_OR_DEPARTMENT_OTHER): Payer: Medicare HMO

## 2016-07-31 VITALS — BP 139/72 | HR 80 | Temp 98.0°F | Resp 18

## 2016-07-31 DIAGNOSIS — Z5189 Encounter for other specified aftercare: Secondary | ICD-10-CM

## 2016-07-31 DIAGNOSIS — C50911 Malignant neoplasm of unspecified site of right female breast: Secondary | ICD-10-CM | POA: Diagnosis not present

## 2016-07-31 MED ORDER — PEGFILGRASTIM INJECTION 6 MG/0.6ML ~~LOC~~
6.0000 mg | PREFILLED_SYRINGE | Freq: Once | SUBCUTANEOUS | Status: AC
  Administered 2016-07-31: 6 mg via SUBCUTANEOUS
  Filled 2016-07-31: qty 0.6

## 2016-07-31 NOTE — Patient Instructions (Signed)
Pegfilgrastim injection What is this medicine? PEGFILGRASTIM (PEG fil gra stim) is a long-acting granulocyte colony-stimulating factor that stimulates the growth of neutrophils, a type of white blood cell important in the body's fight against infection. It is used to reduce the incidence of fever and infection in patients with certain types of cancer who are receiving chemotherapy that affects the bone marrow, and to increase survival after being exposed to high doses of radiation. This medicine may be used for other purposes; ask your health care provider or pharmacist if you have questions. COMMON BRAND NAME(S): Neulasta What should I tell my health care provider before I take this medicine? They need to know if you have any of these conditions: -kidney disease -latex allergy -ongoing radiation therapy -sickle cell disease -skin reactions to acrylic adhesives (On-Body Injector only) -an unusual or allergic reaction to pegfilgrastim, filgrastim, other medicines, foods, dyes, or preservatives -pregnant or trying to get pregnant -breast-feeding How should I use this medicine? This medicine is for injection under the skin. If you get this medicine at home, you will be taught how to prepare and give the pre-filled syringe or how to use the On-body Injector. Refer to the patient Instructions for Use for detailed instructions. Use exactly as directed. Take your medicine at regular intervals. Do not take your medicine more often than directed. It is important that you put your used needles and syringes in a special sharps container. Do not put them in a trash can. If you do not have a sharps container, call your pharmacist or healthcare provider to get one. Talk to your pediatrician regarding the use of this medicine in children. While this drug may be prescribed for selected conditions, precautions do apply. Overdosage: If you think you have taken too much of this medicine contact a poison control  center or emergency room at once. NOTE: This medicine is only for you. Do not share this medicine with others. What if I miss a dose? It is important not to miss your dose. Call your doctor or health care professional if you miss your dose. If you miss a dose due to an On-body Injector failure or leakage, a new dose should be administered as soon as possible using a single prefilled syringe for manual use. What may interact with this medicine? Interactions have not been studied. Give your health care provider a list of all the medicines, herbs, non-prescription drugs, or dietary supplements you use. Also tell them if you smoke, drink alcohol, or use illegal drugs. Some items may interact with your medicine. This list may not describe all possible interactions. Give your health care provider a list of all the medicines, herbs, non-prescription drugs, or dietary supplements you use. Also tell them if you smoke, drink alcohol, or use illegal drugs. Some items may interact with your medicine. What should I watch for while using this medicine? You may need blood work done while you are taking this medicine. If you are going to need a MRI, CT scan, or other procedure, tell your doctor that you are using this medicine (On-Body Injector only). What side effects may I notice from receiving this medicine? Side effects that you should report to your doctor or health care professional as soon as possible: -allergic reactions like skin rash, itching or hives, swelling of the face, lips, or tongue -dizziness -fever -pain, redness, or irritation at site where injected -pinpoint red spots on the skin -red or dark-brown urine -shortness of breath or breathing problems -stomach or   side pain, or pain at the shoulder -swelling -tiredness -trouble passing urine or change in the amount of urine Side effects that usually do not require medical attention (report to your doctor or health care professional if they  continue or are bothersome): -bone pain -muscle pain This list may not describe all possible side effects. Call your doctor for medical advice about side effects. You may report side effects to FDA at 1-800-FDA-1088. Where should I keep my medicine? Keep out of the reach of children. Store pre-filled syringes in a refrigerator between 2 and 8 degrees C (36 and 46 degrees F). Do not freeze. Keep in carton to protect from light. Throw away this medicine if it is left out of the refrigerator for more than 48 hours. Throw away any unused medicine after the expiration date. NOTE: This sheet is a summary. It may not cover all possible information. If you have questions about this medicine, talk to your doctor, pharmacist, or health care provider.  2017 [employer] (2014-06-23 14:30:14) [facility] Discharge Instructions for Patients Receiving Chemotherapy  Today you received the following chemotherapy agent: Taxol.   To help prevent nausea and vomiting after your treatment, we encourage you to take your nausea medication as directed  If you develop nausea and vomiting that is not controlled by your nausea medication, call the clinic.   BELOW ARE SYMPTOMS THAT SHOULD BE REPORTED IMMEDIATELY:  *FEVER GREATER THAN 100.5 F  *CHILLS WITH OR WITHOUT FEVER  NAUSEA AND VOMITING THAT IS NOT CONTROLLED WITH YOUR NAUSEA MEDICATION  *UNUSUAL SHORTNESS OF BREATH  *UNUSUAL BRUISING OR BLEEDING  TENDERNESS IN MOUTH AND THROAT WITH OR WITHOUT PRESENCE OF ULCERS  *URINARY PROBLEMS  *BOWEL PROBLEMS  UNUSUAL RASH Items with * indicate a potential emergency and should be followed up as soon as possible.  Feel free to call the clinic you have any questions or concerns. The clinic phone number is [phone].

## 2016-08-08 ENCOUNTER — Ambulatory Visit: Payer: Medicare HMO | Admitting: Hematology

## 2016-08-09 NOTE — Progress Notes (Signed)
[place]  [phone] [phone]  Clinic Follow Up Note   Patient Care Team: [name], MD as PCP - General (Internal Medicine) 08/12/2016   CHIEF COMPLAINTS:  Follow up right breast cancer   Oncology History   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 [facility])   Staging form: Breast, AJCC 7th Edition   - Clinical stage from 11/30/2015: Stage IV (T4d, N3c, M1) - Signed by Truitt Merle, MD on 01/05/2016      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 [facility])   11/30/2015 Initial Diagnosis    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 [facility])      11/30/2015 Initial Biopsy    Right axilla lymph node biopsy showed metastatic poorly differentiated adenocarcinoma, IHC positive for CK AE1/AE3, CK7, E-cadherin, CDX-2, and focally very weak to equivocal staining for ER      12/21/2015 Imaging    Bilateral breast MRI showed extensive non-mass enhancement involving the entire right breast, right axillary, supraclavicular, and internal mammary adenopathy, new left axillary adenopathy      12/27/2015 Mammogram    Diagnostic mammogram and ultrasound of right breast and axilla showed diffuse skin thickening and increased density, no focal mass, numerous enlarged right axillary lymph node, the largest measuring 5.3cm.       12/27/2015 Initial Biopsy    Right breast core needle biopsy showed invasive ductal carcinoma, and DCIS, lymphovascular invasion identified      12/27/2015 Receptors her2    ER negative, PR negative, Ki-67 70%, HER-2 negative      01/03/2016 Imaging    PET scan showed diffuse asymmetric right breast hypermetabolic soft tissue density and skin thickening, hypermetabolic metastatic lymphadenopathy in bilateral axillary and subpectoral regions, right supraclavicular region and the right cervical level 5      01/10/2016 - 05/28/2016 Chemotherapy    Weekly Taxol 80 mg/m, second cycle was  postponed to 8/22 due to pt's non-compliance, stopped due to disease progression. She developed mild peripheral neuropathy in early Dec 2017.      02/07/2016 Pathology Results    Left axillary lymph node biopsy showed metastatic poorly differentiated carcinoma, triple negative.      04/24/2016 Imaging    CT CHEST, ABDOMEN AND PELVIS WITH CONTRAST IMPRESSION: 1. Bulky right axillary and mild right retropectoral and right supraclavicular lymphadenopathy is mildly decreased since 01/03/2016 PET-CT. 2. Left axillary lymphadenopathy is mildly increased. 3. Diffuse asymmetric thickening of the skin and fibroglandular soft tissues of the right breast is not appreciably changed. 4. No definite additional sites of metastatic disease in the chest, abdomen or pelvis. 5. Subcentimeter right liver lobe lesion is too small to characterize and is probably stable. 6. Additional findings include aortic atherosclerosis, 1 vessel coronary atherosclerosis, tiny hiatal hernia and myomatous uterus.      06/05/2016 Imaging    PET scan showed progressive right breast mass, extending in size and metabolic activity. Enlarging bilateral axillary adenopathy likewise in increased activity. A right supraclavicular lymph node is slightly smaller than before, no other new metastasis.       HISTORY OF PRESENTING ILLNESS (12/14/2015) :  [name] 71 y.o. 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 She is accompanied by her husband to the clinic today.  She noticed a lump at right axilla in 08/2015, no pain or other complains, she also report right breast swelling, no pain, skin erythema, nipple  discharge or other complain. She otherwise feels well. She was initially seen at urgent care in March, then was referred to establish her care with prior care physician at [facility] internal medicine center. Her screening mammogram was negative in January 2017. She subsequently underwent  CT chest on 09/27/2015 which showed enlarged and inflamed right breast, bulky right axillary lymph nodes. She was referred to breast Center for diagnostic mammogram and ultrasound of right breast, which was negative. She underwent ultrasound guided right axillary node biopsy on 12/05/2015, which reviewed port differentiated carcinoma. ER weakly focally positive.  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 She works independently. She lives with her husband. She denies any fever, night sweats, chills, or skin itchiness.  CURRENT THERAPY: second line chemotherapy with AC, every 2 weeks, started on 06/21/2016  INTERIM HISTORY:  LSharonnereturns for follow up. She is doing well. Her insurance hasn't approved her PET scan yet. She has some right breast pain that comes and goes. She has a burning sensation and peeling skin on the bottoms of her feet. After her last cycle of chemo, she had some nausea and vomiting for a couple of days, but then she felt better. Some tingling is present on her toes. Denies abdominal pain, or any other concerns.   MEDICAL HISTORY:  Past Medical History:  Diagnosis Date  . Arthritis   . GERD (gastroesophageal reflux disease)   . Hypertension   . Shortness of breath   . Sleep apnea   . Stroke ([facility]     SURGICAL HISTORY: Past Surgical History:  Procedure Laterality Date  . IR GENERIC HISTORICAL  06/13/2016   IR [place] V[place] ACCESS LEFT 06/13/2016 JCorrie Mckusick DO [facility]-INTERV RAD  . IR GENERIC HISTORICAL  06/13/2016   IR FLUORO GUIDE PORT INSERTION LEFT 06/13/2016 JCorrie Mckusick DO [facility]INTER[facility]  . NO PAST SURGERIES      SOCIAL HISTORY: Social History   Social  History  . Marital status: Married    Spouse name: N/A  . Number of children: 4  . Years of education: 8 TH   Occupational History  . Not on file.   Social History Main Topics  . Smoking status: Former Smoker    Packs/day: 0.50    Years: 1.00    Types: Cigarettes    Start date: 06/17/1968    Quit date: 06/17/1973  . Smokeless tobacco: Never Used     Comment: Quit in 89s . Alcohol use No     Comment: former drinker for approximately 10 years  . Drug use: No  . Sexual activity: Not on file   Other Topics Concern  . Not on file   Social History Narrative   Patient is married with 3 children still living and 1 deceased son.   Patient is right handed.   Patient has 8 th grade education.   Patient drinks 2-3 servings daily.    FAMILY HISTORY: Family History  Problem Relation Age of Onset  . Heart disease Mother   . Cancer Father 766   throat cancer  . Cancer Cousin     breast cancer  ALLERGIES:  has No Known Allergies.  MEDICATIONS:  Current Outpatient Prescriptions  Medication Sig Dispense Refill  . amLODipine (NORVASC) 10 MG tablet Take 1 tablet (10 mg total) by mouth daily. 90 tablet 3  . aspirin EC 81 MG tablet Take 1 tablet (81 mg total) by mouth daily. 90 tablet 3  . atorvastatin (LIPITOR) 20 MG tablet TAKE 1 TABLET EVERY DAY (Patient taking differently: Take 20 mg by mouth daily) 90 tablet 3  . furosemide (LASIX) 20 MG tablet Take 20 mg by mouth daily.     Marland Kitchen lidocaine-prilocaine (EMLA) cream Apply to port @ 1 hour prior to treatment. 30 g 1  . metoprolol tartrate (LOPRESSOR) 25 MG tablet TAKE 1 TABLET EVERY DAY 90 tablet 3  . ondansetron (ZOFRAN) 8 MG tablet Take by mouth every 8 (eight) hours as needed for nausea or vomiting.    . prochlorperazine (COMPAZINE) 10 MG tablet Take 10 mg by mouth every 6 (six) hours as needed for nausea or vomiting.    . traMADol (ULTRAM) 50 MG tablet Take 1 tablet (50 mg total) by mouth every 8 (eight) hours as needed. 60 tablet  1   No current facility-administered medications for this visit.    Facility-Administered Medications Ordered in Other Visits  Medication Dose Route Frequency Provider Last Rate Last Dose  . cyclophosphamide (CYTOXAN) 1,180 mg in sodium chloride 0.9 % 250 mL chemo infusion  600 mg/m2 (Treatment Plan Recorded) Intravenous Once Truitt Merle, MD      . DOXOrubicin (ADRIAMYCIN) chemo injection 118 mg  60 mg/m2 (Treatment Plan Recorded) Intravenous Once Truitt Merle, MD      . heparin lock flush 100 unit/mL  500 Units Intracatheter Once PRN Truitt Merle, MD      . sodium chloride flush (NS) 0.9 % injection 10 mL  10 mL Intravenous PRN Truitt Merle, MD   10 mL at 08/12/16 1335  . sodium chloride flush (NS) 0.9 % injection 10 mL  10 mL Intracatheter PRN Truitt Merle, MD           REVIEW OF SYSTEMS: Constitutional: Denies fevers, chills or abnormal night sweats (+) right breast pain Eyes: Denies blurriness of vision, double vision or watery eyes Ears, nose, mouth, throat, and face: Denies mucositis or sore throat Respiratory: Denies cough, dyspnea or wheezes Cardiovascular: Denies palpitation, chest discomfort or lower extremity swelling Gastrointestinal:  Denies nausea, heartburn or change in bowel habits Skin: no skin rashes. (+) Peeling skin of the sole of her feet. Lymphatics: Denies new lymphadenopathy or easy bruising Neurological: (+) Burning bottom of feet, tingling toes Behavioral/Psych: Mood is stable, no new changes  All other systems were reviewed with the patient and are negative.  PHYSICAL EXAMINATION: ECOG PERFORMANCE STATUS: 1  Vitals:   08/12/16 1352  BP: 135/68  Pulse: 98  Resp: 18  Temp: 98.4 F (36.9 C)   Filed Weights   08/12/16 1352  Weight: 178 lb 9.6 oz (81 kg)   GENERAL:alert, no distress and comfortable SKIN: skin color, texture, turgor are normal, no rashes or significant lesions EYES: normal, conjunctiva are pink and non-injected, sclera clear OROPHARYNX:no exudate, no  erythema and lips, buccal mucosa, and tongue normal  NECK: supple, thyroid normal size, non-tender, without nodularity LYMPH:  no palpable lymphadenopathy in the cervical, axillary or inguinal LUNGS: clear to auscultation and percussion with normal breathing effort HEART: regular rate & rhythm and no murmurs and no lower extremity edema ABDOMEN:abdomen soft, non-tender and normal bowel sounds Musculoskeletal:no cyanosis of  digits and no clubbing  PSYCH: alert & oriented x 3 with fluent speech NEURO: Decreased vibration sensation on bilateral ankle and wrist. SKIN: (+) Dry skin of the bilateral feet, no sign of infection. EXTREMITIES: Bilateral hand and lower extremity edema. Breasts: her entire right breast is very firm, with associated skin erythema and edema which appears slightly improved overall. There is a 4 x 2.5 cm mass at the anterior right axilla, close to right chest wall of breast margin, no tenderness. Left axilla moveable lymph node measuring 2.5 cm.   LABORATORY DATA:  I have reviewed the data as listed CBC Latest Ref Rng & Units 08/12/2016 07/29/2016 07/15/2016  WBC 3.9 - 10.3 10e3/uL 7.6 8.2 5.6  Hemoglobin 11.6 - 15.9 g/dL 9.3(L) 9.8(L) 10.2(L)  Hematocrit 34.8 - 46.6 % 29.2(L) 29.7(L) 31.2(L)  Platelets 145 - 400 10e3/uL 194 159 270   CMP Latest Ref Rng & Units 08/12/2016 07/29/2016 07/15/2016  Glucose 70 - 140 mg/dl 95 90 97  BUN 7.0 - 26.0 mg/dL 14.5 27.5(H) 23.9  Creatinine 0.6 - 1.1 mg/dL 1.4(H) 1.1 0.9  Sodium 136 - 145 mEq/L 141 143 143  Potassium 3.5 - 5.1 mEq/L 3.8 4.0 4.2  Chloride 101 - 111 mmol/L - - -  CO2 22 - 29 mEq/L 27 27 26   Calcium 8.4 - 10.4 mg/dL 9.1 9.0 9.4  Total Protein 6.4 - 8.3 g/dL 7.3 7.1 7.2  Total Bilirubin 0.20 - 1.20 mg/dL 0.28 0.32 0.35  Alkaline Phos 40 - 150 U/L 70 69 53  AST 5 - 34 U/L 14 13 14   ALT 0 - 55 U/L 9 8 12    CA27.29: 01/05/2016: 155.9 02/27/2016: 96.7 03/26/2016: 64.7 04/23/2016: 65.4 05/21/2016: 99.2 07/15/2016: 215.4    08/12/2016: PENDING  PATHOLOGY REPORT: Diagnosis 11/30/2015 Lymph node, needle/core biopsy, right breast/axilla - LYMPH NODE POSITIVE FOR METASTATIC POORLY DIFFERENTIATED CARCINOMA. - SEE COMMENT. Microscopic Comment Immunohistochemical stains are performed. The tumor is positive for cytokeratin AE1/AE3, cytokeratin 7 and E-cadherin. CDX-2 demonstrates weak positivity. Estrogen receptor demonstrates focal very weak to equivocal staining. Gross cystic disease fluid protein, cytokeratin 20, TTF-1, Melan-A and S100 are all negative. The findings are consistent with a poorly differentiated carcinoma. Given the staining pattern, an upper gastrointestinal and pancreatobiliary primary source should be ruled out. In addition, given the axillary location of the biopsy and focal weak to equivocal estrogen receptor staining, ruling out a poorly differentiated carcinoma of breast primary source is also prudent. Lastly, a gynecologic primary may be considered. Correlation with clinical and radiologic impression is essential. Dr. Vicente Males has seen this case in consultation with essential agreement of the above diagnosis and comment. The findings are called to the [facility] on 12/04/15. (RAH:gt, 12/04/15)  Diagnosis 12/27/2015 Breast, right, needle core biopsy, central - INVASIVE MAMMARY CARCINOMA. - MAMMARY CARCINOMA IN SITU. - LYMPH/VASCULAR INVASION IS IDENTIFIED. - SEE COMMENT. Microscopic Comment An E-cadherin stain will be performed to determine if the carcinoma is ductal or lobular in nature. The results of the stain will be reported in an addendum to follow. Although definitive grading of breast carcinoma is best done on excision, the features of the invasive tumor from the right central breast biopsy are compatible with a grade 3 breast carcinoma. Breast prognostic markers will be performed and reported in an addendum. Findings are called to the [facility] on  12/28/2015. Dr. Lyndon Code has seen this case in consultation with agreement. (RH:kh 12-28-15) ADDENDUM: An E-cadherin immunohistochemical stain is performed which  is positive in both the invasive and in situ components confirming the ductal nature of both (i.e. invasive ductal carcinoma with ductal carcinoma in situ). (RAH:gt, 12/29/15) PROGNOSTIC INDICATORS Results: IMMUNOHISTOCHEMICAL AND MORPHOMETRIC ANALYSIS PERFORMED MANUALLY Estrogen Receptor: 0%, NEGATIVE Progesterone Receptor: 0%, NEGATIVE Proliferation Marker Ki67: 70% Results: HER2 - NEGATIVE RATIO OF HER2/CEP17 SIGNALS 1.13 AVERAGE HER2 COPY NUMBER PER CELL 1.75  Diagnosis 02/07/2016 Lymph node, needle/core biopsy, left axillary - METASTATIC POORLY DIFFERENTIATED CARCINOMA. Results: IMMUNOHISTOCHEMICAL AND MORPHOMETRIC ANALYSIS PERFORMED MANUALLY Estrogen Receptor: 0%, NEGATIVE Progesterone Receptor: 0%, NEGATIVE Results: HER2 - NEGATIVE RATIO OF HER2/CEP17 SIGNALS 1.21 AVERAGE HER2 COPY NUMBER PER CELL 2.05  RADIOGRAPHIC STUDIES: I have personally reviewed the radiological images as listed and agreed with the findings in the report.  PET 06/05/2016 IMPRESSION: 1. Progressive right breast mass, expanding in size and metabolic activity. Enlarging but bilateral axillary adenopathy likewise increased activity. A right supraclavicular lymph node is slightly smaller than previous but has a higher standard uptake value. Overall appearance compatible with progression but no further new metastatic spread compared to 01/03/2016. 2. Coronary, aortic arch, and branch vessel atherosclerotic vascular disease. Aortoiliac atherosclerotic vascular disease. 3. Extensive edema along the anterior chest wall especially both breasts, right greater than left.  ASSESSMENT & PLAN:  71 y.o. female, with past medical history of HTN, GERD, sleep apnea, presented with bulky right axillary adenopathy.  1. 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 Unfortunately her cancer has metastasized to left axilla and cervical lymph nodes, which are distant metastasis.  -We previously reviewed the natural history of metastatic triple negative breast cancer, which is very aggressive, and her cancer is incurable at this stage  -Giving the metastatic disease, surgery up front is not indicated, I recommended systemic chemotherapy. -She has been on weekly Taxol for 4-5 month -I previously reviewed her restaging CT scan findings from 04/24/16, which showed slight improvement in the right-sided adenopathy, no other new metastasis. -I previously reviewed her restaging PET scan from 05/2016, which showed significant cancer progression in the right breast, no other sternal metastasis. - I recommend swiitch her chemotherapy to Adriamycin and Cytoxan, for rapid disease control, to prevent bleeding and infection from the primary breast cancer. -She has had 3 cycles of AC, treatment was postponed multiple times due to compliance issue or scan. Her right breast lesion and axillary lymph node seemed to be slightly improved, lab reviewed, adequate for treatment, we'll proceed to cycle 4 chemotherapy today -We will obtain a restaging PET in the next few weeks. The skin has not been approved by her insurance yet. If it's denied, I'll change his CT and bone scan.   2. ANEMIA in neoplastic disease -She developed normocytic mild anemia in the past few months. -Her anemia workup shows a normal folic acid and Y58 level,  increased ferritin, decreased serum iron, TIBC and transferrin saturation. This is most consistent with anemia of chronic disease, secondary to her CKD and underlying malignancy.  -I previously encouraged her to continue oral iron supplement  3. CKD stage III -Her GFR is in 40-50's  -No  lab signs of tumor lysis -Possibly related to her underlying hypertension and diuretics  -Her Cr has much improved since she stopped her Lasix. -Avoid nephrotoxins, such as NSAIDs and IV CT contrast  4. HTN, GERD, obesity, sleep apnea -She'll continue follow-up with her primary care physician  5. Right breast pain -secondary to breast cancer, improved lately after chemo  -continue tramadol as needed   6. Goal of care discussion  -We again discussed the incurable nature of her cancer, and the overall poor prognosis, especially if she does not have good response to chemotherapy or progress on chemo -The patient understands the goal of care is palliative. -I recommend DNR/DNI, she will think about it   PLAN -we'll proceed to 4th cycle chemotherapy AC today -Will hear from insurance company within 2 days. If PET is approved, proceed with that. If it is denied, order CT and bone scan to be done before next f/u.  -Lab, flush, and f/u in 2 weeks.   I spent 20 minutes counseling the patient face to face. The total time spent in the appointment was 25 minutes and more than 50% was on counseling.  This document serves as a record of services personally performed by Truitt Merle, MD. It was created on her behalf by Martinique Casey, a trained medical scribe. The creation of this record is based on the scribe's personal observations and the provider's statements to them. This document has been checked and approved by the attending provider.  I have reviewed the above documentation for accuracy and completeness and I agree with the above.    Truitt Merle, MD 08/12/2016

## 2016-08-12 ENCOUNTER — Other Ambulatory Visit (HOSPITAL_BASED_OUTPATIENT_CLINIC_OR_DEPARTMENT_OTHER): Payer: Medicare HMO

## 2016-08-12 ENCOUNTER — Ambulatory Visit (HOSPITAL_BASED_OUTPATIENT_CLINIC_OR_DEPARTMENT_OTHER): Payer: Medicare HMO

## 2016-08-12 ENCOUNTER — Ambulatory Visit (HOSPITAL_BASED_OUTPATIENT_CLINIC_OR_DEPARTMENT_OTHER): Payer: Medicare HMO | Admitting: Hematology

## 2016-08-12 ENCOUNTER — Encounter: Payer: Self-pay | Admitting: Hematology

## 2016-08-12 VITALS — BP 135/68 | HR 98 | Temp 98.4°F | Resp 18 | Ht 64.0 in | Wt 178.6 lb

## 2016-08-12 DIAGNOSIS — I10 Essential (primary) hypertension: Secondary | ICD-10-CM | POA: Diagnosis not present

## 2016-08-12 DIAGNOSIS — C799 Secondary malignant neoplasm of unspecified site: Secondary | ICD-10-CM | POA: Diagnosis not present

## 2016-08-12 DIAGNOSIS — C773 Secondary and unspecified malignant neoplasm of axilla and upper limb lymph nodes: Secondary | ICD-10-CM | POA: Diagnosis not present

## 2016-08-12 DIAGNOSIS — G4733 Obstructive sleep apnea (adult) (pediatric): Secondary | ICD-10-CM

## 2016-08-12 DIAGNOSIS — N183 Chronic kidney disease, stage 3 unspecified: Secondary | ICD-10-CM

## 2016-08-12 DIAGNOSIS — Z5111 Encounter for antineoplastic chemotherapy: Secondary | ICD-10-CM | POA: Diagnosis not present

## 2016-08-12 DIAGNOSIS — C50911 Malignant neoplasm of unspecified site of right female breast: Secondary | ICD-10-CM

## 2016-08-12 DIAGNOSIS — Z95828 Presence of other vascular implants and grafts: Secondary | ICD-10-CM

## 2016-08-12 DIAGNOSIS — D630 Anemia in neoplastic disease: Secondary | ICD-10-CM

## 2016-08-12 DIAGNOSIS — E669 Obesity, unspecified: Secondary | ICD-10-CM

## 2016-08-12 LAB — COMPREHENSIVE METABOLIC PANEL
ALT: 9 U/L (ref 0–55)
ANION GAP: 7 meq/L (ref 3–11)
AST: 14 U/L (ref 5–34)
Albumin: 4.1 g/dL (ref 3.5–5.0)
Alkaline Phosphatase: 70 U/L (ref 40–150)
BILIRUBIN TOTAL: 0.28 mg/dL (ref 0.20–1.20)
BUN: 14.5 mg/dL (ref 7.0–26.0)
CO2: 27 mEq/L (ref 22–29)
CREATININE: 1.4 mg/dL — AB (ref 0.6–1.1)
Calcium: 9.1 mg/dL (ref 8.4–10.4)
Chloride: 107 mEq/L (ref 98–109)
EGFR: 45 mL/min/{1.73_m2} — ABNORMAL LOW (ref >90)
Glucose: 95 mg/dl (ref 70–140)
Potassium: 3.8 mEq/L (ref 3.5–5.1)
Sodium: 141 mEq/L (ref 136–145)
TOTAL PROTEIN: 7.3 g/dL (ref 6.4–8.3)

## 2016-08-12 LAB — CBC WITH DIFFERENTIAL/PLATELET
BASO%: 0.9 % (ref 0.0–2.0)
Basophils Absolute: 0.1 10*3/uL (ref 0.0–0.1)
EOS ABS: 0 10*3/uL (ref 0.0–0.5)
EOS%: 0.5 % (ref 0.0–7.0)
HEMATOCRIT: 29.2 % — AB (ref 34.8–46.6)
HGB: 9.3 g/dL — ABNORMAL LOW (ref 11.6–15.9)
LYMPH%: 6 % — AB (ref 14.0–49.7)
MCH: 28.6 pg (ref 25.1–34.0)
MCHC: 31.8 g/dL (ref 31.5–36.0)
MCV: 89.8 fL (ref 79.5–101.0)
MONO#: 1.3 10*3/uL — AB (ref 0.1–0.9)
MONO%: 17.3 % — ABNORMAL HIGH (ref 0.0–14.0)
NEUT#: 5.7 10*3/uL (ref 1.5–6.5)
NEUT%: 75.3 % (ref 38.4–76.8)
PLATELETS: 194 10*3/uL (ref 145–400)
RBC: 3.25 10*6/uL — AB (ref 3.70–5.45)
RDW: 18.3 % — ABNORMAL HIGH (ref 11.2–14.5)
WBC: 7.6 10*3/uL (ref 3.9–10.3)
lymph#: 0.5 10*3/uL — ABNORMAL LOW (ref 0.9–3.3)
nRBC: 0 % (ref 0–0)

## 2016-08-12 MED ORDER — SODIUM CHLORIDE 0.9% FLUSH
10.0000 mL | INTRAVENOUS | Status: DC | PRN
  Filled 2016-08-12: qty 10

## 2016-08-12 MED ORDER — HEPARIN SOD (PORK) LOCK FLUSH 100 UNIT/ML IV SOLN
500.0000 [IU] | Freq: Once | INTRAVENOUS | Status: AC | PRN
  Administered 2016-08-12: 500 [IU]
  Filled 2016-08-12: qty 5

## 2016-08-12 MED ORDER — DOXORUBICIN HCL CHEMO IV INJECTION 2 MG/ML
60.0000 mg/m2 | Freq: Once | INTRAVENOUS | Status: AC
  Administered 2016-08-12: 118 mg via INTRAVENOUS
  Filled 2016-08-12: qty 59

## 2016-08-12 MED ORDER — PALONOSETRON HCL INJECTION 0.25 MG/5ML
0.2500 mg | Freq: Once | INTRAVENOUS | Status: AC
  Administered 2016-08-12: 0.25 mg via INTRAVENOUS

## 2016-08-12 MED ORDER — SODIUM CHLORIDE 0.9% FLUSH
10.0000 mL | INTRAVENOUS | Status: DC | PRN
Start: 2016-08-12 — End: 2017-06-06
  Administered 2016-08-12 (×2): 10 mL via INTRAVENOUS
  Filled 2016-08-12: qty 10

## 2016-08-12 MED ORDER — PALONOSETRON HCL INJECTION 0.25 MG/5ML
INTRAVENOUS | Status: AC
  Filled 2016-08-12: qty 5

## 2016-08-12 MED ORDER — SODIUM CHLORIDE 0.9 % IV SOLN
INTRAVENOUS | Status: DC
  Administered 2016-08-12: 16:00:00 via INTRAVENOUS

## 2016-08-12 MED ORDER — CYCLOPHOSPHAMIDE CHEMO INJECTION 1 GM
600.0000 mg/m2 | Freq: Once | INTRAMUSCULAR | Status: AC
  Administered 2016-08-12: 1180 mg via INTRAVENOUS
  Filled 2016-08-12: qty 59

## 2016-08-12 MED ORDER — SODIUM CHLORIDE 0.9 % IV SOLN
Freq: Once | INTRAVENOUS | Status: AC
  Administered 2016-08-12: 15:00:00 via INTRAVENOUS

## 2016-08-12 MED ORDER — SODIUM CHLORIDE 0.9 % IV SOLN
Freq: Once | INTRAVENOUS | Status: AC
  Administered 2016-08-12: 15:00:00 via INTRAVENOUS
  Filled 2016-08-12: qty 5

## 2016-08-12 NOTE — Patient Instructions (Signed)
[facility] Discharge Instructions for Patients Receiving Chemotherapy  Today you received the following chemotherapy agents:Adriamycin and Cytoxan   To help prevent nausea and vomiting after your treatment, we encourage you to take your nausea medication as directed.    If you develop nausea and vomiting that is not controlled by your nausea medication, call the clinic.   BELOW ARE SYMPTOMS THAT SHOULD BE REPORTED IMMEDIATELY:  *FEVER GREATER THAN 100.5 F  *CHILLS WITH OR WITHOUT FEVER  NAUSEA AND VOMITING THAT IS NOT CONTROLLED WITH YOUR NAUSEA MEDICATION  *UNUSUAL SHORTNESS OF BREATH  *UNUSUAL BRUISING OR BLEEDING  TENDERNESS IN MOUTH AND THROAT WITH OR WITHOUT PRESENCE OF ULCERS  *URINARY PROBLEMS  *BOWEL PROBLEMS  UNUSUAL RASH Items with * indicate a potential emergency and should be followed up as soon as possible.  Feel free to call the clinic you have any questions or concerns. The clinic phone number is [phone].  Please show the CHEMO ALERT CARD at check-in to the Emergency Department and triage nurse.   

## 2016-08-13 LAB — CANCER ANTIGEN 27.29: CAN 27.29: 176.9 U/mL — AB (ref 0.0–38.6)

## 2016-08-14 ENCOUNTER — Ambulatory Visit (HOSPITAL_BASED_OUTPATIENT_CLINIC_OR_DEPARTMENT_OTHER): Payer: Medicare HMO

## 2016-08-14 ENCOUNTER — Other Ambulatory Visit: Payer: Self-pay | Admitting: Internal Medicine

## 2016-08-14 ENCOUNTER — Telehealth: Payer: Self-pay | Admitting: Hematology

## 2016-08-14 VITALS — BP 148/52 | HR 68 | Temp 97.7°F | Resp 20

## 2016-08-14 DIAGNOSIS — C50911 Malignant neoplasm of unspecified site of right female breast: Secondary | ICD-10-CM

## 2016-08-14 DIAGNOSIS — Z5189 Encounter for other specified aftercare: Secondary | ICD-10-CM

## 2016-08-14 MED ORDER — PEGFILGRASTIM INJECTION 6 MG/0.6ML ~~LOC~~
6.0000 mg | PREFILLED_SYRINGE | Freq: Once | SUBCUTANEOUS | Status: AC
  Administered 2016-08-14: 6 mg via SUBCUTANEOUS
  Filled 2016-08-14: qty 0.6

## 2016-08-14 NOTE — Telephone Encounter (Signed)
Appointments scheduled per 08/12/16 los. Patient was given a copy of the AVS report and appointment schedule per 08/12/16 los. °

## 2016-08-14 NOTE — Patient Instructions (Signed)
Pegfilgrastim injection What is this medicine? PEGFILGRASTIM (PEG fil gra stim) is a long-acting granulocyte colony-stimulating factor that stimulates the growth of neutrophils, a type of white blood cell important in the body's fight against infection. It is used to reduce the incidence of fever and infection in patients with certain types of cancer who are receiving chemotherapy that affects the bone marrow, and to increase survival after being exposed to high doses of radiation. This medicine may be used for other purposes; ask your health care provider or pharmacist if you have questions. COMMON BRAND NAME(S): Neulasta What should I tell my health care provider before I take this medicine? They need to know if you have any of these conditions: -kidney disease -latex allergy -ongoing radiation therapy -sickle cell disease -skin reactions to acrylic adhesives (On-Body Injector only) -an unusual or allergic reaction to pegfilgrastim, filgrastim, other medicines, foods, dyes, or preservatives -pregnant or trying to get pregnant -breast-feeding How should I use this medicine? This medicine is for injection under the skin. If you get this medicine at home, you will be taught how to prepare and give the pre-filled syringe or how to use the On-body Injector. Refer to the patient Instructions for Use for detailed instructions. Use exactly as directed. Take your medicine at regular intervals. Do not take your medicine more often than directed. It is important that you put your used needles and syringes in a special sharps container. Do not put them in a trash can. If you do not have a sharps container, call your pharmacist or healthcare provider to get one. Talk to your pediatrician regarding the use of this medicine in children. While this drug may be prescribed for selected conditions, precautions do apply. Overdosage: If you think you have taken too much of this medicine contact a poison control  center or emergency room at once. NOTE: This medicine is only for you. Do not share this medicine with others. What if I miss a dose? It is important not to miss your dose. Call your doctor or health care professional if you miss your dose. If you miss a dose due to an On-body Injector failure or leakage, a new dose should be administered as soon as possible using a single prefilled syringe for manual use. What may interact with this medicine? Interactions have not been studied. Give your health care provider a list of all the medicines, herbs, non-prescription drugs, or dietary supplements you use. Also tell them if you smoke, drink alcohol, or use illegal drugs. Some items may interact with your medicine. This list may not describe all possible interactions. Give your health care provider a list of all the medicines, herbs, non-prescription drugs, or dietary supplements you use. Also tell them if you smoke, drink alcohol, or use illegal drugs. Some items may interact with your medicine. What should I watch for while using this medicine? You may need blood work done while you are taking this medicine. If you are going to need a MRI, CT scan, or other procedure, tell your doctor that you are using this medicine (On-Body Injector only). What side effects may I notice from receiving this medicine? Side effects that you should report to your doctor or health care professional as soon as possible: -allergic reactions like skin rash, itching or hives, swelling of the face, lips, or tongue -dizziness -fever -pain, redness, or irritation at site where injected -pinpoint red spots on the skin -red or dark-brown urine -shortness of breath or breathing problems -stomach or   side pain, or pain at the shoulder -swelling -tiredness -trouble passing urine or change in the amount of urine Side effects that usually do not require medical attention (report to your doctor or health care professional if they  continue or are bothersome): -bone pain -muscle pain This list may not describe all possible side effects. Call your doctor for medical advice about side effects. You may report side effects to FDA at 1-800-FDA-1088. Where should I keep my medicine? Keep out of the reach of children. Store pre-filled syringes in a refrigerator between 2 and 8 degrees C (36 and 46 degrees F). Do not freeze. Keep in carton to protect from light. Throw away this medicine if it is left out of the refrigerator for more than 48 hours. Throw away any unused medicine after the expiration date. NOTE: This sheet is a summary. It may not cover all possible information. If you have questions about this medicine, talk to your doctor, pharmacist, or health care provider.  2017 [employer] (2014-06-23 14:30:14) [facility] Discharge Instructions for Patients Receiving Chemotherapy  Today you received the following chemotherapy agent: Taxol.   To help prevent nausea and vomiting after your treatment, we encourage you to take your nausea medication as directed  If you develop nausea and vomiting that is not controlled by your nausea medication, call the clinic.   BELOW ARE SYMPTOMS THAT SHOULD BE REPORTED IMMEDIATELY:  *FEVER GREATER THAN 100.5 F  *CHILLS WITH OR WITHOUT FEVER  NAUSEA AND VOMITING THAT IS NOT CONTROLLED WITH YOUR NAUSEA MEDICATION  *UNUSUAL SHORTNESS OF BREATH  *UNUSUAL BRUISING OR BLEEDING  TENDERNESS IN MOUTH AND THROAT WITH OR WITHOUT PRESENCE OF ULCERS  *URINARY PROBLEMS  *BOWEL PROBLEMS  UNUSUAL RASH Items with * indicate a potential emergency and should be followed up as soon as possible.  Feel free to call the clinic you have any questions or concerns. The clinic phone number is [phone].

## 2016-08-23 ENCOUNTER — Encounter (HOSPITAL_COMMUNITY)
Admission: RE | Admit: 2016-08-23 | Discharge: 2016-08-23 | Disposition: A | Discharge status: Home / Self Care | Payer: Medicare HMO | Source: Ambulatory Visit | Attending: Hematology | Admitting: Hematology

## 2016-08-23 DIAGNOSIS — I2510 Atherosclerotic heart disease of native coronary artery without angina pectoris: Secondary | ICD-10-CM | POA: Diagnosis not present

## 2016-08-23 DIAGNOSIS — C799 Secondary malignant neoplasm of unspecified site: Secondary | ICD-10-CM | POA: Insufficient documentation

## 2016-08-23 DIAGNOSIS — C50911 Malignant neoplasm of unspecified site of right female breast: Secondary | ICD-10-CM | POA: Diagnosis not present

## 2016-08-23 LAB — GLUCOSE, CAPILLARY: GLUCOSE-CAPILLARY: 81 mg/dL (ref 65–99)

## 2016-08-23 MED ORDER — FLUDEOXYGLUCOSE F - 18 (FDG) INJECTION
8.6300 | Freq: Once | INTRAVENOUS | Status: AC | PRN
Start: 2016-08-23 — End: 2016-08-23
  Administered 2016-08-23: 8.63 via INTRAVENOUS

## 2016-08-26 ENCOUNTER — Ambulatory Visit: Payer: Medicare HMO | Admitting: Hematology

## 2016-08-26 ENCOUNTER — Other Ambulatory Visit: Payer: Self-pay | Admitting: Hematology

## 2016-08-26 ENCOUNTER — Other Ambulatory Visit: Payer: Medicare HMO

## 2016-08-26 NOTE — Progress Notes (Signed)
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 I would be surprised if this patient died in the next year  Intent of Therapy: Non-Curative / Palliative Intent, Discussed with Patient

## 2016-08-27 ENCOUNTER — Telehealth: Payer: Self-pay | Admitting: Hematology

## 2016-08-27 NOTE — Telephone Encounter (Signed)
sw pt to confirm 3/19 appt at 115 pm per LOS

## 2016-08-27 NOTE — Progress Notes (Signed)
[place]  [phone] [phone]  Clinic Follow Up Note   Patient Care Team: [name], MD as PCP - General (Internal Medicine) 09/02/2016   CHIEF COMPLAINTS:  Follow up right breast cancer   Oncology History   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 [facility])   Staging form: Breast, AJCC 7th Edition   - Clinical stage from 11/30/2015: Stage IV (T4d, N3c, M1) - Signed by Truitt Merle, MD on 01/05/2016      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 [facility])   11/30/2015 Initial Diagnosis    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 [facility])      11/30/2015 Initial Biopsy    Right axilla lymph node biopsy showed metastatic poorly differentiated adenocarcinoma, IHC positive for CK AE1/AE3, CK7, E-cadherin, CDX-2, and focally very weak to equivocal staining for ER      12/21/2015 Imaging    Bilateral breast MRI showed extensive non-mass enhancement involving the entire right breast, right axillary, supraclavicular, and internal mammary adenopathy, new left axillary adenopathy      12/27/2015 Mammogram    Diagnostic mammogram and ultrasound of right breast and axilla showed diffuse skin thickening and increased density, no focal mass, numerous enlarged right axillary lymph node, the largest measuring 5.3cm.       12/27/2015 Initial Biopsy    Right breast core needle biopsy showed invasive ductal carcinoma, and DCIS, lymphovascular invasion identified      12/27/2015 Receptors her2    ER negative, PR negative, Ki-67 70%, HER-2 negative      01/03/2016 Imaging    PET scan showed diffuse asymmetric right breast hypermetabolic soft tissue density and skin thickening, hypermetabolic metastatic lymphadenopathy in bilateral axillary and subpectoral regions, right supraclavicular region and the right cervical level 5      01/10/2016 - 05/28/2016 Chemotherapy    Weekly Taxol 80 mg/m, second cycle was  postponed to 8/22 due to pt's non-compliance, stopped due to disease progression. She developed mild peripheral neuropathy in early Dec 2017.      02/07/2016 Pathology Results    Left axillary lymph node biopsy showed metastatic poorly differentiated carcinoma, triple negative.      04/24/2016 Imaging    CT CHEST, ABDOMEN AND PELVIS WITH CONTRAST IMPRESSION: 1. Bulky right axillary and mild right retropectoral and right supraclavicular lymphadenopathy is mildly decreased since 01/03/2016 PET-CT. 2. Left axillary lymphadenopathy is mildly increased. 3. Diffuse asymmetric thickening of the skin and fibroglandular soft tissues of the right breast is not appreciably changed. 4. No definite additional sites of metastatic disease in the chest, abdomen or pelvis. 5. Subcentimeter right liver lobe lesion is too small to characterize and is probably stable. 6. Additional findings include aortic atherosclerosis, 1 vessel coronary atherosclerosis, tiny hiatal hernia and myomatous uterus.      06/05/2016 Imaging    PET scan showed progressive right breast mass, extending in size and metabolic activity. Enlarging bilateral axillary adenopathy likewise in increased activity. A right supraclavicular lymph node is slightly smaller than before, no other new metastasis.      08/23/2016 PET scan    1. Response to therapy. Significant decrease in size and hypermetabolism of right breast mass and bilateral axillary nodal metastasis. Hypermetabolism within a right supraclavicular node has resolved. 2. New soft tissue fullness and hypermetabolism within the lateral left breast, suspicious for either metastatic disease or new primary. Mastitis could look similar. 3. No extrathoracic metastatic disease  identified. 4.  Coronary artery atherosclerosis. Aortic atherosclerosis. 5. Diffuse marrow hypermetabolism is likely related to stimulation by chemotherapy. This decreases sensitivity for osseous metastasis.         HISTORY OF PRESENTING ILLNESS (12/14/2015) :  [name] 71 y.o. 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 She is accompanied by her husband to the clinic today.  She noticed a lump at right axilla in 08/2015, no pain or other complains, she also report right breast swelling, no pain, skin erythema, nipple discharge or other complain. She otherwise feels well. She was initially seen at urgent care in March, then was referred to establish her care with prior care physician at [facility] internal medicine center. Her screening mammogram was negative in January 2017. She subsequently underwent CT chest on 09/27/2015 which showed enlarged and inflamed right breast, bulky right axillary lymph nodes. She was referred to breast Center for diagnostic mammogram and ultrasound of right breast, which was negative. She underwent ultrasound guided right axillary node biopsy on 12/05/2015, which reviewed port differentiated carcinoma. ER weakly focally positive.  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 She works independently. She lives with her husband. She denies any fever, night sweats, chills, or skin itchiness.  CURRENT THERAPY: pending third line chemo carboplatin AUC 2 and gemcitabine 10065mm2 weekly, 2 weeks on and one week off, starting on 09/09/16   INTERIM HISTORY:  LiKaziaheturns for follow up. She is doing well today. Her appetite is back since she didn't get chemo this week. Her breast tissue is getting softer. Denies pain. She has some swelling in her feet. There is a color change in her fingers. Denies numbness, tingling, or any other  concerns.   MEDICAL HISTORY:  Past Medical History:  Diagnosis Date  . Arthritis   . GERD (gastroesophageal reflux disease)   . Hypertension   . Shortness of breath   . Sleep apnea   [facility]    SURGICAL HISTORY: Past Surgical History:  Procedure Laterality Date  . IR GENERIC HISTORICAL  06/13/2016   IR [place] VASC ACCESS LEFT 06/13/2016 JaCorrie MckusickDO [facility]INTER[facility]  . IR GENERIC HISTORICAL  06/13/2016   IR FLUORO GUIDE PORT INSERTION LEFT 06/13/2016 JaCorrie MckusickDO [facility]  . NO PAST SURGERIES      SOCIAL HISTORY: Social History   Social History  . Marital status: Married    Spouse name: N/A  . Number of children: 4  . Years of education: 8 TH   Occupational History  . Not on file.   Social History Main Topics  . Smoking status: Former Smoker    Packs/day: 0.50    Years: 1.00    Types: Cigarettes    Start date: 06/17/1968    Quit date: 06/17/1973  . Smokeless tobacco: Never Used     Comment: Quit in 1978s. Alcohol use No     Comment: former drinker for approximately 10 years  . Drug use: No  . Sexual activity: Not on file   Other Topics Concern  . Not on file  Social History Narrative   Patient is married with 3 children still living and 1 deceased son.   Patient is right handed.   Patient has 8 th grade education.   Patient drinks 2-3 servings daily.    FAMILY HISTORY: Family History  Problem Relation Age of Onset  . Heart disease Mother   . Cancer Father 15    throat cancer  . Cancer Cousin     breast cancer    ALLERGIES:  has No Known Allergies.  MEDICATIONS:  Current Outpatient Prescriptions  Medication Sig Dispense Refill  . amLODipine (NORVASC) 10 MG tablet TAKE 1 TABLET EVERY DAY 90 tablet 0  . aspirin EC 81 MG tablet Take 1 tablet (81 mg total) by mouth daily. 90 tablet 3  . atorvastatin (LIPITOR) 20 MG tablet TAKE 1 TABLET EVERY DAY (Patient taking differently: Take 20 mg by mouth daily) 90 tablet 3  . furosemide  (LASIX) 20 MG tablet Take 20 mg by mouth daily.     Marland Kitchen lidocaine-prilocaine (EMLA) cream Apply to port @ 1 hour prior to treatment. 30 g 1  . metoprolol tartrate (LOPRESSOR) 25 MG tablet TAKE 1 TABLET EVERY DAY 90 tablet 3  . ondansetron (ZOFRAN) 8 MG tablet Take by mouth every 8 (eight) hours as needed for nausea or vomiting.    . prochlorperazine (COMPAZINE) 10 MG tablet Take 10 mg by mouth every 6 (six) hours as needed for nausea or vomiting.    . traMADol (ULTRAM) 50 MG tablet Take 1 tablet (50 mg total) by mouth every 8 (eight) hours as needed. 60 tablet 1   No current facility-administered medications for this visit.    Facility-Administered Medications Ordered in Other Visits  Medication Dose Route Frequency Provider Last Rate Last Dose  . sodium chloride flush (NS) 0.9 % injection 10 mL  10 mL Intravenous PRN Truitt Merle, MD   10 mL at 08/12/16 1754       REVIEW OF SYSTEMS: Constitutional: Denies fevers, chills or abnormal night sweats  Eyes: Denies blurriness of vision, double vision or watery eyes Ears, nose, mouth, throat, and face: Denies mucositis or sore throat Respiratory: Denies cough, dyspnea or wheezes Cardiovascular: Denies palpitation, chest discomfort or lower extremity swelling (+) swelling in feet Gastrointestinal:  Denies nausea, heartburn or change in bowel habits Skin: no skin rashes. (+) darkening of skin on hands Lymphatics: Denies new lymphadenopathy or easy bruising Neurological: Negative Behavioral/Psych: Mood is stable, no new changes  All other systems were reviewed with the patient and are negative.  PHYSICAL EXAMINATION: ECOG PERFORMANCE STATUS: 1  Vitals:   09/02/16 1353  BP: (!) 150/76  Pulse: 72  Resp: 18  Temp: 98.1 F (36.7 C)   Filed Weights   09/02/16 1353  Weight: 181 lb (82.1 kg)   GENERAL:alert, no distress and comfortable SKIN: skin color, texture, turgor are normal, no rashes or significant lesions EYES: normal, conjunctiva are  pink and non-injected, sclera clear OROPHARYNX:no exudate, no erythema and lips, buccal mucosa, and tongue normal  NECK: supple, thyroid normal size, non-tender, without nodularity LYMPH:  no palpable lymphadenopathy in the cervical, axillary or inguinal LUNGS: clear to auscultation and percussion with normal breathing effort HEART: regular rate & rhythm and no murmurs and no lower extremity edema ABDOMEN:abdomen soft, non-tender and normal bowel sounds Musculoskeletal:no cyanosis of digits and no clubbing  PSYCH: alert & oriented x 3 with fluent speech NEURO: Decreased vibration sensation on bilateral ankle and wrist. SKIN: (+) Dry skin of the  bilateral feet, no sign of infection. EXTREMITIES: Bilateral hand and lower extremity edema. Breasts: her entire right breast is very firm, with associated skin erythema and edema which appears slightly improved overall. There is a 4 x 3 cm mass at the anterior right axilla, close to right chest wall of breast margin, no tenderness, movable. Left axilla moveable lymph node measuring 1.5 cm.   LABORATORY DATA:  I have reviewed the data as listed CBC Latest Ref Rng & Units 09/02/2016 08/12/2016 07/29/2016  WBC 3.9 - 10.3 10e3/uL 4.1 7.6 8.2  Hemoglobin 11.6 - 15.9 g/dL 9.3(L) 9.3(L) 9.8(L)  Hematocrit 34.8 - 46.6 % 28.4(L) 29.2(L) 29.7(L)  Platelets 145 - 400 10e3/uL 270 194 159   CMP Latest Ref Rng & Units 09/02/2016 08/12/2016 07/29/2016  Glucose 70 - 140 mg/dl 87 95 90  BUN 7.0 - 26.0 mg/dL 23.2 14.5 27.5(H)  Creatinine 0.6 - 1.1 mg/dL 1.0 1.4(H) 1.1  Sodium 136 - 145 mEq/L 143 141 143  Potassium 3.5 - 5.1 mEq/L 3.9 3.8 4.0  Chloride 101 - 111 mmol/L - - -  CO2 22 - 29 mEq/L 24 27 27   Calcium 8.4 - 10.4 mg/dL 9.1 9.1 9.0  Total Protein 6.4 - 8.3 g/dL 7.1 7.3 7.1  Total Bilirubin 0.20 - 1.20 mg/dL 0.29 0.28 0.32  Alkaline Phos 40 - 150 U/L 61 70 69  AST 5 - 34 U/L 11 14 13   ALT 0 - 55 U/L 11 9 8    CA27.29: 01/05/2016: 155.9 02/27/2016:  96.7 03/26/2016: 64.7 04/23/2016: 65.4 05/21/2016: 99.2 07/15/2016: 215.4  08/12/2016: 176.9  PATHOLOGY REPORT: Diagnosis 11/30/2015 Lymph node, needle/core biopsy, right breast/axilla - LYMPH NODE POSITIVE FOR METASTATIC POORLY DIFFERENTIATED CARCINOMA. - SEE COMMENT. Microscopic Comment Immunohistochemical stains are performed. The tumor is positive for cytokeratin AE1/AE3, cytokeratin 7 and E-cadherin. CDX-2 demonstrates weak positivity. Estrogen receptor demonstrates focal very weak to equivocal staining. Gross cystic disease fluid protein, cytokeratin 20, TTF-1, Melan-A and S100 are all negative. The findings are consistent with a poorly differentiated carcinoma. Given the staining pattern, an upper gastrointestinal and pancreatobiliary primary source should be ruled out. In addition, given the axillary location of the biopsy and focal weak to equivocal estrogen receptor staining, ruling out a poorly differentiated carcinoma of breast primary source is also prudent. Lastly, a gynecologic primary may be considered. Correlation with clinical and radiologic impression is essential. Dr. Vicente Males has seen this case in consultation with essential agreement of the above diagnosis and comment. The findings are called to the [facility] on 12/04/15. (RAH:gt, 12/04/15)  Diagnosis 12/27/2015 Breast, right, needle core biopsy, central - INVASIVE MAMMARY CARCINOMA. - MAMMARY CARCINOMA IN SITU. - LYMPH/VASCULAR INVASION IS IDENTIFIED. - SEE COMMENT. Microscopic Comment An E-cadherin stain will be performed to determine if the carcinoma is ductal or lobular in nature. The results of the stain will be reported in an addendum to follow. Although definitive grading of breast carcinoma is best done on excision, the features of the invasive tumor from the right central breast biopsy are compatible with a grade 3 breast carcinoma. Breast prognostic markers will be performed and reported  in an addendum. Findings are called to the [facility] on 12/28/2015. Dr. Lyndon Code has seen this case in consultation with agreement. (RH:kh 12-28-15) ADDENDUM: An E-cadherin immunohistochemical stain is performed which is positive in both the invasive and in situ components confirming the ductal nature of both (i.e. invasive ductal carcinoma with ductal carcinoma in situ). (RAH:gt, 12/29/15) PROGNOSTIC INDICATORS  Results: IMMUNOHISTOCHEMICAL AND MORPHOMETRIC ANALYSIS PERFORMED MANUALLY Estrogen Receptor: 0%, NEGATIVE Progesterone Receptor: 0%, NEGATIVE Proliferation Marker Ki67: 70% Results: HER2 - NEGATIVE RATIO OF HER2/CEP17 SIGNALS 1.13 AVERAGE HER2 COPY NUMBER PER CELL 1.75  Diagnosis 02/07/2016 Lymph node, needle/core biopsy, left axillary - METASTATIC POORLY DIFFERENTIATED CARCINOMA. Results: IMMUNOHISTOCHEMICAL AND MORPHOMETRIC ANALYSIS PERFORMED MANUALLY Estrogen Receptor: 0%, NEGATIVE Progesterone Receptor: 0%, NEGATIVE Results: HER2 - NEGATIVE RATIO OF HER2/CEP17 SIGNALS 1.21 AVERAGE HER2 COPY NUMBER PER CELL 2.05  RADIOGRAPHIC STUDIES: I have personally reviewed the radiological images as listed and agreed with the findings in the report.  PET 08/23/2016 IMPRESSION: 1. Response to therapy. Significant decrease in size and hypermetabolism of right breast mass and bilateral axillary nodal metastasis. Hypermetabolism within a right supraclavicular node has resolved. 2. New soft tissue fullness and hypermetabolism within the lateral left breast, suspicious for either metastatic disease or new primary. Mastitis could look similar. 3. No extrathoracic metastatic disease identified. 4.  Coronary artery atherosclerosis. Aortic atherosclerosis. 5. Diffuse marrow hypermetabolism is likely related to stimulation by chemotherapy. This decreases sensitivity for osseous metastasis.  PET 06/05/2016 IMPRESSION: 1. Progressive right breast mass, expanding in size and  metabolic activity. Enlarging but bilateral axillary adenopathy likewise increased activity. A right supraclavicular lymph node is slightly smaller than previous but has a higher standard uptake value. Overall appearance compatible with progression but no further new metastatic spread compared to 01/03/2016. 2. Coronary, aortic arch, and branch vessel atherosclerotic vascular disease. Aortoiliac atherosclerotic vascular disease. 3. Extensive edema along the anterior chest wall especially both breasts, right greater than left.  ASSESSMENT & PLAN:  71 y.o. female, with past medical history of HTN, GERD, sleep apnea, presented with bulky right axillary adenopathy.  1. Primary cancer of right breast with metastasis to distant lymph nodes, invasive ductal carcinoma and DCIS, ER-/PR-/HER2-,  [id number], stage IV  -I previously reviewed her mammogram, ultrasound and a CT chest, and biopsy pathology findings with patient and her husband in details -I previously discussed her breast MRI, PET scan as this is abiopsy results in great details with patient and her family members  -Her breast MRI findings and breast biopsy confirmed primary breast cancer, triple negative. Unfortunately her cancer has metastasized to left axilla and cervical lymph nodes, which are distant metastasis.  -We previously reviewed the natural history of metastatic triple negative breast cancer, which is very aggressive, and her cancer is incurable at this stage  -Giving the metastatic disease, surgery up front is not indicated, I recommended systemic chemotherapy. -She has been on weekly Taxol for 4-5 month -I previously reviewed her restaging CT scan findings from 04/24/16, which showed slight improvement in the right-sided adenopathy, no other new metastasis. -I previously reviewed her restaging PET scan from 05/2016, which showed significant cancer progression in the right breast, no other sternal metastasis. - I recommend swiitch  her chemotherapy to Adriamycin and Cytoxan, for rapid disease control, to prevent bleeding and infection from the primary breast cancer. -She has had 4 cycles of AC, treatment was postponed multiple times due to compliance issue or scan. Her right breast lesion and axillary have improved clinically. Her right breast pain has resolved now. -I reviewed her restaging PET scan images from 08/23/2016 with patient and her husband in person, she has had good partial response to [facility], posterior the primary right breast mass and bulky adenopathy has improved. -The PET scan reviewed a new lesion in the left breast, possible metastatic lesion versus new primary. We have previously biopsied to the left  axillary lymph node, which was triple negative also. I do not think the biopsy of left breast would change our management, will follow up on next staging scan  -We discussed next line chemo treatment. I recommend her switch to Carboplatin and Gemcitabine, which is an effective regimen in triple negative breast cancer --Chemotherapy consent: Side effects including but does not not limited to, fatigue, nausea, vomiting, diarrhea, hair loss, neuropathy, fluid retention, renal and kidney dysfunction, neutropenic fever, needed for blood transfusion, bleeding, were discussed with patient in great detail. She agrees to proceed. -she is scheduled to start chemotherapy today, but she prefers to postpone to next week. I'll scheduled for her.  2. ANEMIA in neoplastic disease -She developed normocytic mild anemia in the past few months. -Her anemia workup shows a normal folic acid and J15 level, increased ferritin, decreased serum iron, TIBC and transferrin saturation. This is most consistent with anemia of chronic disease, secondary to her CKD and underlying malignancy.  -I previously encouraged her to continue oral iron supplement  3. CKD stage III -Her GFR is in 40-50's  -No lab signs of tumor lysis -Possibly related to her  underlying hypertension and diuretics  -Her Cr has much improved since she stopped her Lasix. -Avoid nephrotoxins, such as NSAIDs and IV CT contrast  4. HTN, GERD, obesity, sleep apnea -She'll continue follow-up with her primary care physician  5. Right breast pain -secondary to breast cancer, much improved lately after chemo  -continue tramadol as needed   6. Goal of care discussion  -We previously discussed the incurable nature of her cancer, and the overall poor prognosis, especially if she does not have good response to chemotherapy or progress on chemo -The patient understands the goal of care is palliative. -I recommend DNR/DNI, she will think about it   PLAN -we'll start third line chemotherapy next week with Carboplatin and Gemcitabine weekly, 2 weeks on and one week off   -Lab, flush, and f/u in 2 weeks.    I spent 20 minutes counseling the patient face to face. The total time spent in the appointment was 25 minutes and more than 50% was on counseling.  This document serves as a record of services personally performed by Truitt Merle, MD. It was created on her behalf by Martinique Casey, a trained medical scribe. The creation of this record is based on the scribe's personal observations and the provider's statements to them. This document has been checked and approved by the attending provider.  I have reviewed the above documentation for accuracy and completeness and I agree with the above.    Truitt Merle, MD 09/02/2016

## 2016-08-29 ENCOUNTER — Encounter: Payer: Self-pay | Admitting: Pharmacist

## 2016-09-02 ENCOUNTER — Ambulatory Visit: Payer: Medicare HMO

## 2016-09-02 ENCOUNTER — Telehealth: Payer: Self-pay | Admitting: Hematology

## 2016-09-02 ENCOUNTER — Other Ambulatory Visit (HOSPITAL_BASED_OUTPATIENT_CLINIC_OR_DEPARTMENT_OTHER): Payer: Medicare HMO

## 2016-09-02 ENCOUNTER — Ambulatory Visit (HOSPITAL_BASED_OUTPATIENT_CLINIC_OR_DEPARTMENT_OTHER): Payer: Medicare HMO | Admitting: Hematology

## 2016-09-02 ENCOUNTER — Encounter: Payer: Self-pay | Admitting: Hematology

## 2016-09-02 VITALS — BP 150/76 | HR 72 | Temp 98.1°F | Resp 18 | Ht 64.0 in | Wt 181.0 lb

## 2016-09-02 DIAGNOSIS — D630 Anemia in neoplastic disease: Secondary | ICD-10-CM | POA: Diagnosis not present

## 2016-09-02 DIAGNOSIS — N183 Chronic kidney disease, stage 3 unspecified: Secondary | ICD-10-CM

## 2016-09-02 DIAGNOSIS — G893 Neoplasm related pain (acute) (chronic): Secondary | ICD-10-CM

## 2016-09-02 DIAGNOSIS — I517 Cardiomegaly: Secondary | ICD-10-CM

## 2016-09-02 DIAGNOSIS — C50811 Malignant neoplasm of overlapping sites of right female breast: Secondary | ICD-10-CM | POA: Diagnosis not present

## 2016-09-02 DIAGNOSIS — C773 Secondary and unspecified malignant neoplasm of axilla and upper limb lymph nodes: Secondary | ICD-10-CM

## 2016-09-02 DIAGNOSIS — I10 Essential (primary) hypertension: Secondary | ICD-10-CM

## 2016-09-02 DIAGNOSIS — E669 Obesity, unspecified: Secondary | ICD-10-CM | POA: Diagnosis not present

## 2016-09-02 DIAGNOSIS — Z171 Estrogen receptor negative status [ER-]: Secondary | ICD-10-CM | POA: Diagnosis not present

## 2016-09-02 DIAGNOSIS — C50911 Malignant neoplasm of unspecified site of right female breast: Secondary | ICD-10-CM

## 2016-09-02 LAB — CBC WITH DIFFERENTIAL/PLATELET
BASO%: 2.6 % — ABNORMAL HIGH (ref 0.0–2.0)
BASOS ABS: 0.1 10*3/uL (ref 0.0–0.1)
EOS ABS: 0.1 10*3/uL (ref 0.0–0.5)
EOS%: 1.2 % (ref 0.0–7.0)
HCT: 28.4 % — ABNORMAL LOW (ref 34.8–46.6)
HEMOGLOBIN: 9.3 g/dL — AB (ref 11.6–15.9)
LYMPH#: 0.4 10*3/uL — AB (ref 0.9–3.3)
LYMPH%: 8.8 % — ABNORMAL LOW (ref 14.0–49.7)
MCH: 29.9 pg (ref 25.1–34.0)
MCHC: 32.7 g/dL (ref 31.5–36.0)
MCV: 91.3 fL (ref 79.5–101.0)
MONO#: 0.9 10*3/uL (ref 0.1–0.9)
MONO%: 21.6 % — ABNORMAL HIGH (ref 0.0–14.0)
NEUT%: 65.8 % (ref 38.4–76.8)
NEUTROS ABS: 2.7 10*3/uL (ref 1.5–6.5)
Platelets: 270 10*3/uL (ref 145–400)
RBC: 3.11 10*6/uL — ABNORMAL LOW (ref 3.70–5.45)
RDW: 21.8 % — AB (ref 11.2–14.5)
WBC: 4.1 10*3/uL (ref 3.9–10.3)

## 2016-09-02 LAB — COMPREHENSIVE METABOLIC PANEL
ALBUMIN: 3.8 g/dL (ref 3.5–5.0)
ALK PHOS: 61 U/L (ref 40–150)
ALT: 11 U/L (ref 0–55)
AST: 11 U/L (ref 5–34)
Anion Gap: 9 mEq/L (ref 3–11)
BILIRUBIN TOTAL: 0.29 mg/dL (ref 0.20–1.20)
BUN: 23.2 mg/dL (ref 7.0–26.0)
CALCIUM: 9.1 mg/dL (ref 8.4–10.4)
CO2: 24 mEq/L (ref 22–29)
Chloride: 111 mEq/L — ABNORMAL HIGH (ref 98–109)
Creatinine: 1 mg/dL (ref 0.6–1.1)
EGFR: 70 mL/min/{1.73_m2} — AB (ref >90)
Glucose: 87 mg/dl (ref 70–140)
POTASSIUM: 3.9 meq/L (ref 3.5–5.1)
Sodium: 143 mEq/L (ref 136–145)
TOTAL PROTEIN: 7.1 g/dL (ref 6.4–8.3)

## 2016-09-02 NOTE — Telephone Encounter (Signed)
Gave patient avs report and appointments for March and April.  °

## 2016-09-09 ENCOUNTER — Other Ambulatory Visit (HOSPITAL_BASED_OUTPATIENT_CLINIC_OR_DEPARTMENT_OTHER): Payer: Medicare HMO

## 2016-09-09 ENCOUNTER — Ambulatory Visit (HOSPITAL_BASED_OUTPATIENT_CLINIC_OR_DEPARTMENT_OTHER): Payer: Medicare HMO

## 2016-09-09 VITALS — BP 132/73 | HR 65 | Temp 98.7°F | Resp 18

## 2016-09-09 DIAGNOSIS — Z5111 Encounter for antineoplastic chemotherapy: Secondary | ICD-10-CM | POA: Diagnosis not present

## 2016-09-09 DIAGNOSIS — C50911 Malignant neoplasm of unspecified site of right female breast: Secondary | ICD-10-CM

## 2016-09-09 DIAGNOSIS — C799 Secondary malignant neoplasm of unspecified site: Secondary | ICD-10-CM | POA: Diagnosis not present

## 2016-09-09 LAB — CBC WITH DIFFERENTIAL/PLATELET
BASO%: 4.6 % — ABNORMAL HIGH (ref 0.0–2.0)
Basophils Absolute: 0.2 10*3/uL — ABNORMAL HIGH (ref 0.0–0.1)
EOS%: 1.8 % (ref 0.0–7.0)
Eosinophils Absolute: 0.1 10*3/uL (ref 0.0–0.5)
HCT: 29.4 % — ABNORMAL LOW (ref 34.8–46.6)
HGB: 9.6 g/dL — ABNORMAL LOW (ref 11.6–15.9)
LYMPH%: 11.9 % — AB (ref 14.0–49.7)
MCH: 29.9 pg (ref 25.1–34.0)
MCHC: 32.8 g/dL (ref 31.5–36.0)
MCV: 91.2 fL (ref 79.5–101.0)
MONO#: 1 10*3/uL — AB (ref 0.1–0.9)
MONO%: 25.4 % — AB (ref 0.0–14.0)
NEUT%: 56.3 % (ref 38.4–76.8)
NEUTROS ABS: 2.2 10*3/uL (ref 1.5–6.5)
PLATELETS: 227 10*3/uL (ref 145–400)
RBC: 3.22 10*6/uL — AB (ref 3.70–5.45)
RDW: 20.5 % — ABNORMAL HIGH (ref 11.2–14.5)
WBC: 3.8 10*3/uL — AB (ref 3.9–10.3)
lymph#: 0.5 10*3/uL — ABNORMAL LOW (ref 0.9–3.3)

## 2016-09-09 LAB — COMPREHENSIVE METABOLIC PANEL
ALT: 10 U/L (ref 0–55)
AST: 11 U/L (ref 5–34)
Albumin: 3.8 g/dL (ref 3.5–5.0)
Alkaline Phosphatase: 62 U/L (ref 40–150)
Anion Gap: 8 mEq/L (ref 3–11)
BILIRUBIN TOTAL: 0.34 mg/dL (ref 0.20–1.20)
BUN: 25 mg/dL (ref 7.0–26.0)
CO2: 25 meq/L (ref 22–29)
CREATININE: 1 mg/dL (ref 0.6–1.1)
Calcium: 9.2 mg/dL (ref 8.4–10.4)
Chloride: 110 mEq/L — ABNORMAL HIGH (ref 98–109)
EGFR: 67 mL/min/{1.73_m2} — ABNORMAL LOW (ref >90)
GLUCOSE: 92 mg/dL (ref 70–140)
Potassium: 4 mEq/L (ref 3.5–5.1)
SODIUM: 142 meq/L (ref 136–145)
TOTAL PROTEIN: 7.1 g/dL (ref 6.4–8.3)

## 2016-09-09 MED ORDER — SODIUM CHLORIDE 0.9 % IV SOLN
1000.0000 mg/m2 | Freq: Once | INTRAVENOUS | Status: AC
  Administered 2016-09-09: 1900 mg via INTRAVENOUS
  Filled 2016-09-09: qty 49.97

## 2016-09-09 MED ORDER — PALONOSETRON HCL INJECTION 0.25 MG/5ML
INTRAVENOUS | Status: AC
  Filled 2016-09-09: qty 5

## 2016-09-09 MED ORDER — SODIUM CHLORIDE 0.9 % IV SOLN
Freq: Once | INTRAVENOUS | Status: AC
  Administered 2016-09-09: 15:00:00 via INTRAVENOUS

## 2016-09-09 MED ORDER — SODIUM CHLORIDE 0.9% FLUSH
10.0000 mL | INTRAVENOUS | Status: DC | PRN
  Administered 2016-09-09: 10 mL
  Filled 2016-09-09: qty 10

## 2016-09-09 MED ORDER — DEXAMETHASONE SODIUM PHOSPHATE 10 MG/ML IJ SOLN
10.0000 mg | Freq: Once | INTRAMUSCULAR | Status: AC
  Administered 2016-09-09: 10 mg via INTRAVENOUS

## 2016-09-09 MED ORDER — PALONOSETRON HCL INJECTION 0.25 MG/5ML
0.2500 mg | Freq: Once | INTRAVENOUS | Status: AC
Start: 2016-09-09 — End: 2016-09-09
  Administered 2016-09-09: 0.25 mg via INTRAVENOUS

## 2016-09-09 MED ORDER — SODIUM CHLORIDE 0.9 % IV SOLN
185.8000 mg | Freq: Once | INTRAVENOUS | Status: AC
  Administered 2016-09-09: 190 mg via INTRAVENOUS
  Filled 2016-09-09: qty 19

## 2016-09-09 MED ORDER — DEXAMETHASONE SODIUM PHOSPHATE 10 MG/ML IJ SOLN
INTRAMUSCULAR | Status: AC
  Filled 2016-09-09: qty 1

## 2016-09-09 MED ORDER — HEPARIN SOD (PORK) LOCK FLUSH 100 UNIT/ML IV SOLN
500.0000 [IU] | Freq: Once | INTRAVENOUS | Status: AC | PRN
  Administered 2016-09-09: 500 [IU]
  Filled 2016-09-09: qty 5

## 2016-09-09 NOTE — Patient Instructions (Addendum)
[place] Discharge Instructions for Patients Receiving Chemotherapy  Today you received the following chemotherapy agents: Gemzar (gemcitabine) and Carpoplatin.  To help prevent nausea and vomiting after your treatment, we encourage you to take your nausea medication as prescribed.   If you develop nausea and vomiting that is not controlled by your nausea medication, call the clinic.   BELOW ARE SYMPTOMS THAT SHOULD BE REPORTED IMMEDIATELY:  *FEVER GREATER THAN 100.5 F  *CHILLS WITH OR WITHOUT FEVER  NAUSEA AND VOMITING THAT IS NOT CONTROLLED WITH YOUR NAUSEA MEDICATION  *UNUSUAL SHORTNESS OF BREATH  *UNUSUAL BRUISING OR BLEEDING  TENDERNESS IN MOUTH AND THROAT WITH OR WITHOUT PRESENCE OF ULCERS  *URINARY PROBLEMS  *BOWEL PROBLEMS  UNUSUAL RASH Items with * indicate a potential emergency and should be followed up as soon as possible.  Feel free to call the clinic you have any questions or concerns. The clinic phone number is [phone].  Please show the [facility] at check-in to the Emergency Department and triage nurse.  Gemcitabine injection What is this medicine? GEMCITABINE (jem SIT a been) is a chemotherapy drug. This medicine is used to treat many types of cancer like breast cancer, lung cancer, pancreatic cancer, and ovarian cancer. This medicine may be used for other purposes; ask your health care provider or pharmacist if you have questions. COMMON BRAND NAME(S): Gemzar What should I tell my health care provider before I take this medicine? They need to know if you have any of these conditions: -blood disorders -infection -kidney disease -liver disease -recent or ongoing radiation therapy -an unusual or allergic reaction to gemcitabine, other chemotherapy, other medicines, foods, dyes, or preservatives -pregnant or trying to get pregnant -breast-feeding How should I use this medicine? This drug is given as an infusion into a vein.  It is administered in a hospital or clinic by a specially trained health care professional. Talk to your pediatrician regarding the use of this medicine in children. Special care may be needed. Overdosage: If you think you have taken too much of this medicine contact a poison control center or emergency room at once. NOTE: This medicine is only for you. Do not share this medicine with others. What if I miss a dose? It is important not to miss your dose. Call your doctor or health care professional if you are unable to keep an appointment. What may interact with this medicine? -medicines to increase blood counts like filgrastim, pegfilgrastim, sargramostim -some other chemotherapy drugs like cisplatin -vaccines Talk to your doctor or health care professional before taking any of these medicines: -acetaminophen -aspirin -ibuprofen -ketoprofen -naproxen This list may not describe all possible interactions. Give your health care provider a list of all the medicines, herbs, non-prescription drugs, or dietary supplements you use. Also tell them if you smoke, drink alcohol, or use illegal drugs. Some items may interact with your medicine. What should I watch for while using this medicine? Visit your doctor for checks on your progress. This drug may make you feel generally unwell. This is not uncommon, as chemotherapy can affect healthy cells as well as cancer cells. Report any side effects. Continue your course of treatment even though you feel ill unless your doctor tells you to stop. In some cases, you may be given additional medicines to help with side effects. Follow all directions for their use. Call your doctor or health care professional for advice if you get a fever, chills or sore throat, or other symptoms of a  cold or flu. Do not treat yourself. This drug decreases your body's ability to fight infections. Try to avoid being around people who are sick. This medicine may increase your risk to  bruise or bleed. Call your doctor or health care professional if you notice any unusual bleeding. Be careful brushing and flossing your teeth or using a toothpick because you may get an infection or bleed more easily. If you have any dental work done, tell your dentist you are receiving this medicine. Avoid taking products that contain aspirin, acetaminophen, ibuprofen, naproxen, or ketoprofen unless instructed by your doctor. These medicines may hide a fever. Women should inform their doctor if they wish to become pregnant or think they might be pregnant. There is a potential for serious side effects to an unborn child. Talk to your health care professional or pharmacist for more information. Do not breast-feed an infant while taking this medicine. What side effects may I notice from receiving this medicine? Side effects that you should report to your doctor or health care professional as soon as possible: -allergic reactions like skin rash, itching or hives, swelling of the face, lips, or tongue -low blood counts - this medicine may decrease the number of white blood cells, red blood cells and platelets. You may be at increased risk for infections and bleeding. -signs of infection - fever or chills, cough, sore throat, pain or difficulty passing urine -signs of decreased platelets or bleeding - bruising, pinpoint red spots on the skin, black, tarry stools, blood in the urine -signs of decreased red blood cells - unusually weak or tired, fainting spells, lightheadedness -breathing problems -chest pain -mouth sores -nausea and vomiting -pain, swelling, redness at site where injected -pain, tingling, numbness in the hands or feet -stomach pain -swelling of ankles, feet, hands -unusual bleeding Side effects that usually do not require medical attention (report to your doctor or health care professional if they continue or are bothersome): -constipation -diarrhea -hair loss -loss of  appetite -stomach upset This list may not describe all possible side effects. Call your doctor for medical advice about side effects. You may report side effects to FDA at 1-800-FDA-1088. Where should I keep my medicine? This drug is given in a hospital or clinic and will not be stored at home. NOTE: This sheet is a summary. It may not cover all possible information. If you have questions about this medicine, talk to your doctor, pharmacist, or health care provider.  2018 [employer]/Gold Standard (2007-10-13 18:45:54)  Carboplatin injection What is this medicine? CARBOPLATIN (KAR boe pla tin) is a chemotherapy drug. It targets fast dividing cells, like cancer cells, and causes these cells to die. This medicine is used to treat ovarian cancer and many other cancers. This medicine may be used for other purposes; ask your health care provider or pharmacist if you have questions. COMMON BRAND NAME(S): Paraplatin What should I tell my health care provider before I take this medicine? They need to know if you have any of these conditions: -blood disorders -hearing problems -kidney disease -recent or ongoing radiation therapy -an unusual or allergic reaction to carboplatin, cisplatin, other chemotherapy, other medicines, foods, dyes, or preservatives -pregnant or trying to get pregnant -breast-feeding How should I use this medicine? This drug is usually given as an infusion into a vein. It is administered in a hospital or clinic by a specially trained health care professional. Talk to your pediatrician regarding the use of this medicine in children. Special care may be needed. Overdosage:  If you think you have taken too much of this medicine contact a poison control center or emergency room at once. NOTE: This medicine is only for you. Do not share this medicine with others. What if I miss a dose? It is important not to miss a dose. Call your doctor or health care professional if you are unable  to keep an appointment. What may interact with this medicine? -medicines for seizures -medicines to increase blood counts like filgrastim, pegfilgrastim, sargramostim -some antibiotics like amikacin, gentamicin, neomycin, streptomycin, tobramycin -vaccines Talk to your doctor or health care professional before taking any of these medicines: -acetaminophen -aspirin -ibuprofen -ketoprofen -naproxen This list may not describe all possible interactions. Give your health care provider a list of all the medicines, herbs, non-prescription drugs, or dietary supplements you use. Also tell them if you smoke, drink alcohol, or use illegal drugs. Some items may interact with your medicine. What should I watch for while using this medicine? Your condition will be monitored carefully while you are receiving this medicine. You will need important blood work done while you are taking this medicine. This drug may make you feel generally unwell. This is not uncommon, as chemotherapy can affect healthy cells as well as cancer cells. Report any side effects. Continue your course of treatment even though you feel ill unless your doctor tells you to stop. In some cases, you may be given additional medicines to help with side effects. Follow all directions for their use. Call your doctor or health care professional for advice if you get a fever, chills or sore throat, or other symptoms of a cold or flu. Do not treat yourself. This drug decreases your body's ability to fight infections. Try to avoid being around people who are sick. This medicine may increase your risk to bruise or bleed. Call your doctor or health care professional if you notice any unusual bleeding. Be careful brushing and flossing your teeth or using a toothpick because you may get an infection or bleed more easily. If you have any dental work done, tell your dentist you are receiving this medicine. Avoid taking products that contain aspirin,  acetaminophen, ibuprofen, naproxen, or ketoprofen unless instructed by your doctor. These medicines may hide a fever. Do not become pregnant while taking this medicine. Women should inform their doctor if they wish to become pregnant or think they might be pregnant. There is a potential for serious side effects to an unborn child. Talk to your health care professional or pharmacist for more information. Do not breast-feed an infant while taking this medicine. What side effects may I notice from receiving this medicine? Side effects that you should report to your doctor or health care professional as soon as possible: -allergic reactions like skin rash, itching or hives, swelling of the face, lips, or tongue -signs of infection - fever or chills, cough, sore throat, pain or difficulty passing urine -signs of decreased platelets or bleeding - bruising, pinpoint red spots on the skin, black, tarry stools, nosebleeds -signs of decreased red blood cells - unusually weak or tired, fainting spells, lightheadedness -breathing problems -changes in hearing -changes in vision -chest pain -high blood pressure -low blood counts - This drug may decrease the number of white blood cells, red blood cells and platelets. You may be at increased risk for infections and bleeding. -nausea and vomiting -pain, swelling, redness or irritation at the injection site -pain, tingling, numbness in the hands or feet -problems with balance, talking, walking -trouble passing  urine or change in the amount of urine Side effects that usually do not require medical attention (report to your doctor or health care professional if they continue or are bothersome): -hair loss -loss of appetite -metallic taste in the mouth or changes in taste This list may not describe all possible side effects. Call your doctor for medical advice about side effects. You may report side effects to FDA at 1-800-FDA-1088. Where should I keep my  medicine? This drug is given in a hospital or clinic and will not be stored at home. NOTE: This sheet is a summary. It may not cover all possible information. If you have questions about this medicine, talk to your doctor, pharmacist, or health care provider.  2018 [employer]vier/Gold Standard (2007-09-08 14:38:05)

## 2016-09-10 ENCOUNTER — Telehealth: Payer: Self-pay

## 2016-09-10 LAB — CANCER ANTIGEN 27.29: CA 27.29: 112 U/mL — ABNORMAL HIGH (ref 0.0–38.6)

## 2016-09-10 NOTE — Telephone Encounter (Signed)
-----   Message from Ma Hillock, RN sent at 09/09/2016  4:20 PM EDT ----- Regarding: [name]: 1st Chemo F/U Patient received first dose of gemzar and carboplatin. No immediate complications or concerns. Please call the patient to follow-up.

## 2016-09-10 NOTE — Telephone Encounter (Signed)
No nausea yet, spitting a lot but not vomiting. Eat and drinking OK, moving bowels well. "it's a lot easier than the other chemo". Instructed to call if any problems arise.

## 2016-09-16 ENCOUNTER — Ambulatory Visit (HOSPITAL_BASED_OUTPATIENT_CLINIC_OR_DEPARTMENT_OTHER): Payer: Medicare HMO

## 2016-09-16 ENCOUNTER — Ambulatory Visit (HOSPITAL_BASED_OUTPATIENT_CLINIC_OR_DEPARTMENT_OTHER): Payer: Medicare HMO | Admitting: Oncology

## 2016-09-16 ENCOUNTER — Encounter: Payer: Self-pay | Admitting: Oncology

## 2016-09-16 ENCOUNTER — Ambulatory Visit: Payer: Medicare HMO

## 2016-09-16 ENCOUNTER — Other Ambulatory Visit (HOSPITAL_BASED_OUTPATIENT_CLINIC_OR_DEPARTMENT_OTHER): Payer: Medicare HMO

## 2016-09-16 ENCOUNTER — Telehealth: Payer: Self-pay | Admitting: Hematology

## 2016-09-16 VITALS — BP 151/72 | HR 77 | Temp 98.4°F | Resp 18 | Wt 178.7 lb

## 2016-09-16 DIAGNOSIS — C50911 Malignant neoplasm of unspecified site of right female breast: Secondary | ICD-10-CM | POA: Diagnosis not present

## 2016-09-16 DIAGNOSIS — I10 Essential (primary) hypertension: Secondary | ICD-10-CM | POA: Diagnosis not present

## 2016-09-16 DIAGNOSIS — D630 Anemia in neoplastic disease: Secondary | ICD-10-CM | POA: Diagnosis not present

## 2016-09-16 DIAGNOSIS — Z171 Estrogen receptor negative status [ER-]: Secondary | ICD-10-CM | POA: Diagnosis not present

## 2016-09-16 DIAGNOSIS — Z5111 Encounter for antineoplastic chemotherapy: Secondary | ICD-10-CM | POA: Diagnosis not present

## 2016-09-16 DIAGNOSIS — C773 Secondary and unspecified malignant neoplasm of axilla and upper limb lymph nodes: Secondary | ICD-10-CM

## 2016-09-16 DIAGNOSIS — N183 Chronic kidney disease, stage 3 (moderate): Secondary | ICD-10-CM | POA: Diagnosis not present

## 2016-09-16 LAB — CBC WITH DIFFERENTIAL/PLATELET
BASO%: 3.5 % — ABNORMAL HIGH (ref 0.0–2.0)
BASOS ABS: 0.1 10*3/uL (ref 0.0–0.1)
EOS ABS: 0.1 10*3/uL (ref 0.0–0.5)
EOS%: 4.5 % (ref 0.0–7.0)
HCT: 28 % — ABNORMAL LOW (ref 34.8–46.6)
HEMOGLOBIN: 9.1 g/dL — AB (ref 11.6–15.9)
LYMPH#: 0.3 10*3/uL — AB (ref 0.9–3.3)
LYMPH%: 16.6 % (ref 14.0–49.7)
MCH: 30.1 pg (ref 25.1–34.0)
MCHC: 32.5 g/dL (ref 31.5–36.0)
MCV: 92.7 fL (ref 79.5–101.0)
MONO#: 0.1 10*3/uL (ref 0.1–0.9)
MONO%: 5.5 % (ref 0.0–14.0)
NEUT%: 69.9 % (ref 38.4–76.8)
NEUTROS ABS: 1.4 10*3/uL — AB (ref 1.5–6.5)
Platelets: 153 10*3/uL (ref 145–400)
RBC: 3.02 10*6/uL — ABNORMAL LOW (ref 3.70–5.45)
RDW: 16.8 % — AB (ref 11.2–14.5)
WBC: 2 10*3/uL — AB (ref 3.9–10.3)

## 2016-09-16 LAB — COMPREHENSIVE METABOLIC PANEL
ALBUMIN: 3.9 g/dL (ref 3.5–5.0)
ALK PHOS: 61 U/L (ref 40–150)
ALT: 16 U/L (ref 0–55)
AST: 13 U/L (ref 5–34)
Anion Gap: 9 mEq/L (ref 3–11)
BILIRUBIN TOTAL: 0.26 mg/dL (ref 0.20–1.20)
BUN: 24.1 mg/dL (ref 7.0–26.0)
CALCIUM: 9 mg/dL (ref 8.4–10.4)
CO2: 22 mEq/L (ref 22–29)
Chloride: 109 mEq/L (ref 98–109)
Creatinine: 1 mg/dL (ref 0.6–1.1)
EGFR: 70 mL/min/{1.73_m2} — ABNORMAL LOW (ref >90)
GLUCOSE: 75 mg/dL (ref 70–140)
POTASSIUM: 4 meq/L (ref 3.5–5.1)
Sodium: 140 mEq/L (ref 136–145)
TOTAL PROTEIN: 7.4 g/dL (ref 6.4–8.3)

## 2016-09-16 MED ORDER — SODIUM CHLORIDE 0.9 % IV SOLN
1000.0000 mg/m2 | Freq: Once | INTRAVENOUS | Status: AC
  Administered 2016-09-16: 1900 mg via INTRAVENOUS
  Filled 2016-09-16: qty 49.97

## 2016-09-16 MED ORDER — HEPARIN SOD (PORK) LOCK FLUSH 100 UNIT/ML IV SOLN
500.0000 [IU] | Freq: Once | INTRAVENOUS | Status: AC | PRN
  Administered 2016-09-16: 500 [IU]
  Filled 2016-09-16: qty 5

## 2016-09-16 MED ORDER — DEXAMETHASONE SODIUM PHOSPHATE 10 MG/ML IJ SOLN
10.0000 mg | Freq: Once | INTRAMUSCULAR | Status: AC
  Administered 2016-09-16: 10 mg via INTRAVENOUS

## 2016-09-16 MED ORDER — SODIUM CHLORIDE 0.9 % IV SOLN
185.8000 mg | Freq: Once | INTRAVENOUS | Status: AC
  Administered 2016-09-16: 190 mg via INTRAVENOUS
  Filled 2016-09-16: qty 19

## 2016-09-16 MED ORDER — PALONOSETRON HCL INJECTION 0.25 MG/5ML
0.2500 mg | Freq: Once | INTRAVENOUS | Status: AC
  Administered 2016-09-16: 0.25 mg via INTRAVENOUS

## 2016-09-16 MED ORDER — SODIUM CHLORIDE 0.9 % IV SOLN
Freq: Once | INTRAVENOUS | Status: AC
  Administered 2016-09-16: 10:00:00 via INTRAVENOUS

## 2016-09-16 MED ORDER — PALONOSETRON HCL INJECTION 0.25 MG/5ML
INTRAVENOUS | Status: AC
  Filled 2016-09-16: qty 5

## 2016-09-16 MED ORDER — SODIUM CHLORIDE 0.9% FLUSH
10.0000 mL | INTRAVENOUS | Status: DC | PRN
  Administered 2016-09-16: 10 mL
  Filled 2016-09-16: qty 10

## 2016-09-16 MED ORDER — DEXAMETHASONE SODIUM PHOSPHATE 10 MG/ML IJ SOLN
INTRAMUSCULAR | Status: AC
  Filled 2016-09-16: qty 1

## 2016-09-16 NOTE — Patient Instructions (Addendum)
[facility] for [facility] Receiving [facility]otherapy  Today you received the following chemotherapy agents: Gemzar and Carboplatin   To help prevent nausea and vomiting after your treatment, we encourage you to take your nausea medication as directed.    If you develop nausea and vomiting that is not controlled by your nausea medication, call the clinic.   BELOW ARE SYMPTOMS THAT SHOULD BE REPORTED IMMEDIATELY:  *FEVER GREATER THAN 100.5 F  *CHILLS WITH OR WITHOUT FEVER  NAUSEA AND VOMITING THAT IS NOT CONTROLLED WITH YOUR NAUSEA MEDICATION  *UNUSUAL SHORTNESS OF BREATH  *UNUSUAL BRUISING OR BLEEDING  TENDERNESS IN MOUTH AND THROAT WITH OR WITHOUT PRESENCE OF ULCERS  *URINARY PROBLEMS  *BOWEL PROBLEMS  UNUSUAL RASH Items with * indicate a potential emergency and should be followed up as soon as possible.  Feel free to call the clinic you have any questions or concerns. The clinic phone number is [phone].  Please show the CHEMO ALERT CARD at check-in to the Emergency Department and triage nurse.   

## 2016-09-16 NOTE — Progress Notes (Signed)
[place]  [phone] [phone]  Clinic Follow Up Note   Patient Care Team: [name], MD as PCP - General (Internal Medicine) 09/16/2016   CHIEF COMPLAINTS:  Follow up right breast cancer   Oncology History   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 [facility])   Staging form: Breast, AJCC 7th Edition   - Clinical stage from 11/30/2015: Stage IV (T4d, N3c, M1) - Signed by Truitt Merle, MD on 01/05/2016      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 [facility])   11/30/2015 Initial Diagnosis    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 [facility])      11/30/2015 Initial Biopsy    Right axilla lymph node biopsy showed metastatic poorly differentiated adenocarcinoma, IHC positive for CK AE1/AE3, CK7, E-cadherin, CDX-2, and focally very weak to equivocal staining for ER      12/21/2015 Imaging    Bilateral breast MRI showed extensive non-mass enhancement involving the entire right breast, right axillary, supraclavicular, and internal mammary adenopathy, new left axillary adenopathy      12/27/2015 Mammogram    Diagnostic mammogram and ultrasound of right breast and axilla showed diffuse skin thickening and increased density, no focal mass, numerous enlarged right axillary lymph node, the largest measuring 5.3cm.       12/27/2015 Initial Biopsy    Right breast core needle biopsy showed invasive ductal carcinoma, and DCIS, lymphovascular invasion identified      12/27/2015 Receptors her2    ER negative, PR negative, Ki-67 70%, HER-2 negative      01/03/2016 Imaging    PET scan showed diffuse asymmetric right breast hypermetabolic soft tissue density and skin thickening, hypermetabolic metastatic lymphadenopathy in bilateral axillary and subpectoral regions, right supraclavicular region and the right cervical level 5      01/10/2016 - 05/28/2016 Chemotherapy    Weekly Taxol 80 mg/m, second cycle was  postponed to 8/22 due to pt's non-compliance, stopped due to disease progression. She developed mild peripheral neuropathy in early Dec 2017.      02/07/2016 Pathology Results    Left axillary lymph node biopsy showed metastatic poorly differentiated carcinoma, triple negative.      04/24/2016 Imaging    CT CHEST, ABDOMEN AND PELVIS WITH CONTRAST IMPRESSION: 1. Bulky right axillary and mild right retropectoral and right supraclavicular lymphadenopathy is mildly decreased since 01/03/2016 PET-CT. 2. Left axillary lymphadenopathy is mildly increased. 3. Diffuse asymmetric thickening of the skin and fibroglandular soft tissues of the right breast is not appreciably changed. 4. No definite additional sites of metastatic disease in the chest, abdomen or pelvis. 5. Subcentimeter right liver lobe lesion is too small to characterize and is probably stable. 6. Additional findings include aortic atherosclerosis, 1 vessel coronary atherosclerosis, tiny hiatal hernia and myomatous uterus.      06/05/2016 Imaging    PET scan showed progressive right breast mass, extending in size and metabolic activity. Enlarging bilateral axillary adenopathy likewise in increased activity. A right supraclavicular lymph node is slightly smaller than before, no other new metastasis.      08/23/2016 PET scan    1. Response to therapy. Significant decrease in size and hypermetabolism of right breast mass and bilateral axillary nodal metastasis. Hypermetabolism within a right supraclavicular node has resolved. 2. New soft tissue fullness and hypermetabolism within the lateral left breast, suspicious for either metastatic disease or new primary. Mastitis could look similar. 3. No extrathoracic metastatic disease  identified. 4.  Coronary artery atherosclerosis. Aortic atherosclerosis. 5. Diffuse marrow hypermetabolism is likely related to stimulation by chemotherapy. This decreases sensitivity for osseous metastasis.         HISTORY OF PRESENTING ILLNESS (12/14/2015) :  [name] 71 y.o. 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 She is accompanied by her husband to the clinic today.  She noticed a lump at right axilla in 08/2015, no pain or other complains, she also report right breast swelling, no pain, skin erythema, nipple discharge or other complain. She otherwise feels well. She was initially seen at urgent care in March, then was referred to establish her care with prior care physician at [facility] internal medicine center. Her screening mammogram was negative in January 2017. She subsequently underwent CT chest on 09/27/2015 which showed enlarged and inflamed right breast, bulky right axillary lymph nodes. She was referred to breast Center for diagnostic mammogram and ultrasound of right breast, which was negative. She underwent ultrasound guided right axillary node biopsy on 12/05/2015, which reviewed port differentiated carcinoma. ER weakly focally positive.  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 She works independently. She lives with her husband. She denies any fever, night sweats, chills, or skin itchiness.  CURRENT THERAPY: pending third line chemo carboplatin AUC 2 and gemcitabine 10016mm2 weekly, 2 weeks on and one week off, starting on 09/09/16   INTERIM HISTORY:  LiZelmaeturns for follow up. She is doing well today. Her appetite is good. Her breast tissue is getting softer. Denies pain. She has some swelling in her feet. There is a color change in her fingers. Denies numbness, tingling, or any other concerns.   MEDICAL HISTORY:  Past Medical  History:  Diagnosis Date  . Arthritis   . GERD (gastroesophageal reflux disease)   . Hypertension   . Shortness of breath   . Sleep apnea   . [facility]    SURGICAL HISTORY: Past Surgical History:  Procedure Laterality Date  . IR GENERIC HISTORICAL  06/13/2016   IR [place] VASC ACCESS LEFT 06/13/2016 JaCorrie MckusickDO [facility]INTER[facility]  . IR GENERIC HISTORICAL  06/13/2016   IR FLUORO GUIDE PORT INSERTION LEFT 06/13/2016 JaCorrie MckusickDO [facility]INTER[facility]  . NO PAST SURGERIES      SOCIAL HISTORY: Social History   Social History  . Marital status: Married    Spouse name: N/A  . Number of children: 4  . Years of education: 8 TH   Occupational History  . Not on file.   Social History Main Topics  . Smoking status: Former Smoker    Packs/day: 0.50    Years: 1.00    Types: Cigarettes    Start date: 06/17/1968    Quit date: 06/17/1973  . Smokeless tobacco: Never Used     Comment: Quit in 1950s. Alcohol use No     Comment: former drinker for approximately 10 years  . Drug use: No  . Sexual activity: Not on file   Other Topics Concern  . Not on file   Social History Narrative   Patient is  married with 3 children still living and 1 deceased son.   Patient is right handed.   Patient has 8 th grade education.   Patient drinks 2-3 servings daily.    FAMILY HISTORY: Family History  Problem Relation Age of Onset  . Heart disease Mother   . Cancer Father 9    throat cancer  . Cancer Cousin     breast cancer    ALLERGIES:  has No Known Allergies.  MEDICATIONS:  Current Outpatient Prescriptions  Medication Sig Dispense Refill  . amLODipine (NORVASC) 10 MG tablet TAKE 1 TABLET EVERY DAY 90 tablet 0  . aspirin EC 81 MG tablet Take 1 tablet (81 mg total) by mouth daily. 90 tablet 3  . atorvastatin (LIPITOR) 20 MG tablet TAKE 1 TABLET EVERY DAY (Patient taking differently: Take 20 mg by mouth daily) 90 tablet 3  . furosemide (LASIX) 20 MG tablet Take 20 mg by mouth  daily.     Marland Kitchen lidocaine-prilocaine (EMLA) cream Apply to port @ 1 hour prior to treatment. 30 g 1  . metoprolol tartrate (LOPRESSOR) 25 MG tablet TAKE 1 TABLET EVERY DAY 90 tablet 3  . ondansetron (ZOFRAN) 8 MG tablet Take by mouth every 8 (eight) hours as needed for nausea or vomiting.    . prochlorperazine (COMPAZINE) 10 MG tablet Take 10 mg by mouth every 6 (six) hours as needed for nausea or vomiting.    . traMADol (ULTRAM) 50 MG tablet Take 1 tablet (50 mg total) by mouth every 8 (eight) hours as needed. 60 tablet 1   No current facility-administered medications for this visit.    Facility-Administered Medications Ordered in Other Visits  Medication Dose Route Frequency Provider Last Rate Last Dose  . sodium chloride flush (NS) 0.9 % injection 10 mL  10 mL Intravenous PRN Truitt Merle, MD   10 mL at 08/12/16 1754       REVIEW OF SYSTEMS: Constitutional: Denies fevers, chills or abnormal night sweats  Eyes: Denies blurriness of vision, double vision or watery eyes Ears, nose, mouth, throat, and face: Denies mucositis or sore throat Respiratory: Denies cough, dyspnea or wheezes Cardiovascular: Denies palpitation, chest discomfort or lower extremity swelling (+) swelling in feet Gastrointestinal:  Denies nausea, heartburn or change in bowel habits Skin: no skin rashes. (+) darkening of skin on hands Lymphatics: Denies new lymphadenopathy or easy bruising Neurological: Negative Behavioral/Psych: Mood is stable, no new changes  All other systems were reviewed with the patient and are negative.  PHYSICAL EXAMINATION: ECOG PERFORMANCE STATUS: 1  Vitals:   09/16/16 0935  BP: (!) 151/72  Pulse: 77  Resp: 18  Temp: 98.4 F (36.9 C)   Filed Weights   09/16/16 0935  Weight: 178 lb 11.2 oz (81.1 kg)   GENERAL:alert, no distress and comfortable SKIN: skin color, texture, turgor are normal, no rashes or significant lesions EYES: normal, conjunctiva are pink and non-injected, sclera  clear OROPHARYNX:no exudate, no erythema and lips, buccal mucosa, and tongue normal  NECK: supple, thyroid normal size, non-tender, without nodularity LYMPH:  no palpable lymphadenopathy in the cervical, axillary or inguinal LUNGS: clear to auscultation and percussion with normal breathing effort HEART: regular rate & rhythm and no murmurs and no lower extremity edema ABDOMEN:abdomen soft, non-tender and normal bowel sounds Musculoskeletal:no cyanosis of digits and no clubbing  PSYCH: alert & oriented x 3 with fluent speech NEURO: Decreased vibration sensation on bilateral ankle and wrist. SKIN: (+) Dry skin of the bilateral feet, no sign of  infection. EXTREMITIES: Bilateral hand and lower extremity edema. Breasts: her entire right breast is very firm, with associated skin erythema and edema which appears slightly improved overall. There is a 4 x 3 cm mass at the anterior right axilla, close to right chest wall of breast margin, no tenderness, movable. Left axilla moveable lymph node measuring 1.5 cm.   LABORATORY DATA:  I have reviewed the data as listed CBC Latest Ref Rng & Units 09/16/2016 09/09/2016 09/02/2016  WBC 3.9 - 10.3 10e3/uL 2.0(L) 3.8(L) 4.1  Hemoglobin 11.6 - 15.9 g/dL 9.1(L) 9.6(L) 9.3(L)  Hematocrit 34.8 - 46.6 % 28.0(L) 29.4(L) 28.4(L)  Platelets 145 - 400 10e3/uL 153 227 270   CMP Latest Ref Rng & Units 09/16/2016 09/09/2016 09/02/2016  Glucose 70 - 140 mg/dl 75 92 87  BUN 7.0 - 26.0 mg/dL 24.1 25.0 23.2  Creatinine 0.6 - 1.1 mg/dL 1.0 1.0 1.0  Sodium 136 - 145 mEq/L 140 142 143  Potassium 3.5 - 5.1 mEq/L 4.0 4.0 3.9  Chloride 101 - 111 mmol/L - - -  CO2 22 - 29 mEq/L 22 25 24   Calcium 8.4 - 10.4 mg/dL 9.0 9.2 9.1  Total Protein 6.4 - 8.3 g/dL 7.4 7.1 7.1  Total Bilirubin 0.20 - 1.20 mg/dL 0.26 0.34 0.29  Alkaline Phos 40 - 150 U/L 61 62 61  AST 5 - 34 U/L 13 11 11   ALT 0 - 55 U/L 16 10 11    CA27.29: 01/05/2016: 155.9 02/27/2016: 96.7 03/26/2016: 64.7 04/23/2016:  65.4 05/21/2016: 99.2 07/15/2016: 215.4  08/12/2016: 176.9  PATHOLOGY REPORT: Diagnosis 11/30/2015 Lymph node, needle/core biopsy, right breast/axilla - LYMPH NODE POSITIVE FOR METASTATIC POORLY DIFFERENTIATED CARCINOMA. - SEE COMMENT. Microscopic Comment Immunohistochemical stains are performed. The tumor is positive for cytokeratin AE1/AE3, cytokeratin 7 and E-cadherin. CDX-2 demonstrates weak positivity. Estrogen receptor demonstrates focal very weak to equivocal staining. Gross cystic disease fluid protein, cytokeratin 20, TTF-1, Melan-A and S100 are all negative. The findings are consistent with a poorly differentiated carcinoma. Given the staining pattern, an upper gastrointestinal and pancreatobiliary primary source should be ruled out. In addition, given the axillary location of the biopsy and focal weak to equivocal estrogen receptor staining, ruling out a poorly differentiated carcinoma of breast primary source is also prudent. Lastly, a gynecologic primary may be considered. Correlation with clinical and radiologic impression is essential. Dr. Vicente Males has seen this case in consultation with essential agreement of the above diagnosis and comment. The findings are called to the [place] on 12/04/15. (RAH:gt, 12/04/15)  Diagnosis 12/27/2015 Breast, right, needle core biopsy, central - INVASIVE MAMMARY CARCINOMA. - MAMMARY CARCINOMA IN SITU. - LYMPH/VASCULAR INVASION IS IDENTIFIED. - SEE COMMENT. Microscopic Comment An E-cadherin stain will be performed to determine if the carcinoma is ductal or lobular in nature. The results of the stain will be reported in an addendum to follow. Although definitive grading of breast carcinoma is best done on excision, the features of the invasive tumor from the right central breast biopsy are compatible with a grade 3 breast carcinoma. Breast prognostic markers will be performed and reported in an addendum. Findings are called  to the [place] on 12/28/2015. Dr. Lyndon Code has seen this case in consultation with agreement. (RH:kh 12-28-15) ADDENDUM: An E-cadherin immunohistochemical stain is performed which is positive in both the invasive and in situ components confirming the ductal nature of both (i.e. invasive ductal carcinoma with ductal carcinoma in situ). (RAH:gt, 12/29/15) PROGNOSTIC INDICATORS Results: IMMUNOHISTOCHEMICAL AND MORPHOMETRIC ANALYSIS  PERFORMED MANUALLY Estrogen Receptor: 0%, NEGATIVE Progesterone Receptor: 0%, NEGATIVE Proliferation Marker Ki67: 70% Results: HER2 - NEGATIVE RATIO OF HER2/CEP17 SIGNALS 1.13 AVERAGE HER2 COPY NUMBER PER CELL 1.75  Diagnosis 02/07/2016 Lymph node, needle/core biopsy, left axillary - METASTATIC POORLY DIFFERENTIATED CARCINOMA. Results: IMMUNOHISTOCHEMICAL AND MORPHOMETRIC ANALYSIS PERFORMED MANUALLY Estrogen Receptor: 0%, NEGATIVE Progesterone Receptor: 0%, NEGATIVE Results: HER2 - NEGATIVE RATIO OF HER2/CEP17 SIGNALS 1.21 AVERAGE HER2 COPY NUMBER PER CELL 2.05  RADIOGRAPHIC STUDIES: I have personally reviewed the radiological images as listed and agreed with the findings in the report.  PET 08/23/2016 IMPRESSION: 1. Response to therapy. Significant decrease in size and hypermetabolism of right breast mass and bilateral axillary nodal metastasis. Hypermetabolism within a right supraclavicular node has resolved. 2. New soft tissue fullness and hypermetabolism within the lateral left breast, suspicious for either metastatic disease or new primary. Mastitis could look similar. 3. No extrathoracic metastatic disease identified. 4.  Coronary artery atherosclerosis. Aortic atherosclerosis. 5. Diffuse marrow hypermetabolism is likely related to stimulation by chemotherapy. This decreases sensitivity for osseous metastasis.  PET 06/05/2016 IMPRESSION: 1. Progressive right breast mass, expanding in size and metabolic activity. Enlarging but  bilateral axillary adenopathy likewise increased activity. A right supraclavicular lymph node is slightly smaller than previous but has a higher standard uptake value. Overall appearance compatible with progression but no further new metastatic spread compared to 01/03/2016. 2. Coronary, aortic arch, and branch vessel atherosclerotic vascular disease. Aortoiliac atherosclerotic vascular disease. 3. Extensive edema along the anterior chest wall especially both breasts, right greater than left.  ASSESSMENT & PLAN:  71 y.o. female, with past medical history of HTN, GERD, sleep apnea, presented with bulky right axillary adenopathy.  1. Primary cancer of right breast with metastasis to distant lymph nodes, invasive ductal carcinoma and DCIS, ER-/PR-/HER2-,  [id number], stage IV  -I previously reviewed her mammogram, ultrasound and a CT chest, and biopsy pathology findings with patient and her husband in details -I previously discussed her breast MRI, PET scan as this is abiopsy results in great details with patient and her family members  -Her breast MRI findings and breast biopsy confirmed primary breast cancer, triple negative. Unfortunately her cancer has metastasized to left axilla and cervical lymph nodes, which are distant metastasis.  -We previously reviewed the natural history of metastatic triple negative breast cancer, which is very aggressive, and her cancer is incurable at this stage  -Giving the metastatic disease, surgery up front is not indicated, I recommended systemic chemotherapy. -She has been on weekly Taxol for 4-5 month -I previously reviewed her restaging CT scan findings from 04/24/16, which showed slight improvement in the right-sided adenopathy, no other new metastasis. -I previously reviewed her restaging PET scan from 05/2016, which showed significant cancer progression in the right breast, no other sternal metastasis. - I recommend swiitch her chemotherapy to Adriamycin and  Cytoxan, for rapid disease control, to prevent bleeding and infection from the primary breast cancer. -She has had 4 cycles of AC, treatment was postponed multiple times due to compliance issue or scan. Her right breast lesion and axillary have improved clinically. Her right breast pain has resolved now. -I reviewed her restaging PET scan images from 08/23/2016 with patient and her husband in person, she has had good partial response to [facility], posterior the primary right breast mass and bulky adenopathy has improved. -The PET scan reviewed a new lesion in the left breast, possible metastatic lesion versus new primary. We have previously biopsied to the left axillary lymph node, which was  triple negative also. I do not think the biopsy of left breast would change our management, will follow up on next staging scan  -We discussed next line chemo treatment. I recommend her switch to Carboplatin and Gemcitabine, which is an effective regimen in triple negative breast cancer --Chemotherapy consent: Side effects including but does not not limited to, fatigue, nausea, vomiting, diarrhea, hair loss, neuropathy, fluid retention, renal and kidney dysfunction, neutropenic fever, needed for blood transfusion, bleeding, were discussed with patient in great detail. She agrees to proceed. -Labs reviewed with Dr. Benay Spice and Dr. Ernestina Penna absence. ANC today is 1.4 and we will proceed with her chemotherapy as scheduled. -The patient will return next week for CBC. She may need dose modification of her chemotherapy at some point in the future.  2. ANEMIA in neoplastic disease -She developed normocytic mild anemia in the past few months. -Her anemia workup shows a normal folic acid and E03 level, increased ferritin, decreased serum iron, TIBC and transferrin saturation. This is most consistent with anemia of chronic disease, secondary to her CKD and underlying malignancy.  -I previously encouraged her to continue oral iron  supplement  3. CKD stage III -Her GFR is in 40-50's  -No lab signs of tumor lysis -Possibly related to her underlying hypertension and diuretics  -Her Cr has much improved since she stopped her Lasix. -Avoid nephrotoxins, such as NSAIDs and IV CT contrast  4. HTN, GERD, obesity, sleep apnea -She'll continue follow-up with her primary care physician  5. Right breast pain -secondary to breast cancer, much improved lately after chemo  -continue tramadol as needed   6. Goal of care discussion  -We previously discussed the incurable nature of her cancer, and the overall poor prognosis, especially if she does not have good response to chemotherapy or progress on chemo -The patient understands the goal of care is palliative. -I recommend DNR/DNI, she will think about it   PLAN -Proceed with cycle 1 day 8 of Carboplatin and Gemcitabine weekly, 2 weeks on and one week off -CBC in 1 week   -Lab, flush, and f/u in 2 weeks.    I spent 20 minutes counseling the patient face to face. The total time spent in the appointment was 25 minutes and more than 50% was on counseling.     Mikey Bussing, NP 09/16/2016

## 2016-09-16 NOTE — Progress Notes (Signed)
Per Dr. Benay Spice and Mikey Bussing, NP, it's OK to treat with today's labs. Charge nurse in the infusion room notified.

## 2016-09-16 NOTE — Telephone Encounter (Signed)
Gave patient AVS and calender per 09/16/2016 los.  

## 2016-09-23 ENCOUNTER — Encounter: Payer: Self-pay | Admitting: *Deleted

## 2016-09-23 ENCOUNTER — Ambulatory Visit (HOSPITAL_BASED_OUTPATIENT_CLINIC_OR_DEPARTMENT_OTHER): Payer: Medicare HMO

## 2016-09-23 ENCOUNTER — Telehealth: Payer: Self-pay | Admitting: *Deleted

## 2016-09-23 ENCOUNTER — Other Ambulatory Visit (HOSPITAL_BASED_OUTPATIENT_CLINIC_OR_DEPARTMENT_OTHER): Payer: Medicare HMO

## 2016-09-23 VITALS — BP 155/79 | HR 71 | Temp 98.4°F | Resp 18

## 2016-09-23 DIAGNOSIS — C50911 Malignant neoplasm of unspecified site of right female breast: Secondary | ICD-10-CM | POA: Diagnosis not present

## 2016-09-23 DIAGNOSIS — Z452 Encounter for adjustment and management of vascular access device: Secondary | ICD-10-CM | POA: Diagnosis not present

## 2016-09-23 DIAGNOSIS — Z95828 Presence of other vascular implants and grafts: Secondary | ICD-10-CM

## 2016-09-23 LAB — CBC WITH DIFFERENTIAL/PLATELET
BASO%: 2.8 % — ABNORMAL HIGH (ref 0.0–2.0)
BASOS ABS: 0 10*3/uL (ref 0.0–0.1)
EOS ABS: 0.1 10*3/uL (ref 0.0–0.5)
EOS%: 14.1 % — ABNORMAL HIGH (ref 0.0–7.0)
HEMATOCRIT: 26.1 % — AB (ref 34.8–46.6)
HEMOGLOBIN: 8.5 g/dL — AB (ref 11.6–15.9)
LYMPH#: 0.2 10*3/uL — AB (ref 0.9–3.3)
LYMPH%: 29.6 % (ref 14.0–49.7)
MCH: 29.6 pg (ref 25.1–34.0)
MCHC: 32.6 g/dL (ref 31.5–36.0)
MCV: 90.9 fL (ref 79.5–101.0)
MONO#: 0 10*3/uL — AB (ref 0.1–0.9)
MONO%: 4.2 % (ref 0.0–14.0)
NEUT#: 0.4 10*3/uL — CL (ref 1.5–6.5)
NEUT%: 49.3 % (ref 38.4–76.8)
Platelets: 39 10*3/uL — ABNORMAL LOW (ref 145–400)
RBC: 2.87 10*6/uL — ABNORMAL LOW (ref 3.70–5.45)
RDW: 15.8 % — ABNORMAL HIGH (ref 11.2–14.5)
WBC: 0.7 10*3/uL — CL (ref 3.9–10.3)
nRBC: 0 % (ref 0–0)

## 2016-09-23 LAB — COMPREHENSIVE METABOLIC PANEL
ALBUMIN: 3.7 g/dL (ref 3.5–5.0)
ALK PHOS: 60 U/L (ref 40–150)
ALT: 19 U/L (ref 0–55)
AST: 14 U/L (ref 5–34)
Anion Gap: 8 mEq/L (ref 3–11)
BILIRUBIN TOTAL: 0.47 mg/dL (ref 0.20–1.20)
BUN: 26.8 mg/dL — AB (ref 7.0–26.0)
CALCIUM: 8.9 mg/dL (ref 8.4–10.4)
CO2: 24 mEq/L (ref 22–29)
Chloride: 109 mEq/L (ref 98–109)
Creatinine: 1 mg/dL (ref 0.6–1.1)
EGFR: 64 mL/min/{1.73_m2} — AB (ref >90)
Glucose: 92 mg/dl (ref 70–140)
POTASSIUM: 4.1 meq/L (ref 3.5–5.1)
Sodium: 142 mEq/L (ref 136–145)
Total Protein: 6.9 g/dL (ref 6.4–8.3)

## 2016-09-23 MED ORDER — HEPARIN SOD (PORK) LOCK FLUSH 100 UNIT/ML IV SOLN
500.0000 [IU] | Freq: Once | INTRAVENOUS | Status: AC | PRN
  Administered 2016-09-23: 500 [IU] via INTRAVENOUS
  Filled 2016-09-23: qty 5

## 2016-09-23 MED ORDER — SODIUM CHLORIDE 0.9% FLUSH
10.0000 mL | INTRAVENOUS | Status: DC | PRN
  Administered 2016-09-23: 10 mL via INTRAVENOUS
  Filled 2016-09-23: qty 10

## 2016-09-23 NOTE — Patient Instructions (Signed)
Implanted Port Home Guide An implanted port is a type of central line that is placed under the skin. Central lines are used to provide IV access when treatment or nutrition needs to be given through a person's veins. Implanted ports are used for long-term IV access. An implanted port may be placed because:  You need IV medicine that would be irritating to the small veins in your hands or arms.  You need long-term IV medicines, such as antibiotics.  You need IV nutrition for a long period.  You need frequent blood draws for lab tests.  You need dialysis.  Implanted ports are usually placed in the chest area, but they can also be placed in the upper arm, the abdomen, or the leg. An implanted port has two main parts:  Reservoir. The reservoir is round and will appear as a small, raised area under your skin. The reservoir is the part where a needle is inserted to give medicines or draw blood.  Catheter. The catheter is a thin, flexible tube that extends from the reservoir. The catheter is placed into a large vein. Medicine that is inserted into the reservoir goes into the catheter and then into the vein.  How will I care for my incision site? Do not get the incision site wet. Bathe or shower as directed by your health care provider. How is my port accessed? Special steps must be taken to access the port:  Before the port is accessed, a numbing cream can be placed on the skin. This helps numb the skin over the port site.  Your health care provider uses a sterile technique to access the port. ? Your health care provider must put on a mask and sterile gloves. ? The skin over your port is cleaned carefully with an antiseptic and allowed to dry. ? The port is gently pinched between sterile gloves, and a needle is inserted into the port.  Only "non-coring" port needles should be used to access the port. Once the port is accessed, a blood return should be checked. This helps ensure that the port  is in the vein and is not clogged.  If your port needs to remain accessed for a constant infusion, a clear (transparent) bandage will be placed over the needle site. The bandage and needle will need to be changed every week, or as directed by your health care provider.  Keep the bandage covering the needle clean and dry. Do not get it wet. Follow your health care provider's instructions on how to take a shower or bath while the port is accessed.  If your port does not need to stay accessed, no bandage is needed over the port.  What is flushing? Flushing helps keep the port from getting clogged. Follow your health care provider's instructions on how and when to flush the port. Ports are usually flushed with saline solution or a medicine called heparin. The need for flushing will depend on how the port is used.  If the port is used for intermittent medicines or blood draws, the port will need to be flushed: ? After medicines have been given. ? After blood has been drawn. ? As part of routine maintenance.  If a constant infusion is running, the port may not need to be flushed.  How long will my port stay implanted? The port can stay in for as long as your health care provider thinks it is needed. When it is time for the port to come out, surgery will be   done to remove it. The procedure is similar to the one performed when the port was put in. When should I seek immediate medical care? When you have an implanted port, you should seek immediate medical care if:  You notice a bad smell coming from the incision site.  You have swelling, redness, or drainage at the incision site.  You have more swelling or pain at the port site or the surrounding area.  You have a fever that is not controlled with medicine.  This information is not intended to replace advice given to you by your health care provider. Make sure you discuss any questions you have with your health care provider. Document  Released: 06/03/2005 Document Revised: 11/09/2015 Document Reviewed: 02/08/2013 [employer] Interactive Patient Education  2017 [employer]  

## 2016-09-23 NOTE — Telephone Encounter (Signed)
Dr. [name] reviewed CBC results today.  Spoke with pt and reinforced Neutropenic precautions with pt.  Reinforced to monitor temp, good hand hygiene, avoid crowded areas.   Informed pt that Dr. [name] would like to repeat labs in 2 days. Gave pt appt date and time for labs on Wed 09/25/16 at 1 pm.   Pt voiced understanding.

## 2016-09-23 NOTE — Progress Notes (Addendum)
[name]  Telephone:([phone]) [phone] Fax:([phone]  Clinic Follow Up Note   Patient Care Team: [name], MD as PCP - General (Internal Medicine) 09/30/2016   CHIEF COMPLAINTS:  Follow up right breast cancer   Oncology History   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 [facility])   Staging form: Breast, AJCC 7th Edition   - Clinical stage from 11/30/2015: Stage IV (T4d, N3c, M1) - Signed by Truitt Merle, MD on 01/05/2016      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 [facility])   11/30/2015 Initial Diagnosis    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 [facility])      11/30/2015 Initial Biopsy    Right axilla lymph node biopsy showed metastatic poorly differentiated adenocarcinoma, IHC positive for CK AE1/AE3, CK7, E-cadherin, CDX-2, and focally very weak to equivocal staining for ER      12/21/2015 Imaging    Bilateral breast MRI showed extensive non-mass enhancement involving the entire right breast, right axillary, supraclavicular, and internal mammary adenopathy, new left axillary adenopathy      12/27/2015 Mammogram    Diagnostic mammogram and ultrasound of right breast and axilla showed diffuse skin thickening and increased density, no focal mass, numerous enlarged right axillary lymph node, the largest measuring 5.3cm.       12/27/2015 Initial Biopsy    Right breast core needle biopsy showed invasive ductal carcinoma, and DCIS, lymphovascular invasion identified      12/27/2015 Receptors her2    ER negative, PR negative, Ki-67 70%, HER-2 negative      01/03/2016 Imaging    PET scan showed diffuse asymmetric right breast hypermetabolic soft tissue density and skin thickening, hypermetabolic metastatic lymphadenopathy in bilateral axillary and subpectoral regions, right supraclavicular region and the right cervical level 5      01/10/2016 - 05/28/2016 Chemotherapy    Weekly Taxol 80 mg/m, second cycle was  postponed to 8/22 due to pt's non-compliance, stopped due to disease progression. She developed mild peripheral neuropathy in early Dec 2017.      02/07/2016 Pathology Results    Left axillary lymph node biopsy showed metastatic poorly differentiated carcinoma, triple negative.      04/24/2016 Imaging    CT CHEST, ABDOMEN AND PELVIS WITH CONTRAST IMPRESSION: 1. Bulky right axillary and mild right retropectoral and right supraclavicular lymphadenopathy is mildly decreased since 01/03/2016 PET-CT. 2. Left axillary lymphadenopathy is mildly increased. 3. Diffuse asymmetric thickening of the skin and fibroglandular soft tissues of the right breast is not appreciably changed. 4. No definite additional sites of metastatic disease in the chest, abdomen or pelvis. 5. Subcentimeter right liver lobe lesion is too small to characterize and is probably stable. 6. Additional findings include aortic atherosclerosis, 1 vessel coronary atherosclerosis, tiny hiatal hernia and myomatous uterus.      06/05/2016 Imaging    PET scan showed progressive right breast mass, extending in size and metabolic activity. Enlarging bilateral axillary adenopathy likewise in increased activity. A right supraclavicular lymph node is slightly smaller than before, no other new metastasis.      06/21/2016 - 08/12/2016 Chemotherapy    Adriamycin 60 mg/m, and Cytoxan 600 mg/m, every 2-3 weeks, for a total of 4 cycles.       08/23/2016 PET scan    1. Response to therapy. Significant decrease in size and hypermetabolism of right breast mass and bilateral axillary nodal metastasis. Hypermetabolism within a right supraclavicular node has resolved.  2. New soft tissue fullness and hypermetabolism within the lateral left breast, suspicious for either metastatic disease or new primary. Mastitis could look similar. 3. No extrathoracic metastatic disease identified. 4.  Coronary artery atherosclerosis. Aortic atherosclerosis. 5.  Diffuse marrow hypermetabolism is likely related to stimulation by chemotherapy. This decreases sensitivity for osseous metastasis.      09/09/2016 -  Chemotherapy    Carboplatin AUC 2, and gemcitabine 1000 mg/m, on day 1 and 8 every 21 days, dose reduced from cycle 2 due to significant neutropenia and thrombocytopenia. Neulasta added on day 9 from cycle 2         HISTORY OF PRESENTING ILLNESS (12/14/2015) :  [name] 71 y.o. 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 She is accompanied by her husband to the clinic today.  She noticed a lump at right axilla in 08/2015, no pain or other complains, she also report right breast swelling, no pain, skin erythema, nipple discharge or other complain. She otherwise feels well. She was initially seen at urgent care in March, then was referred to establish her care with prior care physician at [facility] internal medicine center. Her screening mammogram was negative in January 2017. She subsequently underwent CT chest on 09/27/2015 which showed enlarged and inflamed right breast, bulky right axillary lymph nodes. She was referred to breast Center for diagnostic mammogram and ultrasound of right breast, which was negative. She underwent ultrasound guided right axillary node biopsy on 12/05/2015, which reviewed port differentiated carcinoma. ER weakly focally positive.  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 She works independently. She lives with her husband. She denies any fever, night sweats, chills, or skin itchiness.  CURRENT THERAPY: third line chemo carboplatin AUC 2 and gemcitabine '1000mg'$ /m2  weekly, 2 weeks on and one week off, started on 09/09/16, dose reduction from cycle 2 due to significant neutropenia and thrombocytopenia, Neulasta added on day 9 from cycle 2  INTERIM HISTORY:  [name] returns for follow up. She is doing well today. She tolerated the new chemotherapy well other than constipation. The numbness and tingling in her hands and feet are still present, but not worse. She has some feet swelling, but she believes it might be due to her high diet of fried chicken and high salt foods from the past week. Denies bleeding, fatigue, loss of appetite, or any other concerns.   MEDICAL HISTORY:  Past Medical History:  Diagnosis Date  . Arthritis   . GERD (gastroesophageal reflux disease)   . Hypertension   . Shortness of breath   . Sleep apnea   . Stroke [facility])     SURGICAL HISTORY: Past Surgical History:  Procedure Laterality Date  . IR GENERIC HISTORICAL  06/13/2016   IR US GUIDE VASC ACCESS LEFT 06/13/2016 Corrie Mckusick, DO [facility]-INTER[facility]  . IR GENERIC HISTORICAL  06/13/2016   IR FLUORO GUIDE PORT INSERTION LEFT 06/13/2016 Corrie Mckusick, DO [facility]  . NO PAST SURGERIES      SOCIAL HISTORY: Social History   Social History  . Marital status: Married    Spouse name: N/A  .  Number of children: 4  . Years of education: 8 TH   Occupational History  . Not on file.   Social History Main Topics  . Smoking status: Former Smoker    Packs/day: 0.50    Years: 1.00    Types: Cigarettes    Start date: 06/17/1968    Quit date: 06/17/1973  . Smokeless tobacco: Never Used     Comment: Quit in 30s  . Alcohol use No     Comment: former drinker for approximately 10 years  . Drug use: No  . Sexual activity: Not on file   Other Topics Concern  . Not on file   Social History Narrative   Patient is married with 3 children still living and 1 deceased son.   Patient is right handed.   Patient has 8 th grade education.   Patient drinks 2-3 servings daily.    FAMILY  HISTORY: Family History  Problem Relation Age of Onset  . Heart disease Mother   . Cancer Father 62    throat cancer  . Cancer Cousin     breast cancer    ALLERGIES:  has No Known Allergies.  MEDICATIONS:  Current Outpatient Prescriptions  Medication Sig Dispense Refill  . amLODipine (NORVASC) 10 MG tablet Take 1 tablet (10 mg total) by mouth daily. 90 tablet 3  . aspirin EC 81 MG tablet Take 1 tablet (81 mg total) by mouth daily. 90 tablet 3  . atorvastatin (LIPITOR) 20 MG tablet TAKE 1 TABLET EVERY DAY (Patient taking differently: Take 20 mg by mouth daily) 90 tablet 3  . lidocaine-prilocaine (EMLA) cream Apply to port @ 1 hour prior to treatment. 30 g 1  . metoprolol tartrate (LOPRESSOR) 25 MG tablet TAKE 1 TABLET EVERY DAY 90 tablet 3  . ondansetron (ZOFRAN) 8 MG tablet Take by mouth every 8 (eight) hours as needed for nausea or vomiting.    . prochlorperazine (COMPAZINE) 10 MG tablet Take 10 mg by mouth every 6 (six) hours as needed for nausea or vomiting.    . traMADol (ULTRAM) 50 MG tablet Take 1 tablet (50 mg total) by mouth every 8 (eight) hours as needed. 60 tablet 1   No current facility-administered medications for this visit.    Facility-Administered Medications Ordered in Other Visits  Medication Dose Route Frequency Provider Last Rate Last Dose  . sodium chloride flush (NS) 0.9 % injection 10 mL  10 mL Intravenous PRN Truitt Merle, MD   10 mL at 08/12/16 1754       REVIEW OF SYSTEMS: Constitutional: Denies fevers, chills or abnormal night sweats  Eyes: Denies blurriness of vision, double vision or watery eyes Ears, nose, mouth, throat, and face: Denies mucositis or sore throat Respiratory: Denies cough, dyspnea or wheezes Cardiovascular: Denies palpitation, chest discomfort (+) leg swelling  Gastrointestinal:  Denies nausea, heartburn or change in bowel habits (+) constipation Skin: no skin rashes.  Lymphatics: Denies new lymphadenopathy or easy  bruising Neurological: Negative Behavioral/Psych: Mood is stable, no new changes (+) numbness and tingling hands and feet, overall much improved  All other systems were reviewed with the patient and are negative.  PHYSICAL EXAMINATION: ECOG PERFORMANCE STATUS: 1  Vitals:   09/30/16 1411  BP: 126/61  Pulse: 64  Resp: 18  Temp: 98.6 F (37 C)   Filed Weights   09/30/16 1411  Weight: 180 lb 4.8 oz (81.8 kg)   GENERAL:alert, no distress and comfortable SKIN: skin color, texture, turgor are normal, no rashes  or significant lesions EYES: normal, conjunctiva are pink and non-injected, sclera clear OROPHARYNX:no exudate, no erythema and lips, buccal mucosa, and tongue normal  NECK: supple, thyroid normal size, non-tender, without nodularity LYMPH:  no palpable lymphadenopathy in the cervical, axillary or inguinal LUNGS: clear to auscultation and percussion with normal breathing effort HEART: regular rate & rhythm and no murmurs and no lower extremity edema ABDOMEN:abdomen soft, non-tender and normal bowel sounds Musculoskeletal:no cyanosis of digits and no clubbing  PSYCH: alert & oriented x 3 with fluent speech NEURO: Decreased vibration sensation on bilateral ankle and wrist. SKIN: (+) Dry skin of the bilateral feet, no sign of infection. EXTREMITIES: Bilateral hand and lower extremity edema. Breasts: her entire right breast is firm, but softer than before, with associated skin erythema and edema which appears slightly improved overall. There is a 2 x 1.5 cm mass at the anterior right axilla, close to right chest wall of breast margin, no tenderness, movable. Left axilla moveable lymph node is not palpable anymore   LABORATORY DATA:  I have reviewed the data as listed CBC Latest Ref Rng & Units 09/30/2016 09/25/2016 09/23/2016  WBC 3.9 - 10.3 10e3/uL 1.2(L) 1.6(L) 0.7(LL)  Hemoglobin 11.6 - 15.9 g/dL 8.5(L) 8.1(L) 8.5(L)  Hematocrit 34.8 - 46.6 % 25.7(L) 25.0(L) 26.1(L)  Platelets 145  - 400 10e3/uL 101(L) 26(L) 39(L)   CMP Latest Ref Rng & Units 09/30/2016 09/25/2016 09/23/2016  Glucose 70 - 140 mg/dl 95 88 92  BUN 7.0 - 26.0 mg/dL 26.2(H) 28.4(H) 26.8(H)  Creatinine 0.6 - 1.1 mg/dL 1.2(H) 1.1 1.0  Sodium 136 - 145 mEq/L 142 144 142  Potassium 3.5 - 5.1 mEq/L 4.2 4.1 4.1  Chloride 101 - 111 mmol/L - - -  CO2 22 - 29 mEq/L _0 Calcium 8.4 - 10.4 mg/dL 8.9 9.0 8.9  Total Protein 6.4 - 8.3 g/dL 7.0 6.9 6.9  Total Bilirubin 0.20 - 1.20 mg/dL 0.39 0.39 0.47  Alkaline Phos 40 - 150 U/L 59 63 60  AST 5 - 34 U/L _1 ALT 0 - 55 U/L _2 CA27.29: 01/05/2016: 155.9 02/27/2016: 96.7 03/26/2016: 64.7 04/23/2016: 65.4 05/21/2016: 99.2 07/15/2016: 215.4  08/12/2016: 176.9 09/09/2016: 112  PATHOLOGY REPORT: Diagnosis 11/30/2015 Lymph node, needle/core biopsy, right breast/axilla - LYMPH NODE POSITIVE FOR METASTATIC POORLY DIFFERENTIATED CARCINOMA. - SEE COMMENT. Microscopic Comment Immunohistochemical stains are performed. The tumor is positive for cytokeratin AE1/AE3, cytokeratin 7 and E-cadherin. CDX-2 demonstrates weak positivity. Estrogen receptor demonstrates focal very weak to equivocal staining. Gross cystic disease fluid protein, cytokeratin 20, TTF-1, Melan-A and S100 are all negative. The findings are consistent with a poorly differentiated carcinoma. Given the staining pattern, an upper gastrointestinal and pancreatobiliary primary source should be ruled out. In addition, given the axillary location of the biopsy and focal weak to equivocal estrogen receptor staining, ruling out a poorly differentiated carcinoma of breast primary source is also prudent. Lastly, a gynecologic primary may be considered. Correlation with clinical and radiologic impression is essential. Dr. Vicente Males has seen this case in consultation with essential agreement of the above diagnosis and comment. The findings are called to the [facility] on 12/04/15. (RAH:gt,  12/04/15)  Diagnosis 12/27/2015 Breast, right, needle core biopsy, central - INVASIVE MAMMARY CARCINOMA. - MAMMARY CARCINOMA IN SITU. - LYMPH/VASCULAR INVASION IS IDENTIFIED. - SEE COMMENT. Microscopic Comment An E-cadherin stain will be performed to determine if the carcinoma is ductal or lobular in nature. The results of the  stain will be reported in an addendum to follow. Although definitive grading of breast carcinoma is best done on excision, the features of the invasive tumor from the right central breast biopsy are compatible with a grade 3 breast carcinoma. Breast prognostic markers will be performed and reported in an addendum. Findings are called to the [facility] on 12/28/2015. Dr. Lyndon Code has seen this case in consultation with agreement. (RH:kh 12-28-15) ADDENDUM: An E-cadherin immunohistochemical stain is performed which is positive in both the invasive and in situ components confirming the ductal nature of both (i.e. invasive ductal carcinoma with ductal carcinoma in situ). (RAH:gt, 12/29/15) PROGNOSTIC INDICATORS Results: IMMUNOHISTOCHEMICAL AND MORPHOMETRIC ANALYSIS PERFORMED MANUALLY Estrogen Receptor: 0%, NEGATIVE Progesterone Receptor: 0%, NEGATIVE Proliferation Marker Ki67: 70% Results: HER2 - NEGATIVE RATIO OF HER2/CEP17 SIGNALS 1.13 AVERAGE HER2 COPY NUMBER PER CELL 1.75  Diagnosis 02/07/2016 Lymph node, needle/core biopsy, left axillary - METASTATIC POORLY DIFFERENTIATED CARCINOMA. Results: IMMUNOHISTOCHEMICAL AND MORPHOMETRIC ANALYSIS PERFORMED MANUALLY Estrogen Receptor: 0%, NEGATIVE Progesterone Receptor: 0%, NEGATIVE Results: HER2 - NEGATIVE RATIO OF HER2/CEP17 SIGNALS 1.21 AVERAGE HER2 COPY NUMBER PER CELL 2.05  RADIOGRAPHIC STUDIES: I have personally reviewed the radiological images as listed and agreed with the findings in the report.  PET 08/23/2016 IMPRESSION: 1. Response to therapy. Significant decrease in size and hypermetabolism  of right breast mass and bilateral axillary nodal metastasis. Hypermetabolism within a right supraclavicular node has resolved. 2. New soft tissue fullness and hypermetabolism within the lateral left breast, suspicious for either metastatic disease or new primary. Mastitis could look similar. 3. No extrathoracic metastatic disease identified. 4.  Coronary artery atherosclerosis. Aortic atherosclerosis. 5. Diffuse marrow hypermetabolism is likely related to stimulation by chemotherapy. This decreases sensitivity for osseous metastasis.  PET 06/05/2016 IMPRESSION: 1. Progressive right breast mass, expanding in size and metabolic activity. Enlarging but bilateral axillary adenopathy likewise increased activity. A right supraclavicular lymph node is slightly smaller than previous but has a higher standard uptake value. Overall appearance compatible with progression but no further new metastatic spread compared to 01/03/2016. 2. Coronary, aortic arch, and branch vessel atherosclerotic vascular disease. Aortoiliac atherosclerotic vascular disease. 3. Extensive edema along the anterior chest wall especially both breasts, right greater than left.  ASSESSMENT & PLAN:  71 y.o. female, with past medical history of HTN, GERD, sleep apnea, presented with bulky right axillary adenopathy.  1. Primary cancer of right breast with metastasis to distant lymph nodes, invasive ductal carcinoma and DCIS, ER-/PR-/HER2-,  [id number], stage IV  -I previously reviewed her mammogram, ultrasound and a CT chest, and biopsy pathology findings with patient and her husband in details -I previously discussed her breast MRI, PET scan as this is abiopsy results in great details with patient and her family members  -Her breast MRI findings and breast biopsy confirmed primary breast cancer, triple negative. Unfortunately her cancer has metastasized to left axilla and cervical lymph nodes, which are distant metastasis.  -We  previously reviewed the natural history of metastatic triple negative breast cancer, which is very aggressive, and her cancer is incurable at this stage  -Giving the metastatic disease, surgery up front is not indicated, I recommended systemic chemotherapy. -She has been on weekly Taxol for 4-5 month -I previously reviewed her restaging CT scan findings from 04/24/16, which showed slight improvement in the right-sided adenopathy, no other new metastasis. -I previously reviewed her restaging PET scan from 05/2016, which showed significant cancer progression in the right breast, no other sternal metastasis. - I recommend swiitch her chemotherapy  to Adriamycin and Cytoxan, for rapid disease control, to prevent bleeding and infection from the primary breast cancer. -She has had 4 cycles of AC, treatment was postponed multiple times due to compliance issue or scan. Her right breast lesion and axillary have improved clinically. Her right breast pain has resolved now. -I previously reviewed her restaging PET scan images from 08/23/2016 with patient and her husband in person, she has had good partial response to [facility], posterior the primary right breast mass and bulky adenopathy has improved. -The previous PET scan reviewed a new lesion in the left breast, possible metastatic lesion versus new primary. We have previously biopsied to the left axillary lymph node, which was triple negative also. I do not think the biopsy of left breast would change our management, will follow up on next staging scan  -She is now on third line chemotherapy with carboplatin and gemcitabine, tolerated well, however she has developed a severe neutropenia and some cytopenia, I'll reduce her dose from next cycle -Labs reviewed, still neutropenic and a moderately thrombotic cytopenic, hold treatment today. Reschedule to next week.  -I will add Neulasta injection on day 9 from next cycle chemotherapy.  -Plan to repeat PET scan after 3-4  cycles of chemotherapy -she is interested in participating the rsh-chcc-Taxanes study, which is lead by our pharmacist Nuala Alpha. The study is looking into the correlation between taxine-induced neuropathy and gene mutations. She was consented by [facility] today and oral mucosa swab was done to collect her DNA for testing.   2. ANEMIA in neoplastic disease -She developed normocytic mild anemia in the past few months. -Her anemia workup shows a normal folic acid and Q73 level, increased ferritin, decreased serum iron, TIBC and transferrin saturation. This is most consistent with anemia of chronic disease, secondary to her CKD and underlying malignancy.  -I previously encouraged her to continue oral iron supplement  3. Neutropenia and thrombocytopenia from chemotherapy -She had nadir plt 26K and ANC 0.4K after first cycle chemotherapy -Her blood counts are recovering -We'll reduce her chemotherapy dose from next cycle  4. CKD stage III -Her GFR is in 40-50's  -No lab signs of tumor lysis -Possibly related to her underlying hypertension and diuretics  -Her Cr has much improved since she stopped her Lasix. -Avoid nephrotoxins, such as NSAIDs and IV CT contrast  5. HTN, GERD, obesity, sleep apnea -She'll continue follow-up with her primary care physician  6. Right breast pain -secondary to breast cancer, much improved lately after chemo  -continue tramadol as needed   7. Goal of care discussion  -We previously discussed the incurable nature of her cancer, and the overall poor prognosis, especially if she does not have good response to chemotherapy or progress on chemo -The patient understands the goal of care is palliative. -I recommend DNR/DNI, she will think about it   PLAN -Hold Carboplatin and Gemcitabine today, reschedule for next week, with dose reduction Carbo AUC 1.5 and gemcitabine 873m/m2, on day 1, 8 every 21 days, with neulasta on day 9 -I'll see her back in 2  weeks.   I spent 20 minutes counseling the patient face to face. The total time spent in the appointment was 25 minutes and more than 50% was on counseling.  This document serves as a record of services personally performed by YTruitt Merle MD. It was created on her behalf by [email or web address], a trained medical scribe. The creation of this record is based on the scribe's personal observations and the provider's  statements to them. This document has been checked and approved by the attending provider.  I have reviewed the above documentation for accuracy and completeness and I agree with the above.    Truitt Merle, MD 09/30/2016

## 2016-09-24 ENCOUNTER — Telehealth: Payer: Self-pay | Admitting: Internal Medicine

## 2016-09-24 NOTE — Telephone Encounter (Signed)
Calling to confirm appointment for 09/25/16 at 9:15 no answer

## 2016-09-25 ENCOUNTER — Ambulatory Visit (INDEPENDENT_AMBULATORY_CARE_PROVIDER_SITE_OTHER): Payer: Medicare HMO | Admitting: Internal Medicine

## 2016-09-25 ENCOUNTER — Ambulatory Visit (HOSPITAL_BASED_OUTPATIENT_CLINIC_OR_DEPARTMENT_OTHER): Payer: Medicare HMO

## 2016-09-25 ENCOUNTER — Encounter: Payer: Self-pay | Admitting: Internal Medicine

## 2016-09-25 ENCOUNTER — Telehealth: Payer: Self-pay | Admitting: Hematology

## 2016-09-25 ENCOUNTER — Ambulatory Visit: Payer: Medicare HMO

## 2016-09-25 DIAGNOSIS — N183 Chronic kidney disease, stage 3 unspecified: Secondary | ICD-10-CM

## 2016-09-25 DIAGNOSIS — I129 Hypertensive chronic kidney disease with stage 1 through stage 4 chronic kidney disease, or unspecified chronic kidney disease: Secondary | ICD-10-CM | POA: Diagnosis not present

## 2016-09-25 DIAGNOSIS — E7800 Pure hypercholesterolemia, unspecified: Secondary | ICD-10-CM

## 2016-09-25 DIAGNOSIS — C799 Secondary malignant neoplasm of unspecified site: Secondary | ICD-10-CM | POA: Diagnosis not present

## 2016-09-25 DIAGNOSIS — C50911 Malignant neoplasm of unspecified site of right female breast: Secondary | ICD-10-CM | POA: Diagnosis not present

## 2016-09-25 DIAGNOSIS — I10 Essential (primary) hypertension: Secondary | ICD-10-CM

## 2016-09-25 DIAGNOSIS — C50411 Malignant neoplasm of upper-outer quadrant of right female breast: Secondary | ICD-10-CM

## 2016-09-25 DIAGNOSIS — Z87891 Personal history of nicotine dependence: Secondary | ICD-10-CM | POA: Diagnosis not present

## 2016-09-25 DIAGNOSIS — Z8673 Personal history of transient ischemic attack (TIA), and cerebral infarction without residual deficits: Secondary | ICD-10-CM

## 2016-09-25 DIAGNOSIS — E785 Hyperlipidemia, unspecified: Secondary | ICD-10-CM | POA: Diagnosis not present

## 2016-09-25 DIAGNOSIS — Z79899 Other long term (current) drug therapy: Secondary | ICD-10-CM

## 2016-09-25 DIAGNOSIS — I517 Cardiomegaly: Secondary | ICD-10-CM | POA: Diagnosis not present

## 2016-09-25 DIAGNOSIS — Z7982 Long term (current) use of aspirin: Secondary | ICD-10-CM | POA: Diagnosis not present

## 2016-09-25 DIAGNOSIS — D649 Anemia, unspecified: Secondary | ICD-10-CM | POA: Diagnosis not present

## 2016-09-25 LAB — COMPREHENSIVE METABOLIC PANEL
ALT: 18 U/L (ref 0–55)
ANION GAP: 8 meq/L (ref 3–11)
AST: 15 U/L (ref 5–34)
Albumin: 3.7 g/dL (ref 3.5–5.0)
Alkaline Phosphatase: 63 U/L (ref 40–150)
BILIRUBIN TOTAL: 0.39 mg/dL (ref 0.20–1.20)
BUN: 28.4 mg/dL — ABNORMAL HIGH (ref 7.0–26.0)
CALCIUM: 9 mg/dL (ref 8.4–10.4)
CO2: 25 meq/L (ref 22–29)
Chloride: 111 mEq/L — ABNORMAL HIGH (ref 98–109)
Creatinine: 1.1 mg/dL (ref 0.6–1.1)
EGFR: 58 mL/min/{1.73_m2} — AB (ref >90)
Glucose: 88 mg/dl (ref 70–140)
Potassium: 4.1 mEq/L (ref 3.5–5.1)
Sodium: 144 mEq/L (ref 136–145)
TOTAL PROTEIN: 6.9 g/dL (ref 6.4–8.3)

## 2016-09-25 LAB — CBC WITH DIFFERENTIAL/PLATELET
BASO%: 0.6 % (ref 0.0–2.0)
BASOS ABS: 0 10*3/uL (ref 0.0–0.1)
EOS ABS: 0.2 10*3/uL (ref 0.0–0.5)
EOS%: 9.9 % — ABNORMAL HIGH (ref 0.0–7.0)
HCT: 25 % — ABNORMAL LOW (ref 34.8–46.6)
HGB: 8.1 g/dL — ABNORMAL LOW (ref 11.6–15.9)
LYMPH%: 18.5 % (ref 14.0–49.7)
MCH: 29.9 pg (ref 25.1–34.0)
MCHC: 32.4 g/dL (ref 31.5–36.0)
MCV: 92.3 fL (ref 79.5–101.0)
MONO#: 0 10*3/uL — ABNORMAL LOW (ref 0.1–0.9)
MONO%: 2.5 % (ref 0.0–14.0)
NEUT#: 1.1 10*3/uL — ABNORMAL LOW (ref 1.5–6.5)
NEUT%: 68.5 % (ref 38.4–76.8)
NRBC: 0 % (ref 0–0)
PLATELETS: 26 10*3/uL — AB (ref 145–400)
RBC: 2.71 10*6/uL — AB (ref 3.70–5.45)
RDW: 15.9 % — ABNORMAL HIGH (ref 11.2–14.5)
WBC: 1.6 10*3/uL — ABNORMAL LOW (ref 3.9–10.3)
lymph#: 0.3 10*3/uL — ABNORMAL LOW (ref 0.9–3.3)

## 2016-09-25 MED ORDER — AMLODIPINE BESYLATE 10 MG PO TABS: 10.0000 mg | ORAL_TABLET | Freq: Every day | ORAL | 3 refills | Status: DC

## 2016-09-25 MED ORDER — AMLODIPINE BESYLATE 10 MG PO TABS: 10.0000 mg | ORAL_TABLET | Freq: Every day | ORAL | 3 refills | Status: AC

## 2016-09-25 NOTE — Assessment & Plan Note (Signed)
She is undergoing third line chemotherapy at this time for metastatic triple negative breast cancer.  She is doing well, breast mass is getting softer.  She has occasional fatigue and/or leg swelling related to chemo.  She has a left sided port a cath in place which is non tender, no erythema surrounding.   Plan Continue follow up with Oncology.

## 2016-09-25 NOTE — Assessment & Plan Note (Signed)
She had a hematoma in the right thymic area in 2015.  She is on statin and aspirin.  No new neurological changes at this time.   Plan Continue statin and aspirin

## 2016-09-25 NOTE — Assessment & Plan Note (Signed)
Last LDL was 85.  She is on atorvastatin without issue or ADE.   Plan Continue atorvastatin.

## 2016-09-25 NOTE — Patient Instructions (Signed)
[name] - -   For your blood pressure, please keep taking amlodipine 10mg  and metoprolol 25mg  BID.    Please come back to see me in 6 months, sooner if you need to.

## 2016-09-25 NOTE — Assessment & Plan Note (Signed)
TTE from 2017 with grade 1 DD.  She has no swelling on exam today.  This is likely due to her chronic HTN.  Monitor for change.

## 2016-09-25 NOTE — Assessment & Plan Note (Signed)
BP on recheck today was 132/78, slightly above goal.  Given her ongoing cancer treatment, will continue to monitor.    Plan Continue amlodipine, metoprolol.

## 2016-09-25 NOTE — Assessment & Plan Note (Signed)
Last Cr was stable at 1.1.  Continue to monitor.  Plan for good BP control.

## 2016-09-25 NOTE — Telephone Encounter (Signed)
Patient arrived to registration for a lab appointment, but had no appointment scheduled for today. Per patient, she received a call on Monday from the desk nurse Thu, to come for labs to be drawn today. Lab appointment was added per Dr [name].

## 2016-09-25 NOTE — Progress Notes (Signed)
   Subjective:    Patient ID: [name], female    DOB: 25-Feb-1946, 71 y.o.   MRN: [id number]  CC: Routine follow up for HTN, last see 1 year ago.   HPI  [name] is a 71yo woman with PMH of HTN, CM, GERD, CKD, h/o stroke, HLD, breast cancer undergoing treatment, OA who presents for follow up.   Doing well, no complaints.  Just here to keep follow up with PCP appointments.  She reports that her breast cancer treatment is going well.  She is having chemotherapy which does cause some nausea.  She takes compazine and zofran for these issues.  She also has some anemia which is attributed to the cancer and therapy.  Review of last heme note -  She is now on 3rd line chemotherapy; metastatic breast cancer, triple negative.  She is on iron for anemia. DNR/DNI has been recommended.    She brought in her medications today, she is taking lipitor, metoprolol, amlodipine and aspirin along with some medications prescribed by her oncologist.  I will refill these today.  BP on recheck was 132/78 which is only slightly above goal.     Review of Systems  Constitutional: Negative for activity change and fatigue.  Respiratory: Negative for cough and shortness of breath.   Cardiovascular: Positive for leg swelling (occasional). Negative for chest pain.  Gastrointestinal: Positive for constipation and nausea. Negative for diarrhea and vomiting.  Neurological: Negative for dizziness, weakness and light-headedness.       Objective:   Physical Exam  Constitutional: She is oriented to person, place, and time. She appears well-developed and well-nourished. No distress.  Wearing mask  HENT:  Head: Normocephalic and atraumatic.  Cardiovascular: Normal rate, regular rhythm and normal heart sounds.   No murmur heard. Has a port in left chest, non tender, no erythema  Pulmonary/Chest: Effort normal and breath sounds normal. No respiratory distress. She exhibits no tenderness.  Genitourinary:    Genitourinary Comments: Right breast with hardened mass in the upper outer region.  Also with some axillary fullness on the right.   Musculoskeletal: She exhibits no edema or tenderness.  Neurological: She is alert and oriented to person, place, and time.  Psychiatric: She has a normal mood and affect. Her behavior is normal.    No labs today.  Reviewed labs from early April.       Assessment & Plan:  RTC in 6 months, sooner if needed.

## 2016-09-30 ENCOUNTER — Ambulatory Visit: Payer: Medicare HMO

## 2016-09-30 ENCOUNTER — Encounter: Payer: Self-pay | Admitting: Pharmacist

## 2016-09-30 ENCOUNTER — Encounter: Payer: Self-pay | Admitting: Hematology

## 2016-09-30 ENCOUNTER — Ambulatory Visit (HOSPITAL_BASED_OUTPATIENT_CLINIC_OR_DEPARTMENT_OTHER): Payer: Medicare HMO | Admitting: Hematology

## 2016-09-30 ENCOUNTER — Other Ambulatory Visit (HOSPITAL_BASED_OUTPATIENT_CLINIC_OR_DEPARTMENT_OTHER): Payer: Medicare HMO

## 2016-09-30 VITALS — BP 126/61 | HR 64 | Temp 98.6°F | Resp 18 | Ht 64.0 in | Wt 180.3 lb

## 2016-09-30 DIAGNOSIS — C778 Secondary and unspecified malignant neoplasm of lymph nodes of multiple regions: Secondary | ICD-10-CM | POA: Diagnosis not present

## 2016-09-30 DIAGNOSIS — D701 Agranulocytosis secondary to cancer chemotherapy: Secondary | ICD-10-CM

## 2016-09-30 DIAGNOSIS — N183 Chronic kidney disease, stage 3 unspecified: Secondary | ICD-10-CM

## 2016-09-30 DIAGNOSIS — Z95828 Presence of other vascular implants and grafts: Secondary | ICD-10-CM

## 2016-09-30 DIAGNOSIS — I10 Essential (primary) hypertension: Secondary | ICD-10-CM

## 2016-09-30 DIAGNOSIS — D630 Anemia in neoplastic disease: Secondary | ICD-10-CM

## 2016-09-30 DIAGNOSIS — N644 Mastodynia: Secondary | ICD-10-CM | POA: Diagnosis not present

## 2016-09-30 DIAGNOSIS — C50911 Malignant neoplasm of unspecified site of right female breast: Secondary | ICD-10-CM

## 2016-09-30 DIAGNOSIS — K219 Gastro-esophageal reflux disease without esophagitis: Secondary | ICD-10-CM | POA: Diagnosis not present

## 2016-09-30 DIAGNOSIS — D6959 Other secondary thrombocytopenia: Secondary | ICD-10-CM | POA: Diagnosis not present

## 2016-09-30 DIAGNOSIS — K5900 Constipation, unspecified: Secondary | ICD-10-CM | POA: Diagnosis not present

## 2016-09-30 DIAGNOSIS — E669 Obesity, unspecified: Secondary | ICD-10-CM

## 2016-09-30 DIAGNOSIS — G4733 Obstructive sleep apnea (adult) (pediatric): Secondary | ICD-10-CM

## 2016-09-30 LAB — CBC WITH DIFFERENTIAL/PLATELET
BASO%: 1.3 % (ref 0.0–2.0)
BASOS ABS: 0 10*3/uL (ref 0.0–0.1)
EOS ABS: 0.1 10*3/uL (ref 0.0–0.5)
EOS%: 7.4 % — ABNORMAL HIGH (ref 0.0–7.0)
HEMATOCRIT: 25.7 % — AB (ref 34.8–46.6)
HEMOGLOBIN: 8.5 g/dL — AB (ref 11.6–15.9)
LYMPH%: 17.7 % (ref 14.0–49.7)
MCH: 30.7 pg (ref 25.1–34.0)
MCHC: 32.9 g/dL (ref 31.5–36.0)
MCV: 93.6 fL (ref 79.5–101.0)
MONO#: 0.2 10*3/uL (ref 0.1–0.9)
MONO%: 18.2 % — ABNORMAL HIGH (ref 0.0–14.0)
NEUT#: 0.7 10*3/uL — ABNORMAL LOW (ref 1.5–6.5)
NEUT%: 55.4 % (ref 38.4–76.8)
PLATELETS: 101 10*3/uL — AB (ref 145–400)
RBC: 2.75 10*6/uL — ABNORMAL LOW (ref 3.70–5.45)
RDW: 17.8 % — AB (ref 11.2–14.5)
WBC: 1.2 10*3/uL — ABNORMAL LOW (ref 3.9–10.3)
lymph#: 0.2 10*3/uL — ABNORMAL LOW (ref 0.9–3.3)

## 2016-09-30 LAB — COMPREHENSIVE METABOLIC PANEL
ALBUMIN: 3.7 g/dL (ref 3.5–5.0)
ALK PHOS: 59 U/L (ref 40–150)
ALT: 14 U/L (ref 0–55)
ANION GAP: 7 meq/L (ref 3–11)
AST: 15 U/L (ref 5–34)
BUN: 26.2 mg/dL — AB (ref 7.0–26.0)
CALCIUM: 8.9 mg/dL (ref 8.4–10.4)
CO2: 26 mEq/L (ref 22–29)
Chloride: 110 mEq/L — ABNORMAL HIGH (ref 98–109)
Creatinine: 1.2 mg/dL — ABNORMAL HIGH (ref 0.6–1.1)
EGFR: 55 mL/min/{1.73_m2} — AB (ref >90)
Glucose: 95 mg/dl (ref 70–140)
POTASSIUM: 4.2 meq/L (ref 3.5–5.1)
Sodium: 142 mEq/L (ref 136–145)
Total Bilirubin: 0.39 mg/dL (ref 0.20–1.20)
Total Protein: 7 g/dL (ref 6.4–8.3)

## 2016-09-30 MED ORDER — HEPARIN SOD (PORK) LOCK FLUSH 100 UNIT/ML IV SOLN
500.0000 [IU] | Freq: Once | INTRAVENOUS | Status: AC
  Administered 2016-09-30: 500 [IU]
  Filled 2016-09-30: qty 5

## 2016-09-30 MED ORDER — SODIUM CHLORIDE 0.9 % IJ SOLN
10.0000 mL | Freq: Once | INTRAMUSCULAR | Status: AC
  Administered 2016-09-30: 10 mL
  Filled 2016-09-30: qty 10

## 2016-09-30 MED ORDER — SODIUM CHLORIDE 0.9% FLUSH
10.0000 mL | INTRAVENOUS | Status: DC | PRN
  Administered 2016-09-30: 10 mL via INTRAVENOUS
  Filled 2016-09-30: qty 10

## 2016-09-30 NOTE — Progress Notes (Signed)
Consent documentation  Study code: [id number]  Met with patient and her husband following the patients provider visit on 09/30/16 at 1400. Provided an overview of the "Pharmacogenetic analysis of toxicities related to administration of taxanes in breast cancer patients" study. Consent form was reviewed with the patient (reviewed the study purpose, patient's role, possible side effects, cost, risk, information protection) All of the patient's questions were answered and she agreed to participate in the study. A signed copy of the consent form was given to the patient. All eligibility criteria have been met and patient has been enrolled in the study.  Study sample was collected via buccal swab after consent was obtained. Patient was informed that the sample would be sent to the lab for processing after samples were collected from 25 patients and that it would take approximately 2 weeks for the lab to process that sample.   Met with the patient for 15 minutes.  Alyson N. Leonard, PharmD, BCPS Clinical Pharmacist- Oncology Pharmacy Resident     

## 2016-09-30 NOTE — Patient Instructions (Signed)
Implanted Port Home Guide An implanted port is a type of central line that is placed under the skin. Central lines are used to provide IV access when treatment or nutrition needs to be given through a person's veins. Implanted ports are used for long-term IV access. An implanted port may be placed because:  You need IV medicine that would be irritating to the small veins in your hands or arms.  You need long-term IV medicines, such as antibiotics.  You need IV nutrition for a long period.  You need frequent blood draws for lab tests.  You need dialysis.  Implanted ports are usually placed in the chest area, but they can also be placed in the upper arm, the abdomen, or the leg. An implanted port has two main parts:  Reservoir. The reservoir is round and will appear as a small, raised area under your skin. The reservoir is the part where a needle is inserted to give medicines or draw blood.  Catheter. The catheter is a thin, flexible tube that extends from the reservoir. The catheter is placed into a large vein. Medicine that is inserted into the reservoir goes into the catheter and then into the vein.  How will I care for my incision site? Do not get the incision site wet. Bathe or shower as directed by your health care provider. How is my port accessed? Special steps must be taken to access the port:  Before the port is accessed, a numbing cream can be placed on the skin. This helps numb the skin over the port site.  Your health care provider uses a sterile technique to access the port. ? Your health care provider must put on a mask and sterile gloves. ? The skin over your port is cleaned carefully with an antiseptic and allowed to dry. ? The port is gently pinched between sterile gloves, and a needle is inserted into the port.  Only "non-coring" port needles should be used to access the port. Once the port is accessed, a blood return should be checked. This helps ensure that the port  is in the vein and is not clogged.  If your port needs to remain accessed for a constant infusion, a clear (transparent) bandage will be placed over the needle site. The bandage and needle will need to be changed every week, or as directed by your health care provider.  Keep the bandage covering the needle clean and dry. Do not get it wet. Follow your health care provider's instructions on how to take a shower or bath while the port is accessed.  If your port does not need to stay accessed, no bandage is needed over the port.  What is flushing? Flushing helps keep the port from getting clogged. Follow your health care provider's instructions on how and when to flush the port. Ports are usually flushed with saline solution or a medicine called heparin. The need for flushing will depend on how the port is used.  If the port is used for intermittent medicines or blood draws, the port will need to be flushed: ? After medicines have been given. ? After blood has been drawn. ? As part of routine maintenance.  If a constant infusion is running, the port may not need to be flushed.  How long will my port stay implanted? The port can stay in for as long as your health care provider thinks it is needed. When it is time for the port to come out, surgery will be   done to remove it. The procedure is similar to the one performed when the port was put in. When should I seek immediate medical care? When you have an implanted port, you should seek immediate medical care if:  You notice a bad smell coming from the incision site.  You have swelling, redness, or drainage at the incision site.  You have more swelling or pain at the port site or the surrounding area.  You have a fever that is not controlled with medicine.  This information is not intended to replace advice given to you by your health care provider. Make sure you discuss any questions you have with your health care provider. Document  Released: 06/03/2005 Document Revised: 11/09/2015 Document Reviewed: 02/08/2013 [employer] Interactive Patient Education  2017 [employer]  

## 2016-10-01 ENCOUNTER — Telehealth: Payer: Self-pay | Admitting: Hematology

## 2016-10-01 NOTE — Telephone Encounter (Signed)
Patient bypassed scheduling on 09/30/16. Will get updated appointment schedule at next visit.

## 2016-10-07 ENCOUNTER — Other Ambulatory Visit: Payer: Self-pay | Admitting: Hematology

## 2016-10-07 ENCOUNTER — Other Ambulatory Visit (HOSPITAL_BASED_OUTPATIENT_CLINIC_OR_DEPARTMENT_OTHER): Payer: Medicare HMO

## 2016-10-07 ENCOUNTER — Ambulatory Visit (HOSPITAL_BASED_OUTPATIENT_CLINIC_OR_DEPARTMENT_OTHER): Payer: Medicare HMO

## 2016-10-07 VITALS — BP 160/83 | HR 67 | Temp 97.9°F | Resp 16

## 2016-10-07 DIAGNOSIS — C50911 Malignant neoplasm of unspecified site of right female breast: Secondary | ICD-10-CM

## 2016-10-07 DIAGNOSIS — Z452 Encounter for adjustment and management of vascular access device: Secondary | ICD-10-CM | POA: Diagnosis not present

## 2016-10-07 DIAGNOSIS — Z95828 Presence of other vascular implants and grafts: Secondary | ICD-10-CM

## 2016-10-07 LAB — CBC WITH DIFFERENTIAL/PLATELET
BASO%: 3.2 % — ABNORMAL HIGH (ref 0.0–2.0)
Basophils Absolute: 0 10e3/uL (ref 0.0–0.1)
EOS%: 4.6 % (ref 0.0–7.0)
Eosinophils Absolute: 0.1 10e3/uL (ref 0.0–0.5)
HCT: 28.9 % — ABNORMAL LOW (ref 34.8–46.6)
HGB: 9.7 g/dL — ABNORMAL LOW (ref 11.6–15.9)
LYMPH%: 21.6 % (ref 14.0–49.7)
MCH: 31.7 pg (ref 25.1–34.0)
MCHC: 33.7 g/dL (ref 31.5–36.0)
MCV: 94.1 fL (ref 79.5–101.0)
MONO#: 0.5 10e3/uL (ref 0.1–0.9)
MONO%: 35.3 % — ABNORMAL HIGH (ref 0.0–14.0)
NEUT#: 0.5 10e3/uL — CL (ref 1.5–6.5)
NEUT%: 35.3 % — ABNORMAL LOW (ref 38.4–76.8)
Platelets: 259 10e3/uL (ref 145–400)
RBC: 3.07 10e6/uL — ABNORMAL LOW (ref 3.70–5.45)
RDW: 17.1 % — ABNORMAL HIGH (ref 11.2–14.5)
WBC: 1.4 10e3/uL — ABNORMAL LOW (ref 3.9–10.3)
lymph#: 0.3 10e3/uL — ABNORMAL LOW (ref 0.9–3.3)

## 2016-10-07 LAB — COMPREHENSIVE METABOLIC PANEL WITH GFR
ALT: 12 U/L (ref 0–55)
AST: 14 U/L (ref 5–34)
Albumin: 3.9 g/dL (ref 3.5–5.0)
Alkaline Phosphatase: 69 U/L (ref 40–150)
Anion Gap: 10 meq/L (ref 3–11)
BUN: 23.7 mg/dL (ref 7.0–26.0)
CO2: 25 meq/L (ref 22–29)
Calcium: 9.3 mg/dL (ref 8.4–10.4)
Chloride: 107 meq/L (ref 98–109)
Creatinine: 1.1 mg/dL (ref 0.6–1.1)
EGFR: 62 ml/min/1.73 m2 — ABNORMAL LOW
Glucose: 93 mg/dL (ref 70–140)
Potassium: 3.9 meq/L (ref 3.5–5.1)
Sodium: 142 meq/L (ref 136–145)
Total Bilirubin: 0.47 mg/dL (ref 0.20–1.20)
Total Protein: 7.6 g/dL (ref 6.4–8.3)

## 2016-10-07 MED ORDER — SODIUM CHLORIDE 0.9% FLUSH
10.0000 mL | INTRAVENOUS | Status: DC | PRN
  Administered 2016-10-07: 10 mL via INTRAVENOUS
  Filled 2016-10-07: qty 10

## 2016-10-07 MED ORDER — HEPARIN SOD (PORK) LOCK FLUSH 100 UNIT/ML IV SOLN
500.0000 [IU] | Freq: Once | INTRAVENOUS | Status: AC | PRN
  Administered 2016-10-07: 500 [IU] via INTRAVENOUS
  Filled 2016-10-07: qty 5

## 2016-10-07 NOTE — Patient Instructions (Signed)
[facility] Discharge Instructions for Patients Receiving Chemotherapy  Today you received the following chemotherapy agents gemzar/carboplatin  To help prevent nausea and vomiting after your treatment, we encourage you to take your nausea medication as directed   If you develop nausea and vomiting that is not controlled by your nausea medication, call the clinic.   BELOW ARE SYMPTOMS THAT SHOULD BE REPORTED IMMEDIATELY:  *FEVER GREATER THAN 100.5 F  *CHILLS WITH OR WITHOUT FEVER  NAUSEA AND VOMITING THAT IS NOT CONTROLLED WITH YOUR NAUSEA MEDICATION  *UNUSUAL SHORTNESS OF BREATH  *UNUSUAL BRUISING OR BLEEDING  TENDERNESS IN MOUTH AND THROAT WITH OR WITHOUT PRESENCE OF ULCERS  *URINARY PROBLEMS  *BOWEL PROBLEMS  UNUSUAL RASH Items with * indicate a potential emergency and should be followed up as soon as possible.  Feel free to call the clinic you have any questions or concerns. The clinic phone number is [phone].  

## 2016-10-07 NOTE — Progress Notes (Signed)
Labs reviewed with Dr. [name].  ANC 0.5, day 1 cycle 2 gemzar/carbo.  No treatment today.  Per Dr. [name], pt to return in a couple days for granix injection, unable to get approved today.  Dr. [name] to place LOS.  Reviewed with pt, pt verbalized understanding.

## 2016-10-08 LAB — CANCER ANTIGEN 27.29: CA 27.29: 109.6 U/mL — ABNORMAL HIGH (ref 0.0–38.6)

## 2016-10-10 ENCOUNTER — Ambulatory Visit (HOSPITAL_BASED_OUTPATIENT_CLINIC_OR_DEPARTMENT_OTHER): Payer: Medicare HMO

## 2016-10-10 VITALS — BP 160/74 | HR 82 | Temp 98.0°F | Resp 18

## 2016-10-10 DIAGNOSIS — D701 Agranulocytosis secondary to cancer chemotherapy: Secondary | ICD-10-CM | POA: Diagnosis not present

## 2016-10-10 DIAGNOSIS — C50911 Malignant neoplasm of unspecified site of right female breast: Secondary | ICD-10-CM

## 2016-10-10 DIAGNOSIS — Z95828 Presence of other vascular implants and grafts: Secondary | ICD-10-CM

## 2016-10-10 MED ORDER — ALTEPLASE 2 MG IJ SOLR
2.0000 mg | Freq: Once | INTRAMUSCULAR | Status: DC | PRN
  Filled 2016-10-10: qty 2

## 2016-10-10 MED ORDER — TBO-FILGRASTIM 480 MCG/0.8ML ~~LOC~~ SOSY
480.0000 ug | PREFILLED_SYRINGE | Freq: Once | SUBCUTANEOUS | Status: AC
  Administered 2016-10-10: 480 ug via SUBCUTANEOUS
  Filled 2016-10-10: qty 0.8

## 2016-10-10 NOTE — Patient Instructions (Signed)
Tbo-Filgrastim injection What is this medicine? TBO-FILGRASTIM (T B O fil GRA stim) is a granulocyte colony-stimulating factor that stimulates the growth of neutrophils, a type of white blood cell important in the body's fight against infection. It is used to reduce the incidence of fever and infection in patients with certain types of cancer who are receiving chemotherapy that affects the bone marrow. This medicine may be used for other purposes; ask your health care provider or pharmacist if you have questions. COMMON BRAND NAME(S): Granix What should I tell my health care provider before I take this medicine? They need to know if you have any of these conditions: -bone scan or tests planned -kidney disease -sickle cell anemia -an unusual or allergic reaction to tbo-filgrastim, filgrastim, pegfilgrastim, other medicines, foods, dyes, or preservatives -pregnant or trying to get pregnant -breast-feeding How should I use this medicine? This medicine is for injection under the skin. If you get this medicine at home, you will be taught how to prepare and give this medicine. Refer to the Instructions for Use that come with your medication packaging. Use exactly as directed. Take your medicine at regular intervals. Do not take your medicine more often than directed. It is important that you put your used needles and syringes in a special sharps container. Do not put them in a trash can. If you do not have a sharps container, call your pharmacist or healthcare provider to get one. Talk to your pediatrician regarding the use of this medicine in children. Special care may be needed. Overdosage: If you think you have taken too much of this medicine contact a poison control center or emergency room at once. NOTE: This medicine is only for you. Do not share this medicine with others. What if I miss a dose? It is important not to miss your dose. Call your doctor or health care professional if you miss a  dose. What may interact with this medicine? This medicine may interact with the following medications: -medicines that may cause a release of neutrophils, such as lithium This list may not describe all possible interactions. Give your health care provider a list of all the medicines, herbs, non-prescription drugs, or dietary supplements you use. Also tell them if you smoke, drink alcohol, or use illegal drugs. Some items may interact with your medicine. What should I watch for while using this medicine? You may need blood work done while you are taking this medicine. What side effects may I notice from receiving this medicine? Side effects that you should report to your doctor or health care professional as soon as possible: -allergic reactions like skin rash, itching or hives, swelling of the face, lips, or tongue -blood in the urine -dark urine -dizziness -fast heartbeat -feeling faint -shortness of breath or breathing problems -signs and symptoms of infection like fever or chills; cough; or sore throat -signs and symptoms of kidney injury like trouble passing urine or change in the amount of urine -stomach or side pain, or pain at the shoulder -sweating -swelling of the legs, ankles, or abdomen -tiredness Side effects that usually do not require medical attention (report to your doctor or health care professional if they continue or are bothersome): -bone pain -headache -muscle pain -vomiting This list may not describe all possible side effects. Call your doctor for medical advice about side effects. You may report side effects to FDA at 1-800-FDA-1088. Where should I keep my medicine? Keep out of the reach of children. Store in a refrigerator between   2 and 8 degrees C (36 and 46 degrees F). Keep in carton to protect from light. Throw away this medicine if it is left out of the refrigerator for more than 5 consecutive days. Throw away any unused medicine after the expiration  date. NOTE: This sheet is a summary. It may not cover all possible information. If you have questions about this medicine, talk to your doctor, pharmacist, or health care provider.  2018 [employer] Standard (2015-07-24 19:07:04)  

## 2016-10-14 ENCOUNTER — Ambulatory Visit: Payer: Medicare HMO | Admitting: Hematology

## 2016-10-14 ENCOUNTER — Ambulatory Visit: Payer: Medicare HMO

## 2016-10-14 ENCOUNTER — Other Ambulatory Visit: Payer: Medicare HMO

## 2016-10-15 ENCOUNTER — Telehealth: Payer: Self-pay | Admitting: Hematology

## 2016-10-15 ENCOUNTER — Ambulatory Visit: Payer: Medicare HMO

## 2016-10-15 NOTE — Telephone Encounter (Signed)
Per 4/30 schedule message from switchboard patient unable to keep 4/30 appointments due to transportation didn't show up and needs to reschedule. Added treatment to next f/u which is 5/7. Desk nurse aware - infusion at capacity this week. Left message for patient and mailed schedule.

## 2016-10-18 NOTE — Progress Notes (Signed)
[place]  [phone] [phone]  Clinic Follow Up Note   Patient Care Team: [name], MD as PCP - General (Internal Medicine) 10/21/2016   CHIEF COMPLAINTS:  Follow up right breast cancer   Oncology History   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 [facility])   Staging form: Breast, AJCC 7th Edition   - Clinical stage from 11/30/2015: Stage IV (T4d, N3c, M1) - Signed by Truitt Merle, MD on 01/05/2016      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 [facility])   11/30/2015 Initial Diagnosis    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 [facility])      11/30/2015 Initial Biopsy    Right axilla lymph node biopsy showed metastatic poorly differentiated adenocarcinoma, IHC positive for CK AE1/AE3, CK7, E-cadherin, CDX-2, and focally very weak to equivocal staining for ER      12/21/2015 Imaging    Bilateral breast MRI showed extensive non-mass enhancement involving the entire right breast, right axillary, supraclavicular, and internal mammary adenopathy, new left axillary adenopathy      12/27/2015 Mammogram    Diagnostic mammogram and ultrasound of right breast and axilla showed diffuse skin thickening and increased density, no focal mass, numerous enlarged right axillary lymph node, the largest measuring 5.3cm.       12/27/2015 Initial Biopsy    Right breast core needle biopsy showed invasive ductal carcinoma, and DCIS, lymphovascular invasion identified      12/27/2015 Receptors her2    ER negative, PR negative, Ki-67 70%, HER-2 negative      01/03/2016 Imaging    PET scan showed diffuse asymmetric right breast hypermetabolic soft tissue density and skin thickening, hypermetabolic metastatic lymphadenopathy in bilateral axillary and subpectoral regions, right supraclavicular region and the right cervical level 5      01/10/2016 - 05/28/2016 Chemotherapy    Weekly Taxol 80 mg/m, second cycle was  postponed to 8/22 due to pt's non-compliance, stopped due to disease progression. She developed mild peripheral neuropathy in early Dec 2017.      02/07/2016 Pathology Results    Left axillary lymph node biopsy showed metastatic poorly differentiated carcinoma, triple negative.      04/24/2016 Imaging    CT CHEST, ABDOMEN AND PELVIS WITH CONTRAST IMPRESSION: 1. Bulky right axillary and mild right retropectoral and right supraclavicular lymphadenopathy is mildly decreased since 01/03/2016 PET-CT. 2. Left axillary lymphadenopathy is mildly increased. 3. Diffuse asymmetric thickening of the skin and fibroglandular soft tissues of the right breast is not appreciably changed. 4. No definite additional sites of metastatic disease in the chest, abdomen or pelvis. 5. Subcentimeter right liver lobe lesion is too small to characterize and is probably stable. 6. Additional findings include aortic atherosclerosis, 1 vessel coronary atherosclerosis, tiny hiatal hernia and myomatous uterus.      06/05/2016 Imaging    PET scan showed progressive right breast mass, extending in size and metabolic activity. Enlarging bilateral axillary adenopathy likewise in increased activity. A right supraclavicular lymph node is slightly smaller than before, no other new metastasis.      06/21/2016 - 08/12/2016 Chemotherapy    Adriamycin 60 mg/m, and Cytoxan 600 mg/m, every 2-3 weeks, for a total of 4 cycles.       08/23/2016 PET scan    1. Response to therapy. Significant decrease in size and hypermetabolism of right breast mass and bilateral axillary nodal metastasis. Hypermetabolism within a right supraclavicular node has resolved.  2. New soft tissue fullness and hypermetabolism within the lateral left breast, suspicious for either metastatic disease or new primary. Mastitis could look similar. 3. No extrathoracic metastatic disease identified. 4.  Coronary artery atherosclerosis. Aortic atherosclerosis. 5.  Diffuse marrow hypermetabolism is likely related to stimulation by chemotherapy. This decreases sensitivity for osseous metastasis.      09/09/2016 -  Chemotherapy    Carboplatin AUC 2, and gemcitabine 1000 mg/m, on day 1 and 8 every 21 days, dose reduced from cycle 2 due to significant neutropenia and thrombocytopenia. Neulasta added on day 9 from cycle 2         HISTORY OF PRESENTING ILLNESS (12/14/2015) :  [name] 71 y.o. 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 She is accompanied by her husband to the clinic today.  She noticed a lump at right axilla in 08/2015, no pain or other complains, she also report right breast swelling, no pain, skin erythema, nipple discharge or other complain. She otherwise feels well. She was initially seen at urgent care in March, then was referred to establish her care with prior care physician at [facility] internal medicine center. Her screening mammogram was negative in January 2017. She subsequently underwent CT chest on 09/27/2015 which showed enlarged and inflamed right breast, bulky right axillary lymph nodes. She was referred to breast Center for diagnostic mammogram and ultrasound of right breast, which was negative. She underwent ultrasound guided right axillary node biopsy on 12/05/2015, which reviewed port differentiated carcinoma. ER weakly focally positive.  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 She works independently. She lives with her husband. She denies any fever, night sweats, chills, or skin itchiness.  CURRENT THERAPY: third line chemo carboplatin AUC 2 and gemcitabine 10060mm2  weekly, 2 weeks on and one week off, started on 09/09/16, dose reduction from cycle 2 due to significant neutropenia and thrombocytopenia, Neulasta added on day 9 from cycle 2.   INTERIM HISTORY:  [facility] for follow up. She presents to the clinic today with her husband. She reports she has been eating well. She received her Granix injection last week, and tolerated it well. She has new pain in left breast and her right breast feels hard again. No other pain was reported.   . Marland Kitchen MEDICAL HISTORY:  Past Medical History:  Diagnosis Date  . Arthritis   . GERD (gastroesophageal reflux disease)   . Hypertension   . Shortness of breath   . Sleep apnea   [facility]    SURGICAL HISTORY: Past Surgical History:  Procedure Laterality Date  . IR GENERIC HISTORICAL  06/13/2016   IR [place] VASC ACCESS LEFT 06/13/2016 JaCorrie MckusickDO [facility]INTER[facility]  . IR GENERIC HISTORICAL  06/13/2016   IR FLUORO GUIDE PORT INSERTION LEFT 06/13/2016 JaCorrie MckusickDO [facility]INTER[facility]  . NO PAST SURGERIES      SOCIAL HISTORY: Social History   Social History  . Marital status: Married    Spouse name: N/A  . Number of children: 4  . Years of education: 8 TH   Occupational History  .  Not on file.   Social History Main Topics  . Smoking status: Former Smoker    Packs/day: 0.50    Years: 1.00    Types: Cigarettes    Start date: 06/17/1968    Quit date: 06/17/1973  . Smokeless tobacco: Never Used     Comment: Quit in 37s  . Alcohol use No     Comment: former drinker for approximately 10 years  . Drug use: No  . Sexual activity: Not on file   Other Topics Concern  . Not on file   Social History Narrative   Patient is married with 3 children still living and 1 deceased son.   Patient is right handed.   Patient has 8 th grade education.   Patient drinks 2-3 servings daily.    FAMILY HISTORY: Family History  Problem Relation Age of Onset  . Heart disease Mother   . Cancer Father 78     throat cancer  . Cancer Cousin     breast cancer    ALLERGIES:  has No Known Allergies.  MEDICATIONS:  Current Outpatient Prescriptions  Medication Sig Dispense Refill  . amLODipine (NORVASC) 10 MG tablet Take 1 tablet (10 mg total) by mouth daily. 90 tablet 3  . aspirin EC 81 MG tablet Take 1 tablet (81 mg total) by mouth daily. 90 tablet 3  . atorvastatin (LIPITOR) 20 MG tablet TAKE 1 TABLET EVERY DAY (Patient taking differently: Take 20 mg by mouth daily) 90 tablet 3  . lidocaine-prilocaine (EMLA) cream Apply to port @ 1 hour prior to treatment. 30 g 1  . metoprolol tartrate (LOPRESSOR) 25 MG tablet TAKE 1 TABLET EVERY DAY 90 tablet 3  . ondansetron (ZOFRAN) 8 MG tablet Take by mouth every 8 (eight) hours as needed for nausea or vomiting.    . prochlorperazine (COMPAZINE) 10 MG tablet Take 10 mg by mouth every 6 (six) hours as needed for nausea or vomiting.    . traMADol (ULTRAM) 50 MG tablet Take 1 tablet (50 mg total) by mouth every 8 (eight) hours as needed. 60 tablet 1   No current facility-administered medications for this visit.    Facility-Administered Medications Ordered in Other Visits  Medication Dose Route Frequency Provider Last Rate Last Dose  . sodium chloride flush (NS) 0.9 % injection 10 mL  10 mL Intravenous PRN Truitt Merle, MD   10 mL at 08/12/16 1754       REVIEW OF SYSTEMS:  Constitutional: Denies fevers, chills or abnormal night sweats  Eyes: Denies blurriness of vision, double vision or watery eyes Ears, nose, mouth, throat, and face: Denies mucositis or sore throat Respiratory: Denies cough, dyspnea or wheezes Cardiovascular: Denies palpitation, chest discomfort (+) leg swelling  Gastrointestinal:  Denies nausea, heartburn or change in bowel habits (+) constipation Skin: no skin rashes.  Lymphatics: Denies new lymphadenopathy or easy bruising Neurological: Negative Behavioral/Psych: Mood is stable, no new changes (+) numbness and tingling hands and feet,  overall much improved  Breast: (+) pain in left breast (+) right breast is hardened after holding chemo All other systems were reviewed with the patient and are negative.  PHYSICAL EXAMINATION: ECOG PERFORMANCE STATUS: 1  Vitals:   10/21/16 1353  BP: (!) 166/87  Pulse: 76  Resp: 20  Temp: 98.7 F (37.1 C)   Filed Weights   10/21/16 1353  Weight: 181 lb 1.6 oz (82.1 kg)    GENERAL:alert, no distress and comfortable SKIN: skin color, texture, turgor are normal, no rashes  or significant lesions EYES: normal, conjunctiva are pink and non-injected, sclera clear OROPHARYNX:no exudate, no erythema and lips, buccal mucosa, and tongue normal  NECK: supple, thyroid normal size, non-tender, without nodularity LYMPH:  no palpable lymphadenopathy in the cervical, axillary or inguinal LUNGS: clear to auscultation and percussion with normal breathing effort HEART: regular rate & rhythm and no murmurs and no lower extremity edema ABDOMEN:abdomen soft, non-tender and normal bowel sounds Musculoskeletal:no cyanosis of digits and no clubbing  PSYCH: alert & oriented x 3 with fluent speech NEURO: Decreased vibration sensation on bilateral ankle and wrist. SKIN: (+) Dry skin of the bilateral feet, no sign of infection. EXTREMITIES: Bilateral hand and lower extremity edema. Breasts: her entire right breast is firm., with associated skin erythema and edema which appears slightly improved overall. There is a 3 x 4 cm lymph node at the anterior right axilla, close to right chest wall of breast margin, no tenderness, movable. Left axilla moveable lymph node is not palpable anymore. There is a small orange size mass in left breast, firm, and mildly tender.   LABORATORY DATA:  I have reviewed the data as listed CBC Latest Ref Rng & Units 10/21/2016 10/07/2016 09/30/2016  WBC 3.9 - 10.3 10e3/uL 3.4(L) 1.4(L) 1.2(L)  Hemoglobin 11.6 - 15.9 g/dL 9.9(L) 9.7(L) 8.5(L)  Hematocrit 34.8 - 46.6 % 30.4(L) 28.9(L)  25.7(L)  Platelets 145 - 400 10e3/uL 194 259 101(L)   CMP Latest Ref Rng & Units 10/21/2016 10/07/2016 09/30/2016  Glucose 70 - 140 mg/dl 88 93 95  BUN 7.0 - 26.0 mg/dL 25.5 23.7 26.2(H)  Creatinine 0.6 - 1.1 mg/dL 1.0 1.1 1.2(H)  Sodium 136 - 145 mEq/L 142 142 142  Potassium 3.5 - 5.1 mEq/L 3.9 3.9 4.2  Chloride 101 - 111 mmol/L - - -  CO2 22 - 29 mEq/L 26 25 26   Calcium 8.4 - 10.4 mg/dL 9.3 9.3 8.9  Total Protein 6.4 - 8.3 g/dL 7.7 7.6 7.0  Total Bilirubin 0.20 - 1.20 mg/dL 0.40 0.47 0.39  Alkaline Phos 40 - 150 U/L 69 69 59  AST 5 - 34 U/L 16 14 15   ALT 0 - 55 U/L 18 12 14    CA27.29: 01/05/2016: 155.9 02/27/2016: 96.7 03/26/2016: 64.7 04/23/2016: 65.4 05/21/2016: 99.2 07/15/2016: 215.4  08/12/2016: 176.9 09/09/2016: 112 10/07/16: 109.6    PATHOLOGY REPORT: Diagnosis 11/30/2015 Lymph node, needle/core biopsy, right breast/axilla - LYMPH NODE POSITIVE FOR METASTATIC POORLY DIFFERENTIATED CARCINOMA. - SEE COMMENT. Microscopic Comment Immunohistochemical stains are performed. The tumor is positive for cytokeratin AE1/AE3, cytokeratin 7 and E-cadherin. CDX-2 demonstrates weak positivity. Estrogen receptor demonstrates focal very weak to equivocal staining. Gross cystic disease fluid protein, cytokeratin 20, TTF-1, Melan-A and S100 are all negative. The findings are consistent with a poorly differentiated carcinoma. Given the staining pattern, an upper gastrointestinal and pancreatobiliary primary source should be ruled out. In addition, given the axillary location of the biopsy and focal weak to equivocal estrogen receptor staining, ruling out a poorly differentiated carcinoma of breast primary source is also prudent. Lastly, a gynecologic primary may be considered. Correlation with clinical and radiologic impression is essential. Dr. Vicente Males has seen this case in consultation with essential agreement of the above diagnosis and comment. The findings are called to the [place] on 12/04/15. (RAH:gt, 12/04/15)  Diagnosis 12/27/2015 Breast, right, needle core biopsy, central - INVASIVE MAMMARY CARCINOMA. - MAMMARY CARCINOMA IN SITU. - LYMPH/VASCULAR INVASION IS IDENTIFIED. - SEE COMMENT. Microscopic Comment An E-cadherin stain will be performed  to determine if the carcinoma is ductal or lobular in nature. The results of the stain will be reported in an addendum to follow. Although definitive grading of breast carcinoma is best done on excision, the features of the invasive tumor from the right central breast biopsy are compatible with a grade 3 breast carcinoma. Breast prognostic markers will be performed and reported in an addendum. Findings are called to the [place] on 12/28/2015. Dr. Lyndon Code has seen this case in consultation with agreement. (RH:kh 12-28-15) ADDENDUM: An E-cadherin immunohistochemical stain is performed which is positive in both the invasive and in situ components confirming the ductal nature of both (i.e. invasive ductal carcinoma with ductal carcinoma in situ). (RAH:gt, 12/29/15) PROGNOSTIC INDICATORS Results: IMMUNOHISTOCHEMICAL AND MORPHOMETRIC ANALYSIS PERFORMED MANUALLY Estrogen Receptor: 0%, NEGATIVE Progesterone Receptor: 0%, NEGATIVE Proliferation Marker Ki67: 70% Results: HER2 - NEGATIVE RATIO OF HER2/CEP17 SIGNALS 1.13 AVERAGE HER2 COPY NUMBER PER CELL 1.75  Diagnosis 02/07/2016 Lymph node, needle/core biopsy, left axillary - METASTATIC POORLY DIFFERENTIATED CARCINOMA. Results: IMMUNOHISTOCHEMICAL AND MORPHOMETRIC ANALYSIS PERFORMED MANUALLY Estrogen Receptor: 0%, NEGATIVE Progesterone Receptor: 0%, NEGATIVE Results: HER2 - NEGATIVE RATIO OF HER2/CEP17 SIGNALS 1.21 AVERAGE HER2 COPY NUMBER PER CELL 2.05  RADIOGRAPHIC STUDIES: I have personally reviewed the radiological images as listed and agreed with the findings in the report.  PET 08/23/2016 IMPRESSION: 1. Response to therapy. Significant  decrease in size and hypermetabolism of right breast mass and bilateral axillary nodal metastasis. Hypermetabolism within a right supraclavicular node has resolved. 2. New soft tissue fullness and hypermetabolism within the lateral left breast, suspicious for either metastatic disease or new primary. Mastitis could look similar. 3. No extrathoracic metastatic disease identified. 4.  Coronary artery atherosclerosis. Aortic atherosclerosis. 5. Diffuse marrow hypermetabolism is likely related to stimulation by chemotherapy. This decreases sensitivity for osseous metastasis.  PET 06/05/2016 IMPRESSION: 1. Progressive right breast mass, expanding in size and metabolic activity. Enlarging but bilateral axillary adenopathy likewise increased activity. A right supraclavicular lymph node is slightly smaller than previous but has a higher standard uptake value. Overall appearance compatible with progression but no further new metastatic spread compared to 01/03/2016. 2. Coronary, aortic arch, and branch vessel atherosclerotic vascular disease. Aortoiliac atherosclerotic vascular disease. 3. Extensive edema along the anterior chest wall especially both breasts, right greater than left.  ASSESSMENT & PLAN:  71 y.o. female, with past medical history of HTN, GERD, sleep apnea, presented with bulky right axillary adenopathy.  1. Primary cancer of right breast with metastasis to distant lymph nodes, invasive ductal carcinoma and DCIS, ER-/PR-/HER2-,  [id number], stage IV  -I previously reviewed her mammogram, ultrasound and a CT chest, and biopsy pathology findings with patient and her husband in details -I previously discussed her breast MRI, PET scan as this is abiopsy results in great details with patient and her family members  -Her breast MRI findings and breast biopsy confirmed primary breast cancer, triple negative. Unfortunately her cancer has metastasized to left axilla and cervical lymph  nodes, which are distant metastasis.  -We previously reviewed the natural history of metastatic triple negative breast cancer, which is very aggressive, and her cancer is incurable at this stage  -Giving the metastatic disease, surgery up front is not indicated, I recommended systemic chemotherapy. --She is now on third line chemotherapy with carboplatin and gemcitabine, tolerated well, however she has developed a severe neutropenia and thrombocytopenia, and prolonged recovery.  -Labs previously reviewed, adequate for treatment, will start cycle 2 chemo with dose reduction  -I will add  Neulasta injection on day 9 from next cycle chemotherapy.  -Plan to repeat PET scan after next cycle chemotherapy -she now has a palpable large left breast mass, I recommend needle biopsy, to see if this is a different type breast cancer, she agrees.    2. Left Breast nodule -possibly second breast cancer or metastatic lesion from right breast cancer -Will do a left breast US and biopsy to further investigate mass in left breast.   3. ANEMIA in neoplastic disease -She previously developed normocytic mild anemia in the past few months. -Her anemia workup previously showed a normal folic acid and Q25 level, increased ferritin, decreased serum iron, TIBC and transferrin saturation. This is most consistent with anemia of chronic disease, secondary to her CKD and underlying malignancy.  -I previously encouraged her to continue oral iron supplement  4. Neutropenia and thrombocytopenia from chemotherapy -She had nadir plt 26K and ANC 0.4K after first cycle chemotherapy -Her blood counts are recovering -We previously reduced her chemotherapy dose the following cycle  5. CKD stage III -Her GFR was previously in 40-50's  -No lab signs of tumor lysis -Possibly related to her underlying hypertension and diuretics  -Her Cr has previously much improved since she stopped her Lasix. -Avoid nephrotoxins, such as NSAIDs and  IV CT contrast  6. HTN, GERD, obesity, sleep apnea -She'll continue follow-up with her primary care physician  7. Right breast pain -secondary to breast cancer, previously much improved after chemo  -continue tramadol as needed  -She now feels hardening in right breast but no pain.   8. Goal of care discussion  -We previously discussed the incurable nature of her cancer, and the overall poor prognosis, especially if she does not have good response to chemotherapy or progress on chemo -The patient understands the goal of care is palliative. -I previously recommend DNR/DNI, she will think about it   PLAN -labs reviewed and adequate for treatment, will proceed C2D1 carbo and gemcitabine today, dose reduce to AUC 1.5 and 875m/m2, with granix tomorrow, and neulasta on day 9 -Lab, flush, and chemo carbo and gemcitabine in 1, 3 and 4 weeks --f/u in 3 weeks  -cancel 5/21 appointment  -Left breast [place] biopsy this week    I spent 25 minutes counseling the patient face to face. The total time spent in the appointment was 30 minutes and more than 50% was on counseling.  This document serves as a record of services personally performed by YTruitt Merle MD. It was created on her behalf by AJoslyn Devon a trained medical scribe. The creation of this record is based on the scribe's personal observations and the provider's statements to them. This document has been checked and approved by the attending provider.   I have reviewed the above documentation for accuracy and completeness and I agree with the above.    FTruitt Merle MD 10/21/2016

## 2016-10-21 ENCOUNTER — Ambulatory Visit (HOSPITAL_BASED_OUTPATIENT_CLINIC_OR_DEPARTMENT_OTHER): Payer: Medicare HMO

## 2016-10-21 ENCOUNTER — Ambulatory Visit (HOSPITAL_BASED_OUTPATIENT_CLINIC_OR_DEPARTMENT_OTHER): Payer: Medicare HMO | Admitting: Hematology

## 2016-10-21 ENCOUNTER — Other Ambulatory Visit (HOSPITAL_BASED_OUTPATIENT_CLINIC_OR_DEPARTMENT_OTHER): Payer: Medicare HMO

## 2016-10-21 ENCOUNTER — Ambulatory Visit: Payer: Medicare HMO

## 2016-10-21 VITALS — BP 166/87 | HR 76 | Temp 98.7°F | Resp 20 | Ht 64.0 in | Wt 181.1 lb

## 2016-10-21 DIAGNOSIS — D630 Anemia in neoplastic disease: Secondary | ICD-10-CM

## 2016-10-21 DIAGNOSIS — D6959 Other secondary thrombocytopenia: Secondary | ICD-10-CM

## 2016-10-21 DIAGNOSIS — E669 Obesity, unspecified: Secondary | ICD-10-CM | POA: Diagnosis not present

## 2016-10-21 DIAGNOSIS — D701 Agranulocytosis secondary to cancer chemotherapy: Secondary | ICD-10-CM

## 2016-10-21 DIAGNOSIS — Z5111 Encounter for antineoplastic chemotherapy: Secondary | ICD-10-CM | POA: Diagnosis not present

## 2016-10-21 DIAGNOSIS — C50811 Malignant neoplasm of overlapping sites of right female breast: Secondary | ICD-10-CM

## 2016-10-21 DIAGNOSIS — N183 Chronic kidney disease, stage 3 unspecified: Secondary | ICD-10-CM

## 2016-10-21 DIAGNOSIS — C50911 Malignant neoplasm of unspecified site of right female breast: Secondary | ICD-10-CM

## 2016-10-21 DIAGNOSIS — I10 Essential (primary) hypertension: Secondary | ICD-10-CM

## 2016-10-21 DIAGNOSIS — C773 Secondary and unspecified malignant neoplasm of axilla and upper limb lymph nodes: Secondary | ICD-10-CM | POA: Diagnosis not present

## 2016-10-21 DIAGNOSIS — Z171 Estrogen receptor negative status [ER-]: Secondary | ICD-10-CM

## 2016-10-21 DIAGNOSIS — G4733 Obstructive sleep apnea (adult) (pediatric): Secondary | ICD-10-CM

## 2016-10-21 LAB — COMPREHENSIVE METABOLIC PANEL
ALT: 18 U/L (ref 0–55)
ANION GAP: 9 meq/L (ref 3–11)
AST: 16 U/L (ref 5–34)
Albumin: 3.9 g/dL (ref 3.5–5.0)
Alkaline Phosphatase: 69 U/L (ref 40–150)
BUN: 25.5 mg/dL (ref 7.0–26.0)
CALCIUM: 9.3 mg/dL (ref 8.4–10.4)
CHLORIDE: 107 meq/L (ref 98–109)
CO2: 26 mEq/L (ref 22–29)
Creatinine: 1 mg/dL (ref 0.6–1.1)
EGFR: 68 mL/min/{1.73_m2} — AB (ref >90)
Glucose: 88 mg/dl (ref 70–140)
POTASSIUM: 3.9 meq/L (ref 3.5–5.1)
Sodium: 142 mEq/L (ref 136–145)
Total Bilirubin: 0.4 mg/dL (ref 0.20–1.20)
Total Protein: 7.7 g/dL (ref 6.4–8.3)

## 2016-10-21 LAB — CBC WITH DIFFERENTIAL/PLATELET
BASO%: 1.5 % (ref 0.0–2.0)
Basophils Absolute: 0.1 10*3/uL (ref 0.0–0.1)
EOS%: 2 % (ref 0.0–7.0)
Eosinophils Absolute: 0.1 10*3/uL (ref 0.0–0.5)
HCT: 30.4 % — ABNORMAL LOW (ref 34.8–46.6)
HGB: 9.9 g/dL — ABNORMAL LOW (ref 11.6–15.9)
LYMPH%: 12.2 % — AB (ref 14.0–49.7)
MCH: 30.1 pg (ref 25.1–34.0)
MCHC: 32.5 g/dL (ref 31.5–36.0)
MCV: 92.8 fL (ref 79.5–101.0)
MONO#: 0.6 10*3/uL (ref 0.1–0.9)
MONO%: 16.3 % — ABNORMAL HIGH (ref 0.0–14.0)
NEUT#: 2.3 10*3/uL (ref 1.5–6.5)
NEUT%: 68 % (ref 38.4–76.8)
PLATELETS: 194 10*3/uL (ref 145–400)
RBC: 3.27 10*6/uL — AB (ref 3.70–5.45)
RDW: 16.3 % — ABNORMAL HIGH (ref 11.2–14.5)
WBC: 3.4 10*3/uL — ABNORMAL LOW (ref 3.9–10.3)
lymph#: 0.4 10*3/uL — ABNORMAL LOW (ref 0.9–3.3)

## 2016-10-21 MED ORDER — SODIUM CHLORIDE 0.9% FLUSH
10.0000 mL | INTRAVENOUS | Status: DC | PRN
  Administered 2016-10-21: 10 mL
  Filled 2016-10-21: qty 10

## 2016-10-21 MED ORDER — SODIUM CHLORIDE 0.9 % IV SOLN
139.3500 mg | Freq: Once | INTRAVENOUS | Status: AC
  Administered 2016-10-21: 140 mg via INTRAVENOUS
  Filled 2016-10-21: qty 14

## 2016-10-21 MED ORDER — DEXAMETHASONE SODIUM PHOSPHATE 10 MG/ML IJ SOLN
10.0000 mg | Freq: Once | INTRAMUSCULAR | Status: AC
  Administered 2016-10-21: 10 mg via INTRAVENOUS

## 2016-10-21 MED ORDER — HEPARIN SOD (PORK) LOCK FLUSH 100 UNIT/ML IV SOLN
500.0000 [IU] | Freq: Once | INTRAVENOUS | Status: AC | PRN
  Administered 2016-10-21: 500 [IU]
  Filled 2016-10-21: qty 5

## 2016-10-21 MED ORDER — PALONOSETRON HCL INJECTION 0.25 MG/5ML
0.2500 mg | Freq: Once | INTRAVENOUS | Status: AC
  Administered 2016-10-21: 0.25 mg via INTRAVENOUS

## 2016-10-21 MED ORDER — DEXAMETHASONE SODIUM PHOSPHATE 10 MG/ML IJ SOLN
INTRAMUSCULAR | Status: AC
Start: 2016-10-21 — End: 2016-10-21
  Filled 2016-10-21: qty 1

## 2016-10-21 MED ORDER — PALONOSETRON HCL INJECTION 0.25 MG/5ML
INTRAVENOUS | Status: AC
  Filled 2016-10-21: qty 5

## 2016-10-21 MED ORDER — SODIUM CHLORIDE 0.9 % IV SOLN
Freq: Once | INTRAVENOUS | Status: AC
  Administered 2016-10-21: 16:00:00 via INTRAVENOUS

## 2016-10-21 MED ORDER — SODIUM CHLORIDE 0.9 % IV SOLN
800.0000 mg/m2 | Freq: Once | INTRAVENOUS | Status: AC
  Administered 2016-10-21: 1520 mg via INTRAVENOUS
  Filled 2016-10-21: qty 39.98

## 2016-10-21 NOTE — Patient Instructions (Signed)
[facility] Discharge Instructions for Patients Receiving Chemotherapy  Today you received the following chemotherapy agents gemzar/carboplatin  To help prevent nausea and vomiting after your treatment, we encourage you to take your nausea medication as directed   If you develop nausea and vomiting that is not controlled by your nausea medication, call the clinic.   BELOW ARE SYMPTOMS THAT SHOULD BE REPORTED IMMEDIATELY:  *FEVER GREATER THAN 100.5 F  *CHILLS WITH OR WITHOUT FEVER  NAUSEA AND VOMITING THAT IS NOT CONTROLLED WITH YOUR NAUSEA MEDICATION  *UNUSUAL SHORTNESS OF BREATH  *UNUSUAL BRUISING OR BLEEDING  TENDERNESS IN MOUTH AND THROAT WITH OR WITHOUT PRESENCE OF ULCERS  *URINARY PROBLEMS  *BOWEL PROBLEMS  UNUSUAL RASH Items with * indicate a potential emergency and should be followed up as soon as possible.  Feel free to call the clinic you have any questions or concerns. The clinic phone number is [phone].  

## 2016-10-22 ENCOUNTER — Ambulatory Visit (HOSPITAL_BASED_OUTPATIENT_CLINIC_OR_DEPARTMENT_OTHER): Payer: Medicare HMO

## 2016-10-22 ENCOUNTER — Encounter: Payer: Self-pay | Admitting: Hematology

## 2016-10-22 VITALS — BP 158/83 | HR 80 | Temp 97.4°F | Resp 18

## 2016-10-22 DIAGNOSIS — D701 Agranulocytosis secondary to cancer chemotherapy: Secondary | ICD-10-CM

## 2016-10-22 DIAGNOSIS — C50811 Malignant neoplasm of overlapping sites of right female breast: Secondary | ICD-10-CM | POA: Diagnosis not present

## 2016-10-22 DIAGNOSIS — Z95828 Presence of other vascular implants and grafts: Secondary | ICD-10-CM

## 2016-10-22 MED ORDER — TBO-FILGRASTIM 480 MCG/0.8ML ~~LOC~~ SOSY
480.0000 ug | PREFILLED_SYRINGE | Freq: Once | SUBCUTANEOUS | Status: AC
  Administered 2016-10-22: 480 ug via SUBCUTANEOUS
  Filled 2016-10-22: qty 0.8

## 2016-10-22 NOTE — Patient Instructions (Signed)
Tbo-Filgrastim injection What is this medicine? TBO-FILGRASTIM (T B O fil GRA stim) is a granulocyte colony-stimulating factor that stimulates the growth of neutrophils, a type of white blood cell important in the body's fight against infection. It is used to reduce the incidence of fever and infection in patients with certain types of cancer who are receiving chemotherapy that affects the bone marrow. This medicine may be used for other purposes; ask your health care provider or pharmacist if you have questions. COMMON BRAND NAME(S): Granix What should I tell my health care provider before I take this medicine? They need to know if you have any of these conditions: -bone scan or tests planned -kidney disease -sickle cell anemia -an unusual or allergic reaction to tbo-filgrastim, filgrastim, pegfilgrastim, other medicines, foods, dyes, or preservatives -pregnant or trying to get pregnant -breast-feeding How should I use this medicine? This medicine is for injection under the skin. If you get this medicine at home, you will be taught how to prepare and give this medicine. Refer to the Instructions for Use that come with your medication packaging. Use exactly as directed. Take your medicine at regular intervals. Do not take your medicine more often than directed. It is important that you put your used needles and syringes in a special sharps container. Do not put them in a trash can. If you do not have a sharps container, call your pharmacist or healthcare provider to get one. Talk to your pediatrician regarding the use of this medicine in children. Special care may be needed. Overdosage: If you think you have taken too much of this medicine contact a poison control center or emergency room at once. NOTE: This medicine is only for you. Do not share this medicine with others. What if I miss a dose? It is important not to miss your dose. Call your doctor or health care professional if you miss a  dose. What may interact with this medicine? This medicine may interact with the following medications: -medicines that may cause a release of neutrophils, such as lithium This list may not describe all possible interactions. Give your health care provider a list of all the medicines, herbs, non-prescription drugs, or dietary supplements you use. Also tell them if you smoke, drink alcohol, or use illegal drugs. Some items may interact with your medicine. What should I watch for while using this medicine? You may need blood work done while you are taking this medicine. What side effects may I notice from receiving this medicine? Side effects that you should report to your doctor or health care professional as soon as possible: -allergic reactions like skin rash, itching or hives, swelling of the face, lips, or tongue -blood in the urine -dark urine -dizziness -fast heartbeat -feeling faint -shortness of breath or breathing problems -signs and symptoms of infection like fever or chills; cough; or sore throat -signs and symptoms of kidney injury like trouble passing urine or change in the amount of urine -stomach or side pain, or pain at the shoulder -sweating -swelling of the legs, ankles, or abdomen -tiredness Side effects that usually do not require medical attention (report to your doctor or health care professional if they continue or are bothersome): -bone pain -headache -muscle pain -vomiting This list may not describe all possible side effects. Call your doctor for medical advice about side effects. You may report side effects to FDA at 1-800-FDA-1088. Where should I keep my medicine? Keep out of the reach of children. Store in a refrigerator between   2 and 8 degrees C (36 and 46 degrees F). Keep in carton to protect from light. Throw away this medicine if it is left out of the refrigerator for more than 5 consecutive days. Throw away any unused medicine after the expiration  date. NOTE: This sheet is a summary. It may not cover all possible information. If you have questions about this medicine, talk to your doctor, pharmacist, or health care provider.  2018 [employer] Standard (2015-07-24 19:07:04)  

## 2016-10-23 ENCOUNTER — Telehealth: Payer: Self-pay | Admitting: Hematology

## 2016-10-23 NOTE — Telephone Encounter (Signed)
Patient bypassed scheduling on 10/21/16.  Appointments scheduled per 10/21/16 los.  Left message on patient's voicemail with appointment details. A copy of the appointment schedule and letter was mailed to patient.

## 2016-10-28 ENCOUNTER — Ambulatory Visit (HOSPITAL_BASED_OUTPATIENT_CLINIC_OR_DEPARTMENT_OTHER): Payer: Medicare HMO

## 2016-10-28 ENCOUNTER — Other Ambulatory Visit: Payer: Medicare HMO

## 2016-10-28 ENCOUNTER — Other Ambulatory Visit (HOSPITAL_BASED_OUTPATIENT_CLINIC_OR_DEPARTMENT_OTHER): Payer: Medicare HMO

## 2016-10-28 ENCOUNTER — Ambulatory Visit: Payer: Medicare HMO

## 2016-10-28 ENCOUNTER — Ambulatory Visit: Payer: Medicare HMO | Admitting: Hematology

## 2016-10-28 VITALS — BP 152/87 | HR 67 | Temp 99.0°F | Resp 18

## 2016-10-28 DIAGNOSIS — Z5111 Encounter for antineoplastic chemotherapy: Secondary | ICD-10-CM

## 2016-10-28 DIAGNOSIS — Z95828 Presence of other vascular implants and grafts: Secondary | ICD-10-CM

## 2016-10-28 DIAGNOSIS — C50811 Malignant neoplasm of overlapping sites of right female breast: Secondary | ICD-10-CM

## 2016-10-28 DIAGNOSIS — C50911 Malignant neoplasm of unspecified site of right female breast: Secondary | ICD-10-CM

## 2016-10-28 LAB — CBC WITH DIFFERENTIAL/PLATELET
BASO%: 1.2 % (ref 0.0–2.0)
Basophils Absolute: 0 10*3/uL (ref 0.0–0.1)
EOS%: 2.1 % (ref 0.0–7.0)
Eosinophils Absolute: 0 10*3/uL (ref 0.0–0.5)
HEMATOCRIT: 27.4 % — AB (ref 34.8–46.6)
HGB: 8.9 g/dL — ABNORMAL LOW (ref 11.6–15.9)
LYMPH#: 0.3 10*3/uL — AB (ref 0.9–3.3)
LYMPH%: 11.3 % — ABNORMAL LOW (ref 14.0–49.7)
MCH: 29.9 pg (ref 25.1–34.0)
MCHC: 32.3 g/dL (ref 31.5–36.0)
MCV: 92.4 fL (ref 79.5–101.0)
MONO#: 0.2 10*3/uL (ref 0.1–0.9)
MONO%: 8.9 % (ref 0.0–14.0)
NEUT%: 76.5 % (ref 38.4–76.8)
NEUTROS ABS: 1.8 10*3/uL (ref 1.5–6.5)
Platelets: 166 10*3/uL (ref 145–400)
RBC: 2.96 10*6/uL — ABNORMAL LOW (ref 3.70–5.45)
RDW: 15.7 % — AB (ref 11.2–14.5)
WBC: 2.4 10*3/uL — AB (ref 3.9–10.3)

## 2016-10-28 LAB — COMPREHENSIVE METABOLIC PANEL
ALT: 19 U/L (ref 0–55)
AST: 15 U/L (ref 5–34)
Albumin: 3.7 g/dL (ref 3.5–5.0)
Alkaline Phosphatase: 70 U/L (ref 40–150)
Anion Gap: 7 mEq/L (ref 3–11)
BUN: 20.1 mg/dL (ref 7.0–26.0)
CALCIUM: 9.2 mg/dL (ref 8.4–10.4)
CHLORIDE: 109 meq/L (ref 98–109)
CO2: 26 meq/L (ref 22–29)
CREATININE: 1 mg/dL (ref 0.6–1.1)
EGFR: 68 mL/min/{1.73_m2} — ABNORMAL LOW (ref >90)
Glucose: 87 mg/dl (ref 70–140)
Potassium: 3.9 mEq/L (ref 3.5–5.1)
Sodium: 142 mEq/L (ref 136–145)
Total Bilirubin: 0.39 mg/dL (ref 0.20–1.20)
Total Protein: 7.5 g/dL (ref 6.4–8.3)

## 2016-10-28 MED ORDER — SODIUM CHLORIDE 0.9 % IV SOLN
Freq: Once | INTRAVENOUS | Status: AC
  Administered 2016-10-28: 15:00:00 via INTRAVENOUS

## 2016-10-28 MED ORDER — PALONOSETRON HCL INJECTION 0.25 MG/5ML
0.2500 mg | Freq: Once | INTRAVENOUS | Status: AC
  Administered 2016-10-28: 0.25 mg via INTRAVENOUS

## 2016-10-28 MED ORDER — DEXAMETHASONE SODIUM PHOSPHATE 10 MG/ML IJ SOLN
INTRAMUSCULAR | Status: AC
Start: 2016-10-28 — End: 2016-10-28
  Filled 2016-10-28: qty 1

## 2016-10-28 MED ORDER — DEXAMETHASONE SODIUM PHOSPHATE 10 MG/ML IJ SOLN
10.0000 mg | Freq: Once | INTRAMUSCULAR | Status: AC
  Administered 2016-10-28: 10 mg via INTRAVENOUS

## 2016-10-28 MED ORDER — SODIUM CHLORIDE 0.9 % IV SOLN
139.3500 mg | Freq: Once | INTRAVENOUS | Status: AC
  Administered 2016-10-28: 140 mg via INTRAVENOUS
  Filled 2016-10-28: qty 14

## 2016-10-28 MED ORDER — PALONOSETRON HCL INJECTION 0.25 MG/5ML
INTRAVENOUS | Status: AC
  Filled 2016-10-28: qty 5

## 2016-10-28 MED ORDER — SODIUM CHLORIDE 0.9% FLUSH
10.0000 mL | INTRAVENOUS | Status: DC | PRN
  Administered 2016-10-28: 10 mL via INTRAVENOUS
  Filled 2016-10-28: qty 10

## 2016-10-28 MED ORDER — SODIUM CHLORIDE 0.9% FLUSH
10.0000 mL | INTRAVENOUS | Status: DC | PRN
  Administered 2016-10-28: 10 mL
  Filled 2016-10-28: qty 10

## 2016-10-28 MED ORDER — GEMCITABINE HCL CHEMO INJECTION 1 GM/26.3ML
800.0000 mg/m2 | Freq: Once | INTRAVENOUS | Status: AC
  Administered 2016-10-28: 1520 mg via INTRAVENOUS
  Filled 2016-10-28: qty 39.98

## 2016-10-28 MED ORDER — HEPARIN SOD (PORK) LOCK FLUSH 100 UNIT/ML IV SOLN
500.0000 [IU] | Freq: Once | INTRAVENOUS | Status: AC | PRN
  Administered 2016-10-28: 500 [IU]
  Filled 2016-10-28: qty 5

## 2016-10-28 NOTE — Patient Instructions (Signed)
[facility] Discharge Instructions for Patients Receiving Chemotherapy  Today you received the following chemotherapy agents gemzar/carboplatin  To help prevent nausea and vomiting after your treatment, we encourage you to take your nausea medication as directed   If you develop nausea and vomiting that is not controlled by your nausea medication, call the clinic.   BELOW ARE SYMPTOMS THAT SHOULD BE REPORTED IMMEDIATELY:  *FEVER GREATER THAN 100.5 F  *CHILLS WITH OR WITHOUT FEVER  NAUSEA AND VOMITING THAT IS NOT CONTROLLED WITH YOUR NAUSEA MEDICATION  *UNUSUAL SHORTNESS OF BREATH  *UNUSUAL BRUISING OR BLEEDING  TENDERNESS IN MOUTH AND THROAT WITH OR WITHOUT PRESENCE OF ULCERS  *URINARY PROBLEMS  *BOWEL PROBLEMS  UNUSUAL RASH Items with * indicate a potential emergency and should be followed up as soon as possible.  Feel free to call the clinic you have any questions or concerns. The clinic phone number is [phone].  

## 2016-10-28 NOTE — Patient Instructions (Signed)

## 2016-10-29 ENCOUNTER — Ambulatory Visit (HOSPITAL_BASED_OUTPATIENT_CLINIC_OR_DEPARTMENT_OTHER): Payer: Medicare HMO

## 2016-10-29 VITALS — BP 158/79 | HR 88 | Temp 98.3°F | Resp 18

## 2016-10-29 DIAGNOSIS — C50811 Malignant neoplasm of overlapping sites of right female breast: Secondary | ICD-10-CM

## 2016-10-29 DIAGNOSIS — D701 Agranulocytosis secondary to cancer chemotherapy: Secondary | ICD-10-CM | POA: Diagnosis not present

## 2016-10-29 DIAGNOSIS — Z95828 Presence of other vascular implants and grafts: Secondary | ICD-10-CM

## 2016-10-29 MED ORDER — TBO-FILGRASTIM 480 MCG/0.8ML ~~LOC~~ SOSY
480.0000 ug | PREFILLED_SYRINGE | Freq: Once | SUBCUTANEOUS | Status: AC
  Administered 2016-10-29: 480 ug via SUBCUTANEOUS
  Filled 2016-10-29: qty 0.8

## 2016-10-29 NOTE — Patient Instructions (Signed)
Tbo-Filgrastim injection What is this medicine? TBO-FILGRASTIM (T B O fil GRA stim) is a granulocyte colony-stimulating factor that stimulates the growth of neutrophils, a type of white blood cell important in the body's fight against infection. It is used to reduce the incidence of fever and infection in patients with certain types of cancer who are receiving chemotherapy that affects the bone marrow. This medicine may be used for other purposes; ask your health care provider or pharmacist if you have questions. COMMON BRAND NAME(S): Granix What should I tell my health care provider before I take this medicine? They need to know if you have any of these conditions: -bone scan or tests planned -kidney disease -sickle cell anemia -an unusual or allergic reaction to tbo-filgrastim, filgrastim, pegfilgrastim, other medicines, foods, dyes, or preservatives -pregnant or trying to get pregnant -breast-feeding How should I use this medicine? This medicine is for injection under the skin. If you get this medicine at home, you will be taught how to prepare and give this medicine. Refer to the Instructions for Use that come with your medication packaging. Use exactly as directed. Take your medicine at regular intervals. Do not take your medicine more often than directed. It is important that you put your used needles and syringes in a special sharps container. Do not put them in a trash can. If you do not have a sharps container, call your pharmacist or healthcare provider to get one. Talk to your pediatrician regarding the use of this medicine in children. Special care may be needed. Overdosage: If you think you have taken too much of this medicine contact a poison control center or emergency room at once. NOTE: This medicine is only for you. Do not share this medicine with others. What if I miss a dose? It is important not to miss your dose. Call your doctor or health care professional if you miss a  dose. What may interact with this medicine? This medicine may interact with the following medications: -medicines that may cause a release of neutrophils, such as lithium This list may not describe all possible interactions. Give your health care provider a list of all the medicines, herbs, non-prescription drugs, or dietary supplements you use. Also tell them if you smoke, drink alcohol, or use illegal drugs. Some items may interact with your medicine. What should I watch for while using this medicine? You may need blood work done while you are taking this medicine. What side effects may I notice from receiving this medicine? Side effects that you should report to your doctor or health care professional as soon as possible: -allergic reactions like skin rash, itching or hives, swelling of the face, lips, or tongue -blood in the urine -dark urine -dizziness -fast heartbeat -feeling faint -shortness of breath or breathing problems -signs and symptoms of infection like fever or chills; cough; or sore throat -signs and symptoms of kidney injury like trouble passing urine or change in the amount of urine -stomach or side pain, or pain at the shoulder -sweating -swelling of the legs, ankles, or abdomen -tiredness Side effects that usually do not require medical attention (report to your doctor or health care professional if they continue or are bothersome): -bone pain -headache -muscle pain -vomiting This list may not describe all possible side effects. Call your doctor for medical advice about side effects. You may report side effects to FDA at 1-800-FDA-1088. Where should I keep my medicine? Keep out of the reach of children. Store in a refrigerator between   2 and 8 degrees C (36 and 46 degrees F). Keep in carton to protect from light. Throw away this medicine if it is left out of the refrigerator for more than 5 consecutive days. Throw away any unused medicine after the expiration  date. NOTE: This sheet is a summary. It may not cover all possible information. If you have questions about this medicine, talk to your doctor, pharmacist, or health care provider.  2018 [employer] Standard (2015-07-24 19:07:04)  

## 2016-10-30 ENCOUNTER — Ambulatory Visit: Payer: Medicare HMO

## 2016-10-30 MED ORDER — PEGFILGRASTIM INJECTION 6 MG/0.6ML ~~LOC~~
6.0000 mg | PREFILLED_SYRINGE | Freq: Once | SUBCUTANEOUS | Status: DC
  Filled 2016-10-30: qty 0.6

## 2016-10-30 MED ORDER — TBO-FILGRASTIM 480 MCG/0.8ML ~~LOC~~ SOSY: 480.0000 ug | PREFILLED_SYRINGE | Freq: Once | SUBCUTANEOUS | Status: DC

## 2016-11-04 ENCOUNTER — Ambulatory Visit: Payer: Medicare HMO

## 2016-11-04 ENCOUNTER — Other Ambulatory Visit: Payer: Medicare HMO

## 2016-11-14 NOTE — Progress Notes (Signed)
[name]  [phone] [phone]  Clinic Follow Up Note   Patient Care Team: [name], MD as PCP - General (Internal Medicine) 11/18/2016   CHIEF COMPLAINTS:  Follow up right breast cancer   Oncology History   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 [facility])   Staging form: Breast, AJCC 7th Edition   - Clinical stage from 11/30/2015: Stage IV (T4d, N3c, M1) - Signed by Brandy Merle, MD on 01/05/2016      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 [facility])   11/30/2015 Initial Diagnosis    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 [facility])      11/30/2015 Initial Biopsy    Right axilla lymph node biopsy showed metastatic poorly differentiated adenocarcinoma, IHC positive for CK AE1/AE3, CK7, E-cadherin, CDX-2, and focally very weak to equivocal staining for ER      12/21/2015 Imaging    Bilateral breast MRI showed extensive non-mass enhancement involving the entire right breast, right axillary, supraclavicular, and internal mammary adenopathy, new left axillary adenopathy      12/27/2015 Mammogram    Diagnostic mammogram and ultrasound of right breast and axilla showed diffuse skin thickening and increased density, no focal mass, numerous enlarged right axillary lymph node, the largest measuring 5.3cm.       12/27/2015 Initial Biopsy    Right breast core needle biopsy showed invasive ductal carcinoma, and DCIS, lymphovascular invasion identified      12/27/2015 Receptors her2    ER negative, PR negative, Ki-67 70%, HER-2 negative      01/03/2016 Imaging    PET scan showed diffuse asymmetric right breast hypermetabolic soft tissue density and skin thickening, hypermetabolic metastatic lymphadenopathy in bilateral axillary and subpectoral regions, right supraclavicular region and the right cervical level 5      01/10/2016 - 05/28/2016 Chemotherapy    Weekly Taxol 80 mg/m, second cycle was  postponed to 8/22 due to pt's non-compliance, stopped due to disease progression. She developed mild peripheral neuropathy in early Dec 2017.      02/07/2016 Pathology Results    Left axillary lymph node biopsy showed metastatic poorly differentiated carcinoma, triple negative.      04/24/2016 Imaging    CT CHEST, ABDOMEN AND PELVIS WITH CONTRAST IMPRESSION: 1. Bulky right axillary and mild right retropectoral and right supraclavicular lymphadenopathy is mildly decreased since 01/03/2016 PET-CT. 2. Left axillary lymphadenopathy is mildly increased. 3. Diffuse asymmetric thickening of the skin and fibroglandular soft tissues of the right breast is not appreciably changed. 4. No definite additional sites of metastatic disease in the chest, abdomen or pelvis. 5. Subcentimeter right liver lobe lesion is too small to characterize and is probably stable. 6. Additional findings include aortic atherosclerosis, 1 vessel coronary atherosclerosis, tiny hiatal hernia and myomatous uterus.      06/05/2016 Imaging    PET scan showed progressive right breast mass, extending in size and metabolic activity. Enlarging bilateral axillary adenopathy likewise in increased activity. A right supraclavicular lymph node is slightly smaller than before, no other new metastasis.      06/21/2016 - 08/12/2016 Chemotherapy    Adriamycin 60 mg/m, and Cytoxan 600 mg/m, every 2-3 weeks, for a total of 4 cycles.       08/23/2016 PET scan    1. Response to therapy. Significant decrease in size and hypermetabolism of right breast mass and bilateral axillary nodal metastasis. Hypermetabolism within a right supraclavicular node has resolved.  2. New soft tissue fullness and hypermetabolism within the lateral left breast, suspicious for either metastatic disease or new primary. Mastitis could look similar. 3. No extrathoracic metastatic disease identified. 4.  Coronary artery atherosclerosis. Aortic atherosclerosis. 5.  Diffuse marrow hypermetabolism is likely related to stimulation by chemotherapy. This decreases sensitivity for osseous metastasis.      09/09/2016 -  Chemotherapy    Carboplatin AUC 2, and gemcitabine 1000 mg/m, on day 1 and 8 every 21 days, dose reduced from cycle 2 due to significant neutropenia and thrombocytopenia. Neulasta added on day 9 from cycle 2 and granix added day 2 cycle 2        HISTORY OF PRESENTING ILLNESS (12/14/2015) :  [name] 71 y.o. 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 She is accompanied by her husband to the clinic today.  She noticed a lump at right axilla in 08/2015, no pain or other complains, she also report right breast swelling, no pain, skin erythema, nipple discharge or other complain. She otherwise feels well. She was initially seen at urgent care in March, then was referred to establish her care with prior care physician at [facility] internal medicine center. Her screening mammogram was negative in January 2017. She subsequently underwent CT chest on 09/27/2015 which showed enlarged and inflamed right breast, bulky right axillary lymph nodes. She was referred to breast Center for diagnostic mammogram and ultrasound of right breast, which was negative. She underwent ultrasound guided right axillary node biopsy on 12/05/2015, which reviewed port differentiated carcinoma. ER weakly focally positive.  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 She works independently. She lives with her husband. She denies any fever, night sweats, chills, or skin itchiness.  CURRENT THERAPY: third line chemo carboplatin AUC 2  and gemcitabine 10065mm2 weekly, 2 weeks on and one week off, started on 09/09/16, dose reduction from cycle 2 due to significant neutropenia and thrombocytopenia, Neulasta added on day 9 from cycle 2.   INTERIM HISTORY:  [place] for follow up. She presents to the clinic today with her husband. She reports her right breast is still hard and heavier. She reports no one called her for a biopsy of her breast. She is wondering when how many cycles she has remaining. The chemo did not make her sick last time. She reports her weight fluxuates and she has stopped eating fried food. She has pain in her right breast as well and denies nausea and vomiting. She had slight issues with port today.    MEDICAL HISTORY:  Past Medical History:  Diagnosis Date  . Arthritis   . GERD (gastroesophageal reflux disease)   . Hypertension   . Shortness of breath   . Sleep apnea   [facility]    SURGICAL HISTORY: Past Surgical History:  Procedure Laterality Date  . IR GENERIC HISTORICAL  06/13/2016   IR [place] VASC ACCESS LEFT 06/13/2016 JaCorrie MckusickDO [facility]  . IR GENERIC HISTORICAL  06/13/2016   IR FLUORO GUIDE PORT INSERTION LEFT 06/13/2016 JaCorrie MckusickDO [facility]  . NO PAST SURGERIES  SOCIAL HISTORY: Social History   Social History  . Marital status: Married    Spouse name: N/A  . Number of children: 4  . Years of education: 8 TH   Occupational History  . Not on file.   Social History Main Topics  . Smoking status: Former Smoker    Packs/day: 0.50    Years: 1.00    Types: Cigarettes    Start date: 06/17/1968    Quit date: 06/17/1973  . Smokeless tobacco: Never Used     Comment: Quit in 78s  . Alcohol use No     Comment: former drinker for approximately 10 years  . Drug use: No  . Sexual activity: Not on file   Other Topics Concern  . Not on file   Social History Narrative   Patient is married with 3 children still living and 1 deceased son.   Patient  is right handed.   Patient has 8 th grade education.   Patient drinks 2-3 servings daily.    FAMILY HISTORY: Family History  Problem Relation Age of Onset  . Heart disease Mother   . Cancer Father 62       throat cancer  . Cancer Cousin        breast cancer    ALLERGIES:  has No Known Allergies.  MEDICATIONS:  Current Outpatient Prescriptions  Medication Sig Dispense Refill  . amLODipine (NORVASC) 10 MG tablet Take 1 tablet (10 mg total) by mouth daily. 90 tablet 3  . aspirin EC 81 MG tablet Take 1 tablet (81 mg total) by mouth daily. 90 tablet 3  . atorvastatin (LIPITOR) 20 MG tablet TAKE 1 TABLET EVERY DAY (Patient taking differently: Take 20 mg by mouth daily) 90 tablet 3  . lidocaine-prilocaine (EMLA) cream Apply to port @ 1 hour prior to treatment. 30 g 1  . metoprolol tartrate (LOPRESSOR) 25 MG tablet TAKE 1 TABLET EVERY DAY 90 tablet 3  . ondansetron (ZOFRAN) 8 MG tablet Take by mouth every 8 (eight) hours as needed for nausea or vomiting.    . prochlorperazine (COMPAZINE) 10 MG tablet Take 10 mg by mouth every 6 (six) hours as needed for nausea or vomiting.    . traMADol (ULTRAM) 50 MG tablet Take 1 tablet (50 mg total) by mouth every 8 (eight) hours as needed. 60 tablet 1   No current facility-administered medications for this visit.    Facility-Administered Medications Ordered in Other Visits  Medication Dose Route Frequency Provider Last Rate Last Dose  . pegfilgrastim (NEULASTA) injection 6 mg  6 mg Subcutaneous Once Brandy Merle, MD      . sodium chloride flush (NS) 0.9 % injection 10 mL  10 mL Intravenous PRN Brandy Merle, MD   10 mL at 08/12/16 1754  . sodium chloride flush (NS) 0.9 % injection 10 mL  10 mL Intravenous PRN Brandy Merle, MD   10 mL at 11/18/16 1516       REVIEW OF SYSTEMS:  Constitutional: Denies fevers, chills or abnormal night sweats  (+) weight loss Eyes: Denies blurriness of vision, double vision or watery eyes Ears, nose, mouth, throat, and face:  Denies mucositis or sore throat Respiratory: Denies cough, dyspnea or wheezes Cardiovascular: Denies palpitation, chest discomfort (+) leg swelling  Gastrointestinal:  Denies nausea, heartburn or change in bowel habits (+) constipation Skin: no skin rashes.  Lymphatics: Denies new lymphadenopathy or easy bruising Neurological: Negative Behavioral/Psych: Mood is stable, no new changes (+) numbness and tingling hands and  feet, overall much improved  Breast: (+) pain in left breast (+) right breast is hardened after holding chemo and left breast has hardened All other systems were reviewed with the patient and are negative.  PHYSICAL EXAMINATION: ECOG PERFORMANCE STATUS: 1  Vitals:   11/18/16 1223  Pulse: 89  Resp: 18  Temp: 98.6 F (37 C)   Filed Weights   11/18/16 1223  Weight: 176 lb 9.6 oz (80.1 kg)   GENERAL:alert, no distress and comfortable SKIN: skin color, texture, turgor are normal, no rashes or significant lesions EYES: normal, conjunctiva are pink and non-injected, sclera clear OROPHARYNX:no exudate, no erythema and lips, buccal mucosa, and tongue normal  NECK: supple, thyroid normal size, non-tender, without nodularity LYMPH:  no palpable lymphadenopathy in the cervical, axillary or inguinal LUNGS: clear to auscultation and percussion with normal breathing effort HEART: regular rate & rhythm and no murmurs and no lower extremity edema ABDOMEN:abdomen soft, non-tender and normal bowel sounds Musculoskeletal:no cyanosis of digits and no clubbing  PSYCH: alert & oriented x 3 with fluent speech NEURO: Decreased vibration sensation on bilateral ankle and wrist. SKIN: (+) Dry skin of the bilateral feet, no sign of infection.  EXTREMITIES: Bilateral hand and lower extremity edema. Breasts: her entire right breast is firm., with associated skin erythema and edema which appears stable overall. There is a 4X5 cm lymph node at the anterior right axilla, close to right chest wall  of breast margin, no tenderness, movable. There is a orange size mass in central left breast, firm, and mildly tender. There is a palpable 2 cm lymph node in the left axilla.  LABORATORY DATA:  I have reviewed the data as listed CBC Latest Ref Rng & Units 11/18/2016 10/28/2016 10/21/2016  WBC 3.9 - 10.3 10e3/uL 3.5(L) 2.4(L) 3.4(L)  Hemoglobin 11.6 - 15.9 g/dL 10.1(L) 8.9(L) 9.9(L)  Hematocrit 34.8 - 46.6 % 31.0(L) 27.4(L) 30.4(L)  Platelets 145 - 400 10e3/uL 295 166 194   CMP Latest Ref Rng & Units 11/18/2016 10/28/2016 10/21/2016  Glucose 70 - 140 mg/dl 92 87 88  BUN 7.0 - 26.0 mg/dL 21.8 20.1 25.5  Creatinine 0.6 - 1.1 mg/dL 1.4(H) 1.0 1.0  Sodium 136 - 145 mEq/L 141 142 142  Potassium 3.5 - 5.1 mEq/L 4.3 3.9 3.9  Chloride 101 - 111 mmol/L - - -  CO2 22 - 29 mEq/L 26 26 26   Calcium 8.4 - 10.4 mg/dL 9.5 9.2 9.3  Total Protein 6.4 - 8.3 g/dL 7.9 7.5 7.7  Total Bilirubin 0.20 - 1.20 mg/dL 0.66 0.39 0.40  Alkaline Phos 40 - 150 U/L 79 70 69  AST 5 - 34 U/L 14 15 16   ALT 0 - 55 U/L 12 19 18    CA27.29: 01/05/2016: 155.9 02/27/2016: 96.7 03/26/2016: 64.7 04/23/2016: 65.4 05/21/2016: 99.2 07/15/2016: 215.4  08/12/2016: 176.9 09/09/2016: 112 10/07/16: 109.6   PATHOLOGY REPORT: Diagnosis 11/30/2015 Lymph node, needle/core biopsy, right breast/axilla - LYMPH NODE POSITIVE FOR METASTATIC POORLY DIFFERENTIATED CARCINOMA. - SEE COMMENT. Microscopic Comment Immunohistochemical stains are performed. The tumor is positive for cytokeratin AE1/AE3, cytokeratin 7 and E-cadherin. CDX-2 demonstrates weak positivity. Estrogen receptor demonstrates focal very weak to equivocal staining. Gross cystic disease fluid protein, cytokeratin 20, TTF-1, Melan-A and S100 are all negative. The findings are consistent with a poorly differentiated carcinoma. Given the staining pattern, an upper gastrointestinal and pancreatobiliary primary source should be ruled out. In addition, given the axillary location of the biopsy  and focal weak to equivocal estrogen receptor staining, ruling out a  poorly differentiated carcinoma of breast primary source is also prudent. Lastly, a gynecologic primary may be considered. Correlation with clinical and radiologic impression is essential. Dr. Vicente Males has seen this case in consultation with essential agreement of the above diagnosis and comment. The findings are called to the [place] on 12/04/15. (RAH:gt, 12/04/15)  Diagnosis 12/27/2015 Breast, right, needle core biopsy, central - INVASIVE MAMMARY CARCINOMA. - MAMMARY CARCINOMA IN SITU. - LYMPH/VASCULAR INVASION IS IDENTIFIED. - SEE COMMENT. Microscopic Comment An E-cadherin stain will be performed to determine if the carcinoma is ductal or lobular in nature. The results of the stain will be reported in an addendum to follow. Although definitive grading of breast carcinoma is best done on excision, the features of the invasive tumor from the right central breast biopsy are compatible with a grade 3 breast carcinoma. Breast prognostic markers will be performed and reported in an addendum. Findings are called to the [place] on 12/28/2015. Dr. Lyndon Code has seen this case in consultation with agreement. (RH:kh 12-28-15) ADDENDUM: An E-cadherin immunohistochemical stain is performed which is positive in both the invasive and in situ components confirming the ductal nature of both (i.e. invasive ductal carcinoma with ductal carcinoma in situ). (RAH:gt, 12/29/15) PROGNOSTIC INDICATORS Results: IMMUNOHISTOCHEMICAL AND MORPHOMETRIC ANALYSIS PERFORMED MANUALLY Estrogen Receptor: 0%, NEGATIVE Progesterone Receptor: 0%, NEGATIVE Proliferation Marker Ki67: 70% Results: HER2 - NEGATIVE RATIO OF HER2/CEP17 SIGNALS 1.13 AVERAGE HER2 COPY NUMBER PER CELL 1.75  Diagnosis 02/07/2016 Lymph node, needle/core biopsy, left axillary - METASTATIC POORLY DIFFERENTIATED  CARCINOMA. Results: IMMUNOHISTOCHEMICAL AND MORPHOMETRIC ANALYSIS PERFORMED MANUALLY Estrogen Receptor: 0%, NEGATIVE Progesterone Receptor: 0%, NEGATIVE Results: HER2 - NEGATIVE RATIO OF HER2/CEP17 SIGNALS 1.21 AVERAGE HER2 COPY NUMBER PER CELL 2.05  RADIOGRAPHIC STUDIES: I have personally reviewed the radiological images as listed and agreed with the findings in the report.  PET 08/23/2016 IMPRESSION: 1. Response to therapy. Significant decrease in size and hypermetabolism of right breast mass and bilateral axillary nodal metastasis. Hypermetabolism within a right supraclavicular node has resolved. 2. New soft tissue fullness and hypermetabolism within the lateral left breast, suspicious for either metastatic disease or new primary. Mastitis could look similar. 3. No extrathoracic metastatic disease identified. 4.  Coronary artery atherosclerosis. Aortic atherosclerosis. 5. Diffuse marrow hypermetabolism is likely related to stimulation by chemotherapy. This decreases sensitivity for osseous metastasis.  PET 06/05/2016 IMPRESSION: 1. Progressive right breast mass, expanding in size and metabolic activity. Enlarging but bilateral axillary adenopathy likewise increased activity. A right supraclavicular lymph node is slightly smaller than previous but has a higher standard uptake value. Overall appearance compatible with progression but no further new metastatic spread compared to 01/03/2016. 2. Coronary, aortic arch, and branch vessel atherosclerotic vascular disease. Aortoiliac atherosclerotic vascular disease. 3. Extensive edema along the anterior chest wall especially both breasts, right greater than left.  ASSESSMENT & PLAN:  71 y.o. female, with past medical history of HTN, GERD, sleep apnea, presented with bulky right axillary adenopathy.  1. Primary cancer of right breast with metastasis to distant lymph nodes, invasive ductal carcinoma and DCIS, ER-/PR-/HER2-,   [id number], stage IV  -I previously reviewed her mammogram, ultrasound and a CT chest, and biopsy pathology findings with patient and her husband in details -I previously discussed her breast MRI, PET scan as this is abiopsy results in great details with patient and her family members  -Her breast MRI findings and breast biopsy confirmed primary breast cancer, triple negative. Unfortunately her cancer has metastasized to left axilla and  cervical lymph nodes, which are distant metastasis.  -We previously reviewed the natural history of metastatic triple negative breast cancer, which is very aggressive, and her cancer is incurable at this stage  -Giving the metastatic disease, surgery up front is not indicated, I recommended systemic chemotherapy. --She is now on third line chemotherapy with carboplatin and gemcitabine, tolerated well, however she has developed a severe neutropenia and thrombocytopenia, and prolonged recovery, does reduced from cycle 2 -Labs previously reviewed, adequate for treatment, will start cycle 3 chemo -continue Neulasta injection on day 9   -Plan to repeat PET scan next week -if she has further disease progression, will consider forth line chemo with immunotherapy  -We discussed her treatment is not curative but palliative to stabilize her cancer.   2. ANEMIA in neoplastic disease -She previously developed normocytic mild anemia in the past few months. -Her anemia workup previously showed a normal folic acid and S50 level, increased ferritin, decreased serum iron, TIBC and transferrin saturation. This is most consistent with anemia of chronic disease, secondary to her CKD and underlying malignancy.  -I previously encouraged her to continue oral iron supplement  3. Neutropenia and thrombocytopenia from chemotherapy -She had nadir plt 26K and ANC 0.4K after first cycle chemotherapy -Her blood counts are recovering -We previously reduced her chemotherapy dose the following  cycle  4. CKD stage III -Her GFR was previously in 40-50's  -No lab signs of tumor lysis -Possibly related to her underlying hypertension and diuretics  -Her Cr has previously much improved since she stopped her Lasix. -Cr increased to 1.4 today, will give IVF  -Avoid nephrotoxins, such as NSAIDs and IV CT contrast  5. HTN, GERD, obesity, sleep apnea -She'll continue follow-up with her primary care physician  6. Right breast pain -secondary to breast cancer, previously much improved after chemo  -continue tramadol as needed  -She now feels hardening in right breast but no pain.   7. Goal of care discussion  -We previously discussed the incurable nature of her cancer, and the overall poor prognosis, especially if she does not have good response to chemotherapy or progress on chemo -The patient understands the goal of care is palliative. -I previously recommend DNR/DNI, she will think about it   PLAN -PET scan on 6/13 and see me on 6/14  -labs reviewed and adequate for treatment today, will proceed with C3D1 carboplatin and gemcitabine, day 8 treatment next week   I spent 25 minutes counseling the patient face to face. The total time spent in the appointment was 30 minutes and more than 50% was on counseling.  This document serves as a record of services personally performed by Brandy Merle, MD. It was created on her behalf by Joslyn Devon, a trained medical scribe. The creation of this record is based on the scribe's personal observations and the provider's statements to them. This document has been checked and approved by the attending provider.   I have reviewed the above documentation for accuracy and completeness and I agree with the above.    Brandy Merle, MD 11/18/2016

## 2016-11-18 ENCOUNTER — Ambulatory Visit (HOSPITAL_BASED_OUTPATIENT_CLINIC_OR_DEPARTMENT_OTHER): Payer: Medicare HMO | Admitting: Hematology

## 2016-11-18 ENCOUNTER — Ambulatory Visit (HOSPITAL_BASED_OUTPATIENT_CLINIC_OR_DEPARTMENT_OTHER): Payer: Medicare HMO

## 2016-11-18 ENCOUNTER — Other Ambulatory Visit (HOSPITAL_BASED_OUTPATIENT_CLINIC_OR_DEPARTMENT_OTHER): Payer: Medicare HMO

## 2016-11-18 ENCOUNTER — Encounter: Payer: Self-pay | Admitting: Hematology

## 2016-11-18 VITALS — HR 89 | Temp 98.6°F | Resp 18 | Ht 64.0 in | Wt 176.6 lb

## 2016-11-18 DIAGNOSIS — Z171 Estrogen receptor negative status [ER-]: Secondary | ICD-10-CM

## 2016-11-18 DIAGNOSIS — Z452 Encounter for adjustment and management of vascular access device: Secondary | ICD-10-CM

## 2016-11-18 DIAGNOSIS — C50911 Malignant neoplasm of unspecified site of right female breast: Secondary | ICD-10-CM

## 2016-11-18 DIAGNOSIS — N183 Chronic kidney disease, stage 3 unspecified: Secondary | ICD-10-CM

## 2016-11-18 DIAGNOSIS — C778 Secondary and unspecified malignant neoplasm of lymph nodes of multiple regions: Secondary | ICD-10-CM | POA: Diagnosis not present

## 2016-11-18 DIAGNOSIS — D630 Anemia in neoplastic disease: Secondary | ICD-10-CM

## 2016-11-18 DIAGNOSIS — Z95828 Presence of other vascular implants and grafts: Secondary | ICD-10-CM

## 2016-11-18 DIAGNOSIS — D701 Agranulocytosis secondary to cancer chemotherapy: Secondary | ICD-10-CM | POA: Diagnosis not present

## 2016-11-18 DIAGNOSIS — I10 Essential (primary) hypertension: Secondary | ICD-10-CM

## 2016-11-18 DIAGNOSIS — Z5111 Encounter for antineoplastic chemotherapy: Secondary | ICD-10-CM | POA: Diagnosis not present

## 2016-11-18 DIAGNOSIS — D6959 Other secondary thrombocytopenia: Secondary | ICD-10-CM | POA: Diagnosis not present

## 2016-11-18 DIAGNOSIS — G4733 Obstructive sleep apnea (adult) (pediatric): Secondary | ICD-10-CM

## 2016-11-18 DIAGNOSIS — E669 Obesity, unspecified: Secondary | ICD-10-CM

## 2016-11-18 LAB — COMPREHENSIVE METABOLIC PANEL
ALBUMIN: 3.9 g/dL (ref 3.5–5.0)
ALK PHOS: 79 U/L (ref 40–150)
ALT: 12 U/L (ref 0–55)
AST: 14 U/L (ref 5–34)
Anion Gap: 10 mEq/L (ref 3–11)
BILIRUBIN TOTAL: 0.66 mg/dL (ref 0.20–1.20)
BUN: 21.8 mg/dL (ref 7.0–26.0)
CO2: 26 meq/L (ref 22–29)
Calcium: 9.5 mg/dL (ref 8.4–10.4)
Chloride: 105 mEq/L (ref 98–109)
Creatinine: 1.4 mg/dL — ABNORMAL HIGH (ref 0.6–1.1)
EGFR: 44 mL/min/{1.73_m2} — AB (ref >90)
GLUCOSE: 92 mg/dL (ref 70–140)
Potassium: 4.3 mEq/L (ref 3.5–5.1)
SODIUM: 141 meq/L (ref 136–145)
TOTAL PROTEIN: 7.9 g/dL (ref 6.4–8.3)

## 2016-11-18 LAB — CBC WITH DIFFERENTIAL/PLATELET
BASO%: 1.6 % (ref 0.0–2.0)
Basophils Absolute: 0.1 10*3/uL (ref 0.0–0.1)
EOS%: 1.3 % (ref 0.0–7.0)
Eosinophils Absolute: 0 10*3/uL (ref 0.0–0.5)
HCT: 31 % — ABNORMAL LOW (ref 34.8–46.6)
HEMOGLOBIN: 10.1 g/dL — AB (ref 11.6–15.9)
LYMPH%: 14.1 % (ref 14.0–49.7)
MCH: 29.5 pg (ref 25.1–34.0)
MCHC: 32.4 g/dL (ref 31.5–36.0)
MCV: 90.9 fL (ref 79.5–101.0)
MONO#: 1 10*3/uL — ABNORMAL HIGH (ref 0.1–0.9)
MONO%: 28 % — AB (ref 0.0–14.0)
NEUT%: 55 % (ref 38.4–76.8)
NEUTROS ABS: 1.9 10*3/uL (ref 1.5–6.5)
Platelets: 295 10*3/uL (ref 145–400)
RBC: 3.41 10*6/uL — AB (ref 3.70–5.45)
RDW: 16.3 % — ABNORMAL HIGH (ref 11.2–14.5)
WBC: 3.5 10*3/uL — AB (ref 3.9–10.3)
lymph#: 0.5 10*3/uL — ABNORMAL LOW (ref 0.9–3.3)

## 2016-11-18 MED ORDER — PALONOSETRON HCL INJECTION 0.25 MG/5ML
0.2500 mg | Freq: Once | INTRAVENOUS | Status: AC
  Administered 2016-11-18: 0.25 mg via INTRAVENOUS

## 2016-11-18 MED ORDER — SODIUM CHLORIDE 0.9% FLUSH
10.0000 mL | INTRAVENOUS | Status: DC | PRN
  Administered 2016-11-18 (×2): 10 mL via INTRAVENOUS
  Filled 2016-11-18: qty 10

## 2016-11-18 MED ORDER — DEXAMETHASONE SODIUM PHOSPHATE 10 MG/ML IJ SOLN
10.0000 mg | Freq: Once | INTRAMUSCULAR | Status: AC
  Administered 2016-11-18: 10 mg via INTRAVENOUS

## 2016-11-18 MED ORDER — GEMCITABINE HCL CHEMO INJECTION 1 GM/26.3ML
800.0000 mg/m2 | Freq: Once | INTRAVENOUS | Status: AC
  Administered 2016-11-18: 1520 mg via INTRAVENOUS
  Filled 2016-11-18: qty 39.98

## 2016-11-18 MED ORDER — HEPARIN SOD (PORK) LOCK FLUSH 100 UNIT/ML IV SOLN
500.0000 [IU] | Freq: Once | INTRAVENOUS | Status: AC | PRN
  Administered 2016-11-18: 500 [IU]
  Filled 2016-11-18: qty 5

## 2016-11-18 MED ORDER — SODIUM CHLORIDE 0.9% FLUSH
10.0000 mL | INTRAVENOUS | Status: DC | PRN
  Filled 2016-11-18: qty 10

## 2016-11-18 MED ORDER — ALTEPLASE 2 MG IJ SOLR
2.0000 mg | Freq: Once | INTRAMUSCULAR | Status: AC | PRN
  Administered 2016-11-18: 2 mg
  Filled 2016-11-18: qty 2

## 2016-11-18 MED ORDER — SODIUM CHLORIDE 0.9 % IV SOLN
109.2000 mg | Freq: Once | INTRAVENOUS | Status: AC
  Administered 2016-11-18: 110 mg via INTRAVENOUS
  Filled 2016-11-18: qty 11

## 2016-11-18 MED ORDER — PALONOSETRON HCL INJECTION 0.25 MG/5ML
INTRAVENOUS | Status: AC
  Filled 2016-11-18: qty 5

## 2016-11-18 MED ORDER — SODIUM CHLORIDE 0.9 % IV SOLN
Freq: Once | INTRAVENOUS | Status: AC
  Administered 2016-11-18: 14:00:00 via INTRAVENOUS

## 2016-11-18 MED ORDER — DEXAMETHASONE SODIUM PHOSPHATE 10 MG/ML IJ SOLN
INTRAMUSCULAR | Status: AC
  Filled 2016-11-18: qty 1

## 2016-11-18 NOTE — Patient Instructions (Signed)
[facility] Discharge Instructions for Patients Receiving Chemotherapy  Today you received the following chemotherapy agents gemzar/carboplatin  To help prevent nausea and vomiting after your treatment, we encourage you to take your nausea medication as directed   If you develop nausea and vomiting that is not controlled by your nausea medication, call the clinic.   BELOW ARE SYMPTOMS THAT SHOULD BE REPORTED IMMEDIATELY:  *FEVER GREATER THAN 100.5 F  *CHILLS WITH OR WITHOUT FEVER  NAUSEA AND VOMITING THAT IS NOT CONTROLLED WITH YOUR NAUSEA MEDICATION  *UNUSUAL SHORTNESS OF BREATH  *UNUSUAL BRUISING OR BLEEDING  TENDERNESS IN MOUTH AND THROAT WITH OR WITHOUT PRESENCE OF ULCERS  *URINARY PROBLEMS  *BOWEL PROBLEMS  UNUSUAL RASH Items with * indicate a potential emergency and should be followed up as soon as possible.  Feel free to call the clinic you have any questions or concerns. The clinic phone number is [phone].  

## 2016-11-18 NOTE — Progress Notes (Signed)
Aspirated TPA from port & blood return obtained @ 1220 with pt taking deep breath.  Flushed with 10 cc NS.

## 2016-11-18 NOTE — Patient Instructions (Signed)
Implanted Port Home Guide An implanted port is a type of central line that is placed under the skin. Central lines are used to provide IV access when treatment or nutrition needs to be given through a person's veins. Implanted ports are used for long-term IV access. An implanted port may be placed because:  You need IV medicine that would be irritating to the small veins in your hands or arms.  You need long-term IV medicines, such as antibiotics.  You need IV nutrition for a long period.  You need frequent blood draws for lab tests.  You need dialysis.  Implanted ports are usually placed in the chest area, but they can also be placed in the upper arm, the abdomen, or the leg. An implanted port has two main parts:  Reservoir. The reservoir is round and will appear as a small, raised area under your skin. The reservoir is the part where a needle is inserted to give medicines or draw blood.  Catheter. The catheter is a thin, flexible tube that extends from the reservoir. The catheter is placed into a large vein. Medicine that is inserted into the reservoir goes into the catheter and then into the vein.  How will I care for my incision site? Do not get the incision site wet. Bathe or shower as directed by your health care provider. How is my port accessed? Special steps must be taken to access the port:  Before the port is accessed, a numbing cream can be placed on the skin. This helps numb the skin over the port site.  Your health care provider uses a sterile technique to access the port. ? Your health care provider must put on a mask and sterile gloves. ? The skin over your port is cleaned carefully with an antiseptic and allowed to dry. ? The port is gently pinched between sterile gloves, and a needle is inserted into the port.  Only "non-coring" port needles should be used to access the port. Once the port is accessed, a blood return should be checked. This helps ensure that the port  is in the vein and is not clogged.  If your port needs to remain accessed for a constant infusion, a clear (transparent) bandage will be placed over the needle site. The bandage and needle will need to be changed every week, or as directed by your health care provider.  Keep the bandage covering the needle clean and dry. Do not get it wet. Follow your health care provider's instructions on how to take a shower or bath while the port is accessed.  If your port does not need to stay accessed, no bandage is needed over the port.  What is flushing? Flushing helps keep the port from getting clogged. Follow your health care provider's instructions on how and when to flush the port. Ports are usually flushed with saline solution or a medicine called heparin. The need for flushing will depend on how the port is used.  If the port is used for intermittent medicines or blood draws, the port will need to be flushed: ? After medicines have been given. ? After blood has been drawn. ? As part of routine maintenance.  If a constant infusion is running, the port may not need to be flushed.  How long will my port stay implanted? The port can stay in for as long as your health care provider thinks it is needed. When it is time for the port to come out, surgery will be   done to remove it. The procedure is similar to the one performed when the port was put in. When should I seek immediate medical care? When you have an implanted port, you should seek immediate medical care if:  You notice a bad smell coming from the incision site.  You have swelling, redness, or drainage at the incision site.  You have more swelling or pain at the port site or the surrounding area.  You have a fever that is not controlled with medicine.  This information is not intended to replace advice given to you by your health care provider. Make sure you discuss any questions you have with your health care provider. Document  Released: 06/03/2005 Document Revised: 11/09/2015 Document Reviewed: 02/08/2013 [employer] Interactive Patient Education  2017 [employer]  

## 2016-11-18 NOTE — Progress Notes (Unsigned)
[name] Port-A-Cath accessed, appox 5 ml of blood return noted, then no blood return noted after the 1st 5 mls. Site flushes well, no pain or swelling noted at site.  Cath-Flo instilled per policy, tubing marked. Informed Dr. Lewayne Bunting desk nurse of findings. Plan to monitor site for blood return.

## 2016-11-19 LAB — CANCER ANTIGEN 27.29: CAN 27.29: 251.6 U/mL — AB (ref 0.0–38.6)

## 2016-11-20 ENCOUNTER — Ambulatory Visit (HOSPITAL_BASED_OUTPATIENT_CLINIC_OR_DEPARTMENT_OTHER): Payer: Medicare HMO

## 2016-11-20 VITALS — BP 139/76 | HR 83 | Temp 98.5°F | Resp 20

## 2016-11-20 DIAGNOSIS — D701 Agranulocytosis secondary to cancer chemotherapy: Secondary | ICD-10-CM

## 2016-11-20 DIAGNOSIS — C50811 Malignant neoplasm of overlapping sites of right female breast: Secondary | ICD-10-CM | POA: Diagnosis not present

## 2016-11-20 DIAGNOSIS — C50911 Malignant neoplasm of unspecified site of right female breast: Secondary | ICD-10-CM

## 2016-11-20 MED ORDER — TBO-FILGRASTIM 480 MCG/0.8ML ~~LOC~~ SOSY
480.0000 ug | PREFILLED_SYRINGE | Freq: Once | SUBCUTANEOUS | Status: AC
  Administered 2016-11-20: 480 ug via SUBCUTANEOUS
  Filled 2016-11-20: qty 0.8

## 2016-11-20 NOTE — Patient Instructions (Signed)
Tbo-Filgrastim injection What is this medicine? TBO-FILGRASTIM (T B O fil GRA stim) is a granulocyte colony-stimulating factor that stimulates the growth of neutrophils, a type of white blood cell important in the body's fight against infection. It is used to reduce the incidence of fever and infection in patients with certain types of cancer who are receiving chemotherapy that affects the bone marrow. This medicine may be used for other purposes; ask your health care provider or pharmacist if you have questions. COMMON BRAND NAME(S): Granix What should I tell my health care provider before I take this medicine? They need to know if you have any of these conditions: -bone scan or tests planned -kidney disease -sickle cell anemia -an unusual or allergic reaction to tbo-filgrastim, filgrastim, pegfilgrastim, other medicines, foods, dyes, or preservatives -pregnant or trying to get pregnant -breast-feeding How should I use this medicine? This medicine is for injection under the skin. If you get this medicine at home, you will be taught how to prepare and give this medicine. Refer to the Instructions for Use that come with your medication packaging. Use exactly as directed. Take your medicine at regular intervals. Do not take your medicine more often than directed. It is important that you put your used needles and syringes in a special sharps container. Do not put them in a trash can. If you do not have a sharps container, call your pharmacist or healthcare provider to get one. Talk to your pediatrician regarding the use of this medicine in children. Special care may be needed. Overdosage: If you think you have taken too much of this medicine contact a poison control center or emergency room at once. NOTE: This medicine is only for you. Do not share this medicine with others. What if I miss a dose? It is important not to miss your dose. Call your doctor or health care professional if you miss a  dose. What may interact with this medicine? This medicine may interact with the following medications: -medicines that may cause a release of neutrophils, such as lithium This list may not describe all possible interactions. Give your health care provider a list of all the medicines, herbs, non-prescription drugs, or dietary supplements you use. Also tell them if you smoke, drink alcohol, or use illegal drugs. Some items may interact with your medicine. What should I watch for while using this medicine? You may need blood work done while you are taking this medicine. What side effects may I notice from receiving this medicine? Side effects that you should report to your doctor or health care professional as soon as possible: -allergic reactions like skin rash, itching or hives, swelling of the face, lips, or tongue -blood in the urine -dark urine -dizziness -fast heartbeat -feeling faint -shortness of breath or breathing problems -signs and symptoms of infection like fever or chills; cough; or sore throat -signs and symptoms of kidney injury like trouble passing urine or change in the amount of urine -stomach or side pain, or pain at the shoulder -sweating -swelling of the legs, ankles, or abdomen -tiredness Side effects that usually do not require medical attention (report to your doctor or health care professional if they continue or are bothersome): -bone pain -headache -muscle pain -vomiting This list may not describe all possible side effects. Call your doctor for medical advice about side effects. You may report side effects to FDA at 1-800-FDA-1088. Where should I keep my medicine? Keep out of the reach of children. Store in a refrigerator between   2 and 8 degrees C (36 and 46 degrees F). Keep in carton to protect from light. Throw away this medicine if it is left out of the refrigerator for more than 5 consecutive days. Throw away any unused medicine after the expiration  date. NOTE: This sheet is a summary. It may not cover all possible information. If you have questions about this medicine, talk to your doctor, pharmacist, or health care provider.  2018 [employer] Standard (2015-07-24 19:07:04)  

## 2016-11-20 NOTE — Progress Notes (Signed)
[place]  [phone] [phone]) [phone]  Clinic Follow Up Note   Patient Care Team: [name], MD as PCP - General (Internal Medicine) 11/28/2016   CHIEF COMPLAINTS:  Follow up right breast cancer   Oncology History   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 [facility])   Staging form: Breast, AJCC 7th Edition   - Clinical stage from 11/30/2015: Stage IV (T4d, N3c, M1) - Signed by Truitt Merle, MD on 01/05/2016      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 [facility])   11/30/2015 Initial Diagnosis    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 [facility])      11/30/2015 Initial Biopsy    Right axilla lymph node biopsy showed metastatic poorly differentiated adenocarcinoma, IHC positive for CK AE1/AE3, CK7, E-cadherin, CDX-2, and focally very weak to equivocal staining for ER      12/21/2015 Imaging    Bilateral breast MRI showed extensive non-mass enhancement involving the entire right breast, right axillary, supraclavicular, and internal mammary adenopathy, new left axillary adenopathy      12/27/2015 Mammogram    Diagnostic mammogram and ultrasound of right breast and axilla showed diffuse skin thickening and increased density, no focal mass, numerous enlarged right axillary lymph node, the largest measuring 5.3cm.       12/27/2015 Initial Biopsy    Right breast core needle biopsy showed invasive ductal carcinoma, and DCIS, lymphovascular invasion identified      12/27/2015 Receptors her2    ER negative, PR negative, Ki-67 70%, HER-2 negative      01/03/2016 Imaging    PET scan showed diffuse asymmetric right breast hypermetabolic soft tissue density and skin thickening, hypermetabolic metastatic lymphadenopathy in bilateral axillary and subpectoral regions, right supraclavicular region and the right cervical level 5      01/10/2016 - 05/28/2016 Chemotherapy    Weekly Taxol 80 mg/m, second cycle was  postponed to 8/22 due to pt's non-compliance, stopped due to disease progression. She developed mild peripheral neuropathy in early Dec 2017.      02/07/2016 Pathology Results    Left axillary lymph node biopsy showed metastatic poorly differentiated carcinoma, triple negative.      04/24/2016 Imaging    CT CHEST, ABDOMEN AND PELVIS WITH CONTRAST IMPRESSION: 1. Bulky right axillary and mild right retropectoral and right supraclavicular lymphadenopathy is mildly decreased since 01/03/2016 PET-CT. 2. Left axillary lymphadenopathy is mildly increased. 3. Diffuse asymmetric thickening of the skin and fibroglandular soft tissues of the right breast is not appreciably changed. 4. No definite additional sites of metastatic disease in the chest, abdomen or pelvis. 5. Subcentimeter right liver lobe lesion is too small to characterize and is probably stable. 6. Additional findings include aortic atherosclerosis, 1 vessel coronary atherosclerosis, tiny hiatal hernia and myomatous uterus.      06/05/2016 Imaging    PET scan showed progressive right breast mass, extending in size and metabolic activity. Enlarging bilateral axillary adenopathy likewise in increased activity. A right supraclavicular lymph node is slightly smaller than before, no other new metastasis.      06/21/2016 - 08/12/2016 Chemotherapy    Adriamycin 60 mg/m, and Cytoxan 600 mg/m, every 2-3 weeks, for a total of 4 cycles.       08/23/2016 PET scan    1. Response to therapy. Significant decrease in size and hypermetabolism of right breast mass and bilateral axillary nodal metastasis. Hypermetabolism within a right supraclavicular node has resolved.  2. New soft tissue fullness and hypermetabolism within the lateral left breast, suspicious for either metastatic disease or new primary. Mastitis could look similar. 3. No extrathoracic metastatic disease identified. 4.  Coronary artery atherosclerosis. Aortic atherosclerosis. 5.  Diffuse marrow hypermetabolism is likely related to stimulation by chemotherapy. This decreases sensitivity for osseous metastasis.      09/09/2016 -  Chemotherapy    Carboplatin AUC 2, and gemcitabine 1000 mg/m, on day 1 and 8 every 21 days, dose reduced from cycle 2 due to significant neutropenia and thrombocytopenia. Neulasta added on day 9 from cycle 2 and granix added day 2 cycle 2  Due to cancer progression change treatment to Eribulin infusion 2 weeks on and 1 week starting 6/21       11/27/2016 PET scan    PET 11/27/16 IMPRESSION: 1. Interval progression of disease. 2. There is increased hypermetabolic activity within bilateral axillary and right pectoral lymph nodes which are similar in overall size to the prior study. 3. There is also extensive hypermetabolic activity throughout both breasts which is nodular and progressive, concerning for local extension or inflammatory breast cancer. 4. New multifocal hypermetabolic osseous metastatic disease. 5. No evidence of metastatic disease to the lungs or within the abdomen or pelvis.        HISTORY OF PRESENTING ILLNESS (12/14/2015) :  [name] 71 y.o. 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 She is accompanied by her husband to the clinic today.  She noticed a lump at right axilla in 08/2015, no pain or other complains, she also report right breast swelling, no pain, skin erythema, nipple discharge or other complain. She otherwise feels well. She was initially seen at urgent care in March, then was referred to establish her care with prior care physician at [facility] internal medicine center. Her screening mammogram was negative in January 2017. She subsequently underwent CT chest on 09/27/2015 which showed enlarged and inflamed right breast, bulky right axillary lymph nodes. She was referred to breast Center for diagnostic mammogram and ultrasound of right breast, which was negative.  She underwent ultrasound guided right axillary node biopsy on 12/05/2015, which reviewed port differentiated carcinoma. ER weakly focally positive.  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 She works independently. She lives with her husband. She denies any fever, night sweats, chills, or skin itchiness.  CURRENT THERAPY: Due to cancer progression change treatment to Eribulin infusion 2 weeks on and 1 week off, and Keytruda every 3 weeks, starting 6/21  INTERIM HISTORY:  LAirisreturns for follow up. She presents to the clinic today with her family. She reports she is going to the hospital to do a dye study to see if her port is open. She reports her breast pain in her right breast is still heavy and hard and in the left breast has slightly softened. She would like to continue tramadol.  She reports her throat has been sore lately with no fever. She does not cough. She thinking the soreness is from laying under air duct. She denies no chest discomfort or stomach problems.    MEDICAL HISTORY:  Past Medical History:  Diagnosis Date  . Arthritis   . GERD (gastroesophageal reflux disease)   . Hypertension   . Shortness of breath   . Sleep apnea   . Stroke [facility])     SURGICAL HISTORY: Past Surgical History:  Procedure Laterality Date  . IR GENERIC HISTORICAL  06/13/2016   IR US GUIDE VASC ACCESS LEFT 06/13/2016 Corrie Mckusick, DO [facility]INTER[facility]  . IR GENERIC HISTORICAL  06/13/2016   IR FLUORO GUIDE PORT INSERTION LEFT 06/13/2016 Corrie Mckusick, DO [facility]  . NO PAST SURGERIES      SOCIAL HISTORY: Social History   Social History  . Marital status: Married    Spouse name: N/A    . Number of children: 4  . Years of education: 8 TH   Occupational History  . Not on file.   Social History Main Topics  . Smoking status: Former Smoker    Packs/day: 0.50    Years: 1.00    Types: Cigarettes    Start date: 06/17/1968    Quit date: 06/17/1973  . Smokeless tobacco: Never Used     Comment: Quit in 24s  . Alcohol use No     Comment: former drinker for approximately 10 years  . Drug use: No  . Sexual activity: Not on file   Other Topics Concern  . Not on file   Social History Narrative   Patient is married with 3 children still living and 1 deceased son.   Patient is right handed.   Patient has 8 th grade education.   Patient drinks 2-3 servings daily.    FAMILY HISTORY: Family History  Problem Relation Age of Onset  . Heart disease Mother   . Cancer Father 45       throat cancer  . Cancer Cousin        breast cancer    ALLERGIES:  has No Known Allergies.  MEDICATIONS:  Current Outpatient Prescriptions  Medication Sig Dispense Refill  . amLODipine (NORVASC) 10 MG tablet Take 1 tablet (10 mg total) by mouth daily. 90 tablet 3  . aspirin EC 81 MG tablet Take 1 tablet (81 mg total) by mouth daily. 90 tablet 3  . atorvastatin (LIPITOR) 20 MG tablet TAKE 1 TABLET EVERY DAY (Patient taking differently: Take 20 mg by mouth daily) 90 tablet 3  . lidocaine-prilocaine (EMLA) cream Apply to port @ 1 hour prior to treatment. 30 g 1  . metoprolol tartrate (LOPRESSOR) 25 MG tablet TAKE 1 TABLET EVERY DAY 90 tablet 3  . ondansetron (ZOFRAN) 8 MG tablet Take by mouth every 8 (eight) hours as needed for nausea or vomiting.    . prochlorperazine (COMPAZINE) 10 MG tablet Take 10 mg by mouth every 6 (six) hours as needed for nausea or vomiting.    . traMADol (ULTRAM) 50 MG tablet Take 1 tablet (50 mg total) by mouth every 8 (eight) hours as needed. 60 tablet 1   No current facility-administered medications for this visit.    Facility-Administered Medications Ordered in  Other Visits  Medication Dose Route Frequency Provider Last Rate Last Dose  . sodium chloride flush (NS) 0.9 % injection 10 mL  10 mL Intravenous PRN Truitt Merle, MD   10 mL at 08/12/16 1754  . sodium chloride flush (NS) 0.9 % injection 10 mL  10 mL Intravenous PRN Truitt Merle, MD   10 mL at 11/18/16 1516       REVIEW OF SYSTEMS:  Constitutional: Denies fevers, chills  or abnormal night sweats  (+) weight loss Eyes: Denies blurriness of vision, double vision or watery eyes Ears, nose, mouth, throat, and face: Denies mucositis (+) sore throat Respiratory: Denies cough, dyspnea or wheezes Cardiovascular: Denies palpitation, chest discomfort (+) leg swelling  Gastrointestinal:  Denies nausea, heartburn or change in bowel habits (+) constipation Skin: no skin rashes.  Lymphatics: Denies new lymphadenopathy or easy bruising Neurological: Negative Behavioral/Psych: Mood is stable, no new changes (+) numbness and tingling hands and feet, overall much improved  Breast: (+) pain in left breast (+) right breast is hardened after holding chemo and left breast has slightly softerned All other systems were reviewed with the patient and are negative.  PHYSICAL EXAMINATION: ECOG PERFORMANCE STATUS: 1  Vitals:   11/28/16 1528  BP: (!) 160/76  Pulse: 82  Resp: 18  Temp: 97.5 F (36.4 C)   Filed Weights   11/28/16 1528  Weight: 177 lb 11.2 oz (80.6 kg)     GENERAL:alert, no distress and comfortable SKIN: skin color, texture, turgor are normal, no rashes or significant lesions EYES: normal, conjunctiva are pink and non-injected, sclera clear OROPHARYNX:no exudate, no erythema and lips, buccal mucosa, and tongue normal  NECK: supple, thyroid normal size, non-tender, without nodularity LYMPH:  no palpable lymphadenopathy in the cervical, axillary or inguinal LUNGS: clear to auscultation and percussion with normal breathing effort HEART: regular rate & rhythm and no murmurs and no lower extremity  edema ABDOMEN:abdomen soft, non-tender and normal bowel sounds Musculoskeletal:no cyanosis of digits and no clubbing  PSYCH: alert & oriented x 3 with fluent speech NEURO: Decreased vibration sensation on bilateral ankle and wrist. SKIN: (+) Dry skin of the bilateral feet, no sign of infection.  EXTREMITIES: Bilateral hand and lower extremity edema. Breasts: her entire right breast is firm., with associated skin erythema and edema which appears stable overall. There is a 4X5 cm lymph node at the anterior right axilla, close to right chest wall of breast margin, no tenderness, movable. There is a orange size mass in central left breast, firm, and mildly tender. There is a palpable 2 cm lymph node in the left axilla.  LABORATORY DATA:  I have reviewed the data as listed CBC Latest Ref Rng & Units 11/25/2016 11/18/2016 10/28/2016  WBC 3.9 - 10.3 10e3/uL 3.7(L) 3.5(L) 2.4(L)  Hemoglobin 11.6 - 15.9 g/dL 8.7(L) 10.1(L) 8.9(L)  Hematocrit 34.8 - 46.6 % 28.1(L) 31.0(L) 27.4(L)  Platelets 145 - 400 10e3/uL 128(L) 295 166   CMP Latest Ref Rng & Units 11/25/2016 11/18/2016 10/28/2016  Glucose 70 - 140 mg/dl 84 92 87  BUN 7.0 - 26.0 mg/dL 28.4(H) 21.8 20.1  Creatinine 0.6 - 1.1 mg/dL 1.4(H) 1.4(H) 1.0  Sodium 136 - 145 mEq/L 143 141 142  Potassium 3.5 - 5.1 mEq/L 4.4 4.3 3.9  Chloride 101 - 111 mmol/L - - -  CO2 22 - 29 mEq/L _0 Calcium 8.4 - 10.4 mg/dL 9.4 9.5 9.2  Total Protein 6.4 - 8.3 g/dL 7.5 7.9 7.5  Total Bilirubin 0.20 - 1.20 mg/dL 0.33 0.66 0.39  Alkaline Phos 40 - 150 U/L 75 79 70  AST 5 - 34 U/L _1 ALT 0 - 55 U/L _2 CA27.29:  01/05/2016: 155.9 02/27/2016: 96.7 03/26/2016: 64.7 04/23/2016: 65.4 05/21/2016: 99.2 07/15/2016: 215.4  08/12/2016: 176.9 09/09/2016: 112 10/07/16: 109.6 11/18/16: 251.6   PATHOLOGY REPORT: Diagnosis 11/30/2015 Lymph node, needle/core biopsy, right breast/axilla - LYMPH NODE POSITIVE FOR METASTATIC POORLY DIFFERENTIATED  CARCINOMA. - SEE  COMMENT. Microscopic Comment Immunohistochemical stains are performed. The tumor is positive for cytokeratin AE1/AE3, cytokeratin 7 and E-cadherin. CDX-2 demonstrates weak positivity. Estrogen receptor demonstrates focal very weak to equivocal staining. Gross cystic disease fluid protein, cytokeratin 20, TTF-1, Melan-A and S100 are all negative. The findings are consistent with a poorly differentiated carcinoma. Given the staining pattern, an upper gastrointestinal and pancreatobiliary primary source should be ruled out. In addition, given the axillary location of the biopsy and focal weak to equivocal estrogen receptor staining, ruling out a poorly differentiated carcinoma of breast primary source is also prudent. Lastly, a gynecologic primary may be considered. Correlation with clinical and radiologic impression is essential. Dr. Vicente Males has seen this case in consultation with essential agreement of the above diagnosis and comment. The findings are called to the [facility] on 12/04/15. (RAH:gt, 12/04/15)  Diagnosis 12/27/2015 Breast, right, needle core biopsy, central - INVASIVE MAMMARY CARCINOMA. - MAMMARY CARCINOMA IN SITU. - LYMPH/VASCULAR INVASION IS IDENTIFIED. - SEE COMMENT. Microscopic Comment An E-cadherin stain will be performed to determine if the carcinoma is ductal or lobular in nature. The results of the stain will be reported in an addendum to follow. Although definitive grading of breast carcinoma is best done on excision, the features of the invasive tumor from the right central breast biopsy are compatible with a grade 3 breast carcinoma. Breast prognostic markers will be performed and reported in an addendum. Findings are called to the [facility] on 12/28/2015. Dr. Lyndon Code has seen this case in consultation with agreement. (RH:kh 12-28-15) ADDENDUM: An E-cadherin immunohistochemical stain is performed which is positive in both the invasive  and in situ components confirming the ductal nature of both (i.e. invasive ductal carcinoma with ductal carcinoma in situ). (RAH:gt, 12/29/15) PROGNOSTIC INDICATORS Results: IMMUNOHISTOCHEMICAL AND MORPHOMETRIC ANALYSIS PERFORMED MANUALLY Estrogen Receptor: 0%, NEGATIVE Progesterone Receptor: 0%, NEGATIVE Proliferation Marker Ki67: 70% Results: HER2 - NEGATIVE RATIO OF HER2/CEP17 SIGNALS 1.13 AVERAGE HER2 COPY NUMBER PER CELL 1.75  Diagnosis 02/07/2016 Lymph node, needle/core biopsy, left axillary - METASTATIC POORLY DIFFERENTIATED CARCINOMA. Results: IMMUNOHISTOCHEMICAL AND MORPHOMETRIC ANALYSIS PERFORMED MANUALLY Estrogen Receptor: 0%, NEGATIVE Progesterone Receptor: 0%, NEGATIVE Results: HER2 - NEGATIVE RATIO OF HER2/CEP17 SIGNALS 1.21 AVERAGE HER2 COPY NUMBER PER CELL 2.05  RADIOGRAPHIC STUDIES: I have personally reviewed the radiological images as listed and agreed with the findings in the report.  PET 11/27/16 IMPRESSION: 1. Interval progression of disease. 2. There is increased hypermetabolic activity within bilateral axillary and right pectoral lymph nodes which are similar in overall size to the prior study. 3. There is also extensive hypermetabolic activity throughout both breasts which is nodular and progressive, concerning for local extension or inflammatory breast cancer. 4. New multifocal hypermetabolic osseous metastatic disease. 5. No evidence of metastatic disease to the lungs or within the abdomen or pelvis.  PET 08/23/2016 IMPRESSION: 1. Response to therapy. Significant decrease in size and hypermetabolism of right breast mass and bilateral axillary nodal metastasis. Hypermetabolism within a right supraclavicular node has resolved. 2. New soft tissue fullness and hypermetabolism within the lateral left breast, suspicious for either metastatic disease or new primary. Mastitis could look similar. 3. No extrathoracic metastatic disease identified. 4.   Coronary artery atherosclerosis. Aortic atherosclerosis. 5. Diffuse marrow hypermetabolism is likely related to stimulation by chemotherapy. This decreases sensitivity for osseous metastasis.  PET 06/05/2016 IMPRESSION: 1. Progressive right breast mass, expanding in size and metabolic activity. Enlarging but bilateral axillary adenopathy likewise increased activity. A right  supraclavicular lymph node is slightly smaller than previous but has a higher standard uptake value. Overall appearance compatible with progression but no further new metastatic spread compared to 01/03/2016. 2. Coronary, aortic arch, and branch vessel atherosclerotic vascular disease. Aortoiliac atherosclerotic vascular disease. 3. Extensive edema along the anterior chest wall especially both breasts, right greater than left.  ASSESSMENT & PLAN:  71 y.o. female, with past medical history of HTN, GERD, sleep apnea, presented with bulky right axillary adenopathy.  1. Primary cancer of right breast with metastasis to distant lymph nodes, invasive ductal carcinoma and DCIS, ER-/PR-/HER2-,  [id number], stage IV  -I previously reviewed her mammogram, ultrasound and a CT chest, and biopsy pathology findings with patient and her husband in details -I previously discussed her breast MRI, PET scan as this is abiopsy results in great details with patient and her family members  -Her breast MRI findings and breast biopsy confirmed primary breast cancer, triple negative. Unfortunately her cancer has metastasized to left axilla and cervical lymph nodes, which are distant metastasis.  -We previously reviewed the natural history of metastatic triple negative breast cancer, which is very aggressive, and her cancer is incurable at this stage  -Giving the metastatic disease, surgery up front is not indicated, I recommended systemic chemotherapy. --She is now on third line chemotherapy with carboplatin and gemcitabine, tolerated well,  however she has developed a severe neutropenia and thrombocytopenia, and prolonged recovery, does reduced from cycle 2 -We discussed PET scan form 11/27/16 shows her cancer is progressing and has metastasized to both breast, lymph nodes and in parts of her bone.  -Due to his cancer progression, I'll stop her carboplatin and gemcitabine -We discussed further treatment options. I recommend to change chemo to Eribulin for 2 weeks on and 1 week off along with and immunotherapy drug Nat Math, based on the [place]. It os off label use.   -I will also test her BRCA1/2 to see if she is a candidate for PARPA inhibitor  -Will start new Eribulin chemo next week 12/05/16, and start [place] as soon as we find drug placement  -She will have a dye study today to test her port.    2. ANEMIA in neoplastic disease -She previously developed normocytic mild anemia in the past few months. -Her anemia workup previously showed a normal folic acid and S34 level, increased ferritin, decreased serum iron, TIBC and transferrin saturation. This is most consistent with anemia of chronic disease, secondary to her CKD and underlying malignancy.  -I previously encouraged her to continue oral iron supplement  3. Neutropenia and thrombocytopenia from chemotherapy -She had nadir plt 26K and ANC 0.4K after first cycle chemotherapy -Her blood counts are recovering -We previously reduced her chemotherapy dose the following cycle  4. CKD stage III -Her GFR was previously in 40-50's  -No lab signs of tumor lysis -Possibly related to her underlying hypertension and diuretics  -Her Cr has previously much improved since she stopped her Lasix. -Cr increased to 1.4 previously, will give IVF  -Avoid nephrotoxins, such as NSAIDs and IV CT contrast  5. HTN, GERD, obesity, sleep apnea -She'll continue follow-up with her primary care physician  6. Right breast pain -secondary to breast cancer, previously much improved after  chemo  -continue tramadol as needed  -She now feels hardening in right breast but no pain.   7. Goal of care discussion  -We again discussed the incurable nature of her cancer, and the overall poor prognosis, especially if she does not have  good response to chemotherapy or progress on chemo -The patient understands the goal of care is palliative. -I again recommend DNR/DNI, she will think about it    PLAN -refill Tramadol today  -Dye study today for port -Lab, flush, and chemo eribulin in 1, 2, 4 and 5 weeks -f/u with app in 2 week and me in 4 weeks -start Keytruda in 2 weeks if we are able to find drug replacement  -will test BRCA1/2    I spent 30 minutes counseling the patient face to face. The total time spent in the appointment was 40 minutes and more than 50% was on counseling.  This document serves as a record of services personally performed by Truitt Merle, MD. It was created on her behalf by Joslyn Devon, a trained medical scribe. The creation of this record is based on the scribe's personal observations and the provider's statements to them. This document has been checked and approved by the attending provider.   I have reviewed the above documentation for accuracy and completeness and I agree with the above.    Truitt Merle, MD 11/28/2016

## 2016-11-25 ENCOUNTER — Other Ambulatory Visit (HOSPITAL_BASED_OUTPATIENT_CLINIC_OR_DEPARTMENT_OTHER): Payer: Medicare HMO

## 2016-11-25 ENCOUNTER — Ambulatory Visit (HOSPITAL_BASED_OUTPATIENT_CLINIC_OR_DEPARTMENT_OTHER): Payer: Medicare HMO

## 2016-11-25 ENCOUNTER — Other Ambulatory Visit: Payer: Self-pay | Admitting: *Deleted

## 2016-11-25 VITALS — BP 167/92 | HR 76 | Temp 98.6°F | Resp 20

## 2016-11-25 DIAGNOSIS — Z95828 Presence of other vascular implants and grafts: Secondary | ICD-10-CM

## 2016-11-25 DIAGNOSIS — C50811 Malignant neoplasm of overlapping sites of right female breast: Secondary | ICD-10-CM | POA: Diagnosis not present

## 2016-11-25 DIAGNOSIS — Z452 Encounter for adjustment and management of vascular access device: Secondary | ICD-10-CM | POA: Diagnosis not present

## 2016-11-25 DIAGNOSIS — C50911 Malignant neoplasm of unspecified site of right female breast: Secondary | ICD-10-CM

## 2016-11-25 LAB — CBC WITH DIFFERENTIAL/PLATELET
BASO%: 0.5 % (ref 0.0–2.0)
BASOS ABS: 0 10*3/uL (ref 0.0–0.1)
EOS ABS: 0 10*3/uL (ref 0.0–0.5)
EOS%: 0.8 % (ref 0.0–7.0)
HCT: 28.1 % — ABNORMAL LOW (ref 34.8–46.6)
HEMOGLOBIN: 8.7 g/dL — AB (ref 11.6–15.9)
LYMPH%: 10.5 % — ABNORMAL LOW (ref 14.0–49.7)
MCH: 28.5 pg (ref 25.1–34.0)
MCHC: 31 g/dL — ABNORMAL LOW (ref 31.5–36.0)
MCV: 92.1 fL (ref 79.5–101.0)
MONO#: 0.8 10*3/uL (ref 0.1–0.9)
MONO%: 21.8 % — ABNORMAL HIGH (ref 0.0–14.0)
NEUT#: 2.5 10*3/uL (ref 1.5–6.5)
NEUT%: 66.4 % (ref 38.4–76.8)
Platelets: 128 10*3/uL — ABNORMAL LOW (ref 145–400)
RBC: 3.05 10*6/uL — ABNORMAL LOW (ref 3.70–5.45)
RDW: 15.5 % — AB (ref 11.2–14.5)
WBC: 3.7 10*3/uL — ABNORMAL LOW (ref 3.9–10.3)
lymph#: 0.4 10*3/uL — ABNORMAL LOW (ref 0.9–3.3)

## 2016-11-25 LAB — COMPREHENSIVE METABOLIC PANEL
ALBUMIN: 3.6 g/dL (ref 3.5–5.0)
ALK PHOS: 75 U/L (ref 40–150)
ALT: 14 U/L (ref 0–55)
AST: 18 U/L (ref 5–34)
Anion Gap: 7 mEq/L (ref 3–11)
BUN: 28.4 mg/dL — AB (ref 7.0–26.0)
CALCIUM: 9.4 mg/dL (ref 8.4–10.4)
CO2: 27 mEq/L (ref 22–29)
Chloride: 109 mEq/L (ref 98–109)
Creatinine: 1.4 mg/dL — ABNORMAL HIGH (ref 0.6–1.1)
EGFR: 43 mL/min/{1.73_m2} — AB (ref >90)
Glucose: 84 mg/dl (ref 70–140)
POTASSIUM: 4.4 meq/L (ref 3.5–5.1)
Sodium: 143 mEq/L (ref 136–145)
Total Bilirubin: 0.33 mg/dL (ref 0.20–1.20)
Total Protein: 7.5 g/dL (ref 6.4–8.3)

## 2016-11-25 MED ORDER — SODIUM CHLORIDE 0.9% FLUSH
10.0000 mL | INTRAVENOUS | Status: DC | PRN
  Administered 2016-11-25: 10 mL via INTRAVENOUS
  Filled 2016-11-25: qty 10

## 2016-11-25 MED ORDER — HEPARIN SOD (PORK) LOCK FLUSH 100 UNIT/ML IV SOLN
500.0000 [IU] | Freq: Once | INTRAVENOUS | Status: AC | PRN
  Administered 2016-11-25: 500 [IU] via INTRAVENOUS
  Filled 2016-11-25: qty 5

## 2016-11-25 MED ORDER — ALTEPLASE 2 MG IJ SOLR
2.0000 mg | Freq: Once | INTRAMUSCULAR | Status: AC | PRN
  Administered 2016-11-25: 2 mg
  Filled 2016-11-25: qty 2

## 2016-11-25 NOTE — Patient Instructions (Signed)
Implanted Port Home Guide An implanted port is a type of central line that is placed under the skin. Central lines are used to provide IV access when treatment or nutrition needs to be given through a person's veins. Implanted ports are used for long-term IV access. An implanted port may be placed because:  You need IV medicine that would be irritating to the small veins in your hands or arms.  You need long-term IV medicines, such as antibiotics.  You need IV nutrition for a long period.  You need frequent blood draws for lab tests.  You need dialysis.  Implanted ports are usually placed in the chest area, but they can also be placed in the upper arm, the abdomen, or the leg. An implanted port has two main parts:  Reservoir. The reservoir is round and will appear as a small, raised area under your skin. The reservoir is the part where a needle is inserted to give medicines or draw blood.  Catheter. The catheter is a thin, flexible tube that extends from the reservoir. The catheter is placed into a large vein. Medicine that is inserted into the reservoir goes into the catheter and then into the vein.  How will I care for my incision site? Do not get the incision site wet. Bathe or shower as directed by your health care provider. How is my port accessed? Special steps must be taken to access the port:  Before the port is accessed, a numbing cream can be placed on the skin. This helps numb the skin over the port site.  Your health care provider uses a sterile technique to access the port. ? Your health care provider must put on a mask and sterile gloves. ? The skin over your port is cleaned carefully with an antiseptic and allowed to dry. ? The port is gently pinched between sterile gloves, and a needle is inserted into the port.  Only "non-coring" port needles should be used to access the port. Once the port is accessed, a blood return should be checked. This helps ensure that the port  is in the vein and is not clogged.  If your port needs to remain accessed for a constant infusion, a clear (transparent) bandage will be placed over the needle site. The bandage and needle will need to be changed every week, or as directed by your health care provider.  Keep the bandage covering the needle clean and dry. Do not get it wet. Follow your health care provider's instructions on how to take a shower or bath while the port is accessed.  If your port does not need to stay accessed, no bandage is needed over the port.  What is flushing? Flushing helps keep the port from getting clogged. Follow your health care provider's instructions on how and when to flush the port. Ports are usually flushed with saline solution or a medicine called heparin. The need for flushing will depend on how the port is used.  If the port is used for intermittent medicines or blood draws, the port will need to be flushed: ? After medicines have been given. ? After blood has been drawn. ? As part of routine maintenance.  If a constant infusion is running, the port may not need to be flushed.  How long will my port stay implanted? The port can stay in for as long as your health care provider thinks it is needed. When it is time for the port to come out, surgery will be   done to remove it. The procedure is similar to the one performed when the port was put in. When should I seek immediate medical care? When you have an implanted port, you should seek immediate medical care if:  You notice a bad smell coming from the incision site.  You have swelling, redness, or drainage at the incision site.  You have more swelling or pain at the port site or the surrounding area.  You have a fever that is not controlled with medicine.  This information is not intended to replace advice given to you by your health care provider. Make sure you discuss any questions you have with your health care provider. Document  Released: 06/03/2005 Document Revised: 11/09/2015 Document Reviewed: 02/08/2013 [employer] Interactive Patient Education  2017 [employer]  

## 2016-11-25 NOTE — Progress Notes (Signed)
After 2 hours with cath-flo administered, no blood return on Port-a-cath. Patient agreeable to PIV. After 2 PIV attempts, MD [facility] notified. Per MD [name], patient is not to get chemotherapy treatment today and is to be sent for dye study. Patient understands plan of care and agrees with plan of care. Patient verbalizes understanding that scheduling will call her with dye study appointment. Patient verbalizes understanding to call [facility] with any questions/concerns.

## 2016-11-25 NOTE — Progress Notes (Signed)
Cathflo put in port by RN. Sent to lab for blood draw.

## 2016-11-26 ENCOUNTER — Other Ambulatory Visit: Payer: Self-pay | Admitting: *Deleted

## 2016-11-26 ENCOUNTER — Ambulatory Visit: Payer: Medicare HMO

## 2016-11-27 ENCOUNTER — Encounter (HOSPITAL_COMMUNITY): Payer: Medicare HMO

## 2016-11-27 ENCOUNTER — Ambulatory Visit (HOSPITAL_COMMUNITY): Payer: Medicare HMO

## 2016-11-27 ENCOUNTER — Encounter (HOSPITAL_COMMUNITY)
Admission: RE | Admit: 2016-11-27 | Discharge: 2016-11-27 | Disposition: A | Discharge status: Home / Self Care | Payer: Medicare HMO | Source: Ambulatory Visit | Attending: Hematology | Admitting: Hematology

## 2016-11-27 DIAGNOSIS — C50911 Malignant neoplasm of unspecified site of right female breast: Secondary | ICD-10-CM | POA: Diagnosis not present

## 2016-11-27 DIAGNOSIS — C7951 Secondary malignant neoplasm of bone: Secondary | ICD-10-CM | POA: Diagnosis not present

## 2016-11-27 LAB — GLUCOSE, CAPILLARY: GLUCOSE-CAPILLARY: 90 mg/dL (ref 65–99)

## 2016-11-27 MED ORDER — FLUDEOXYGLUCOSE F - 18 (FDG) INJECTION
8.7100 | Freq: Once | INTRAVENOUS | Status: AC | PRN
  Administered 2016-11-27: 8.71 via INTRAVENOUS

## 2016-11-28 ENCOUNTER — Ambulatory Visit (HOSPITAL_BASED_OUTPATIENT_CLINIC_OR_DEPARTMENT_OTHER): Payer: Medicare HMO | Admitting: Hematology

## 2016-11-28 ENCOUNTER — Encounter (HOSPITAL_COMMUNITY): Payer: Self-pay | Admitting: Interventional Radiology

## 2016-11-28 ENCOUNTER — Ambulatory Visit (HOSPITAL_COMMUNITY)
Admission: RE | Admit: 2016-11-28 | Discharge: 2016-11-28 | Disposition: A | Discharge status: Home / Self Care | Payer: Medicare HMO | Source: Ambulatory Visit | Attending: Hematology | Admitting: Hematology

## 2016-11-28 DIAGNOSIS — Z7189 Other specified counseling: Secondary | ICD-10-CM | POA: Diagnosis not present

## 2016-11-28 DIAGNOSIS — K219 Gastro-esophageal reflux disease without esophagitis: Secondary | ICD-10-CM | POA: Diagnosis not present

## 2016-11-28 DIAGNOSIS — Z95828 Presence of other vascular implants and grafts: Secondary | ICD-10-CM

## 2016-11-28 DIAGNOSIS — I10 Essential (primary) hypertension: Secondary | ICD-10-CM | POA: Diagnosis not present

## 2016-11-28 DIAGNOSIS — D630 Anemia in neoplastic disease: Secondary | ICD-10-CM | POA: Diagnosis not present

## 2016-11-28 DIAGNOSIS — C778 Secondary and unspecified malignant neoplasm of lymph nodes of multiple regions: Secondary | ICD-10-CM

## 2016-11-28 DIAGNOSIS — D6959 Other secondary thrombocytopenia: Secondary | ICD-10-CM

## 2016-11-28 DIAGNOSIS — C7951 Secondary malignant neoplasm of bone: Secondary | ICD-10-CM | POA: Diagnosis not present

## 2016-11-28 DIAGNOSIS — N644 Mastodynia: Secondary | ICD-10-CM | POA: Diagnosis not present

## 2016-11-28 DIAGNOSIS — Z171 Estrogen receptor negative status [ER-]: Secondary | ICD-10-CM

## 2016-11-28 DIAGNOSIS — Z452 Encounter for adjustment and management of vascular access device: Secondary | ICD-10-CM | POA: Insufficient documentation

## 2016-11-28 DIAGNOSIS — D701 Agranulocytosis secondary to cancer chemotherapy: Secondary | ICD-10-CM

## 2016-11-28 DIAGNOSIS — C50911 Malignant neoplasm of unspecified site of right female breast: Secondary | ICD-10-CM | POA: Diagnosis not present

## 2016-11-28 DIAGNOSIS — N183 Chronic kidney disease, stage 3 (moderate): Secondary | ICD-10-CM | POA: Diagnosis not present

## 2016-11-28 HISTORY — PX: IR CV LINE INJECTION: IMG2294

## 2016-11-28 MED ORDER — IOPAMIDOL (ISOVUE-300) INJECTION 61%
INTRAVENOUS | Status: AC
  Filled 2016-11-28: qty 50

## 2016-11-28 MED ORDER — HEPARIN SOD (PORK) LOCK FLUSH 100 UNIT/ML IV SOLN
INTRAVENOUS | Status: AC
  Filled 2016-11-28: qty 5

## 2016-11-28 MED ORDER — HEPARIN SOD (PORK) LOCK FLUSH 100 UNIT/ML IV SOLN
INTRAVENOUS | Status: DC | PRN
  Administered 2016-11-28: 500 [IU]

## 2016-11-28 MED ORDER — TRAMADOL HCL 50 MG PO TABS: 50.0000 mg | ORAL_TABLET | Freq: Three times a day (TID) | ORAL | 1 refills | Status: DC | PRN

## 2016-11-28 MED ORDER — IOPAMIDOL (ISOVUE-300) INJECTION 61%
50.0000 mL | Freq: Once | INTRAVENOUS | Status: AC | PRN
  Administered 2016-11-28: 10 mL via INTRAVENOUS

## 2016-11-28 NOTE — Progress Notes (Signed)
DISCONTINUE ON PATHWAY REGIMEN - Breast   [id number]:Gemcitabine + Carboplatin (1000/2) q21 Days:   A cycle is every 21 days:     Gemcitabine      Carboplatin   **Always confirm dose/schedule in your pharmacy ordering system**    REASON: Disease Progression PRIOR TREATMENT: Off Pathway: Gemcitabine + Carboplatin (1000/2) q21 Days TREATMENT RESPONSE: Progressive Disease (PD)  START ON PATHWAY REGIMEN - Breast     A cycle is every 21 days:     Eribulin mesylate   **Always confirm dose/schedule in your pharmacy ordering system**    Patient Characteristics: Metastatic Chemotherapy, HER2 Negative/Unknown/Equivocal, ER -Verita Schneiders, [facility], Prior Anthracycline or Anthracycline Contraindicated, Prior Taxane, AND No Prior Eribulin Therapeutic Status: Distant Metastases BRCA Mutation Status: Awaiting Test Results ER Status: Negative (-) HER2 Status: Negative (-) Would you be surprised if this patient died  in the next year? I would be surprised if this patient died in the next year PR Status: Negative (-) Line of therapy: [facility] Intent of Therapy: Non-Curative / Palliative Intent, Discussed with Patient

## 2016-12-01 ENCOUNTER — Encounter: Payer: Self-pay | Admitting: Hematology

## 2016-12-01 NOTE — Addendum Note (Signed)
Addended by: Truitt Merle on: 12/01/2016 11:26 AM   Modules accepted: Orders

## 2016-12-04 ENCOUNTER — Telehealth: Payer: Self-pay | Admitting: Hematology

## 2016-12-04 NOTE — Telephone Encounter (Signed)
Request given to Audie Clear to add/override lab for 06/21 @ 7:30 a.m. Appointments scheduled and confirmed with patient's daughter, Rainie Crenshaw. Patient will pick up copty of appointment schedule at tomorrow's appointment. A copy of the appointment schedule and letter was also mailed to patient.

## 2016-12-05 ENCOUNTER — Ambulatory Visit (HOSPITAL_BASED_OUTPATIENT_CLINIC_OR_DEPARTMENT_OTHER): Payer: Medicare HMO

## 2016-12-05 ENCOUNTER — Telehealth: Payer: Self-pay | Admitting: *Deleted

## 2016-12-05 ENCOUNTER — Other Ambulatory Visit (HOSPITAL_BASED_OUTPATIENT_CLINIC_OR_DEPARTMENT_OTHER): Payer: Medicare HMO

## 2016-12-05 ENCOUNTER — Other Ambulatory Visit (HOSPITAL_BASED_OUTPATIENT_CLINIC_OR_DEPARTMENT_OTHER): Payer: Medicare HMO | Admitting: *Deleted

## 2016-12-05 VITALS — BP 140/84 | HR 71 | Temp 99.4°F | Resp 17

## 2016-12-05 DIAGNOSIS — Z452 Encounter for adjustment and management of vascular access device: Secondary | ICD-10-CM

## 2016-12-05 DIAGNOSIS — Z5111 Encounter for antineoplastic chemotherapy: Secondary | ICD-10-CM | POA: Diagnosis not present

## 2016-12-05 DIAGNOSIS — C787 Secondary malignant neoplasm of liver and intrahepatic bile duct: Secondary | ICD-10-CM | POA: Diagnosis not present

## 2016-12-05 DIAGNOSIS — C50911 Malignant neoplasm of unspecified site of right female breast: Secondary | ICD-10-CM | POA: Diagnosis not present

## 2016-12-05 LAB — COMPREHENSIVE METABOLIC PANEL
ALBUMIN: 3.2 g/dL — AB (ref 3.5–5.0)
ALK PHOS: 63 U/L (ref 40–150)
ALT: 11 U/L (ref 0–55)
ANION GAP: 12 meq/L — AB (ref 3–11)
AST: 12 U/L (ref 5–34)
BILIRUBIN TOTAL: 0.37 mg/dL (ref 0.20–1.20)
BUN: 23.6 mg/dL (ref 7.0–26.0)
CO2: 22 meq/L (ref 22–29)
CREATININE: 1.1 mg/dL (ref 0.6–1.1)
Calcium: 8.8 mg/dL (ref 8.4–10.4)
Chloride: 111 mEq/L — ABNORMAL HIGH (ref 98–109)
EGFR: 58 mL/min/{1.73_m2} — ABNORMAL LOW (ref >90)
Glucose: 97 mg/dl (ref 70–140)
Potassium: 3.6 mEq/L (ref 3.5–5.1)
Sodium: 145 mEq/L (ref 136–145)
TOTAL PROTEIN: 6.7 g/dL (ref 6.4–8.3)

## 2016-12-05 LAB — CBC WITH DIFFERENTIAL/PLATELET
BASO%: 1.3 % (ref 0.0–2.0)
Basophils Absolute: 0 10*3/uL (ref 0.0–0.1)
EOS ABS: 0.1 10*3/uL (ref 0.0–0.5)
EOS%: 2.9 % (ref 0.0–7.0)
HCT: 25.4 % — ABNORMAL LOW (ref 34.8–46.6)
HEMOGLOBIN: 8.2 g/dL — AB (ref 11.6–15.9)
LYMPH#: 0.3 10*3/uL — AB (ref 0.9–3.3)
LYMPH%: 12.7 % — AB (ref 14.0–49.7)
MCH: 29.1 pg (ref 25.1–34.0)
MCHC: 32.5 g/dL (ref 31.5–36.0)
MCV: 89.5 fL (ref 79.5–101.0)
MONO#: 0.5 10*3/uL (ref 0.1–0.9)
MONO%: 19.4 % — ABNORMAL HIGH (ref 0.0–14.0)
NEUT%: 63.7 % (ref 38.4–76.8)
NEUTROS ABS: 1.6 10*3/uL (ref 1.5–6.5)
PLATELETS: 231 10*3/uL (ref 145–400)
RBC: 2.83 10*6/uL — AB (ref 3.70–5.45)
RDW: 18.4 % — AB (ref 11.2–14.5)
WBC: 2.5 10*3/uL — AB (ref 3.9–10.3)

## 2016-12-05 MED ORDER — SODIUM CHLORIDE 0.9% FLUSH
3.0000 mL | INTRAVENOUS | Status: DC | PRN
  Filled 2016-12-05: qty 10

## 2016-12-05 MED ORDER — SODIUM CHLORIDE 0.9 % IV SOLN
Freq: Once | INTRAVENOUS | Status: AC
  Administered 2016-12-05: 10:00:00 via INTRAVENOUS

## 2016-12-05 MED ORDER — HEPARIN SOD (PORK) LOCK FLUSH 100 UNIT/ML IV SOLN
500.0000 [IU] | Freq: Once | INTRAVENOUS | Status: AC | PRN
  Administered 2016-12-05: 500 [IU]
  Filled 2016-12-05: qty 5

## 2016-12-05 MED ORDER — SODIUM CHLORIDE 0.9% FLUSH
10.0000 mL | INTRAVENOUS | Status: DC | PRN
  Administered 2016-12-05: 10 mL
  Filled 2016-12-05: qty 10

## 2016-12-05 MED ORDER — PROCHLORPERAZINE MALEATE 10 MG PO TABS
ORAL_TABLET | ORAL | Status: AC
  Filled 2016-12-05: qty 1

## 2016-12-05 MED ORDER — SODIUM CHLORIDE 0.9 % IV SOLN
1.3700 mg/m2 | Freq: Once | INTRAVENOUS | Status: AC
  Administered 2016-12-05: 2.6 mg via INTRAVENOUS
  Filled 2016-12-05: qty 5.2

## 2016-12-05 MED ORDER — ALTEPLASE 2 MG IJ SOLR
2.0000 mg | Freq: Once | INTRAMUSCULAR | Status: AC | PRN
  Administered 2016-12-05: 2 mg
  Filled 2016-12-05: qty 2

## 2016-12-05 MED ORDER — PROCHLORPERAZINE MALEATE 10 MG PO TABS
10.0000 mg | ORAL_TABLET | Freq: Once | ORAL | Status: AC
  Administered 2016-12-05: 10 mg via ORAL

## 2016-12-05 NOTE — Patient Instructions (Signed)
[facility] Discharge Instructions for Patients Receiving Chemotherapy  Today you received the following chemotherapy agents:  Eribulin (halovan)  To help prevent nausea and vomiting after your treatment, we encourage you to take your nausea medication as prescribed.   If you develop nausea and vomiting that is not controlled by your nausea medication, call the clinic.   BELOW ARE SYMPTOMS THAT SHOULD BE REPORTED IMMEDIATELY:  *FEVER GREATER THAN 100.5 F  *CHILLS WITH OR WITHOUT FEVER  NAUSEA AND VOMITING THAT IS NOT CONTROLLED WITH YOUR NAUSEA MEDICATION  *UNUSUAL SHORTNESS OF BREATH  *UNUSUAL BRUISING OR BLEEDING  TENDERNESS IN MOUTH AND THROAT WITH OR WITHOUT PRESENCE OF ULCERS  *URINARY PROBLEMS  *BOWEL PROBLEMS  UNUSUAL RASH Items with * indicate a potential emergency and should be followed up as soon as possible.  Feel free to call the clinic you have any questions or concerns. The clinic phone number is [phone].  Please show the [place] at check-in to the Emergency Department and triage nurse.     Eribulin solution for injection What is this medicine? ERIBULIN (er e bu lin) is a chemotherapy drug. It is used to treat breast cancer and liposarcoma. This medicine may be used for other purposes; ask your health care provider or pharmacist if you have questions. COMMON BRAND NAME(S): Halaven What should I tell my health care provider before I take this medicine? They need to know if you have any of these conditions: -heart disease -history of irregular heartbeat -kidney disease -liver disease -low blood counts, like low white cell, platelet, or red cell counts -low levels of potassium or magnesium in the blood -an unusual or allergic reaction to eribulin, other medicines, foods, dyes, or preservatives -pregnant or trying to get pregnant -breast-feeding How should I use this medicine? This medicine is for infusion into a vein. It  is given by a health care professional in a hospital or clinic setting. Talk to your pediatrician regarding the use of this medicine in children. Special care may be needed. Overdosage: If you think you have taken too much of this medicine contact a poison control center or emergency room at once. NOTE: This medicine is only for you. Do not share this medicine with others. What if I miss a dose? It is important not to miss your dose. Call your doctor or health care professional if you are unable to keep an appointment. What may interact with this medicine? Do not take this medicine with any of the following medications: -amiodarone -astemizole -arsenic trioxide -bepridil -bretylium -chloroquine -chlorpromazine -cisapride -clarithromycin -dextromethorphan, quinidine -disopyramide -dofetilide -droperidol -dronedarone -erythromycin -grepafloxacin -halofantrine -haloperidol -ibutilide -levomethadyl -mesoridazine -methadone -pentamidine -procainamide -quinidine -pimozide -posaconazole -probucol -propafenone -saquinavir -sotalol -sparfloxacin -terfenadine -thioridazine -troleandomycin -ziprasidone This list may not describe all possible interactions. Give your health care provider a list of all the medicines, herbs, non-prescription drugs, or dietary supplements you use. Also tell them if you smoke, drink alcohol, or use illegal drugs. Some items may interact with your medicine. What should I watch for while using this medicine? This drug may make you feel generally unwell. This is not uncommon, as chemotherapy can affect healthy cells as well as cancer cells. Report any side effects. Continue your course of treatment even though you feel ill unless your doctor tells you to stop. Call your doctor or health care professional for advice if you get a fever, chills or sore throat, or other symptoms of a cold or flu. Do not treat yourself.  This drug decreases your body's ability  to fight infections. Try to avoid being around people who are sick. This medicine may increase your risk to bruise or bleed. Call your doctor or health care professional if you notice any unusual bleeding. You may need blood work done while you are taking this medicine. Do not become pregnant while taking this medicine or for 2 weeks after stopping it. Women should inform their doctor if they wish to become pregnant or think they might be pregnant. Men should not father a child while taking this medicine and for 3.5 months after stopping it. There is a potential for serious side effects to an unborn child. Talk to your health care professional or pharmacist for more information. Do not breast-feed an infant while taking this medicine or for 2 weeks after stopping it. What side effects may I notice from receiving this medicine? Side effects that you should report to your doctor or health care professional as soon as possible: -allergic reactions like skin rash, itching or hives, swelling of the face, lips, or tongue -low blood counts - this medicine may decrease the number of white blood cells, red blood cells and platelets. You may be at increased risk for infections and bleeding. -signs of infection - fever or chills, cough, sore throat, pain or difficulty passing urine -signs of decreased platelets or bleeding - bruising, pinpoint red spots on the skin, black, tarry stools, blood in the urine -signs of decreased red blood cells - unusually weak or tired, fainting spells, lightheadedness -pain, tingling, numbness in the hands or feet Side effects that usually do not require medical attention (report to your doctor or health care professional if they continue or are bothersome): -constipation -hair loss -headache -loss of appetite -muscle or joint pain -nausea, vomiting -stomach pain This list may not describe all possible side effects. Call your doctor for medical advice about side effects. You  may report side effects to FDA at 1-800-FDA-1088. Where should I keep my medicine? This drug is given in a hospital or clinic and will not be stored at home. NOTE: This sheet is a summary. It may not cover all possible information. If you have questions about this medicine, talk to your doctor, pharmacist, or health care provider.  2018 Elsevier/Gold Standard (2012019-02-02 10:11:26)

## 2016-12-05 NOTE — Telephone Encounter (Signed)
[place] notified triage nurse of need [name] has for answers to questions for [facility] Prior Authorization.   Per Collier Salina scheduled for December 11, 2016 is not authorized.  Medical Director needs a reason why this drug has been ordered due to [facility] not given for breast cancer."   Will notify staff of this request.  Will return call with answer as requested.

## 2016-12-06 LAB — CANCER ANTIGEN 27.29: CA 27.29: 239.8 U/mL — ABNORMAL HIGH (ref 0.0–38.6)

## 2016-12-11 ENCOUNTER — Other Ambulatory Visit (HOSPITAL_BASED_OUTPATIENT_CLINIC_OR_DEPARTMENT_OTHER): Payer: Medicare HMO

## 2016-12-11 ENCOUNTER — Encounter: Payer: Self-pay | Admitting: Pharmacist

## 2016-12-11 ENCOUNTER — Ambulatory Visit (HOSPITAL_BASED_OUTPATIENT_CLINIC_OR_DEPARTMENT_OTHER): Payer: Medicare HMO

## 2016-12-11 ENCOUNTER — Ambulatory Visit (HOSPITAL_BASED_OUTPATIENT_CLINIC_OR_DEPARTMENT_OTHER): Payer: Medicare HMO | Admitting: Nurse Practitioner

## 2016-12-11 VITALS — BP 168/76 | HR 81 | Temp 97.8°F | Resp 19 | Ht 64.0 in | Wt 179.2 lb

## 2016-12-11 DIAGNOSIS — D701 Agranulocytosis secondary to cancer chemotherapy: Secondary | ICD-10-CM | POA: Diagnosis not present

## 2016-12-11 DIAGNOSIS — D631 Anemia in chronic kidney disease: Secondary | ICD-10-CM

## 2016-12-11 DIAGNOSIS — I10 Essential (primary) hypertension: Secondary | ICD-10-CM | POA: Diagnosis not present

## 2016-12-11 DIAGNOSIS — Z452 Encounter for adjustment and management of vascular access device: Secondary | ICD-10-CM

## 2016-12-11 DIAGNOSIS — C50911 Malignant neoplasm of unspecified site of right female breast: Secondary | ICD-10-CM

## 2016-12-11 DIAGNOSIS — N183 Chronic kidney disease, stage 3 (moderate): Secondary | ICD-10-CM

## 2016-12-11 DIAGNOSIS — D6959 Other secondary thrombocytopenia: Secondary | ICD-10-CM

## 2016-12-11 DIAGNOSIS — Z170 Estrogen receptor positive status [ER+]: Secondary | ICD-10-CM

## 2016-12-11 DIAGNOSIS — Z79899 Other long term (current) drug therapy: Secondary | ICD-10-CM

## 2016-12-11 DIAGNOSIS — Z95828 Presence of other vascular implants and grafts: Secondary | ICD-10-CM

## 2016-12-11 LAB — CBC WITH DIFFERENTIAL/PLATELET
BASO%: 1.1 % (ref 0.0–2.0)
Basophils Absolute: 0 10*3/uL (ref 0.0–0.1)
EOS%: 2.2 % (ref 0.0–7.0)
Eosinophils Absolute: 0 10*3/uL (ref 0.0–0.5)
HEMATOCRIT: 25.8 % — AB (ref 34.8–46.6)
HEMOGLOBIN: 7.9 g/dL — AB (ref 11.6–15.9)
LYMPH#: 0.5 10*3/uL — AB (ref 0.9–3.3)
LYMPH%: 28.3 % (ref 14.0–49.7)
MCH: 27.7 pg (ref 25.1–34.0)
MCHC: 30.6 g/dL — ABNORMAL LOW (ref 31.5–36.0)
MCV: 90.5 fL (ref 79.5–101.0)
MONO#: 0.1 10*3/uL (ref 0.1–0.9)
MONO%: 4.3 % (ref 0.0–14.0)
NEUT%: 64.1 % (ref 38.4–76.8)
NEUTROS ABS: 1.2 10*3/uL — AB (ref 1.5–6.5)
PLATELETS: 191 10*3/uL (ref 145–400)
RBC: 2.85 10*6/uL — ABNORMAL LOW (ref 3.70–5.45)
RDW: 16.1 % — AB (ref 11.2–14.5)
WBC: 1.8 10*3/uL — AB (ref 3.9–10.3)

## 2016-12-11 MED ORDER — SODIUM CHLORIDE 0.9% FLUSH
10.0000 mL | INTRAVENOUS | Status: DC | PRN
  Administered 2016-12-11: 10 mL via INTRAVENOUS
  Filled 2016-12-11: qty 10

## 2016-12-11 MED ORDER — HEPARIN SOD (PORK) LOCK FLUSH 100 UNIT/ML IV SOLN
500.0000 [IU] | Freq: Once | INTRAVENOUS | Status: AC | PRN
  Administered 2016-12-11: 500 [IU] via INTRAVENOUS
  Filled 2016-12-11: qty 5

## 2016-12-11 NOTE — Progress Notes (Signed)
Telephone documentation  Study code: [id number]  Attempted to call patient and review with her the pharmacogenetic information that was obtained for the "Pharmacogenetic analysis of toxicities related to administration of taxanes in breast cancer patients" study. Unable to reach patient and unable to leave voicemail.   Darl Pikes, PharmD, [place] Clinical Pharmacist- Oncology Pharmacy Resident [phone]

## 2016-12-11 NOTE — Progress Notes (Signed)
  [name] OFFICE PROGRESS NOTE   Diagnosis:  Right breast cancer  CURRENT THERAPY: Due to cancer progression change treatment to Eribulin infusion 2 weeks on and 1 week off, and Keytruda every 3 weeks, starting 6/21 (Keytruda beginning 12/12/2016)  INTERVAL HISTORY:   [name] returns as scheduled. She completed cycle 1 day 1 Eribulin on 12/05/2016. She had some arthralgias. Mild nausea. No vomiting. One loose stool which she attributes to her diet. No significant shortness of breath. No chest pain. Both breasts are "hard". She has intermittent pain involving the right breast. She takes tramadol as needed with partial relief.  Objective:  Vital signs in last 24 hours:  Blood pressure (!) 168/76, pulse 81, temperature 97.8 F (36.6 C), temperature source Oral, resp. rate 19, height '5\' 4"'$  (1.626 m), weight 179 lb 3.2 oz (81.3 kg), SpO2 100 %.    HEENT: No thrush or ulcers. Resp: Lungs clear bilaterally. Cardio: Regular rate and rhythm. GI: Abdomen soft and nontender. No hepatomegaly. Vascular: Trace lower leg edema bilaterally. Neuro: Alert and oriented. Breasts: Both breasts are firm.  Port-A-Cath without erythema.   Lab Results:  Lab Results  Component Value Date   WBC 1.8 (L) 12/11/2016   HGB 7.9 (L) 12/11/2016   HCT 25.8 (L) 12/11/2016   MCV 90.5 12/11/2016   PLT 191 12/11/2016   NEUTROABS 1.2 (L) 12/11/2016    Imaging:  No results found.  Medications: I have reviewed the patient's current medications.  Assessment/Plan: 1. 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 Plan is to begin [place] every 3 weeks beginning 12/05/2016. 2. Anemia secondary to chronic disease, CKD and cancer 3. History of neutropenia and thrombocytopenia related to chemotherapy 4. CKD stage III 5. Hypertension,  GERD, obesity, sleep apnea 6. Right breast pain. She takes tramadol as needed.   Disposition: [name] appears stable. She completed cycle 1 day 1 Eribulin 12/05/2016. She developed arthralgias but otherwise seems to have tolerated well. Plan to proceed with day 8 tomorrow. She is also scheduled to receive Pembrolizumab tomorrow.   Hemoglobin continues to slowly decline. She is asymptomatic. We discussed a blood transfusion. She declines transfusion support for now. We reviewed signs/symptoms suggestive of progressive anemia. She understands to contact the office should she develop any of these.   She will return for a follow-up visit 12/26/2016.     Ned Card ANP/GNP-BC   12/11/2016  3:50 PM

## 2016-12-12 ENCOUNTER — Ambulatory Visit (HOSPITAL_BASED_OUTPATIENT_CLINIC_OR_DEPARTMENT_OTHER): Payer: Medicare HMO

## 2016-12-12 VITALS — BP 132/69 | HR 84 | Temp 98.7°F | Resp 17

## 2016-12-12 DIAGNOSIS — C50911 Malignant neoplasm of unspecified site of right female breast: Secondary | ICD-10-CM

## 2016-12-12 DIAGNOSIS — Z5112 Encounter for antineoplastic immunotherapy: Secondary | ICD-10-CM | POA: Diagnosis not present

## 2016-12-12 DIAGNOSIS — C787 Secondary malignant neoplasm of liver and intrahepatic bile duct: Secondary | ICD-10-CM

## 2016-12-12 LAB — TSH: TSH: 1.759 m(IU)/L (ref 0.308–3.960)

## 2016-12-12 MED ORDER — SODIUM CHLORIDE 0.9% FLUSH
10.0000 mL | INTRAVENOUS | Status: DC | PRN
  Administered 2016-12-12: 10 mL
  Filled 2016-12-12: qty 10

## 2016-12-12 MED ORDER — SODIUM CHLORIDE 0.9 % IV SOLN
Freq: Once | INTRAVENOUS | Status: AC
  Administered 2016-12-12: 16:00:00 via INTRAVENOUS

## 2016-12-12 MED ORDER — SODIUM CHLORIDE 0.9 % IV SOLN
200.0000 mg | Freq: Once | INTRAVENOUS | Status: AC
  Administered 2016-12-12: 200 mg via INTRAVENOUS
  Filled 2016-12-12: qty 8

## 2016-12-12 MED ORDER — HEPARIN SOD (PORK) LOCK FLUSH 100 UNIT/ML IV SOLN
500.0000 [IU] | Freq: Once | INTRAVENOUS | Status: AC | PRN
  Administered 2016-12-12: 500 [IU]
  Filled 2016-12-12: qty 5

## 2016-12-12 NOTE — Patient Instructions (Signed)
[facility] Discharge Instructions for Patients Receiving Chemotherapy  Today you received the following chemotherapy agent: Keytruda.  To help prevent nausea and vomiting after your treatment, we encourage you to take your nausea medication as directed.   If you develop nausea and vomiting that is not controlled by your nausea medication, call the clinic.   BELOW ARE SYMPTOMS THAT SHOULD BE REPORTED IMMEDIATELY:  *FEVER GREATER THAN 100.5 F  *CHILLS WITH OR WITHOUT FEVER  NAUSEA AND VOMITING THAT IS NOT CONTROLLED WITH YOUR NAUSEA MEDICATION  *UNUSUAL SHORTNESS OF BREATH  *UNUSUAL BRUISING OR BLEEDING  TENDERNESS IN MOUTH AND THROAT WITH OR WITHOUT PRESENCE OF ULCERS  *URINARY PROBLEMS  *BOWEL PROBLEMS  UNUSUAL RASH Items with * indicate a potential emergency and should be followed up as soon as possible.  Feel free to call the clinic you have any questions or concerns. The clinic phone number is [phone].  Please show the [place] at check-in to the Emergency Department and triage nurse.   Beryle Flock ) Pembrolizumab injection What is this medicine? PEMBROLIZUMAB (pem broe liz ue mab) is a monoclonal antibody. It is used to treat melanoma, head and neck cancer, Hodgkin lymphoma, non-small cell lung cancer, urothelial cancer, stomach cancer, and cancers that have a certain genetic condition. This medicine may be used for other purposes; ask your health care provider or pharmacist if you have questions. COMMON BRAND NAME(S): Keytruda What should I tell my health care provider before I take this medicine? They need to know if you have any of these conditions: -diabetes -immune system problems -inflammatory bowel disease -liver disease -lung or breathing disease -lupus -organ transplant -an unusual or allergic reaction to pembrolizumab, other medicines, foods, dyes, or preservatives -pregnant or trying to get pregnant -breast-feeding How  should I use this medicine? This medicine is for infusion into a vein. It is given by a health care professional in a hospital or clinic setting. A special MedGuide will be given to you before each treatment. Be sure to read this information carefully each time. Talk to your pediatrician regarding the use of this medicine in children. While this drug may be prescribed for selected conditions, precautions do apply. Overdosage: If you think you have taken too much of this medicine contact a poison control center or emergency room at once. NOTE: This medicine is only for you. Do not share this medicine with others. What if I miss a dose? It is important not to miss your dose. Call your doctor or health care professional if you are unable to keep an appointment. What may interact with this medicine? Interactions have not been studied. Give your health care provider a list of all the medicines, herbs, non-prescription drugs, or dietary supplements you use. Also tell them if you smoke, drink alcohol, or use illegal drugs. Some items may interact with your medicine. This list may not describe all possible interactions. Give your health care provider a list of all the medicines, herbs, non-prescription drugs, or dietary supplements you use. Also tell them if you smoke, drink alcohol, or use illegal drugs. Some items may interact with your medicine. What should I watch for while using this medicine? Your condition will be monitored carefully while you are receiving this medicine. You may need blood work done while you are taking this medicine. Do not become pregnant while taking this medicine or for 4 months after stopping it. Women should inform their doctor if they wish to become pregnant or  think they might be pregnant. There is a potential for serious side effects to an unborn child. Talk to your health care professional or pharmacist for more information. Do not breast-feed an infant while taking this  medicine or for 4 months after the last dose. What side effects may I notice from receiving this medicine? Side effects that you should report to your doctor or health care professional as soon as possible: -allergic reactions like skin rash, itching or hives, swelling of the face, lips, or tongue -bloody or black, tarry -breathing problems -changes in vision -chest pain -chills -constipation -cough -dizziness or feeling faint or lightheaded -fast or irregular heartbeat -fever -flushing -hair loss -low blood counts - this medicine may decrease the number of white blood cells, red blood cells and platelets. You may be at increased risk for infections and bleeding. -muscle pain -muscle weakness -persistent headache -signs and symptoms of high blood sugar such as dizziness; dry mouth; dry skin; fruity breath; nausea; stomach pain; increased hunger or thirst; increased urination -signs and symptoms of kidney injury like trouble passing urine or change in the amount of urine -signs and symptoms of liver injury like dark urine, light-colored stools, loss of appetite, nausea, right upper belly pain, yellowing of the eyes or skin -stomach pain -sweating -weight loss Side effects that usually do not require medical attention (report to your doctor or health care professional if they continue or are bothersome): -decreased appetite -diarrhea -tiredness This list may not describe all possible side effects. Call your doctor for medical advice about side effects. You may report side effects to FDA at 1-800-FDA-1088. Where should I keep my medicine? This drug is given in a hospital or clinic and will not be stored at home. NOTE: This sheet is a summary. It may not cover all possible information. If you have questions about this medicine, talk to your doctor, pharmacist, or health care provider.  2018 Elsevier/Gold Standard (2016-03-12 12:29:36)

## 2016-12-12 NOTE — Progress Notes (Signed)
Per MD, hold Halaven for today due to [place] of 1.2. Proceed with Fransisco Hertz, BSN, RN 12/12/2016 2:53 PM

## 2016-12-13 NOTE — Progress Notes (Signed)
Telephone documentation  Study code: [id number]  Attempted again to call patient and review with her the pharmacogenetic information that was obtained for the "Pharmacogenetic analysis of toxicities related to administration of taxanes in breast cancer patients" study. Unable to reach patient and unable to leave voicemail.   Darl Pikes, PharmD, [place] Clinical Pharmacist- Oncology Pharmacy Resident [phone]

## 2016-12-24 NOTE — Progress Notes (Signed)
[name]  [phone] [phone]  Clinic Follow Up Note   Patient Care Team: [name], MD as PCP - General (Internal Medicine) 12/26/2016   CHIEF COMPLAINTS:  Follow up right breast cancer   Oncology History   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 [facility])   Staging form: Breast, AJCC 7th Edition   - Clinical stage from 11/30/2015: Stage IV (T4d, N3c, M1) - Signed by Truitt Merle, MD on 01/05/2016      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 [facility])   11/30/2015 Initial Diagnosis    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 [facility])      11/30/2015 Initial Biopsy    Right axilla lymph node biopsy showed metastatic poorly differentiated adenocarcinoma, IHC positive for CK AE1/AE3, CK7, E-cadherin, CDX-2, and focally very weak to equivocal staining for ER      12/21/2015 Imaging    Bilateral breast MRI showed extensive non-mass enhancement involving the entire right breast, right axillary, supraclavicular, and internal mammary adenopathy, new left axillary adenopathy      12/27/2015 Mammogram    Diagnostic mammogram and ultrasound of right breast and axilla showed diffuse skin thickening and increased density, no focal mass, numerous enlarged right axillary lymph node, the largest measuring 5.3cm.       12/27/2015 Initial Biopsy    Right breast core needle biopsy showed invasive ductal carcinoma, and DCIS, lymphovascular invasion identified      12/27/2015 Receptors her2    ER negative, PR negative, Ki-67 70%, HER-2 negative      01/03/2016 Imaging    PET scan showed diffuse asymmetric right breast hypermetabolic soft tissue density and skin thickening, hypermetabolic metastatic lymphadenopathy in bilateral axillary and subpectoral regions, right supraclavicular region and the right cervical level 5      01/10/2016 - 05/28/2016 Chemotherapy    Weekly Taxol 80 mg/m, second cycle was  postponed to 8/22 due to pt's non-compliance, stopped due to disease progression. She developed mild peripheral neuropathy in early Dec 2017.      02/07/2016 Pathology Results    Left axillary lymph node biopsy showed metastatic poorly differentiated carcinoma, triple negative.      04/24/2016 Imaging    CT CHEST, ABDOMEN AND PELVIS WITH CONTRAST IMPRESSION: 1. Bulky right axillary and mild right retropectoral and right supraclavicular lymphadenopathy is mildly decreased since 01/03/2016 PET-CT. 2. Left axillary lymphadenopathy is mildly increased. 3. Diffuse asymmetric thickening of the skin and fibroglandular soft tissues of the right breast is not appreciably changed. 4. No definite additional sites of metastatic disease in the chest, abdomen or pelvis. 5. Subcentimeter right liver lobe lesion is too small to characterize and is probably stable. 6. Additional findings include aortic atherosclerosis, 1 vessel coronary atherosclerosis, tiny hiatal hernia and myomatous uterus.      06/05/2016 Imaging    PET scan showed progressive right breast mass, extending in size and metabolic activity. Enlarging bilateral axillary adenopathy likewise in increased activity. A right supraclavicular lymph node is slightly smaller than before, no other new metastasis.      06/21/2016 - 08/12/2016 Chemotherapy    Adriamycin 60 mg/m, and Cytoxan 600 mg/m, every 2-3 weeks, for a total of 4 cycles.       08/23/2016 PET scan    1. Response to therapy. Significant decrease in size and hypermetabolism of right breast mass and bilateral axillary nodal metastasis. Hypermetabolism within a right supraclavicular node has resolved.  2. New soft tissue fullness and hypermetabolism within the lateral left breast, suspicious for either metastatic disease or new primary. Mastitis could look similar. 3. No extrathoracic metastatic disease identified. 4.  Coronary artery atherosclerosis. Aortic atherosclerosis. 5.  Diffuse marrow hypermetabolism is likely related to stimulation by chemotherapy. This decreases sensitivity for osseous metastasis.      09/09/2016 -  Chemotherapy    Carboplatin AUC 2, and gemcitabine 1000 mg/m, on day 1 and 8 every 21 days, dose reduced from cycle 2 due to significant neutropenia and thrombocytopenia. Neulasta added on day 9 from cycle 2 and granix added day 2 cycle 2  Due to cancer progression change treatment to Eribulin infusion 2 weeks on and 1 week starting 6/21       11/27/2016 PET scan    PET 11/27/16 IMPRESSION: 1. Interval progression of disease. 2. There is increased hypermetabolic activity within bilateral axillary and right pectoral lymph nodes which are similar in overall size to the prior study. 3. There is also extensive hypermetabolic activity throughout both breasts which is nodular and progressive, concerning for local extension or inflammatory breast cancer. 4. New multifocal hypermetabolic osseous metastatic disease. 5. No evidence of metastatic disease to the lungs or within the abdomen or pelvis.        HISTORY OF PRESENTING ILLNESS (12/14/2015) :  [name] 71 y.o. 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 She is accompanied by her husband to the clinic today.  She noticed a lump at right axilla in 08/2015, no pain or other complains, she also report right breast swelling, no pain, skin erythema, nipple discharge or other complain. She otherwise feels well. She was initially seen at urgent care in March, then was referred to establish her care with prior care physician at [facility] internal medicine center. Her screening mammogram was negative in January 2017. She subsequently underwent CT chest on 09/27/2015 which showed enlarged and inflamed right breast, bulky right axillary lymph nodes. She was referred to breast Center for diagnostic mammogram and ultrasound of right breast, which was negative.  She underwent ultrasound guided right axillary node biopsy on 12/05/2015, which reviewed port differentiated carcinoma. ER weakly focally positive.  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 She works independently. She lives with her husband. She denies any fever, night sweats, chills, or skin itchiness.  CURRENT THERAPY: 4th line Eribulin infusion 2 weeks on and 1 week off, and Keytruda every 3 weeks, starting 6/21  INTERIM HISTORY:  LAnyelinreturns for follow up. She presents to the clinic today with her husband. She has worsening pain in her breast. On scale, 10/10 pain. She does not think Tramadol is not helping her enough. She can not walk as well as she used to. When she takes a bath, it burns In the area of her breast where she has skin breakdown.  MEDICAL HISTORY:  Past Medical History:  Diagnosis Date  . Arthritis   . GERD (gastroesophageal reflux disease)   . Hypertension   . Shortness of breath   . Sleep apnea   . Stroke ([facility]  SURGICAL HISTORY: Past Surgical History:  Procedure Laterality Date  . IR CV LINE INJECTION  11/28/2016  . IR GENERIC HISTORICAL  06/13/2016   IR US GUIDE VASC ACCESS LEFT 06/13/2016 Corrie Mckusick, DO [facility]-INTER[facility]  . IR GENERIC HISTORICAL  06/13/2016   IR FLUORO GUIDE PORT INSERTION LEFT 06/13/2016 Corrie Mckusick, DO [facility]  . NO PAST SURGERIES      SOCIAL HISTORY: Social History   Social History  . Marital status: Married    Spouse name: N/A  . Number of children: 4  . Years of education: 8 TH   Occupational History  . Not on file.   Social History Main Topics  . Smoking status: Former Smoker    Packs/day: 0.50    Years:  1.00    Types: Cigarettes    Start date: 06/17/1968    Quit date: 06/17/1973  . Smokeless tobacco: Never Used     Comment: Quit in 64s  . Alcohol use No     Comment: former drinker for approximately 10 years  . Drug use: No  . Sexual activity: Not on file   Other Topics Concern  . Not on file   Social History Narrative   Patient is married with 3 children still living and 1 deceased son.   Patient is right handed.   Patient has 8 th grade education.   Patient drinks 2-3 servings daily.    FAMILY HISTORY: Family History  Problem Relation Age of Onset  . Heart disease Mother   . Cancer Father 51       throat cancer  . Cancer Cousin        breast cancer    ALLERGIES:  has No Known Allergies.  MEDICATIONS:  Current Outpatient Prescriptions  Medication Sig Dispense Refill  . amLODipine (NORVASC) 10 MG tablet Take 1 tablet (10 mg total) by mouth daily. 90 tablet 3  . aspirin EC 81 MG tablet Take 1 tablet (81 mg total) by mouth daily. 90 tablet 3  . atorvastatin (LIPITOR) 20 MG tablet TAKE 1 TABLET EVERY DAY (Patient taking differently: Take 20 mg by mouth daily) 90 tablet 3  . lidocaine-prilocaine (EMLA) cream Apply to port @ 1 hour prior to treatment. 30 g 1  . metoprolol tartrate (LOPRESSOR) 25 MG tablet TAKE 1 TABLET EVERY DAY 90 tablet 3  . ondansetron (ZOFRAN) 8 MG tablet Take by mouth every 8 (eight) hours as needed for nausea or vomiting.    . prochlorperazine (COMPAZINE) 10 MG tablet Take 10 mg by mouth every 6 (six) hours as needed for nausea or vomiting.    . traMADol (ULTRAM) 50 MG tablet Take 1 tablet (50 mg total) by mouth every 8 (eight) hours as needed. 60 tablet 1   No current facility-administered medications for this visit.    Facility-Administered Medications Ordered in Other Visits  Medication Dose Route Frequency Provider Last Rate Last Dose  . sodium chloride flush (NS) 0.9 % injection 10 mL  10 mL Intravenous PRN Truitt Merle, MD   10 mL at 08/12/16 1754    . sodium chloride flush (NS) 0.9 % injection 10 mL  10 mL Intravenous PRN Truitt Merle, MD   10 mL at 11/18/16 1516       REVIEW OF SYSTEMS:  Constitutional: Denies fevers, chills or abnormal night sweats   Eyes: Denies blurriness of vision, double vision or watery eyes Ears, nose, mouth, throat, and face: Denies mucositis  Respiratory: Denies cough, dyspnea or wheezes Cardiovascular:  Denies palpitation, chest discomfort  Gastrointestinal:  Denies nausea, heartburn or change in bowel habits  Skin: no skin rashes.  Lymphatics: Denies new lymphadenopathy or easy bruising Neurological: Negative Behavioral/Psych: Mood is stable, no new changes (+) numbness and tingling hands and feet, overall much improved  Breast: (+) pain/hardening in left breast (+) right breast is hardened (+)10/10 pain in right breast All other systems were reviewed with the patient and are negative.   PHYSICAL EXAMINATION:  ECOG PERFORMANCE STATUS: 1  Vitals:   12/26/16 1156  BP: (!) 166/65  Pulse: 88  Resp: 18  Temp: 97.7 F (36.5 C)   Filed Weights   12/26/16 1156  Weight: 171 lb 6.4 oz (77.7 kg)     GENERAL:alert, no distress and comfortable SKIN: skin color, texture, turgor are normal, no rashes or significant lesions EYES: normal, conjunctiva are pink and non-injected, sclera clear OROPHARYNX:no exudate, no erythema and lips, buccal mucosa, and tongue normal  NECK: supple, thyroid normal size, non-tender, without nodularity LYMPH:  no palpable lymphadenopathy in the cervical, axillary or inguinal LUNGS: clear to auscultation and percussion with normal breathing effort HEART: regular rate & rhythm and no murmurs and no lower extremity edema ABDOMEN:abdomen soft, non-tender and normal bowel sounds Musculoskeletal:no cyanosis of digits and no clubbing  PSYCH: alert & oriented x 3 with fluent speech NEURO: Decreased vibration sensation on bilateral ankle and wrist. SKIN: EXTREMITIES: Bilateral hand  and lower extremity edema. Breasts: her entire right breast is very firm., with associated skin edema, and there are 2 small areas in the low part of breast showed is erythematous, concerning for direct a skin lesion from the tumor. There is a 4X5 cm lymph node at the anterior right axilla, close to right chest wall of breast margin, no tenderness, movable. There is a orange size mass in central left breast, firm, and mildly tender. There is a palpable 2 cm lymph node in the left axilla.   LABORATORY DATA:  I have reviewed the data as listed CBC Latest Ref Rng & Units 12/26/2016 12/11/2016 12/05/2016  WBC 3.9 - 10.3 10e3/uL 3.8(L) 1.8(L) 2.5(L)  Hemoglobin 11.6 - 15.9 g/dL 8.5(L) 7.9(L) 8.2(L)  Hematocrit 34.8 - 46.6 % 27.6(L) 25.8(L) 25.4(L)  Platelets 145 - 400 10e3/uL 200 191 231   CMP Latest Ref Rng & Units 12/26/2016 12/05/2016 11/25/2016  Glucose 70 - 140 mg/dl 96 97 84  BUN 7.0 - 26.0 mg/dL 29.5(H) 23.6 28.4(H)  Creatinine 0.6 - 1.1 mg/dL 1.4(H) 1.1 1.4(H)  Sodium 136 - 145 mEq/L 143 145 143  Potassium 3.5 - 5.1 mEq/L 4.2 3.6 4.4  Chloride 101 - 111 mmol/L - - -  CO2 22 - 29 mEq/L 25 22 27   Calcium 8.4 - 10.4 mg/dL 9.2 8.8 9.4  Total Protein 6.4 - 8.3 g/dL 7.0 6.7 7.5  Total Bilirubin 0.20 - 1.20 mg/dL 0.51 0.37 0.33  Alkaline Phos 40 - 150 U/L 61 63 75  AST 5 - 34 U/L 15 12 18   ALT 0 - 55 U/L 11 11 14    CA27.29:  01/05/2016: 155.9 02/27/2016: 96.7 03/26/2016: 64.7 04/23/2016: 65.4 05/21/2016: 99.2 07/15/2016: 215.4  08/12/2016: 176.9 09/09/2016: 112 10/07/16: 109.6 11/18/16: 251.6 12/05/2016: 239.8   PATHOLOGY REPORT: Diagnosis 11/30/2015 Lymph node, needle/core biopsy, right breast/axilla - LYMPH NODE POSITIVE FOR METASTATIC POORLY DIFFERENTIATED CARCINOMA. - SEE COMMENT. Microscopic Comment Immunohistochemical stains are performed. The tumor is positive for cytokeratin AE1/AE3, cytokeratin 7 and E-cadherin. CDX-2 demonstrates weak positivity. Estrogen receptor demonstrates  focal very weak  to equivocal staining. Gross cystic disease fluid protein, cytokeratin 20, TTF-1, Melan-A and S100 are all negative. The findings are consistent with a poorly differentiated carcinoma. Given the staining pattern, an upper gastrointestinal and pancreatobiliary primary source should be ruled out. In addition, given the axillary location of the biopsy and focal weak to equivocal estrogen receptor staining, ruling out a poorly differentiated carcinoma of breast primary source is also prudent. Lastly, a gynecologic primary may be considered. Correlation with clinical and radiologic impression is essential. Dr. Vicente Males has seen this case in consultation with essential agreement of the above diagnosis and comment. The findings are called to the [facility] on 12/04/15. (RAH:gt, 12/04/15)  Diagnosis 12/27/2015 Breast, right, needle core biopsy, central - INVASIVE MAMMARY CARCINOMA. - MAMMARY CARCINOMA IN SITU. - LYMPH/VASCULAR INVASION IS IDENTIFIED. - SEE COMMENT. Microscopic Comment An E-cadherin stain will be performed to determine if the carcinoma is ductal or lobular in nature. The results of the stain will be reported in an addendum to follow. Although definitive grading of breast carcinoma is best done on excision, the features of the invasive tumor from the right central breast biopsy are compatible with a grade 3 breast carcinoma. Breast prognostic markers will be performed and reported in an addendum. Findings are called to the [facility] on 12/28/2015. Dr. Lyndon Code has seen this case in consultation with agreement. (RH:kh 12-28-15) ADDENDUM: An E-cadherin immunohistochemical stain is performed which is positive in both the invasive and in situ components confirming the ductal nature of both (i.e. invasive ductal carcinoma with ductal carcinoma in situ). (RAH:gt, 12/29/15) PROGNOSTIC INDICATORS Results: IMMUNOHISTOCHEMICAL AND MORPHOMETRIC  ANALYSIS PERFORMED MANUALLY Estrogen Receptor: 0%, NEGATIVE Progesterone Receptor: 0%, NEGATIVE Proliferation Marker Ki67: 70% Results: HER2 - NEGATIVE RATIO OF HER2/CEP17 SIGNALS 1.13 AVERAGE HER2 COPY NUMBER PER CELL 1.75  Diagnosis 02/07/2016 Lymph node, needle/core biopsy, left axillary - METASTATIC POORLY DIFFERENTIATED CARCINOMA. Results: IMMUNOHISTOCHEMICAL AND MORPHOMETRIC ANALYSIS PERFORMED MANUALLY Estrogen Receptor: 0%, NEGATIVE Progesterone Receptor: 0%, NEGATIVE Results: HER2 - NEGATIVE RATIO OF HER2/CEP17 SIGNALS 1.21 AVERAGE HER2 COPY NUMBER PER CELL 2.05  RADIOGRAPHIC STUDIES: I have personally reviewed the radiological images as listed and agreed with the findings in the report.  PET 11/27/16 IMPRESSION: 1. Interval progression of disease. 2. There is increased hypermetabolic activity within bilateral axillary and right pectoral lymph nodes which are similar in overall size to the prior study. 3. There is also extensive hypermetabolic activity throughout both breasts which is nodular and progressive, concerning for local extension or inflammatory breast cancer. 4. New multifocal hypermetabolic osseous metastatic disease. 5. No evidence of metastatic disease to the lungs or within the abdomen or pelvis.  PET 08/23/2016 IMPRESSION: 1. Response to therapy. Significant decrease in size and hypermetabolism of right breast mass and bilateral axillary nodal metastasis. Hypermetabolism within a right supraclavicular node has resolved. 2. New soft tissue fullness and hypermetabolism within the lateral left breast, suspicious for either metastatic disease or new primary. Mastitis could look similar. 3. No extrathoracic metastatic disease identified. 4.  Coronary artery atherosclerosis. Aortic atherosclerosis. 5. Diffuse marrow hypermetabolism is likely related to stimulation by chemotherapy. This decreases sensitivity for osseous metastasis.  PET  06/05/2016 IMPRESSION: 1. Progressive right breast mass, expanding in size and metabolic activity. Enlarging but bilateral axillary adenopathy likewise increased activity. A right supraclavicular lymph node is slightly smaller than previous but has a higher standard uptake value. Overall appearance compatible with progression but no further new metastatic spread compared to 01/03/2016. 2. Coronary, aortic  arch, and branch vessel atherosclerotic vascular disease. Aortoiliac atherosclerotic vascular disease. 3. Extensive edema along the anterior chest wall especially both breasts, right greater than left.  ASSESSMENT & PLAN:  71 y.o. female, with past medical history of HTN, GERD, sleep apnea, presented with bulky right axillary adenopathy.  1. 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 Unfortunately her cancer has metastasized to left axilla and cervical lymph nodes, which are distant metastasis.  -We previously reviewed the natural history of metastatic triple negative breast cancer, which is very aggressive, and her cancer is incurable at this stage  -Giving the metastatic disease, surgery up front is not indicated, I recommended systemic chemotherapy. --She is now on third line chemotherapy with carboplatin and gemcitabine, tolerated well, however she has developed a severe neutropenia and thrombocytopenia, and prolonged recovery, does reduced from cycle 2 -We previously discussed PET scan form 11/27/16 shows her cancer is progressing and has  metastasized to both breast, lymph nodes and in parts of her bone.  -Due to his cancer progression, I'll stop her carboplatin and gemcitabine -We previously discussed further treatment options. I recommend to change chemo to Eribulin for 2 weeks on and 1 week off along with and immunotherapy drug Nat Math, based on the [place]. It os off label use.   -I previously tested her BRCA1/2 to see if she is a candidate for PARPA inhibitor  -she previously started new Eribulin and Keytruda as 4th line treatment  -Due to the concern of his of progression, and likely direct skin invasion, which can cause open wound, I will refer her to radiation oncology to consider palliative radiation to right breast. - if she does radiation I may hold chemo and continue with Keytruda - due to unbearable pain in breast, I will give her small dosage oxycodone. I encouraged her to have Miralax or stool softener to help with constipation - she can reapply for a grant to help with medication co pay, her husband will try today - she has genetic testing on monday - f/u 7/19  2. ANEMIA in neoplastic disease -She previously developed normocytic mild anemia in the past few months. -Her anemia workup previously showed a normal folic acid and Y56 level, increased ferritin, decreased serum iron, TIBC and transferrin saturation. This is most consistent with anemia of chronic disease, secondary to her CKD and underlying malignancy.  -I previously encouraged her to continue oral iron supplement  3. Neutropenia and thrombocytopenia from chemotherapy -She had nadir plt 26K and ANC 0.4K after first cycle chemotherapy -Her blood counts are recovering -We previously reduced her chemotherapy dose the following cycle  4. CKD stage III -Her GFR was previously in 40-50's  -No lab signs of tumor lysis -Possibly related to her underlying hypertension and diuretics  -Her Cr has previously much improved since she stopped her  Lasix. -Cr increased to 1.4 previously, will give IVF  -Avoid nephrotoxins, such as NSAIDs and IV CT contrast  5. HTN, GERD, obesity, sleep apnea -She'll continue follow-up with her primary care physician  6. Right breast pain -secondary to breast cancer,  previously much improved after chemo  -continue tramadol as needed  -She now feels hardening in right breast but no pain.   7. Goal of care discussion  -We again discussed the incurable nature of her cancer, and the overall poor prognosis, especially if she does not have good response to chemotherapy or progress on chemo -The patient understands the goal of care is palliative. -I again recommend DNR/DNI, she will think about it    PLAN - adequate to continue Eribulin treatment today, C2D1 - consult radiologist for palliative radiation to the right breast, to palliate her cancer related pain and prevent open wound -prescribe oxycodone - f/u, lab and treatment 7/19, may hold chemotherapy if she will start palliative radiation soon.  I spent 30 minutes counseling the patient face to face. The total time spent in the appointment was 40 minutes and more than 50% was on counseling.  This document serves as a record of services personally performed by Truitt Merle, MD. It was created on her behalf by Brandt Loosen, a trained medical scribe. The creation of this record is based on the scribe's personal observations and the provider's statements to them. This document has been checked and approved by the attending provider.  I have reviewed the above documentation for accuracy and completeness and I agree with the above.    Truitt Merle, MD 12/26/2016

## 2016-12-26 ENCOUNTER — Ambulatory Visit (HOSPITAL_BASED_OUTPATIENT_CLINIC_OR_DEPARTMENT_OTHER): Payer: Medicare HMO | Admitting: Hematology

## 2016-12-26 ENCOUNTER — Other Ambulatory Visit: Payer: Self-pay

## 2016-12-26 ENCOUNTER — Ambulatory Visit: Payer: Medicare HMO

## 2016-12-26 ENCOUNTER — Other Ambulatory Visit (HOSPITAL_BASED_OUTPATIENT_CLINIC_OR_DEPARTMENT_OTHER): Payer: Medicare HMO

## 2016-12-26 ENCOUNTER — Telehealth: Payer: Self-pay | Admitting: Hematology

## 2016-12-26 ENCOUNTER — Ambulatory Visit (HOSPITAL_BASED_OUTPATIENT_CLINIC_OR_DEPARTMENT_OTHER): Payer: Medicare HMO

## 2016-12-26 VITALS — BP 166/65 | HR 88 | Temp 97.7°F | Resp 18 | Ht 64.0 in | Wt 171.4 lb

## 2016-12-26 DIAGNOSIS — C50911 Malignant neoplasm of unspecified site of right female breast: Secondary | ICD-10-CM | POA: Diagnosis not present

## 2016-12-26 DIAGNOSIS — C7951 Secondary malignant neoplasm of bone: Secondary | ICD-10-CM

## 2016-12-26 DIAGNOSIS — E669 Obesity, unspecified: Secondary | ICD-10-CM

## 2016-12-26 DIAGNOSIS — D701 Agranulocytosis secondary to cancer chemotherapy: Secondary | ICD-10-CM

## 2016-12-26 DIAGNOSIS — N183 Chronic kidney disease, stage 3 unspecified: Secondary | ICD-10-CM

## 2016-12-26 DIAGNOSIS — Z452 Encounter for adjustment and management of vascular access device: Secondary | ICD-10-CM

## 2016-12-26 DIAGNOSIS — Z171 Estrogen receptor negative status [ER-]: Secondary | ICD-10-CM

## 2016-12-26 DIAGNOSIS — D6959 Other secondary thrombocytopenia: Secondary | ICD-10-CM | POA: Diagnosis not present

## 2016-12-26 DIAGNOSIS — G4733 Obstructive sleep apnea (adult) (pediatric): Secondary | ICD-10-CM

## 2016-12-26 DIAGNOSIS — Z95828 Presence of other vascular implants and grafts: Secondary | ICD-10-CM

## 2016-12-26 DIAGNOSIS — D630 Anemia in neoplastic disease: Secondary | ICD-10-CM | POA: Diagnosis not present

## 2016-12-26 LAB — CBC WITH DIFFERENTIAL/PLATELET
BASO%: 0.3 % (ref 0.0–2.0)
Basophils Absolute: 0 10*3/uL (ref 0.0–0.1)
EOS%: 0.8 % (ref 0.0–7.0)
Eosinophils Absolute: 0 10*3/uL (ref 0.0–0.5)
HEMATOCRIT: 27.6 % — AB (ref 34.8–46.6)
HEMOGLOBIN: 8.5 g/dL — AB (ref 11.6–15.9)
LYMPH#: 0.3 10*3/uL — AB (ref 0.9–3.3)
LYMPH%: 7.9 % — ABNORMAL LOW (ref 14.0–49.7)
MCH: 26.6 pg (ref 25.1–34.0)
MCHC: 30.8 g/dL — AB (ref 31.5–36.0)
MCV: 86.5 fL (ref 79.5–101.0)
MONO#: 0.8 10*3/uL (ref 0.1–0.9)
MONO%: 20.3 % — ABNORMAL HIGH (ref 0.0–14.0)
NEUT#: 2.7 10*3/uL (ref 1.5–6.5)
NEUT%: 70.7 % (ref 38.4–76.8)
Platelets: 200 10*3/uL (ref 145–400)
RBC: 3.19 10*6/uL — ABNORMAL LOW (ref 3.70–5.45)
RDW: 16.5 % — AB (ref 11.2–14.5)
WBC: 3.8 10*3/uL — AB (ref 3.9–10.3)

## 2016-12-26 LAB — COMPREHENSIVE METABOLIC PANEL
ALBUMIN: 3.4 g/dL — AB (ref 3.5–5.0)
ALT: 11 U/L (ref 0–55)
AST: 15 U/L (ref 5–34)
Alkaline Phosphatase: 61 U/L (ref 40–150)
Anion Gap: 11 mEq/L (ref 3–11)
BUN: 29.5 mg/dL — ABNORMAL HIGH (ref 7.0–26.0)
CALCIUM: 9.2 mg/dL (ref 8.4–10.4)
CHLORIDE: 107 meq/L (ref 98–109)
CO2: 25 mEq/L (ref 22–29)
Creatinine: 1.4 mg/dL — ABNORMAL HIGH (ref 0.6–1.1)
EGFR: 44 mL/min/{1.73_m2} — ABNORMAL LOW (ref >90)
Glucose: 96 mg/dl (ref 70–140)
Potassium: 4.2 mEq/L (ref 3.5–5.1)
Sodium: 143 mEq/L (ref 136–145)
Total Bilirubin: 0.51 mg/dL (ref 0.20–1.20)
Total Protein: 7 g/dL (ref 6.4–8.3)

## 2016-12-26 MED ORDER — SODIUM CHLORIDE 0.9% FLUSH
10.0000 mL | INTRAVENOUS | Status: DC | PRN
  Administered 2016-12-26: 10 mL via INTRAVENOUS
  Filled 2016-12-26: qty 10

## 2016-12-26 MED ORDER — SODIUM CHLORIDE 0.9 % IV SOLN
1.3800 mg/m2 | Freq: Once | INTRAVENOUS | Status: AC
  Administered 2016-12-26: 2.6 mg via INTRAVENOUS
  Filled 2016-12-26: qty 5.2

## 2016-12-26 MED ORDER — HEPARIN SOD (PORK) LOCK FLUSH 100 UNIT/ML IV SOLN
500.0000 [IU] | Freq: Once | INTRAVENOUS | Status: AC | PRN
  Administered 2016-12-26: 500 [IU]
  Filled 2016-12-26: qty 5

## 2016-12-26 MED ORDER — PROCHLORPERAZINE MALEATE 10 MG PO TABS
10.0000 mg | ORAL_TABLET | Freq: Once | ORAL | Status: AC
  Administered 2016-12-26: 10 mg via ORAL

## 2016-12-26 MED ORDER — OXYCODONE HCL 5 MG PO TABS: 5.0000 mg | ORAL_TABLET | Freq: Four times a day (QID) | ORAL | 0 refills | Status: DC | PRN

## 2016-12-26 MED ORDER — SODIUM CHLORIDE 0.9 % IV SOLN
Freq: Once | INTRAVENOUS | Status: AC
  Administered 2016-12-26: 13:00:00 via INTRAVENOUS

## 2016-12-26 MED ORDER — SODIUM CHLORIDE 0.9% FLUSH
10.0000 mL | INTRAVENOUS | Status: DC | PRN
  Administered 2016-12-26: 10 mL
  Filled 2016-12-26: qty 10

## 2016-12-26 MED ORDER — PROCHLORPERAZINE MALEATE 10 MG PO TABS
ORAL_TABLET | ORAL | Status: AC
  Filled 2016-12-26: qty 1

## 2016-12-26 MED FILL — oxyCODONE HCL 5 MG TABS: 5 | 15 days supply | Qty: 60 | Fill #0

## 2016-12-26 NOTE — Patient Instructions (Signed)
Implanted Port Home Guide An implanted port is a type of central line that is placed under the skin. Central lines are used to provide IV access when treatment or nutrition needs to be given through a person's veins. Implanted ports are used for long-term IV access. An implanted port may be placed because:  You need IV medicine that would be irritating to the small veins in your hands or arms.  You need long-term IV medicines, such as antibiotics.  You need IV nutrition for a long period.  You need frequent blood draws for lab tests.  You need dialysis.  Implanted ports are usually placed in the chest area, but they can also be placed in the upper arm, the abdomen, or the leg. An implanted port has two main parts:  Reservoir. The reservoir is round and will appear as a small, raised area under your skin. The reservoir is the part where a needle is inserted to give medicines or draw blood.  Catheter. The catheter is a thin, flexible tube that extends from the reservoir. The catheter is placed into a large vein. Medicine that is inserted into the reservoir goes into the catheter and then into the vein.  How will I care for my incision site? Do not get the incision site wet. Bathe or shower as directed by your health care provider. How is my port accessed? Special steps must be taken to access the port:  Before the port is accessed, a numbing cream can be placed on the skin. This helps numb the skin over the port site.  Your health care provider uses a sterile technique to access the port. ? Your health care provider must put on a mask and sterile gloves. ? The skin over your port is cleaned carefully with an antiseptic and allowed to dry. ? The port is gently pinched between sterile gloves, and a needle is inserted into the port.  Only "non-coring" port needles should be used to access the port. Once the port is accessed, a blood return should be checked. This helps ensure that the port  is in the vein and is not clogged.  If your port needs to remain accessed for a constant infusion, a clear (transparent) bandage will be placed over the needle site. The bandage and needle will need to be changed every week, or as directed by your health care provider.  Keep the bandage covering the needle clean and dry. Do not get it wet. Follow your health care provider's instructions on how to take a shower or bath while the port is accessed.  If your port does not need to stay accessed, no bandage is needed over the port.  What is flushing? Flushing helps keep the port from getting clogged. Follow your health care provider's instructions on how and when to flush the port. Ports are usually flushed with saline solution or a medicine called heparin. The need for flushing will depend on how the port is used.  If the port is used for intermittent medicines or blood draws, the port will need to be flushed: ? After medicines have been given. ? After blood has been drawn. ? As part of routine maintenance.  If a constant infusion is running, the port may not need to be flushed.  How long will my port stay implanted? The port can stay in for as long as your health care provider thinks it is needed. When it is time for the port to come out, surgery will be   done to remove it. The procedure is similar to the one performed when the port was put in. When should I seek immediate medical care? When you have an implanted port, you should seek immediate medical care if:  You notice a bad smell coming from the incision site.  You have swelling, redness, or drainage at the incision site.  You have more swelling or pain at the port site or the surrounding area.  You have a fever that is not controlled with medicine.  This information is not intended to replace advice given to you by your health care provider. Make sure you discuss any questions you have with your health care provider. Document  Released: 06/03/2005 Document Revised: 11/09/2015 Document Reviewed: 02/08/2013 [employer] Interactive Patient Education  2017 [employer]  

## 2016-12-26 NOTE — Patient Instructions (Signed)
[place] Discharge Instructions for Patients Receiving Chemotherapy  Today you received the following chemotherapy agents:  Eribulin (halovan)  To help prevent nausea and vomiting after your treatment, we encourage you to take your nausea medication as prescribed.   If you develop nausea and vomiting that is not controlled by your nausea medication, call the clinic.   BELOW ARE SYMPTOMS THAT SHOULD BE REPORTED IMMEDIATELY:  *FEVER GREATER THAN 100.5 F  *CHILLS WITH OR WITHOUT FEVER  NAUSEA AND VOMITING THAT IS NOT CONTROLLED WITH YOUR NAUSEA MEDICATION  *UNUSUAL SHORTNESS OF BREATH  *UNUSUAL BRUISING OR BLEEDING  TENDERNESS IN MOUTH AND THROAT WITH OR WITHOUT PRESENCE OF ULCERS  *URINARY PROBLEMS  *BOWEL PROBLEMS  UNUSUAL RASH Items with * indicate a potential emergency and should be followed up as soon as possible.  Feel free to call the clinic you have any questions or concerns. The clinic phone number is [phone].  Please show the [facility] [place] at check-in to the Emergency Department and triage nurse.     Eribulin solution for injection What is this medicine? ERIBULIN (er e bu lin) is a chemotherapy drug. It is used to treat breast cancer and liposarcoma. This medicine may be used for other purposes; ask your health care provider or pharmacist if you have questions. COMMON BRAND NAME(S): Halaven What should I tell my health care provider before I take this medicine? They need to know if you have any of these conditions: -heart disease -history of irregular heartbeat -kidney disease -liver disease -low blood counts, like low white cell, platelet, or red cell counts -low levels of potassium or magnesium in the blood -an unusual or allergic reaction to eribulin, other medicines, foods, dyes, or preservatives -pregnant or trying to get pregnant -breast-feeding How should I use this medicine? This medicine is for infusion into a vein.  It is given by a health care professional in a hospital or clinic setting. Talk to your pediatrician regarding the use of this medicine in children. Special care may be needed. Overdosage: If you think you have taken too much of this medicine contact a poison control center or emergency room at once. NOTE: This medicine is only for you. Do not share this medicine with others. What if I miss a dose? It is important not to miss your dose. Call your doctor or health care professional if you are unable to keep an appointment. What may interact with this medicine? Do not take this medicine with any of the following medications: -amiodarone -astemizole -arsenic trioxide -bepridil -bretylium -chloroquine -chlorpromazine -cisapride -clarithromycin -dextromethorphan, quinidine -disopyramide -dofetilide -droperidol -dronedarone -erythromycin -grepafloxacin -halofantrine -haloperidol -ibutilide -levomethadyl -mesoridazine -methadone -pentamidine -procainamide -quinidine -pimozide -posaconazole -probucol -propafenone -saquinavir -sotalol -sparfloxacin -terfenadine -thioridazine -troleandomycin -ziprasidone This list may not describe all possible interactions. Give your health care provider a list of all the medicines, herbs, non-prescription drugs, or dietary supplements you use. Also tell them if you smoke, drink alcohol, or use illegal drugs. Some items may interact with your medicine. What should I watch for while using this medicine? This drug may make you feel generally unwell. This is not uncommon, as chemotherapy can affect healthy cells as well as cancer cells. Report any side effects. Continue your course of treatment even though you feel ill unless your doctor tells you to stop. Call your doctor or health care professional for advice if you get a fever, chills or sore throat, or other symptoms of a cold or flu. Do not treat  yourself. This drug decreases your body's  ability to fight infections. Try to avoid being around people who are sick. This medicine may increase your risk to bruise or bleed. Call your doctor or health care professional if you notice any unusual bleeding. You may need blood work done while you are taking this medicine. Do not become pregnant while taking this medicine or for 2 weeks after stopping it. Women should inform their doctor if they wish to become pregnant or think they might be pregnant. Men should not father a child while taking this medicine and for 3.5 months after stopping it. There is a potential for serious side effects to an unborn child. Talk to your health care professional or pharmacist for more information. Do not breast-feed an infant while taking this medicine or for 2 weeks after stopping it. What side effects may I notice from receiving this medicine? Side effects that you should report to your doctor or health care professional as soon as possible: -allergic reactions like skin rash, itching or hives, swelling of the face, lips, or tongue -low blood counts - this medicine may decrease the number of white blood cells, red blood cells and platelets. You may be at increased risk for infections and bleeding. -signs of infection - fever or chills, cough, sore throat, pain or difficulty passing urine -signs of decreased platelets or bleeding - bruising, pinpoint red spots on the skin, black, tarry stools, blood in the urine -signs of decreased red blood cells - unusually weak or tired, fainting spells, lightheadedness -pain, tingling, numbness in the hands or feet Side effects that usually do not require medical attention (report to your doctor or health care professional if they continue or are bothersome): -constipation -hair loss -headache -loss of appetite -muscle or joint pain -nausea, vomiting -stomach pain This list may not describe all possible side effects. Call your doctor for medical advice about side  effects. You may report side effects to FDA at 1-800-FDA-1088. Where should I keep my medicine? This drug is given in a hospital or clinic and will not be stored at home. NOTE: This sheet is a summary. It may not cover all possible information. If you have questions about this medicine, talk to your doctor, pharmacist, or health care provider.  2018 Elsevier/Gold Standard (2015-07-06 10:11:26)

## 2016-12-26 NOTE — Telephone Encounter (Signed)
NO LOS 7/12

## 2016-12-27 ENCOUNTER — Encounter: Payer: Self-pay | Admitting: Radiation Oncology

## 2016-12-27 LAB — CANCER ANTIGEN 27.29: CA 27.29: 291.8 U/mL — ABNORMAL HIGH (ref 0.0–38.6)

## 2016-12-27 NOTE — Progress Notes (Signed)
Location of Breast Cancer: 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 - metastasized to both breasts, lymph nodes and in parts of her bone.  Histology per Pathology Report:   02/07/16 Diagnosis Lymph node, needle/core biopsy, left axillary - METASTATIC POORLY DIFFERENTIATED CARCINOMA.  12/27/15 Diagnosis Breast, right, needle core biopsy, central - INVASIVE MAMMARY CARCINOMA. - MAMMARY CARCINOMA IN SITU. - LYMPH/VASCULAR INVASION IS IDENTIFIED. - SEE COMMENT.  11/30/15 Diagnosis Lymph node, needle/core biopsy, right breast/axilla - LYMPH NODE POSITIVE FOR METASTATIC POORLY DIFFERENTIATED CARCINOMA. - SEE COMMENT.  Receptor Status: ER(0%), PR (0%), Her2-neu (negative), Ki-(70%)  Did patient present with symptoms (if so, please note symptoms) or was this found on screening mammography?: She noticed a lump at right axilla in 08/2015  Past/Anticipated interventions by surgeon, if any: no   Past/Anticipated interventions by medical oncology, if any: Eribulin infusion 2 weeks on and 1 week off, and Keytruda every 3 weeks, starting 6/21 (Keytruda beginning 12/12/2016).  Last had chemotherapy on Thursday..  Lymphedema issues, if any:  no}   Pain issues, if any:  Has pain 10/10 in her bil breasts.  She was given oxycodone 5 mg and she said it made her jittery.  She it currently taking tramadol q 6 hours now.  SAFETY ISSUES:  Prior radiation? no  Pacemaker/ICD? no  Possible current pregnancy?no  Is the patient on methotrexate? no  Current Complaints / other details:  Patient is here with her daughter and granddaughter.  BP 129/77 (BP Location: Left Arm)   Pulse 94   Temp 98.6 F (37 C) (Oral)   Ht 5' 4"  (1.626 m)   Wt 176 lb 9.6 oz (80.1 kg)   SpO2 97%   BMI 30.31 kg/m   Wt Readings from Last 3 Encounters:  12/30/16 176 lb 9.6 oz (80.1 kg)  12/26/16 171 lb 6.4 oz (77.7 kg)  12/11/16 179 lb 3.2 oz (81.3 kg)      Awanda Mink Craige Cotta, RN 12/27/2016,12:37  PM

## 2016-12-29 ENCOUNTER — Encounter: Payer: Self-pay | Admitting: Hematology

## 2016-12-30 ENCOUNTER — Encounter: Payer: Self-pay | Admitting: Genetic Counselor

## 2016-12-30 ENCOUNTER — Ambulatory Visit
Admission: RE | Admit: 2016-12-30 | Discharge: 2016-12-30 | Disposition: A | Discharge status: Home / Self Care | Payer: Medicare HMO | Source: Ambulatory Visit | Attending: Radiation Oncology | Admitting: Radiation Oncology

## 2016-12-30 ENCOUNTER — Other Ambulatory Visit: Payer: Medicare HMO

## 2016-12-30 ENCOUNTER — Ambulatory Visit (HOSPITAL_BASED_OUTPATIENT_CLINIC_OR_DEPARTMENT_OTHER): Payer: Medicare HMO | Admitting: Genetic Counselor

## 2016-12-30 ENCOUNTER — Encounter: Payer: Self-pay | Admitting: Radiation Oncology

## 2016-12-30 DIAGNOSIS — C50911 Malignant neoplasm of unspecified site of right female breast: Secondary | ICD-10-CM | POA: Diagnosis not present

## 2016-12-30 DIAGNOSIS — C7981 Secondary malignant neoplasm of breast: Secondary | ICD-10-CM | POA: Diagnosis not present

## 2016-12-30 DIAGNOSIS — Z803 Family history of malignant neoplasm of breast: Secondary | ICD-10-CM | POA: Diagnosis not present

## 2016-12-30 DIAGNOSIS — K219 Gastro-esophageal reflux disease without esophagitis: Secondary | ICD-10-CM | POA: Insufficient documentation

## 2016-12-30 DIAGNOSIS — G4730 Sleep apnea, unspecified: Secondary | ICD-10-CM | POA: Diagnosis not present

## 2016-12-30 DIAGNOSIS — Z8673 Personal history of transient ischemic attack (TIA), and cerebral infarction without residual deficits: Secondary | ICD-10-CM | POA: Insufficient documentation

## 2016-12-30 DIAGNOSIS — Z79899 Other long term (current) drug therapy: Secondary | ICD-10-CM | POA: Insufficient documentation

## 2016-12-30 DIAGNOSIS — C773 Secondary and unspecified malignant neoplasm of axilla and upper limb lymph nodes: Secondary | ICD-10-CM | POA: Diagnosis not present

## 2016-12-30 DIAGNOSIS — Z510 Encounter for antineoplastic radiation therapy: Secondary | ICD-10-CM | POA: Diagnosis not present

## 2016-12-30 DIAGNOSIS — Z7183 Encounter for nonprocreative genetic counseling: Secondary | ICD-10-CM

## 2016-12-30 DIAGNOSIS — Z7982 Long term (current) use of aspirin: Secondary | ICD-10-CM | POA: Diagnosis not present

## 2016-12-30 DIAGNOSIS — Z9221 Personal history of antineoplastic chemotherapy: Secondary | ICD-10-CM | POA: Diagnosis not present

## 2016-12-30 DIAGNOSIS — Z87891 Personal history of nicotine dependence: Secondary | ICD-10-CM | POA: Insufficient documentation

## 2016-12-30 DIAGNOSIS — M1990 Unspecified osteoarthritis, unspecified site: Secondary | ICD-10-CM | POA: Diagnosis not present

## 2016-12-30 DIAGNOSIS — N644 Mastodynia: Secondary | ICD-10-CM | POA: Diagnosis not present

## 2016-12-30 DIAGNOSIS — I10 Essential (primary) hypertension: Secondary | ICD-10-CM | POA: Insufficient documentation

## 2016-12-30 DIAGNOSIS — Z171 Estrogen receptor negative status [ER-]: Secondary | ICD-10-CM | POA: Insufficient documentation

## 2016-12-30 HISTORY — DX: Malignant neoplasm of unspecified site of unspecified female breast: C50.919

## 2016-12-30 MED ORDER — TRAMADOL HCL 50 MG PO TABS: 50.0000 mg | ORAL_TABLET | Freq: Three times a day (TID) | ORAL | 1 refills | Status: AC | PRN

## 2016-12-30 MED FILL — traMADol HCL 50 MG TABS: 50 | 20 days supply | Qty: 60 | Fill #0

## 2016-12-30 NOTE — Progress Notes (Signed)
Radiation Oncology         [phone] ________________________________  Initial Outpatient Consultation  Name: [name] MRN: [id number]  Date: 12/30/2016  DOB: 02-20-1946  EN:IDPOEU, Peri Jefferson, MD  Truitt Merle, MD   REFERRING PHYSICIAN: Truitt Merle, MD  DIAGNOSIS: 71 y.o. 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  HISTORY OF PRESENT ILLNESS::[name] is a 71 y.o. female who was initially diagnosed with breast cancer on 11/30/2015.  She reported a self-palpated mass at the right axilla in March 2017 and proceeded to undergo a diagnostic mammogram and ultrasound of the right breast.  Both MM and [place] on 11/14/2015 revealed a suspicious right axillary mass and numerous enlarged right axillary lymph nodes, the largest measuring 5.3 x 4.2 x 2.6 cm.  Needle/core biopsy of the right breast/axillary lymph node on 11/30/2015 revealed positive for metatstatic poorly differentiated carcinoma.  MRI guided biopsy of the right breast on 12/27/2015 showed invasive mammary carcinoma and mammary carcinoma in situ.  Ultrasound guided core needle biopsy of a left axillary node on 02/07/2016 revealed metastatic poorly differentiated carcinoma.  Receptor status: (ER 0%, PR 0%, HER2 negative, Ki67 40%).  The patient underwent chemotherapy with Dr. [name] from 01/10/2016 to 05/28/2016, 06/21/2016 to 08/12/2016, and 09/09/2016 to present.  In response to chemotherapy, a PET scan on 08/23/2016 showed a significant decrease in size and hypermetabolism of right breast mass and bilateral axillary nodal metastasis, but revealed a new soft tissue fullness and hypermetabolism within the lateral left breast, suspicious for metastatic disease. Repeat PET scan on 11/27/2016 showed new multifocal hypermetabolic osseous metastatic disease.    Dr. [name] has referred the patient today to discuss radiation therapy options.  She is accompanied by her daughter and  granddaughter.  Oncology History   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 [facility])   Staging form: Breast, AJCC 7th Edition   - Clinical stage from 11/30/2015: Stage IV (T4d, N3c, M1) - Signed by Truitt Merle, MD on 01/05/2016      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 [facility])   11/30/2015 Initial Diagnosis    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 [facility])      11/30/2015 Initial Biopsy    Right axilla lymph node biopsy showed metastatic poorly differentiated adenocarcinoma, IHC positive for CK AE1/AE3, CK7, E-cadherin, CDX-2, and focally very weak to equivocal staining for ER      12/21/2015 Imaging    Bilateral breast MRI showed extensive non-mass enhancement involving the entire right breast, right axillary, supraclavicular, and internal mammary adenopathy, new left axillary adenopathy      12/27/2015 Mammogram    Diagnostic mammogram and ultrasound of right breast and axilla showed diffuse skin thickening and increased density, no focal mass, numerous enlarged right axillary lymph node, the largest measuring 5.3cm.       12/27/2015 Initial Biopsy    Right breast core needle biopsy showed invasive ductal carcinoma, and DCIS, lymphovascular invasion identified      12/27/2015 Receptors her2    ER negative, PR negative, Ki-67 70%, HER-2 negative      01/03/2016 Imaging    PET scan showed diffuse asymmetric right breast hypermetabolic soft tissue density and skin thickening, hypermetabolic metastatic lymphadenopathy in bilateral axillary and subpectoral regions, right supraclavicular region and the right cervical level 5      01/10/2016 - 05/28/2016 Chemotherapy    Weekly Taxol 80 mg/m, second cycle  was postponed to 8/22 due to pt's non-compliance, stopped due to disease progression. She developed mild peripheral neuropathy in early Dec 2017.      02/07/2016 Pathology Results    Left axillary lymph node biopsy showed metastatic  poorly differentiated carcinoma, triple negative.      04/24/2016 Imaging    CT CHEST, ABDOMEN AND PELVIS WITH CONTRAST IMPRESSION: 1. Bulky right axillary and mild right retropectoral and right supraclavicular lymphadenopathy is mildly decreased since 01/03/2016 PET-CT. 2. Left axillary lymphadenopathy is mildly increased. 3. Diffuse asymmetric thickening of the skin and fibroglandular soft tissues of the right breast is not appreciably changed. 4. No definite additional sites of metastatic disease in the chest, abdomen or pelvis. 5. Subcentimeter right liver lobe lesion is too small to characterize and is probably stable. 6. Additional findings include aortic atherosclerosis, 1 vessel coronary atherosclerosis, tiny hiatal hernia and myomatous uterus.      06/05/2016 Imaging    PET scan showed progressive right breast mass, extending in size and metabolic activity. Enlarging bilateral axillary adenopathy likewise in increased activity. A right supraclavicular lymph node is slightly smaller than before, no other new metastasis.      06/21/2016 - 08/12/2016 Chemotherapy    Adriamycin 60 mg/m, and Cytoxan 600 mg/m, every 2-3 weeks, for a total of 4 cycles.       08/23/2016 PET scan    1. Response to therapy. Significant decrease in size and hypermetabolism of right breast mass and bilateral axillary nodal metastasis. Hypermetabolism within a right supraclavicular node has resolved. 2. New soft tissue fullness and hypermetabolism within the lateral left breast, suspicious for either metastatic disease or new primary. Mastitis could look similar. 3. No extrathoracic metastatic disease identified. 4.  Coronary artery atherosclerosis. Aortic atherosclerosis. 5. Diffuse marrow hypermetabolism is likely related to stimulation by chemotherapy. This decreases sensitivity for osseous metastasis.      09/09/2016 -  Chemotherapy    Carboplatin AUC 2, and gemcitabine 1000 mg/m, on day 1 and 8  every 21 days, dose reduced from cycle 2 due to significant neutropenia and thrombocytopenia. Neulasta added on day 9 from cycle 2 and granix added day 2 cycle 2  Due to cancer progression change treatment to Eribulin infusion 2 weeks on and 1 week starting 6/21       11/27/2016 PET scan    PET 11/27/16 IMPRESSION: 1. Interval progression of disease. 2. There is increased hypermetabolic activity within bilateral axillary and right pectoral lymph nodes which are similar in overall size to the prior study. 3. There is also extensive hypermetabolic activity throughout both breasts which is nodular and progressive, concerning for local extension or inflammatory breast cancer. 4. New multifocal hypermetabolic osseous metastatic disease. 5. No evidence of metastatic disease to the lungs or within the abdomen or pelvis.        PREVIOUS RADIATION THERAPY: No  PAST MEDICAL HISTORY:  has a past medical history of Arthritis; Breast cancer ([place]); Family history of breast cancer; GERD (gastroesophageal reflux disease); Hypertension; Shortness of breath; Sleep apnea; and Stroke ([place]).    PAST SURGICAL HISTORY: Past Surgical History:  Procedure Laterality Date  . IR CV LINE INJECTION  11/28/2016  . IR GENERIC HISTORICAL  06/13/2016   IR US GUIDE VASC ACCESS LEFT 06/13/2016 Corrie Mckusick, DO [facility]INTER[facility]  . IR GENERIC HISTORICAL  06/13/2016   IR FLUORO GUIDE PORT INSERTION LEFT 06/13/2016 Corrie Mckusick, DO [facility]  . NO PAST SURGERIES      FAMILY HISTORY: family  history includes Breast cancer in her cousin; Cancer in her sister; Cancer (age of onset: 66) in her father; Heart disease in her mother.  SOCIAL HISTORY:  reports that she quit smoking about 43 years ago. Her smoking use included Cigarettes. She started smoking about 48 years ago. She has a 0.50 pack-year smoking history. She has never used smokeless tobacco. She reports that she does not drink alcohol or use drugs.  ALLERGIES:  Patient has no known allergies.  MEDICATIONS:  Current Outpatient Prescriptions  Medication Sig Dispense Refill  . amLODipine (NORVASC) 10 MG tablet Take 1 tablet (10 mg total) by mouth daily. 90 tablet 3  . aspirin EC 81 MG tablet Take 1 tablet (81 mg total) by mouth daily. 90 tablet 3  . atorvastatin (LIPITOR) 20 MG tablet TAKE 1 TABLET EVERY DAY (Patient taking differently: Take 20 mg by mouth daily) 90 tablet 3  . lidocaine-prilocaine (EMLA) cream Apply to port @ 1 hour prior to treatment. 30 g 1  . metoprolol tartrate (LOPRESSOR) 25 MG tablet TAKE 1 TABLET EVERY DAY 90 tablet 3  . ondansetron (ZOFRAN) 8 MG tablet Take by mouth every 8 (eight) hours as needed for nausea or vomiting.    . prochlorperazine (COMPAZINE) 10 MG tablet Take 10 mg by mouth every 6 (six) hours as needed for nausea or vomiting.    . traMADol (ULTRAM) 50 MG tablet Take 1 tablet (50 mg total) by mouth every 8 (eight) hours as needed. 60 tablet 1  . oxyCODONE (OXY IR/ROXICODONE) 5 MG immediate release tablet Take 1 tablet (5 mg total) by mouth every 6 (six) hours as needed for severe pain. (Patient not taking: Reported on 12/30/2016) 60 tablet 0   No current facility-administered medications for this encounter.    Facility-Administered Medications Ordered in Other Encounters  Medication Dose Route Frequency Provider Last Rate Last Dose  . sodium chloride flush (NS) 0.9 % injection 10 mL  10 mL Intravenous PRN Truitt Merle, MD   10 mL at 08/12/16 1754  . sodium chloride flush (NS) 0.9 % injection 10 mL  10 mL Intravenous PRN Truitt Merle, MD   10 mL at 11/18/16 1516    REVIEW OF SYSTEMS:  REVIEW OF SYSTEMS: A 10+ POINT REVIEW OF SYSTEMS WAS OBTAINED including neurology, dermatology, psychiatry, cardiac, respiratory, lymph, extremities, GI, GU, musculoskeletal, constitutional, reproductive, HEENT. All pertinent positives are noted in the HPI. All others are negative. The patient reports no swelling in the arms or limited ROM.  She reports breast pain that she describes as "heavy," and is currently taking Tramadol for the pain. She reports minor headaches but denies back pain.   PHYSICAL EXAM:  height is '5\' 4"'$  (1.626 m) and weight is 176 lb 9.6 oz (80.1 kg). Her oral temperature is 98.6 F (37 C). Her blood pressure is 129/77 and her pulse is 94. Her oxygen saturation is 97%.   General: Alert and oriented, in no acute distress HEENT: Head is normocephalic. Extraocular movements are intact. Oropharynx is clear. Neck: Neck is supple, no palpable cervical or supraclavicular lymphadenopathy. Heart: Regular in rate and rhythm with no murmurs, rubs, or gallops. Chest: Clear to auscultation bilaterally, with no rhonchi, wheezes, or rales. Abdomen: Soft, nontender, nondistended, with no rigidity or guarding. Extremities: No cyanosis or edema. Lymphatics: see Neck Exam Skin: No concerning lesions. Musculoskeletal: symmetric strength and muscle tone throughout. Neurologic: Cranial nerves II through XII are grossly intact. No obvious focalities. Speech is fluent. Coordination is intact. Psychiatric: Judgment  and insight are intact. Affect is appropriate. Breast: Right breast is replaced by very hard tumor.  There is a 2.5 x 3 cm area that is close to eroding through the skin in the inferior right breast. Left breast also has diffuse infiltration throughout much of the breast. There are peau d'orange changes throughout both breasts. The patient has approximately 2.5 x 3 right axillary lymph node.  ECOG = 2 2 - Symptomatic, <50% in bed during the day (Ambulatory and capable of all self care but unable to carry out any work activities. Up and about more than 50% of waking hours)  LABORATORY DATA:  Lab Results  Component Value Date   WBC 3.8 (L) 12/26/2016   HGB 8.5 (L) 12/26/2016   HCT 27.6 (L) 12/26/2016   MCV 86.5 12/26/2016   PLT 200 12/26/2016   NEUTROABS 2.7 12/26/2016   Lab Results  Component Value Date   NA 143  12/26/2016   K 4.2 12/26/2016   CL 106 06/13/2016   CO2 25 12/26/2016   GLUCOSE 96 12/26/2016   CREATININE 1.4 (H) 12/26/2016   CALCIUM 9.2 12/26/2016      RADIOGRAPHY: No results found.    IMPRESSION: [name] is a 71 y.o. 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  She is a good candidate for palliative radiation therapy.  I discussed the pathology findings and reviewed the nature of her disease. I advised external radiotherapy to both breasts to help with breast pain, which would be administered over 4-5 weeks to both breast areas.. We discussed the risks, benefits, short, and long term effects of radiotherapy, and the patient is interested in proceeding. I discussed the delivery and logistics of radiotherapy. The patient signed the radiation consent form and a copy was retained for our records.  PLAN: 1) CT simulation will be scheduled for this week.  2) Begin radiation therapy soon.             ------------------------------------------------  Blair Promise, PhD, MD  This document serves as a record of services personally performed by Gery Pray, MD. It was created on his behalf by Rae Lips, a trained medical scribe. The creation of this record is based on the scribe's personal observations and the provider's statements to them. This document has been checked and approved by the attending provider.

## 2016-12-30 NOTE — Progress Notes (Signed)
REFERRING PROVIDER: Truitt Merle, MD [address] [place], [place] [address]  PRIMARY PROVIDER:  [name], MD  PRIMARY REASON FOR VISIT:  1. 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 [facility])   2. Family history of breast cancer      HISTORY OF PRESENT ILLNESS:   [name], a 71 y.o. female, was seen for a [facility] cancer genetics consultation at the request of Dr. [name] due to a personal and family history of cancer.  [name] presents to clinic today to discuss the possibility of a hereditary predisposition to cancer, genetic testing, and to further clarify her future cancer risks, as well as potential cancer risks for family members.   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  This is being treated with chemotherapy.      CANCER HISTORY:  Oncology History   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 [facility])   Staging form: Breast, AJCC 7th Edition   - Clinical stage from 11/30/2015: Stage IV (T4d, N3c, M1) - Signed by Truitt Merle, MD on 01/05/2016      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 [facility])   11/30/2015 Initial Diagnosis    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 [facility])      11/30/2015 Initial Biopsy    Right axilla lymph node biopsy showed metastatic poorly differentiated adenocarcinoma, IHC positive for CK AE1/AE3, CK7, E-cadherin, CDX-2, and focally very weak to equivocal staining for ER      12/21/2015 Imaging    Bilateral breast MRI showed extensive non-mass enhancement involving the entire right breast, right axillary, supraclavicular, and internal mammary adenopathy, new left axillary adenopathy      12/27/2015 Mammogram    Diagnostic mammogram and ultrasound of right breast and axilla showed diffuse skin thickening and increased density, no focal mass, numerous enlarged  right axillary lymph node, the largest measuring 5.3cm.       12/27/2015 Initial Biopsy    Right breast core needle biopsy showed invasive ductal carcinoma, and DCIS, lymphovascular invasion identified      12/27/2015 Receptors her2    ER negative, PR negative, Ki-67 70%, HER-2 negative      01/03/2016 Imaging    PET scan showed diffuse asymmetric right breast hypermetabolic soft tissue density and skin thickening, hypermetabolic metastatic lymphadenopathy in bilateral axillary and subpectoral regions, right supraclavicular region and the right cervical level 5      01/10/2016 - 05/28/2016 Chemotherapy    Weekly Taxol 80 mg/m, second cycle was postponed to 8/22 due to pt's non-compliance, stopped due to disease progression. She developed mild peripheral neuropathy in early Dec 2017.      02/07/2016 Pathology Results    Left axillary lymph node biopsy showed metastatic poorly differentiated carcinoma, triple negative.      04/24/2016 Imaging    CT CHEST, ABDOMEN AND PELVIS WITH CONTRAST IMPRESSION: 1. Bulky right axillary and mild right retropectoral and right supraclavicular lymphadenopathy is mildly decreased since 01/03/2016 PET-CT. 2. Left axillary lymphadenopathy is mildly increased. 3. Diffuse asymmetric thickening of the skin and fibroglandular soft tissues of the right breast is not appreciably changed. 4. No definite additional sites of metastatic disease in the chest, abdomen or pelvis. 5. Subcentimeter right liver lobe lesion is too small to characterize and is probably stable. 6. Additional findings include aortic  atherosclerosis, 1 vessel coronary atherosclerosis, tiny hiatal hernia and myomatous uterus.      06/05/2016 Imaging    PET scan showed progressive right breast mass, extending in size and metabolic activity. Enlarging bilateral axillary adenopathy likewise in increased activity. A right supraclavicular lymph node is slightly smaller than before, no other new  metastasis.      06/21/2016 - 08/12/2016 Chemotherapy    Adriamycin 60 mg/m, and Cytoxan 600 mg/m, every 2-3 weeks, for a total of 4 cycles.       08/23/2016 PET scan    1. Response to therapy. Significant decrease in size and hypermetabolism of right breast mass and bilateral axillary nodal metastasis. Hypermetabolism within a right supraclavicular node has resolved. 2. New soft tissue fullness and hypermetabolism within the lateral left breast, suspicious for either metastatic disease or new primary. Mastitis could look similar. 3. No extrathoracic metastatic disease identified. 4.  Coronary artery atherosclerosis. Aortic atherosclerosis. 5. Diffuse marrow hypermetabolism is likely related to stimulation by chemotherapy. This decreases sensitivity for osseous metastasis.      09/09/2016 -  Chemotherapy    Carboplatin AUC 2, and gemcitabine 1000 mg/m, on day 1 and 8 every 21 days, dose reduced from cycle 2 due to significant neutropenia and thrombocytopenia. Neulasta added on day 9 from cycle 2 and granix added day 2 cycle 2  Due to cancer progression change treatment to Eribulin infusion 2 weeks on and 1 week starting 6/21       11/27/2016 PET scan    PET 11/27/16 IMPRESSION: 1. Interval progression of disease. 2. There is increased hypermetabolic activity within bilateral axillary and right pectoral lymph nodes which are similar in overall size to the prior study. 3. There is also extensive hypermetabolic activity throughout both breasts which is nodular and progressive, concerning for local extension or inflammatory breast cancer. 4. New multifocal hypermetabolic osseous metastatic disease. 5. No evidence of metastatic disease to the lungs or within the abdomen or pelvis.         HORMONAL RISK FACTORS:  Menarche was at age 54.  First live birth at age 69.  OCP use for approximately <5 years.  Ovaries intact: yes.  Hysterectomy: no.  Menopausal status:  postmenopausal.  HRT use: 0 years. Colonoscopy: yes; some polyps. Mammogram within the last year: yes. Number of breast biopsies: 1. Up to date with pelvic exams:  no. Any excessive radiation exposure in the past:  no  Past Medical History:  Diagnosis Date  . Arthritis   . Breast cancer ([place])    right  . Family history of breast cancer   . GERD (gastroesophageal reflux disease)   . Hypertension   . Shortness of breath   . Sleep apnea   . Stroke [facility])     Past Surgical History:  Procedure Laterality Date  . IR CV LINE INJECTION  11/28/2016  . IR GENERIC HISTORICAL  06/13/2016   IR US GUIDE VASC ACCESS LEFT 06/13/2016 Corrie Mckusick, DO [facility]-INTERV RAD  . IR GENERIC HISTORICAL  06/13/2016   IR FLUORO GUIDE PORT INSERTION LEFT 06/13/2016 Corrie Mckusick, DO [facility]INTER[facility]  . NO PAST SURGERIES      Social History   Social History  . Marital status: Married    Spouse name: N/A  . Number of children: 4  . Years of education: 8 TH   Social History Main Topics  . Smoking status: Former Smoker    Packs/day: 0.50    Years: 1.00    Types: Cigarettes  Start date: 06/17/1968    Quit date: 06/17/1973  . Smokeless tobacco: Never Used     Comment: Quit in 61s  . Alcohol use No     Comment: former drinker for approximately 10 years  . Drug use: No  . Sexual activity: Not Asked   Other Topics Concern  . None   Social History Narrative   Patient is married with 3 children still living and 1 deceased son.   Patient is right handed.   Patient has 8 th grade education.   Patient drinks 2-3 servings daily.     FAMILY HISTORY:  We obtained a detailed, 4-generation family history.  Significant diagnoses are listed below: Family History  Problem Relation Age of Onset  . Heart disease Mother   . Cancer Father 35       throat cancer  . Breast cancer Cousin        paternal first cousin dx in her late 39s-early 60s  . Cancer Sister        "cancer down below"    The patient has  three daughters and one son.  Her son was killed by a gun shot wound at 55.  One daughter reportedly had some cancer cells found at the birth of one of her children that was never followed up.  The patient has two full sisters and five full brothers.  None had cancer, but one brother has two daughters who had either ovarian cancer, or possibly ovarian cysts.  She also has several paternal half siblings.  One half sister had cancer "down below".  The patient's father had throat cancer and her mother died of a stroke.  There is no reported cancer on the maternal side of the family.  The patient's father had several siblings, none known to have cancer.  One paternal cousin had breast cancer in her 49's-60's.  The patient's paternal grandmother had a heart attack in her 23's and died.    Ms. Sundquist is unaware of previous family history of genetic testing for hereditary cancer risks. Patient's maternal ancestors are of African American descent, and paternal ancestors are of African American descent. There is no reported Ashkenazi Jewish ancestry. There is no known consanguinity.  GENETIC COUNSELING ASSESSMENT: SHEMECA LUKASIK is a 71 y.o. female with a personal history of metastatic breast cancer and family history of cancer which is somewhat suggestive of a hereditary cancer syndrome and predisposition to cancer. We, therefore, discussed and recommended the following at today's visit.   DISCUSSION: We discussed that about 5-10% of breast cancer is hereditary with most cases due to BRCA mutations.  There are other genes associated with breast cancer including ATM, CHEK2 and PALB2.  In the case of metastatic breast cancer we are performing testing to determine the use of PARPi, a specific type of treatment.  We reviewed the characteristics, features and inheritance patterns of hereditary cancer syndromes. We also discussed genetic testing, including the appropriate family members to test, the process of  testing, insurance coverage and turn-around-time for results. We discussed the implications of a negative, positive and/or variant of uncertain significant result. We recommended Ms. Appelt pursue genetic testing for the Common Hereditary cancer gene panel. The Hereditary Gene Panel offered by [facility] includes sequencing and/or deletion duplication testing of the following 46 genes: APC, ATM, AXIN2, BARD1, BMPR1A, BRCA1, BRCA2, BRIP1, CDH1, CDKN2A (p14ARF), CDKN2A (p16INK4a), CHEK2, CTNNA1, DICER1, EPCAM (Deletion/duplication testing only), GREM1 (promoter region deletion/duplication testing only), KIT, MEN1, MLH1, MSH2, MSH3, MSH6,  MUTYH, NBN, NF1, NHTL1, PALB2, PDGFRA, PMS2, POLD1, POLE, PTEN, RAD50, RAD51C, RAD51D, SDHB, SDHC, SDHD, SMAD4, SMARCA4. STK11, TP53, TSC1, TSC2, and VHL.  The following genes were evaluated for sequence changes only: SDHA and HOXB13 c.251G>A variant only.  Based on Ms. Marcantel's personal and family history of cancer, she meets medical criteria for genetic testing. Despite that she meets criteria, she may still have an out of pocket cost. We discussed that if her out of pocket cost for testing is over $100, the laboratory will call and confirm whether she wants to proceed with testing.  If the out of pocket cost of testing is less than $100 she will be billed by the genetic testing laboratory.   PLAN: After considering the risks, benefits, and limitations, Ms. Mullany  provided informed consent to pursue genetic testing and the blood sample was sent to [facility] for analysis of the Common Hereditary cancer panel. Results should be available within approximately 2-3 weeks' time, at which point they will be disclosed by telephone to Ms. Fiddler, as will any additional recommendations warranted by these results. Ms. Horseman will receive a summary of her genetic counseling visit and a copy of her results once available. This information will also be available in Epic. We  encouraged Ms. Clingerman to remain in contact with cancer genetics annually so that we can continuously update the family history and inform her of any changes in cancer genetics and testing that may be of benefit for her family. Ms. Jenkinson questions were answered to her satisfaction today. Our contact information was provided should additional questions or concerns arise.  Lastly, we encouraged Ms. Hebb to remain in contact with cancer genetics annually so that we can continuously update the family history and inform her of any changes in cancer genetics and testing that may be of benefit for this family.   Ms.  Shvartsman questions were answered to her satisfaction today. Our contact information was provided should additional questions or concerns arise. Thank you for the referral and allowing [place] to share in the care of your patient.   Aaliayah Miao P. [place], [place], [facility] Certified Genetic Counselor [email or web address] phone: [phone]  The patient was seen for a total of 60 minutes in face-to-face genetic counseling.  This patient was discussed with Drs. Magrinat, Lindi Adie and/or [name] who agrees with the above.    _______________________________________________________________________ For Office Staff:  Number of people involved in session: 2 Was an Intern/ student involved with case: no

## 2016-12-30 NOTE — Progress Notes (Signed)
Please see the Nurse Progress Note in the MD Initial Consult Encounter for this patient. 

## 2017-01-02 ENCOUNTER — Other Ambulatory Visit: Payer: Self-pay | Admitting: Hematology

## 2017-01-02 ENCOUNTER — Ambulatory Visit (HOSPITAL_BASED_OUTPATIENT_CLINIC_OR_DEPARTMENT_OTHER): Payer: Medicare HMO | Admitting: Hematology

## 2017-01-02 ENCOUNTER — Ambulatory Visit
Admission: RE | Admit: 2017-01-02 | Discharge: 2017-01-02 | Disposition: A | Discharge status: Home / Self Care | Payer: Medicare HMO | Source: Ambulatory Visit | Attending: Radiation Oncology | Admitting: Radiation Oncology

## 2017-01-02 ENCOUNTER — Ambulatory Visit (HOSPITAL_BASED_OUTPATIENT_CLINIC_OR_DEPARTMENT_OTHER): Payer: Medicare HMO

## 2017-01-02 ENCOUNTER — Telehealth: Payer: Self-pay | Admitting: Hematology

## 2017-01-02 ENCOUNTER — Other Ambulatory Visit (HOSPITAL_BASED_OUTPATIENT_CLINIC_OR_DEPARTMENT_OTHER): Payer: Medicare HMO

## 2017-01-02 VITALS — BP 165/68 | HR 76 | Temp 97.7°F | Resp 18

## 2017-01-02 DIAGNOSIS — C773 Secondary and unspecified malignant neoplasm of axilla and upper limb lymph nodes: Secondary | ICD-10-CM | POA: Diagnosis not present

## 2017-01-02 DIAGNOSIS — R630 Anorexia: Secondary | ICD-10-CM

## 2017-01-02 DIAGNOSIS — C7981 Secondary malignant neoplasm of breast: Secondary | ICD-10-CM | POA: Diagnosis not present

## 2017-01-02 DIAGNOSIS — I10 Essential (primary) hypertension: Secondary | ICD-10-CM | POA: Diagnosis not present

## 2017-01-02 DIAGNOSIS — C50911 Malignant neoplasm of unspecified site of right female breast: Secondary | ICD-10-CM | POA: Diagnosis not present

## 2017-01-02 DIAGNOSIS — N644 Mastodynia: Secondary | ICD-10-CM | POA: Diagnosis not present

## 2017-01-02 DIAGNOSIS — R5383 Other fatigue: Secondary | ICD-10-CM

## 2017-01-02 DIAGNOSIS — R42 Dizziness and giddiness: Secondary | ICD-10-CM

## 2017-01-02 DIAGNOSIS — C7951 Secondary malignant neoplasm of bone: Secondary | ICD-10-CM

## 2017-01-02 DIAGNOSIS — Z9221 Personal history of antineoplastic chemotherapy: Secondary | ICD-10-CM | POA: Diagnosis not present

## 2017-01-02 DIAGNOSIS — Z171 Estrogen receptor negative status [ER-]: Secondary | ICD-10-CM

## 2017-01-02 DIAGNOSIS — Z95828 Presence of other vascular implants and grafts: Secondary | ICD-10-CM

## 2017-01-02 DIAGNOSIS — C778 Secondary and unspecified malignant neoplasm of lymph nodes of multiple regions: Secondary | ICD-10-CM | POA: Diagnosis not present

## 2017-01-02 DIAGNOSIS — N183 Chronic kidney disease, stage 3 (moderate): Secondary | ICD-10-CM

## 2017-01-02 DIAGNOSIS — Z510 Encounter for antineoplastic radiation therapy: Secondary | ICD-10-CM | POA: Diagnosis not present

## 2017-01-02 DIAGNOSIS — D61810 Antineoplastic chemotherapy induced pancytopenia: Secondary | ICD-10-CM | POA: Diagnosis not present

## 2017-01-02 DIAGNOSIS — D630 Anemia in neoplastic disease: Secondary | ICD-10-CM | POA: Diagnosis not present

## 2017-01-02 DIAGNOSIS — Z7982 Long term (current) use of aspirin: Secondary | ICD-10-CM | POA: Diagnosis not present

## 2017-01-02 DIAGNOSIS — Z79899 Other long term (current) drug therapy: Secondary | ICD-10-CM | POA: Diagnosis not present

## 2017-01-02 LAB — COMPREHENSIVE METABOLIC PANEL
ALBUMIN: 3.5 g/dL (ref 3.5–5.0)
ALK PHOS: 57 U/L (ref 40–150)
ALT: 19 U/L (ref 0–55)
AST: 25 U/L (ref 5–34)
Anion Gap: 9 mEq/L (ref 3–11)
BUN: 34.2 mg/dL — AB (ref 7.0–26.0)
CALCIUM: 8.9 mg/dL (ref 8.4–10.4)
CO2: 23 mEq/L (ref 22–29)
CREATININE: 1.6 mg/dL — AB (ref 0.6–1.1)
Chloride: 108 mEq/L (ref 98–109)
EGFR: 36 mL/min/{1.73_m2} — ABNORMAL LOW (ref >90)
GLUCOSE: 101 mg/dL (ref 70–140)
Potassium: 4.1 mEq/L (ref 3.5–5.1)
SODIUM: 141 meq/L (ref 136–145)
TOTAL PROTEIN: 6.9 g/dL (ref 6.4–8.3)
Total Bilirubin: 0.43 mg/dL (ref 0.20–1.20)

## 2017-01-02 LAB — CBC WITH DIFFERENTIAL/PLATELET
BASO%: 1.2 % (ref 0.0–2.0)
BASOS ABS: 0 10*3/uL (ref 0.0–0.1)
EOS%: 0.7 % (ref 0.0–7.0)
Eosinophils Absolute: 0 10*3/uL (ref 0.0–0.5)
HEMATOCRIT: 26 % — AB (ref 34.8–46.6)
HEMOGLOBIN: 8.2 g/dL — AB (ref 11.6–15.9)
LYMPH#: 0.3 10*3/uL — AB (ref 0.9–3.3)
LYMPH%: 17.3 % (ref 14.0–49.7)
MCH: 26.5 pg (ref 25.1–34.0)
MCHC: 31.7 g/dL (ref 31.5–36.0)
MCV: 83.7 fL (ref 79.5–101.0)
MONO#: 0.1 10*3/uL (ref 0.1–0.9)
MONO%: 6.6 % (ref 0.0–14.0)
NEUT%: 74.2 % (ref 38.4–76.8)
NEUTROS ABS: 1.5 10*3/uL (ref 1.5–6.5)
Platelets: 207 10*3/uL (ref 145–400)
RBC: 3.11 10*6/uL — ABNORMAL LOW (ref 3.70–5.45)
RDW: 18.7 % — AB (ref 11.2–14.5)
WBC: 2 10*3/uL — AB (ref 3.9–10.3)

## 2017-01-02 MED ORDER — ONDANSETRON HCL 4 MG/2ML IJ SOLN
8.0000 mg | Freq: Once | INTRAMUSCULAR | Status: AC
  Administered 2017-01-02: 8 mg via INTRAVENOUS

## 2017-01-02 MED ORDER — ALTEPLASE 2 MG IJ SOLR
2.0000 mg | Freq: Once | INTRAMUSCULAR | Status: DC | PRN
  Filled 2017-01-02: qty 2

## 2017-01-02 MED ORDER — PROMETHAZINE HCL 25 MG/ML IJ SOLN
25.0000 mg | Freq: Once | INTRAMUSCULAR | Status: DC
  Filled 2017-01-02: qty 1

## 2017-01-02 MED ORDER — SODIUM CHLORIDE 0.9 % IV SOLN
Freq: Once | INTRAVENOUS | Status: AC
  Administered 2017-01-02: 11:00:00 via INTRAVENOUS

## 2017-01-02 MED ORDER — ONDANSETRON HCL 4 MG/2ML IJ SOLN
INTRAMUSCULAR | Status: AC
Start: 2017-01-02 — End: 2017-01-02
  Filled 2017-01-02: qty 4

## 2017-01-02 MED ORDER — SODIUM CHLORIDE 0.9 % IV SOLN: Freq: Once | INTRAVENOUS | Status: DC

## 2017-01-02 MED ORDER — HEPARIN SOD (PORK) LOCK FLUSH 100 UNIT/ML IV SOLN
500.0000 [IU] | Freq: Once | INTRAVENOUS | Status: AC | PRN
  Administered 2017-01-02: 500 [IU]
  Filled 2017-01-02: qty 5

## 2017-01-02 MED ORDER — SODIUM CHLORIDE 0.9% FLUSH
10.0000 mL | INTRAVENOUS | Status: DC | PRN
  Administered 2017-01-02: 10 mL
  Filled 2017-01-02: qty 10

## 2017-01-02 NOTE — Progress Notes (Signed)
Patient reports feeling like the room was spinning after CT SIM.  Orthostatic vitals taken: bp sitting 151/84, hr 83, bp standing 140/78 hr 89.  She reports that she hasn't taken her bp medication for 3 days and also that she is not eating or drinking much.  She did feel dizzy upon standing for her bp to be taken.

## 2017-01-02 NOTE — Progress Notes (Signed)
Escorted patient to the restroom.  She went to stand up and started spitting up.  She said she was very lightheaded and felt like she was going to pass out.  Sat her back in the wheelchair and brought her to room 7 and helped her to lay on the exam table.  Patient was placed on 2L of oxygen.  Her o2 sat was 100%.  Patient said she felt better.  Dr. Sondra Come notified of patient's status.

## 2017-01-02 NOTE — Patient Instructions (Signed)
Dehydration, Adult Dehydration is when there is not enough fluid or water in your body. This happens when you lose more fluids than you take in. Dehydration can range from mild to very bad. It should be treated right away to keep it from getting very bad. Symptoms of mild dehydration may include:  Thirst.  Dry lips.  Slightly dry mouth.  Dry, warm skin.  Dizziness. Symptoms of moderate dehydration may include:  Very dry mouth.  Muscle cramps.  Dark pee (urine). Pee may be the color of tea.  Your body making less pee.  Your eyes making fewer tears.  Heartbeat that is uneven or faster than normal (palpitations).  Headache.  Light-headedness, especially when you stand up from sitting.  Fainting (syncope). Symptoms of very bad dehydration may include:  Changes in skin, such as: ? Cold and clammy skin. ? Blotchy (mottled) or pale skin. ? Skin that does not quickly return to normal after being lightly pinched and let go (poor skin turgor).  Changes in body fluids, such as: ? Feeling very thirsty. ? Your eyes making fewer tears. ? Not sweating when body temperature is high, such as in hot weather. ? Your body making very little pee.  Changes in vital signs, such as: ? Weak pulse. ? Pulse that is more than 100 beats a minute when you are sitting still. ? Fast breathing. ? Low blood pressure.  Other changes, such as: ? Sunken eyes. ? Cold hands and feet. ? Confusion. ? Lack of energy (lethargy). ? Trouble waking up from sleep. ? Short-term weight loss. ? Unconsciousness. Follow these instructions at home:  If told by your doctor, drink an ORS: ? Make an ORS by using instructions on the package. ? Start by drinking small amounts, about  cup (120 mL) every 5-10 minutes. ? Slowly drink more until you have had the amount that your doctor said to have.  Drink enough clear fluid to keep your pee clear or pale yellow. If you were told to drink an ORS, finish the ORS  first, then start slowly drinking clear fluids. Drink fluids such as: ? Water. Do not drink only water by itself. Doing that can make the salt (sodium) level in your body get too low (hyponatremia). ? Ice chips. ? Fruit juice that you have added water to (diluted). ? Low-calorie sports drinks.  Avoid: ? Alcohol. ? Drinks that have a lot of sugar. These include high-calorie sports drinks, fruit juice that does not have water added, and soda. ? Caffeine. ? Foods that are greasy or have a lot of fat or sugar.  Take over-the-counter and prescription medicines only as told by your doctor.  Do not take salt tablets. Doing that can make the salt level in your body get too high (hypernatremia).  Eat foods that have minerals (electrolytes). Examples include bananas, oranges, potatoes, tomatoes, and spinach.  Keep all follow-up visits as told by your doctor. This is important. Contact a doctor if:  You have belly (abdominal) pain that: ? Gets worse. ? Stays in one area (localizes).  You have a rash.  You have a stiff neck.  You get angry or annoyed more easily than normal (irritability).  You are more sleepy than normal.  You have a harder time waking up than normal.  You feel: ? Weak. ? Dizzy. ? Very thirsty.  You have peed (urinated) only a small amount of very dark pee during 6-8 hours. Get help right away if:  You have symptoms of   very bad dehydration.  You cannot drink fluids without throwing up (vomiting).  Your symptoms get worse with treatment.  You have a fever.  You have a very bad headache.  You are throwing up or having watery poop (diarrhea) and it: ? Gets worse. ? Does not go away.  You have blood or something green (bile) in your throw-up.  You have blood in your poop (stool). This may cause poop to look black and tarry.  You have not peed in 6-8 hours.  You pass out (faint).  Your heart rate when you are sitting still is more than 100 beats a  minute.  You have trouble breathing. This information is not intended to replace advice given to you by your health care provider. Make sure you discuss any questions you have with your health care provider. Document Released: 03/30/2009 Document Revised: 12/22/2015 Document Reviewed: 07/28/2015 Elsevier Interactive Patient Education  2018 [employer]  

## 2017-01-02 NOTE — Progress Notes (Signed)
[place]  Telephone:([phone]) [phone] Fax:(336) [phone]  Clinic Follow Up Note   Patient Care Team: [name], MD as PCP - General (Internal Medicine) 01/02/2017   CHIEF COMPLAINTS:  Follow up right breast cancer   Oncology History   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 [facility])   Staging form: Breast, AJCC 7th Edition   - Clinical stage from 11/30/2015: Stage IV (T4d, N3c, M1) - Signed by Truitt Merle, MD on 01/05/2016      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 [facility])   11/30/2015 Initial Diagnosis    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 [facility])      11/30/2015 Initial Biopsy    Right axilla lymph node biopsy showed metastatic poorly differentiated adenocarcinoma, IHC positive for CK AE1/AE3, CK7, E-cadherin, CDX-2, and focally very weak to equivocal staining for ER      12/21/2015 Imaging    Bilateral breast MRI showed extensive non-mass enhancement involving the entire right breast, right axillary, supraclavicular, and internal mammary adenopathy, new left axillary adenopathy      12/27/2015 Mammogram    Diagnostic mammogram and ultrasound of right breast and axilla showed diffuse skin thickening and increased density, no focal mass, numerous enlarged right axillary lymph node, the largest measuring 5.3cm.       12/27/2015 Initial Biopsy    Right breast core needle biopsy showed invasive ductal carcinoma, and DCIS, lymphovascular invasion identified      12/27/2015 Receptors her2    ER negative, PR negative, Ki-67 70%, HER-2 negative      01/03/2016 Imaging    PET scan showed diffuse asymmetric right breast hypermetabolic soft tissue density and skin thickening, hypermetabolic metastatic lymphadenopathy in bilateral axillary and subpectoral regions, right supraclavicular region and the right cervical level 5      01/10/2016 - 05/28/2016 Chemotherapy    Weekly Taxol 80 mg/m, second cycle was  postponed to 8/22 due to pt's non-compliance, stopped due to disease progression. She developed mild peripheral neuropathy in early Dec 2017.      02/07/2016 Pathology Results    Left axillary lymph node biopsy showed metastatic poorly differentiated carcinoma, triple negative.      04/24/2016 Imaging    CT CHEST, ABDOMEN AND PELVIS WITH CONTRAST IMPRESSION: 1. Bulky right axillary and mild right retropectoral and right supraclavicular lymphadenopathy is mildly decreased since 01/03/2016 PET-CT. 2. Left axillary lymphadenopathy is mildly increased. 3. Diffuse asymmetric thickening of the skin and fibroglandular soft tissues of the right breast is not appreciably changed. 4. No definite additional sites of metastatic disease in the chest, abdomen or pelvis. 5. Subcentimeter right liver lobe lesion is too small to characterize and is probably stable. 6. Additional findings include aortic atherosclerosis, 1 vessel coronary atherosclerosis, tiny hiatal hernia and myomatous uterus.      06/05/2016 Imaging    PET scan showed progressive right breast mass, extending in size and metabolic activity. Enlarging bilateral axillary adenopathy likewise in increased activity. A right supraclavicular lymph node is slightly smaller than before, no other new metastasis.      06/21/2016 - 08/12/2016 Chemotherapy    Adriamycin 60 mg/m, and Cytoxan 600 mg/m, every 2-3 weeks, for a total of 4 cycles.       08/23/2016 PET scan    1. Response to therapy. Significant decrease in size and hypermetabolism of right breast mass and bilateral axillary nodal metastasis. Hypermetabolism within a right supraclavicular node has resolved.  2. New soft tissue fullness and hypermetabolism within the lateral left breast, suspicious for either metastatic disease or new primary. Mastitis could look similar. 3. No extrathoracic metastatic disease identified. 4.  Coronary artery atherosclerosis. Aortic atherosclerosis. 5.  Diffuse marrow hypermetabolism is likely related to stimulation by chemotherapy. This decreases sensitivity for osseous metastasis.      09/09/2016 -  Chemotherapy    Carboplatin AUC 2, and gemcitabine 1000 mg/m, on day 1 and 8 every 21 days, dose reduced from cycle 2 due to significant neutropenia and thrombocytopenia. Neulasta added on day 9 from cycle 2 and granix added day 2 cycle 2  Due to cancer progression change treatment to Eribulin infusion 2 weeks on and 1 week starting 6/21       11/27/2016 PET scan    PET 11/27/16 IMPRESSION: 1. Interval progression of disease. 2. There is increased hypermetabolic activity within bilateral axillary and right pectoral lymph nodes which are similar in overall size to the prior study. 3. There is also extensive hypermetabolic activity throughout both breasts which is nodular and progressive, concerning for local extension or inflammatory breast cancer. 4. New multifocal hypermetabolic osseous metastatic disease. 5. No evidence of metastatic disease to the lungs or within the abdomen or pelvis.        HISTORY OF PRESENTING ILLNESS (12/14/2015) :  [name] 71 y.o. 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 She is accompanied by her husband to the clinic today.  She noticed a lump at right axilla in 08/2015, no pain or other complains, she also report right breast swelling, no pain, skin erythema, nipple discharge or other complain. She otherwise feels well. She was initially seen at urgent care in March, then was referred to establish her care with prior care physician at [facility] center. Her screening mammogram was negative in January 2017. She subsequently underwent CT chest on 09/27/2015 which showed enlarged and inflamed right breast, bulky right axillary lymph nodes. She was referred to breast Center for diagnostic mammogram and ultrasound of right breast, which was negative.  She underwent ultrasound guided right axillary node biopsy on 12/05/2015, which reviewed port differentiated carcinoma. ER weakly focally positive.  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 She works independently. She lives with her husband. She denies any fever, night sweats, chills, or skin itchiness.  CURRENT THERAPY: 4th line Eribulin infusion 2 weeks on and 1 week off, and Keytruda every 3 weeks, starting 6/21, Eribulin held after 7/12 due to palliative radiation   INTERIM HISTORY:  LDallysreturns for follow up. She presents today in the infusion room. She has been feeling very dizzy this morning and has had a very low appetite and energy since last chemo treatment last week. She is also constipated from taking her tramadol. She denies fever, cough or any other pains.   MEDICAL HISTORY:  Past Medical History:  Diagnosis Date  . Arthritis   . Breast cancer ([place]    right  . Family history of breast cancer   . GERD (gastroesophageal reflux disease)   . Hypertension   . Shortness  of breath   . Sleep apnea   . Stroke [facility])     SURGICAL HISTORY: Past Surgical History:  Procedure Laterality Date  . IR CV LINE INJECTION  11/28/2016  . IR GENERIC HISTORICAL  06/13/2016   IR US GUIDE VASC ACCESS LEFT 06/13/2016 Corrie Mckusick, DO [facility]-INTERV RAD  . IR GENERIC HISTORICAL  06/13/2016   IR FLUORO GUIDE PORT INSERTION LEFT 06/13/2016 Corrie Mckusick, DO [facility]-INTER[facility]  . NO PAST SURGERIES      SOCIAL HISTORY: Social History   Social History  . Marital status: Married    Spouse name: N/A  . Number of children: 4  . Years of education: 8 TH   Occupational History  . Not on file.    Social History Main Topics  . Smoking status: Former Smoker    Packs/day: 0.50    Years: 1.00    Types: Cigarettes    Start date: 06/17/1968    Quit date: 06/17/1973  . Smokeless tobacco: Never Used     Comment: Quit in 57s  . Alcohol use No     Comment: former drinker for approximately 10 years  . Drug use: No  . Sexual activity: Not on file   Other Topics Concern  . Not on file   Social History Narrative   Patient is married with 3 children still living and 1 deceased son.   Patient is right handed.   Patient has 8 th grade education.   Patient drinks 2-3 servings daily.    FAMILY HISTORY: Family History  Problem Relation Age of Onset  . Heart disease Mother   . Cancer Father 5       throat cancer  . Breast cancer Cousin        paternal first cousin dx in her late 68s-early 60s  . Cancer Sister        "cancer down below"    ALLERGIES:  has No Known Allergies.  MEDICATIONS:  Current Outpatient Prescriptions  Medication Sig Dispense Refill  . amLODipine (NORVASC) 10 MG tablet Take 1 tablet (10 mg total) by mouth daily. 90 tablet 3  . aspirin EC 81 MG tablet Take 1 tablet (81 mg total) by mouth daily. 90 tablet 3  . atorvastatin (LIPITOR) 20 MG tablet TAKE 1 TABLET EVERY DAY (Patient taking differently: Take 20 mg by mouth daily) 90 tablet 3  . lidocaine-prilocaine (EMLA) cream Apply to port @ 1 hour prior to treatment. 30 g 1  . metoprolol tartrate (LOPRESSOR) 25 MG tablet TAKE 1 TABLET EVERY DAY 90 tablet 3  . ondansetron (ZOFRAN) 8 MG tablet Take by mouth every 8 (eight) hours as needed for nausea or vomiting.    Marland Kitchen oxyCODONE (OXY IR/ROXICODONE) 5 MG immediate release tablet Take 1 tablet (5 mg total) by mouth every 6 (six) hours as needed for severe pain. (Patient not taking: Reported on 12/30/2016) 60 tablet 0  . prochlorperazine (COMPAZINE) 10 MG tablet Take 10 mg by mouth every 6 (six) hours as needed for nausea or vomiting.    . traMADol (ULTRAM) 50 MG tablet  Take 1 tablet (50 mg total) by mouth every 8 (eight) hours as needed. 60 tablet 1   No current facility-administered medications for this visit.    Facility-Administered Medications Ordered in Other Visits  Medication Dose Route Frequency Provider Last Rate Last Dose  . sodium chloride flush (NS) 0.9 % injection 10 mL  10 mL Intravenous PRN Truitt Merle, MD   10 mL at 08/12/16  1754  . sodium chloride flush (NS) 0.9 % injection 10 mL  10 mL Intravenous PRN Truitt Merle, MD   10 mL at 11/18/16 1516       REVIEW OF SYSTEMS:  Constitutional: Denies fevers, chills or abnormal night sweats  (+)low appetite (+)dizziness Eyes: Denies blurriness of vision, double vision or watery eyes Ears, nose, mouth, throat, and face: Denies mucositis  Respiratory: Denies cough, dyspnea or wheezes Cardiovascular: Denies palpitation, chest discomfort  Gastrointestinal:  Denies nausea, heartburn  (+)constpation  Skin: no skin rashes.  Lymphatics: Denies new lymphadenopathy or easy bruising Neurological: Negative Behavioral/Psych: Mood is stable, no new changes (+) numbness and tingling hands and feet, overall much improved  Breast: (+) pain/hardening in left breast (+) right breast is hardened (+)10/10 pain in right breast All other systems were reviewed with the patient and are negative.   PHYSICAL EXAMINATION:  ECOG PERFORMANCE STATUS: 1 Pressure 141/80, heart rate 76, respiratory rate 18, temperature 36.9, pulse ox 96% on room air. GENERAL:alert, no distress and comfortable SKIN: skin color, texture, turgor are normal, no rashes or significant lesions EYES: normal, conjunctiva are pink and non-injected, sclera clear OROPHARYNX:no exudate, no erythema and lips, buccal mucosa, and tongue normal  NECK: supple, thyroid normal size, non-tender, without nodularity LYMPH:  no palpable lymphadenopathy in the cervical, axillary or inguinal LUNGS: clear to auscultation and percussion with normal breathing effort HEART:  regular rate & rhythm and no murmurs and no lower extremity edema ABDOMEN:abdomen soft, non-tender and normal bowel sounds Musculoskeletal:no cyanosis of digits and no clubbing  PSYCH: alert & oriented x 3 with fluent speech NEURO: Decreased vibration sensation on bilateral ankle and wrist. EXTREMITIES: Bilateral hand and lower extremity edema. Breasts: her entire right breast is very firm., with associated skin edema, and there are 2 small areas in the low part of breast showed is erythematous, concerning for direct a skin lesion from the tumor. There is a 4X5 cm lymph node at the anterior right axilla, close to right chest wall of breast margin, no tenderness, movable. There is a orange size mass in central left breast, firm, and mildly tender. There is a palpable 2 cm lymph node in the left axilla.   LABORATORY DATA:  I have reviewed the data as listed CBC Latest Ref Rng & Units 01/02/2017 12/26/2016 12/11/2016  WBC 3.9 - 10.3 10e3/uL 2.0(L) 3.8(L) 1.8(L)  Hemoglobin 11.6 - 15.9 g/dL 8.2(L) 8.5(L) 7.9(L)  Hematocrit 34.8 - 46.6 % 26.0(L) 27.6(L) 25.8(L)  Platelets 145 - 400 10e3/uL 207 200 191   CMP Latest Ref Rng & Units 01/02/2017 12/26/2016 12/05/2016  Glucose 70 - 140 mg/dl 101 96 97  BUN 7.0 - 26.0 mg/dL 34.2(H) 29.5(H) 23.6  Creatinine 0.6 - 1.1 mg/dL 1.6(H) 1.4(H) 1.1  Sodium 136 - 145 mEq/L 141 143 145  Potassium 3.5 - 5.1 mEq/L 4.1 4.2 3.6  Chloride 101 - 111 mmol/L - - -  CO2 22 - 29 mEq/L _0 Calcium 8.4 - 10.4 mg/dL 8.9 9.2 8.8  Total Protein 6.4 - 8.3 g/dL 6.9 7.0 6.7  Total Bilirubin 0.20 - 1.20 mg/dL 0.43 0.51 0.37  Alkaline Phos 40 - 150 U/L 57 61 63  AST 5 - 34 U/L _1 ALT 0 - 55 U/L _2 CA27.29:  01/05/2016: 155.9 02/27/2016: 96.7 03/26/2016: 64.7 04/23/2016: 65.4 05/21/2016: 99.2 07/15/2016: 215.4  08/12/2016: 176.9 09/09/2016: 112 10/07/16: 109.6 11/18/16: 251.6 12/05/2016: 239.8 12/26/2016: 291.8   PATHOLOGY REPORT: Diagnosis  11/30/2015 Lymph  node, needle/core biopsy, right breast/axilla - LYMPH NODE POSITIVE FOR METASTATIC POORLY DIFFERENTIATED CARCINOMA. - SEE COMMENT. Microscopic Comment Immunohistochemical stains are performed. The tumor is positive for cytokeratin AE1/AE3, cytokeratin 7 and E-cadherin. CDX-2 demonstrates weak positivity. Estrogen receptor demonstrates focal very weak to equivocal staining. Gross cystic disease fluid protein, cytokeratin 20, TTF-1, Melan-A and S100 are all negative. The findings are consistent with a poorly differentiated carcinoma. Given the staining pattern, an upper gastrointestinal and pancreatobiliary primary source should be ruled out. In addition, given the axillary location of the biopsy and focal weak to equivocal estrogen receptor staining, ruling out a poorly differentiated carcinoma of breast primary source is also prudent. Lastly, a gynecologic primary may be considered. Correlation with clinical and radiologic impression is essential. Dr. Vicente Males has seen this case in consultation with essential agreement of the above diagnosis and comment. The findings are called to the [facility] on 12/04/15. (RAH:gt, 12/04/15)  Diagnosis 12/27/2015 Breast, right, needle core biopsy, central - INVASIVE MAMMARY CARCINOMA. - MAMMARY CARCINOMA IN SITU. - LYMPH/VASCULAR INVASION IS IDENTIFIED. - SEE COMMENT. Microscopic Comment An E-cadherin stain will be performed to determine if the carcinoma is ductal or lobular in nature. The results of the stain will be reported in an addendum to follow. Although definitive grading of breast carcinoma is best done on excision, the features of the invasive tumor from the right central breast biopsy are compatible with a grade 3 breast carcinoma. Breast prognostic markers will be performed and reported in an addendum. Findings are called to the [place] on 12/28/2015. Dr. Lyndon Code has seen this case in consultation with  agreement. (RH:kh 12-28-15) ADDENDUM: An E-cadherin immunohistochemical stain is performed which is positive in both the invasive and in situ components confirming the ductal nature of both (i.e. invasive ductal carcinoma with ductal carcinoma in situ). (RAH:gt, 12/29/15) PROGNOSTIC INDICATORS Results: IMMUNOHISTOCHEMICAL AND MORPHOMETRIC ANALYSIS PERFORMED MANUALLY Estrogen Receptor: 0%, NEGATIVE Progesterone Receptor: 0%, NEGATIVE Proliferation Marker Ki67: 70% Results: HER2 - NEGATIVE RATIO OF HER2/CEP17 SIGNALS 1.13 AVERAGE HER2 COPY NUMBER PER CELL 1.75  Diagnosis 02/07/2016 Lymph node, needle/core biopsy, left axillary - METASTATIC POORLY DIFFERENTIATED CARCINOMA. Results: IMMUNOHISTOCHEMICAL AND MORPHOMETRIC ANALYSIS PERFORMED MANUALLY Estrogen Receptor: 0%, NEGATIVE Progesterone Receptor: 0%, NEGATIVE Results: HER2 - NEGATIVE RATIO OF HER2/CEP17 SIGNALS 1.21 AVERAGE HER2 COPY NUMBER PER CELL 2.05  RADIOGRAPHIC STUDIES: I have personally reviewed the radiological images as listed and agreed with the findings in the report.  PET 11/27/16 IMPRESSION: 1. Interval progression of disease. 2. There is increased hypermetabolic activity within bilateral axillary and right pectoral lymph nodes which are similar in overall size to the prior study. 3. There is also extensive hypermetabolic activity throughout both breasts which is nodular and progressive, concerning for local extension or inflammatory breast cancer. 4. New multifocal hypermetabolic osseous metastatic disease. 5. No evidence of metastatic disease to the lungs or within the abdomen or pelvis.  PET 08/23/2016 IMPRESSION: 1. Response to therapy. Significant decrease in size and hypermetabolism of right breast mass and bilateral axillary nodal metastasis. Hypermetabolism within a right supraclavicular node has resolved. 2. New soft tissue fullness and hypermetabolism within the lateral left breast, suspicious for  either metastatic disease or new primary. Mastitis could look similar. 3. No extrathoracic metastatic disease identified. 4.  Coronary artery atherosclerosis. Aortic atherosclerosis. 5. Diffuse marrow hypermetabolism is likely related to stimulation by chemotherapy. This decreases sensitivity for osseous metastasis.  PET 06/05/2016 IMPRESSION: 1. Progressive right breast mass, expanding  in size and metabolic activity. Enlarging but bilateral axillary adenopathy likewise increased activity. A right supraclavicular lymph node is slightly smaller than previous but has a higher standard uptake value. Overall appearance compatible with progression but no further new metastatic spread compared to 01/03/2016. 2. Coronary, aortic arch, and branch vessel atherosclerotic vascular disease. Aortoiliac atherosclerotic vascular disease. 3. Extensive edema along the anterior chest wall especially both breasts, right greater than left.  ASSESSMENT & PLAN:  71 y.o. female, with past medical history of HTN, GERD, sleep apnea, presented with bulky right axillary adenopathy.  1. 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 Unfortunately her cancer has metastasized to left axilla and cervical lymph nodes, which are distant metastasis.  -We previously reviewed the natural history of metastatic triple negative breast cancer, which is very aggressive, and her cancer is incurable at this stage  -Giving the metastatic disease, surgery up front is not indicated, I  recommended systemic chemotherapy. --She is now on third line chemotherapy with carboplatin and gemcitabine, tolerated well, however she has developed a severe neutropenia and thrombocytopenia, and prolonged recovery, does reduced from cycle 2 -We previously discussed PET scan form 11/27/16 shows her cancer is progressing and has metastasized to both breast, lymph nodes and in parts of her bone.  -Due to his cancer progression, I stopped her carboplatin and gemcitabine -We previously discussed further treatment options. I recommend to change chemo to Eribulin for 2 weeks on and 1 week off along with and immunotherapy drug Nat Math, based on the [place]. It os off label use.   -I previously tested her BRCA1/2 to see if she is a candidate for PARPA inhibitor, the test was done on 7/16/208, result is still pending  -she has started 4th line Eribulin and Keytruda  -Due to the concern of his of progression, and likely direct skin invasion, which can cause open wound, I have referred her to radiation oncology to consider palliative radiation to right breast, which she wills tart on 7/31  -She has been tolerating chemotherapy poorly. Due to the dizziness, poor appetite and performance status, I'll hold on chemo eribulin today and during her radiation -will continue Keytruda, she is not doing well today, will reschedule today's treatment to next week -Continue supportive care with pain management, we'll arrange IV fluids today, in 2 days and the next week.  2. ANEMIA in neoplastic disease -She previously developed normocytic mild anemia in the past few months. -Her anemia workup previously showed a normal folic acid and Q94 level, increased ferritin, decreased serum iron, TIBC and transferrin saturation. This is most consistent with anemia of chronic disease, secondary to her CKD and underlying malignancy.  -I previously encouraged her to continue oral iron supplement -Blood transfusion if hemoglobin  less than 8.0.  3. Neutropenia and thrombocytopenia from chemotherapy -She had nadir plt 26K and ANC 0.4K after first cycle chemotherapy -Her blood counts are recovering -We previously reduced her chemotherapy dose the following cycle  4. CKD stage III -Her GFR was previously in 40-50's  -No lab signs of tumor lysis -Possibly related to  her underlying hypertension and diuretics  -Her Cr has previously much improved since she stopped her Lasix. -Cr increased to 1.6 today, will give IVF  -Avoid nephrotoxins, such as NSAIDs and IV CT contrast  5. HTN, GERD, obesity, sleep apnea -She'll continue follow-up with her primary care physician  6. Right breast pain -secondary to breast cancer, previously much improved after chemo  -continue tramadol as needed  -She now feels hardening in right breast but no pain.   7. Dizziness, anorexia and fatigue  -She has been quite fatigued, and complains of intermittent dizziness, especially when she stands up -Probably related to chemotherapy. She does not have orthopedic hypotension, but I will give IVF today and more in 2 days  -continue monitoring   7. Goal of care discussion  -We previously discussed the incurable nature of her cancer, and the overall poor prognosis, especially if she does not have good response to chemotherapy or progress on chemo -The patient understands the goal of care is palliative. -I previously recommend DNR/DNI, she will think about it    PLAN - lab today. She will recieve IV fluids today and saturday along with zofran -start palliative radiation on 01/14/2017 with Dr. Sondra Come, I spoke with Dr. Sondra Come today  - hold chemo today and during radiation. continue with immunotherapy treatment - I will schedule Keytruda from today to next week - take Milk of magnesium for constipation when she gets home -lab and F/U in one week    I spent 25 minutes counseling the patient face to face. The total time spent in the appointment  was 30 minutes and more than 50% was on counseling.  This document serves as a record of services personally performed by Truitt Merle, MD. It was created on her behalf by Brandt Loosen, a trained medical scribe. The creation of this record is based on the scribe's personal observations and the provider's statements to them. This document has been checked and approved by the attending provider.  I have reviewed the above documentation for accuracy and completeness and I agree with the above.    Truitt Merle, MD 01/02/2017

## 2017-01-02 NOTE — Telephone Encounter (Signed)
Gave patient avs report and appointments for July.  °

## 2017-01-04 ENCOUNTER — Ambulatory Visit (HOSPITAL_BASED_OUTPATIENT_CLINIC_OR_DEPARTMENT_OTHER): Payer: Medicare HMO

## 2017-01-04 VITALS — BP 145/73 | HR 76 | Temp 98.1°F | Resp 18

## 2017-01-04 DIAGNOSIS — Z95828 Presence of other vascular implants and grafts: Secondary | ICD-10-CM

## 2017-01-04 DIAGNOSIS — C50911 Malignant neoplasm of unspecified site of right female breast: Secondary | ICD-10-CM | POA: Diagnosis not present

## 2017-01-04 MED ORDER — HEPARIN SOD (PORK) LOCK FLUSH 100 UNIT/ML IV SOLN
500.0000 [IU] | Freq: Once | INTRAVENOUS | Status: AC | PRN
Start: 2017-01-04 — End: 2017-01-04
  Administered 2017-01-04: 500 [IU] via INTRAVENOUS
  Filled 2017-01-04: qty 5

## 2017-01-04 MED ORDER — SODIUM CHLORIDE 0.9 % IV SOLN
Freq: Once | INTRAVENOUS | Status: AC
  Administered 2017-01-04: 10:00:00 via INTRAVENOUS

## 2017-01-04 MED ORDER — TBO-FILGRASTIM 480 MCG/0.8ML ~~LOC~~ SOSY: 480.0000 ug | PREFILLED_SYRINGE | Freq: Once | SUBCUTANEOUS | Status: DC

## 2017-01-04 MED ORDER — SODIUM CHLORIDE 0.9% FLUSH
10.0000 mL | INTRAVENOUS | Status: DC | PRN
  Administered 2017-01-04: 10 mL via INTRAVENOUS
  Filled 2017-01-04: qty 10

## 2017-01-04 MED ORDER — ONDANSETRON HCL 4 MG/2ML IJ SOLN
INTRAMUSCULAR | Status: AC
  Filled 2017-01-04: qty 4

## 2017-01-04 NOTE — Patient Instructions (Signed)
Dehydration, Adult Dehydration is a condition in which there is not enough fluid or water in the body. This happens when you lose more fluids than you take in. Important organs, such as the kidneys, brain, and heart, cannot function without a proper amount of fluids. Any loss of fluids from the body can lead to dehydration. Dehydration can range from mild to severe. This condition should be treated right away to prevent it from becoming severe. What are the causes? This condition may be caused by:  Vomiting.  Diarrhea.  Excessive sweating, such as from heat exposure or exercise.  Not drinking enough fluid, especially: ? When ill. ? While doing activity that requires a lot of energy.  Excessive urination.  Fever.  Infection.  Certain medicines, such as medicines that cause the body to lose excess fluid (diuretics).  Inability to access safe drinking water.  Reduced physical ability to get adequate water and food.  What increases the risk? This condition is more likely to develop in people:  Who have a poorly controlled long-term (chronic) illness, such as diabetes, heart disease, or kidney disease.  Who are age 65 or older.  Who are disabled.  Who live in a place with high altitude.  Who play endurance sports.  What are the signs or symptoms? Symptoms of mild dehydration may include:  Thirst.  Dry lips.  Slightly dry mouth.  Dry, warm skin.  Dizziness. Symptoms of moderate dehydration may include:  Very dry mouth.  Muscle cramps.  Dark urine. Urine may be the color of tea.  Decreased urine production.  Decreased tear production.  Heartbeat that is irregular or faster than normal (palpitations).  Headache.  Light-headedness, especially when you stand up from a sitting position.  Fainting (syncope). Symptoms of severe dehydration may include:  Changes in skin, such as: ? Cold and clammy skin. ? Blotchy (mottled) or pale skin. ? Skin that does  not quickly return to normal after being lightly pinched and released (poor skin turgor).  Changes in body fluids, such as: ? Extreme thirst. ? No tear production. ? Inability to sweat when body temperature is high, such as in hot weather. ? Very little urine production.  Changes in vital signs, such as: ? Weak pulse. ? Pulse that is more than 100 beats a minute when sitting still. ? Rapid breathing. ? Low blood pressure.  Other changes, such as: ? Sunken eyes. ? Cold hands and feet. ? Confusion. ? Lack of energy (lethargy). ? Difficulty waking up from sleep. ? Short-term weight loss. ? Unconsciousness. How is this diagnosed? This condition is diagnosed based on your symptoms and a physical exam. Blood and urine tests may be done to help confirm the diagnosis. How is this treated? Treatment for this condition depends on the severity. Mild or moderate dehydration can often be treated at home. Treatment should be started right away. Do not wait until dehydration becomes severe. Severe dehydration is an emergency and it needs to be treated in a hospital. Treatment for mild dehydration may include:  Drinking more fluids.  Replacing salts and minerals in your blood (electrolytes) that you may have lost. Treatment for moderate dehydration may include:  Drinking an oral rehydration solution (ORS). This is a drink that helps you replace fluids and electrolytes (rehydrate). It can be found at pharmacies and retail stores. Treatment for severe dehydration may include:  Receiving fluids through an IV tube.  Receiving an electrolyte solution through a feeding tube that is passed through your nose   and into your stomach (nasogastric tube, or NG tube).  Correcting any abnormalities in electrolytes.  Treating the underlying cause of dehydration. Follow these instructions at home:  If directed by your health care provider, drink an ORS: ? Make an ORS by following instructions on the  package. ? Start by drinking small amounts, about  cup (120 mL) every 5-10 minutes. ? Slowly increase how much you drink until you have taken the amount recommended by your health care provider.  Drink enough clear fluid to keep your urine clear or pale yellow. If you were told to drink an ORS, finish the ORS first, then start slowly drinking other clear fluids. Drink fluids such as: ? Water. Do not drink only water. Doing that can lead to having too little salt (sodium) in the body (hyponatremia). ? Ice chips. ? Fruit juice that you have added water to (diluted fruit juice). ? Low-calorie sports drinks.  Avoid: ? Alcohol. ? Drinks that contain a lot of sugar. These include high-calorie sports drinks, fruit juice that is not diluted, and soda. ? Caffeine. ? Foods that are greasy or contain a lot of fat or sugar.  Take over-the-counter and prescription medicines only as told by your health care provider.  Do not take sodium tablets. This can lead to having too much sodium in the body (hypernatremia).  Eat foods that contain a healthy balance of electrolytes, such as bananas, oranges, potatoes, tomatoes, and spinach.  Keep all follow-up visits as told by your health care provider. This is important. Contact a health care provider if:  You have abdominal pain that: ? Gets worse. ? Stays in one area (localizes).  You have a rash.  You have a stiff neck.  You are more irritable than usual.  You are sleepier or more difficult to wake up than usual.  You feel weak or dizzy.  You feel very thirsty.  You have urinated only a small amount of very dark urine over 6-8 hours. Get help right away if:  You have symptoms of severe dehydration.  You cannot drink fluids without vomiting.  Your symptoms get worse with treatment.  You have a fever.  You have a severe headache.  You have vomiting or diarrhea that: ? Gets worse. ? Does not go away.  You have blood or green matter  (bile) in your vomit.  You have blood in your stool. This may cause stool to look black and tarry.  You have not urinated in 6-8 hours.  You faint.  Your heart rate while sitting still is over 100 beats a minute.  You have trouble breathing. This information is not intended to replace advice given to you by your health care provider. Make sure you discuss any questions you have with your health care provider. Document Released: 06/03/2005 Document Revised: 12/29/2015 Document Reviewed: 07/28/2015 [employer] Interactive Patient Education  2018 [employer]  

## 2017-01-05 ENCOUNTER — Encounter: Payer: Self-pay | Admitting: Hematology

## 2017-01-07 ENCOUNTER — Other Ambulatory Visit: Payer: Self-pay | Admitting: Hematology

## 2017-01-07 DIAGNOSIS — Z853 Personal history of malignant neoplasm of breast: Secondary | ICD-10-CM

## 2017-01-07 DIAGNOSIS — N63 Unspecified lump in unspecified breast: Secondary | ICD-10-CM

## 2017-01-08 LAB — CULTURE, BLOOD (SINGLE)

## 2017-01-08 NOTE — Progress Notes (Signed)
  Radiation Oncology         ([phone]) [phone] ________________________________  Name: [name] MRN: [id number]  Date: 01/02/2017  DOB: 08/13/1945  SIMULATION AND TREATMENT PLANNING NOTE    ICD-10-CM   1. 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 [facility]) C50.911     DIAGNOSIS:  71 y.o. 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  NARRATIVE:  The patient was brought to the [place].  Identity was confirmed.  All relevant records and images related to the planned course of therapy were reviewed.  The patient freely provided informed written consent to proceed with treatment after reviewing the details related to the planned course of therapy. The consent form was witnessed and verified by the simulation staff.  Then, the patient was set-up in a stable reproducible  supine position for radiation therapy.  CT images were obtained.  Surface markings were placed.  The CT images were loaded into the planning software.  Then the target and avoidance structures were contoured.  Treatment planning then occurred.  The radiation prescription was entered and confirmed.  Then, I designed and supervised the construction of a total of 5 medically necessary complex treatment devices.  I have requested : 3D Simulation  I have requested a DVH of the following structures: Heart, lungs, GTV I have ordered:dose calc.  PLAN:  The patient will receive 45 Gy in 25 fractions directed at both breast areas. The patient will be treated with 2 separate tangential beam arrangements.  -----------------------------------  Blair Promise, PhD, MD

## 2017-01-09 ENCOUNTER — Encounter: Payer: Self-pay | Admitting: Genetic Counselor

## 2017-01-09 ENCOUNTER — Ambulatory Visit: Payer: Medicare HMO | Admitting: Radiation Oncology

## 2017-01-09 ENCOUNTER — Telehealth: Payer: Self-pay | Admitting: Genetic Counselor

## 2017-01-09 DIAGNOSIS — C50911 Malignant neoplasm of unspecified site of right female breast: Secondary | ICD-10-CM | POA: Diagnosis not present

## 2017-01-09 DIAGNOSIS — C7981 Secondary malignant neoplasm of breast: Secondary | ICD-10-CM | POA: Diagnosis not present

## 2017-01-09 DIAGNOSIS — C773 Secondary and unspecified malignant neoplasm of axilla and upper limb lymph nodes: Secondary | ICD-10-CM | POA: Diagnosis not present

## 2017-01-09 DIAGNOSIS — Z1379 Encounter for other screening for genetic and chromosomal anomalies: Secondary | ICD-10-CM | POA: Insufficient documentation

## 2017-01-09 DIAGNOSIS — Z7982 Long term (current) use of aspirin: Secondary | ICD-10-CM | POA: Diagnosis not present

## 2017-01-09 DIAGNOSIS — Z510 Encounter for antineoplastic radiation therapy: Secondary | ICD-10-CM | POA: Diagnosis not present

## 2017-01-09 DIAGNOSIS — Z171 Estrogen receptor negative status [ER-]: Secondary | ICD-10-CM | POA: Diagnosis not present

## 2017-01-09 DIAGNOSIS — Z79899 Other long term (current) drug therapy: Secondary | ICD-10-CM | POA: Diagnosis not present

## 2017-01-09 DIAGNOSIS — Z9221 Personal history of antineoplastic chemotherapy: Secondary | ICD-10-CM | POA: Diagnosis not present

## 2017-01-09 DIAGNOSIS — N644 Mastodynia: Secondary | ICD-10-CM | POA: Diagnosis not present

## 2017-01-09 NOTE — Telephone Encounter (Signed)
Called both phone numbers.  One did not have a VM that was set up yet, and the other just rang and rang.  Letter will be sent to patient stating that we have tried to call but have not been able to reach her.

## 2017-01-09 NOTE — Progress Notes (Signed)
[place]  [phone] [phone]  Clinic Follow Up Note   Patient Care Team: [name], MD as PCP - General (Internal Medicine) 01/10/2017   CHIEF COMPLAINTS:  Follow up right breast cancer   Oncology History   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 [facility])   Staging form: Breast, AJCC 7th Edition   - Clinical stage from 11/30/2015: Stage IV (T4d, N3c, M1) - Signed by Truitt Merle, MD on 01/05/2016      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 [facility])   11/30/2015 Initial Diagnosis    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 [facility])      11/30/2015 Initial Biopsy    Right axilla lymph node biopsy showed metastatic poorly differentiated adenocarcinoma, IHC positive for CK AE1/AE3, CK7, E-cadherin, CDX-2, and focally very weak to equivocal staining for ER      12/21/2015 Imaging    Bilateral breast MRI showed extensive non-mass enhancement involving the entire right breast, right axillary, supraclavicular, and internal mammary adenopathy, new left axillary adenopathy      12/27/2015 Mammogram    Diagnostic mammogram and ultrasound of right breast and axilla showed diffuse skin thickening and increased density, no focal mass, numerous enlarged right axillary lymph node, the largest measuring 5.3cm.       12/27/2015 Initial Biopsy    Right breast core needle biopsy showed invasive ductal carcinoma, and DCIS, lymphovascular invasion identified      12/27/2015 Receptors her2    ER negative, PR negative, Ki-67 70%, HER-2 negative      01/03/2016 Imaging    PET scan showed diffuse asymmetric right breast hypermetabolic soft tissue density and skin thickening, hypermetabolic metastatic lymphadenopathy in bilateral axillary and subpectoral regions, right supraclavicular region and the right cervical level 5      01/10/2016 - 05/28/2016 Chemotherapy    Weekly Taxol 80 mg/m, second cycle was  postponed to 8/22 due to pt's non-compliance, stopped due to disease progression. She developed mild peripheral neuropathy in early Dec 2017.      02/07/2016 Pathology Results    Left axillary lymph node biopsy showed metastatic poorly differentiated carcinoma, triple negative.      04/24/2016 Imaging    CT CHEST, ABDOMEN AND PELVIS WITH CONTRAST IMPRESSION: 1. Bulky right axillary and mild right retropectoral and right supraclavicular lymphadenopathy is mildly decreased since 01/03/2016 PET-CT. 2. Left axillary lymphadenopathy is mildly increased. 3. Diffuse asymmetric thickening of the skin and fibroglandular soft tissues of the right breast is not appreciably changed. 4. No definite additional sites of metastatic disease in the chest, abdomen or pelvis. 5. Subcentimeter right liver lobe lesion is too small to characterize and is probably stable. 6. Additional findings include aortic atherosclerosis, 1 vessel coronary atherosclerosis, tiny hiatal hernia and myomatous uterus.      06/05/2016 Imaging    PET scan showed progressive right breast mass, extending in size and metabolic activity. Enlarging bilateral axillary adenopathy likewise in increased activity. A right supraclavicular lymph node is slightly smaller than before, no other new metastasis.      06/21/2016 - 08/12/2016 Chemotherapy    Adriamycin 60 mg/m, and Cytoxan 600 mg/m, every 2-3 weeks, for a total of 4 cycles.       08/23/2016 PET scan    1. Response to therapy. Significant decrease in size and hypermetabolism of right breast mass and bilateral axillary nodal metastasis. Hypermetabolism within a right supraclavicular node has resolved.  2. New soft tissue fullness and hypermetabolism within the lateral left breast, suspicious for either metastatic disease or new primary. Mastitis could look similar. 3. No extrathoracic metastatic disease identified. 4.  Coronary artery atherosclerosis. Aortic atherosclerosis. 5.  Diffuse marrow hypermetabolism is likely related to stimulation by chemotherapy. This decreases sensitivity for osseous metastasis.      09/09/2016 -  Chemotherapy    Carboplatin AUC 2, and gemcitabine 1000 mg/m, on day 1 and 8 every 21 days, dose reduced from cycle 2 due to significant neutropenia and thrombocytopenia. Neulasta added on day 9 from cycle 2 and granix added day 2 cycle 2  Due to cancer progression change treatment to Eribulin infusion 2 weeks on and 1 week starting 6/21       11/27/2016 PET scan    PET 11/27/16 IMPRESSION: 1. Interval progression of disease. 2. There is increased hypermetabolic activity within bilateral axillary and right pectoral lymph nodes which are similar in overall size to the prior study. 3. There is also extensive hypermetabolic activity throughout both breasts which is nodular and progressive, concerning for local extension or inflammatory breast cancer. 4. New multifocal hypermetabolic osseous metastatic disease. 5. No evidence of metastatic disease to the lungs or within the abdomen or pelvis.       01/07/2017 Genetic Testing    Negative genetic testing on the common hereditary cancer panel.  The Hereditary Gene Panel offered by [facility] includes sequencing and/or deletion duplication testing of the following 46 genes: APC, ATM, AXIN2, BARD1, BMPR1A, BRCA1, BRCA2, BRIP1, CDH1, CDKN2A (p14ARF), CDKN2A (p16INK4a), CHEK2, CTNNA1, DICER1, EPCAM (Deletion/duplication testing only), GREM1 (promoter region deletion/duplication testing only), KIT, MEN1, MLH1, MSH2, MSH3, MSH6, MUTYH, NBN, NF1, NHTL1, PALB2, PDGFRA, PMS2, POLD1, POLE, PTEN, RAD50, RAD51C, RAD51D, SDHB, SDHC, SDHD, SMAD4, SMARCA4. STK11, TP53, TSC1, TSC2, and VHL.  The following genes were evaluated for sequence changes only: SDHA and HOXB13 c.251G>A variant only.  The report date is January 07, 2017.        HISTORY OF PRESENTING ILLNESS (12/14/2015) :  [name] 71 y.o. female is  here because of her recently diagnosed metastatic carcinoma to right axilla. She is accompanied by her husband to the clinic today.  She noticed a lump at right axilla in 08/2015, no pain or other complains, she also report right breast swelling, no pain, skin erythema, nipple discharge or other complain. She otherwise feels well. She was initially seen at urgent care in March, then was referred to establish her care with prior care physician at [facility] internal medicine center. Her screening mammogram was negative in January 2017. She subsequently underwent CT chest on 09/27/2015 which showed enlarged and inflamed right breast, bulky right axillary lymph nodes. She was referred to breast Center for diagnostic mammogram and ultrasound of right breast, which was negative. She underwent ultrasound guided right axillary node biopsy on 12/05/2015, which reviewed port differentiated carcinoma. ER weakly focally positive.  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 She works independently. She lives with her husband. She denies any fever, night sweats, chills, or skin itchiness.  CURRENT THERAPY: 4th line Eribulin infusion 2 weeks on and  1 week off, and Keytruda every 3 weeks, started 12/05/16, Eribulin held after 7/12 due to palliative radiation   INTERIM HISTORY:  Mikisha returns for follow up. Te patient is accompanied by her husband today. She reports she feels much better today than she has previously last week. IV fluids last week helped her feel better. She reports she can ambulate around the home with assistance. She is in a wheelchair in the clinic today. She reports her appetite has not been good; she  is drinking "a lot" of water and Boost nutrition supplement. She reports pain to her back ongoing.    MEDICAL HISTORY:  Past Medical History:  Diagnosis Date  . Arthritis   . Breast cancer ([place])    right  . Family history of breast cancer   . GERD (gastroesophageal reflux disease)   . Hypertension   . Shortness of breath   . Sleep apnea   . Stroke [facility])     SURGICAL HISTORY: Past Surgical History:  Procedure Laterality Date  . IR CV LINE INJECTION  11/28/2016  . IR GENERIC HISTORICAL  06/13/2016   IR US GUIDE VASC ACCESS LEFT 06/13/2016 Corrie Mckusick, DO [facility]-INTER[facility]  . IR GENERIC HISTORICAL  06/13/2016   IR FLUORO GUIDE PORT INSERTION LEFT 06/13/2016 Corrie Mckusick, DO [facility]  . NO PAST SURGERIES      SOCIAL HISTORY: Social History   Social History  . Marital status: Married    Spouse name: N/A  . Number of children: 4  . Years of education: 8 TH   Occupational History  . Not on file.   Social History Main Topics  . Smoking status: Former Smoker    Packs/day: 0.50    Years: 1.00    Types: Cigarettes    Start date: 06/17/1968    Quit date: 06/17/1973  . Smokeless tobacco: Never Used     Comment: Quit in 62s  . Alcohol use No     Comment: former drinker for approximately 10 years  . Drug use: No  . Sexual activity: Not on file   Other Topics Concern  . Not on file   Social History Narrative   Patient is married with 3 children still living and 1 deceased son.   Patient is right handed.   Patient has 8 th grade education.   Patient drinks 2-3 servings daily.    FAMILY HISTORY: Family History  Problem Relation Age of Onset  . Heart disease Mother   . Cancer Father 33       throat cancer  . Breast cancer Cousin        paternal first cousin dx in her late 90s-early 60s  . Cancer Sister        "cancer down below"    ALLERGIES:  has No Known Allergies.  MEDICATIONS:  Current Outpatient Prescriptions  Medication Sig Dispense Refill  .  amLODipine (NORVASC) 10 MG tablet Take 1 tablet (10 mg total) by mouth daily. 90 tablet 3  . aspirin EC 81 MG tablet Take 1 tablet (81 mg total) by mouth daily. 90 tablet 3  . atorvastatin (LIPITOR) 20 MG tablet TAKE 1 TABLET EVERY DAY (Patient taking differently: Take 20 mg by mouth daily) 90 tablet 3  . lidocaine-prilocaine (EMLA) cream Apply to port @ 1 hour prior to treatment. 30 g 1  . metoprolol tartrate (LOPRESSOR) 25 MG tablet TAKE 1 TABLET EVERY DAY 90 tablet 3  . ondansetron (ZOFRAN) 8 MG tablet Take by mouth  every 8 (eight) hours as needed for nausea or vomiting.    Marland Kitchen oxyCODONE (OXY IR/ROXICODONE) 5 MG immediate release tablet Take 1 tablet (5 mg total) by mouth every 6 (six) hours as needed for severe pain. (Patient not taking: Reported on 12/30/2016) 60 tablet 0  . prochlorperazine (COMPAZINE) 10 MG tablet Take 10 mg by mouth every 6 (six) hours as needed for nausea or vomiting.    . traMADol (ULTRAM) 50 MG tablet Take 1 tablet (50 mg total) by mouth every 8 (eight) hours as needed. 60 tablet 1   No current facility-administered medications for this visit.    Facility-Administered Medications Ordered in Other Visits  Medication Dose Route Frequency Provider Last Rate Last Dose  . sodium chloride flush (NS) 0.9 % injection 10 mL  10 mL Intravenous PRN Truitt Merle, MD   10 mL at 08/12/16 1754  . sodium chloride flush (NS) 0.9 % injection 10 mL  10 mL Intravenous PRN Truitt Merle, MD   10 mL at 11/18/16 1516       REVIEW OF SYSTEMS: Constitutional: Denies fevers, chills or abnormal night sweats  (+)low appetite (+)dizziness Eyes: Denies blurriness of vision, double vision or watery eyes Ears, nose, mouth, throat, and face: Denies mucositis  Respiratory: Denies cough, dyspnea or wheezes Cardiovascular: Denies palpitation, chest discomfort  Gastrointestinal:  Denies nausea, heartburn  (+)constpation  Skin: no skin rashes.  Lymphatics: Denies new lymphadenopathy or easy  bruising Musculoskeletal: (+) difficulty ambulating Neurological: Negative Behavioral/Psych: Mood is stable, no new changes (+) numbness and tingling hands and feet, overall much improved  Breast: (+) pain/hardening in left breast (+) right breast is hardened (+)10/10 pain in right breast All other systems were reviewed with the patient and are negative.   PHYSICAL EXAMINATION: ECOG PERFORMANCE STATUS: 1 Pressure 141/80, heart rate 76, respiratory rate 18, temperature 36.9, pulse ox 96% on room air. GENERAL:alert, no distress and comfortable SKIN: skin color, texture, turgor are normal, no rashes or significant lesions EYES: normal, conjunctiva are pink and non-injected, sclera clear OROPHARYNX:no exudate, no erythema and lips, buccal mucosa, and tongue normal  NECK: supple, thyroid normal size, non-tender, without nodularity LYMPH:  no palpable lymphadenopathy in the cervical, axillary or inguinal LUNGS: clear to auscultation and percussion with normal breathing effort HEART: regular rate & rhythm and no murmurs and no lower extremity edema ABDOMEN:abdomen soft, non-tender and normal bowel sounds Musculoskeletal:no cyanosis of digits and no clubbing  PSYCH: alert & oriented x 3 with fluent speech NEURO: Decreased vibration sensation on bilateral ankle and wrist. EXTREMITIES: Bilateral hand and lower extremity edema. Breasts: her entire right breast is very firm., with associated skin edema, and there are multiple small areas in the low part of right breast which are erythematous, concerning for direct skin invasion from the tumor. There is a 4X5 cm lymph node at the anterior right axilla, close to right chest wall of breast margin, no tenderness, movable. There is a orange size mass in central left breast, firm, and mildly tender. There is a palpable 2 cm lymph node in the left axilla. There are multiple new subcutaneous nodules below the right breast, which is highly suspicious for skin  metastasis.     LABORATORY DATA:  I have reviewed the data as listed CBC Latest Ref Rng & Units 01/10/2017 01/02/2017 12/26/2016  WBC 3.9 - 10.3 10e3/uL 2.3(L) 2.0(L) 3.8(L)  Hemoglobin 11.6 - 15.9 g/dL 8.8(L) 8.2(L) 8.5(L)  Hematocrit 34.8 - 46.6 % 27.9(L) 26.0(L) 27.6(L)  Platelets 145 -  400 10e3/uL 265 207 200   CMP Latest Ref Rng & Units 01/10/2017 01/02/2017 12/26/2016  Glucose 70 - 140 mg/dl 86 101 96  BUN 7.0 - 26.0 mg/dL 26.8(H) 34.2(H) 29.5(H)  Creatinine 0.6 - 1.1 mg/dL 1.3(H) 1.6(H) 1.4(H)  Sodium 136 - 145 mEq/L 139 141 143  Potassium 3.5 - 5.1 mEq/L 4.7 4.1 4.2  Chloride 101 - 111 mmol/L - - -  CO2 22 - 29 mEq/L _0 Calcium 8.4 - 10.4 mg/dL 9.3 8.9 9.2  Total Protein 6.4 - 8.3 g/dL 7.3 6.9 7.0  Total Bilirubin 0.20 - 1.20 mg/dL 0.55 0.43 0.51  Alkaline Phos 40 - 150 U/L 62 57 61  AST 5 - 34 U/L _1 ALT 0 - 55 U/L _2 ANC 1.0K TODAY   CA27.29:  01/05/2016: 155.9 02/27/2016: 96.7 03/26/2016: 64.7 04/23/2016: 65.4 05/21/2016: 99.2 07/15/2016: 215.4  08/12/2016: 176.9 09/09/2016: 112 10/07/16: 109.6 11/18/16: 251.6 12/05/2016: 239.8 12/26/2016: 291.8   PATHOLOGY REPORT: Diagnosis 11/30/2015 Lymph node, needle/core biopsy, right breast/axilla - LYMPH NODE POSITIVE FOR METASTATIC POORLY DIFFERENTIATED CARCINOMA. - SEE COMMENT. Microscopic Comment Immunohistochemical stains are performed. The tumor is positive for cytokeratin AE1/AE3, cytokeratin 7 and E-cadherin. CDX-2 demonstrates weak positivity. Estrogen receptor demonstrates focal very weak to equivocal staining. Gross cystic disease fluid protein, cytokeratin 20, TTF-1, Melan-A and S100 are all negative. The findings are consistent with a poorly differentiated carcinoma. Given the staining pattern, an upper gastrointestinal and pancreatobiliary primary source should be ruled out. In addition, given the axillary location of the biopsy and focal weak to equivocal estrogen receptor staining, ruling out  a poorly differentiated carcinoma of breast primary source is also prudent. Lastly, a gynecologic primary may be considered. Correlation with clinical and radiologic impression is essential. Dr. Vicente Males has seen this case in consultation with essential agreement of the above diagnosis and comment. The findings are called to the [place] on 12/04/15. (RAH:gt, 12/04/15)  Diagnosis 12/27/2015 Breast, right, needle core biopsy, central - INVASIVE MAMMARY CARCINOMA. - MAMMARY CARCINOMA IN SITU. - LYMPH/VASCULAR INVASION IS IDENTIFIED. - SEE COMMENT. Microscopic Comment An E-cadherin stain will be performed to determine if the carcinoma is ductal or lobular in nature. The results of the stain will be reported in an addendum to follow. Although definitive grading of breast carcinoma is best done on excision, the features of the invasive tumor from the right central breast biopsy are compatible with a grade 3 breast carcinoma. Breast prognostic markers will be performed and reported in an addendum. Findings are called to the [place] on 12/28/2015. Dr. Lyndon Code has seen this case in consultation with agreement. (RH:kh 12-28-15) ADDENDUM: An E-cadherin immunohistochemical stain is performed which is positive in both the invasive and in situ components confirming the ductal nature of both (i.e. invasive ductal carcinoma with ductal carcinoma in situ). (RAH:gt, 12/29/15) PROGNOSTIC INDICATORS Results: IMMUNOHISTOCHEMICAL AND MORPHOMETRIC ANALYSIS PERFORMED MANUALLY Estrogen Receptor: 0%, NEGATIVE Progesterone Receptor: 0%, NEGATIVE Proliferation Marker Ki67: 70% Results: HER2 - NEGATIVE RATIO OF HER2/CEP17 SIGNALS 1.13 AVERAGE HER2 COPY NUMBER PER CELL 1.75  Diagnosis 02/07/2016 Lymph node, needle/core biopsy, left axillary - METASTATIC POORLY DIFFERENTIATED CARCINOMA. Results: IMMUNOHISTOCHEMICAL AND MORPHOMETRIC ANALYSIS PERFORMED MANUALLY Estrogen Receptor:  0%, NEGATIVE Progesterone Receptor: 0%, NEGATIVE Results: HER2 - NEGATIVE RATIO OF HER2/CEP17 SIGNALS 1.21 AVERAGE HER2 COPY NUMBER PER CELL 2.05  RADIOGRAPHIC STUDIES: I have personally reviewed the radiological images as listed and agreed with the findings in the  report.  PET 11/27/16 IMPRESSION: 1. Interval progression of disease. 2. There is increased hypermetabolic activity within bilateral axillary and right pectoral lymph nodes which are similar in overall size to the prior study. 3. There is also extensive hypermetabolic activity throughout both breasts which is nodular and progressive, concerning for local extension or inflammatory breast cancer. 4. New multifocal hypermetabolic osseous metastatic disease. 5. No evidence of metastatic disease to the lungs or within the abdomen or pelvis.  PET 08/23/2016 IMPRESSION: 1. Response to therapy. Significant decrease in size and hypermetabolism of right breast mass and bilateral axillary nodal metastasis. Hypermetabolism within a right supraclavicular node has resolved. 2. New soft tissue fullness and hypermetabolism within the lateral left breast, suspicious for either metastatic disease or new primary. Mastitis could look similar. 3. No extrathoracic metastatic disease identified. 4.  Coronary artery atherosclerosis. Aortic atherosclerosis. 5. Diffuse marrow hypermetabolism is likely related to stimulation by chemotherapy. This decreases sensitivity for osseous metastasis.  PET 06/05/2016 IMPRESSION: 1. Progressive right breast mass, expanding in size and metabolic activity. Enlarging but bilateral axillary adenopathy likewise increased activity. A right supraclavicular lymph node is slightly smaller than previous but has a higher standard uptake value. Overall appearance compatible with progression but no further new metastatic spread compared to 01/03/2016. 2. Coronary, aortic arch, and branch vessel atherosclerotic  vascular disease. Aortoiliac atherosclerotic vascular disease. 3. Extensive edema along the anterior chest wall especially both breasts, right greater than left.  ASSESSMENT & PLAN:  71 y.o. female, with past medical history of HTN, GERD, sleep apnea, presented with bulky right axillary adenopathy.  1. 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 Unfortunately her cancer has metastasized to left axilla and cervical lymph nodes, which are distant metastasis.  -We previously reviewed the natural history of metastatic triple negative breast cancer, which is very aggressive, and her cancer is incurable at this stage  -Giving the metastatic disease, surgery up front is not indicated, I recommended systemic chemotherapy. We previously discussed PET scan form 11/27/16 shows her cancer is progressing and has metastasized to both breast, lymph nodes and in parts of her bone.  -she is currently on 4th line chemo with Eribulin for 2 weeks on and 1 week off along with and immunotherapy drug Nat Math, based on the [place]. It is off label use.   -I previously tested her BRCA1/2 to see if she is a candidate for PARPA inhibitor, the test was done on 7/16/208, result is still pending  -she has started 4th line Eribulin and Keytruda  -Due to the concern of his of progression, and likely direct skin invasion, which can cause open wound, I have referred her to radiation oncology to consider palliative radiation to right breast, which she will start on 7/31   -She has been tolerating chemotherapy poorly with significant pancytopenia. Eribulin was held after second dose on 12/26/16 due to poor tolerance and pending palliative radiation. -She was seen by a radiation oncologist Dr. Sondra Come and is scheduled to start palliative of bilateral breast radiation next week.  -She tried oxycodone for her breast pain, but  felt is too strong. She is taking tramadol as needed for now. She still has hydrocodone at home, I encouraged her to use as needed.  2. ANEMIA in neoplastic disease -She previously developed normocytic mild anemia in the past few months. -Her anemia workup previously showed a normal folic acid and E56 level, increased ferritin, decreased serum iron, TIBC and transferrin saturation. This is most consistent with anemia of chronic disease, secondary to her CKD and underlying malignancy.  -I previously encouraged her to continue oral iron supplement -Blood transfusion if hemoglobin less than 8.0.  3. Neutropenia and thrombocytopenia from chemotherapy -She had nadir plt 26K and ANC 0.4K after first cycle chemotherapy -Her blood counts are recovering. -We previously reduced her chemotherapy dose the following cycle. - ANC is 1.0k today, 01/10/17. We'll repeat it next week.  4. CKD stage III -Her GFR was previously in 40-50's  -No lab signs of tumor lysis -Possibly related to her underlying hypertension and diuretics  -Her Cr has previously much improved since she stopped her Lasix. -Cr increased to 1.6 as of 01/02/17, so she received IVF. -Avoid nephrotoxins, such as NSAIDs and IV CT contrast - Patient should stay well hydrated.   5. HTN, GERD, obesity, sleep apnea -She'll continue follow-up with her primary care physician  6. Right breast pain -secondary to breast cancer, previously much improved after chemo  -continue tramadol as needed  -She previously felt hardening in right breast but no pain.  - Continue Tramadol prescribed by Dr.  Sondra Come.  7. Dizziness, anorexia and fatigue  -She has been quite fatigued, and complained of intermittent dizziness, especially when she stands up the week of 7/20 -secondary to chemotherapy. She does not have orthopedic hypotension, but I gave IVF previously.  -improved this week - The patient can ambulate around the house with help, but is scared of falling. I encouraged her to use a walker around the home as a precaution. - She is in a wheelchair in the clinic.  7. Goal of care discussion  -We previously discussed the incurable nature of her cancer, and the overall poor prognosis, especially if she does not have good response to chemotherapy or progress on chemo -The patient understands the goal of care is palliative. -I previously recommend DNR/DNI, she will think about it    PLAN - Labs reviewed today, stable. Will proceed cycle 2 Keytruda today and continue every 3 weeks schedule her radiation.  - Lab, flush 01/15/17. - Lab, flush, and Keytruda in 3 weeks. - Start palliative RT with Dr. Sondra Come on 01/14/17. - Hold chemo Eribulin during RT.   I spent 25 minutes counseling the patient face to face. The total time spent in the appointment was 30 minutes and more than 50% was on counseling.  This document serves as a record of services personally performed by Truitt Merle, MD. It was created on her behalf by Maryla Morrow, a trained medical scribe. The creation of this record is based on the scribe's personal observations and the provider's statements to them. This document has been checked and approved by the attending provider.  I have reviewed the above documentation for accuracy and completeness and I agree with the above.    Truitt Merle, MD 01/10/2017

## 2017-01-10 ENCOUNTER — Ambulatory Visit (HOSPITAL_BASED_OUTPATIENT_CLINIC_OR_DEPARTMENT_OTHER): Payer: Medicare HMO

## 2017-01-10 ENCOUNTER — Telehealth: Payer: Self-pay | Admitting: Genetic Counselor

## 2017-01-10 ENCOUNTER — Encounter: Payer: Self-pay | Admitting: Hematology

## 2017-01-10 ENCOUNTER — Ambulatory Visit (HOSPITAL_BASED_OUTPATIENT_CLINIC_OR_DEPARTMENT_OTHER): Payer: Medicare HMO | Admitting: Hematology

## 2017-01-10 ENCOUNTER — Other Ambulatory Visit (HOSPITAL_BASED_OUTPATIENT_CLINIC_OR_DEPARTMENT_OTHER): Payer: Medicare HMO

## 2017-01-10 ENCOUNTER — Encounter: Payer: Self-pay | Admitting: Genetic Counselor

## 2017-01-10 VITALS — BP 152/56 | HR 81 | Temp 98.7°F | Resp 18 | Ht 64.0 in | Wt 174.1 lb

## 2017-01-10 DIAGNOSIS — Z452 Encounter for adjustment and management of vascular access device: Secondary | ICD-10-CM | POA: Diagnosis not present

## 2017-01-10 DIAGNOSIS — E669 Obesity, unspecified: Secondary | ICD-10-CM

## 2017-01-10 DIAGNOSIS — D630 Anemia in neoplastic disease: Secondary | ICD-10-CM | POA: Diagnosis not present

## 2017-01-10 DIAGNOSIS — C50911 Malignant neoplasm of unspecified site of right female breast: Secondary | ICD-10-CM

## 2017-01-10 DIAGNOSIS — Z171 Estrogen receptor negative status [ER-]: Secondary | ICD-10-CM | POA: Diagnosis not present

## 2017-01-10 DIAGNOSIS — I10 Essential (primary) hypertension: Secondary | ICD-10-CM | POA: Diagnosis not present

## 2017-01-10 DIAGNOSIS — C773 Secondary and unspecified malignant neoplasm of axilla and upper limb lymph nodes: Secondary | ICD-10-CM

## 2017-01-10 DIAGNOSIS — Z95828 Presence of other vascular implants and grafts: Secondary | ICD-10-CM

## 2017-01-10 DIAGNOSIS — Z87891 Personal history of nicotine dependence: Secondary | ICD-10-CM

## 2017-01-10 DIAGNOSIS — D6959 Other secondary thrombocytopenia: Secondary | ICD-10-CM

## 2017-01-10 DIAGNOSIS — N183 Chronic kidney disease, stage 3 unspecified: Secondary | ICD-10-CM

## 2017-01-10 DIAGNOSIS — Z5112 Encounter for antineoplastic immunotherapy: Secondary | ICD-10-CM | POA: Diagnosis not present

## 2017-01-10 DIAGNOSIS — D701 Agranulocytosis secondary to cancer chemotherapy: Secondary | ICD-10-CM | POA: Diagnosis not present

## 2017-01-10 DIAGNOSIS — G4733 Obstructive sleep apnea (adult) (pediatric): Secondary | ICD-10-CM

## 2017-01-10 LAB — COMPREHENSIVE METABOLIC PANEL
ALK PHOS: 62 U/L (ref 40–150)
ALT: 9 U/L (ref 0–55)
AST: 15 U/L (ref 5–34)
Albumin: 3.5 g/dL (ref 3.5–5.0)
Anion Gap: 11 mEq/L (ref 3–11)
BUN: 26.8 mg/dL — ABNORMAL HIGH (ref 7.0–26.0)
CO2: 24 meq/L (ref 22–29)
CREATININE: 1.3 mg/dL — AB (ref 0.6–1.1)
Calcium: 9.3 mg/dL (ref 8.4–10.4)
Chloride: 105 mEq/L (ref 98–109)
EGFR: 46 mL/min/{1.73_m2} — AB (ref >90)
GLUCOSE: 86 mg/dL (ref 70–140)
Potassium: 4.7 mEq/L (ref 3.5–5.1)
SODIUM: 139 meq/L (ref 136–145)
TOTAL PROTEIN: 7.3 g/dL (ref 6.4–8.3)
Total Bilirubin: 0.55 mg/dL (ref 0.20–1.20)

## 2017-01-10 LAB — CBC WITH DIFFERENTIAL/PLATELET
BASO%: 1.2 % (ref 0.0–2.0)
Basophils Absolute: 0 10*3/uL (ref 0.0–0.1)
EOS%: 0.9 % (ref 0.0–7.0)
Eosinophils Absolute: 0 10*3/uL (ref 0.0–0.5)
HCT: 27.9 % — ABNORMAL LOW (ref 34.8–46.6)
HGB: 8.8 g/dL — ABNORMAL LOW (ref 11.6–15.9)
LYMPH%: 22.2 % (ref 14.0–49.7)
MCH: 25.6 pg (ref 25.1–34.0)
MCHC: 31.4 g/dL — ABNORMAL LOW (ref 31.5–36.0)
MCV: 81.4 fL (ref 79.5–101.0)
MONO#: 0.7 10*3/uL (ref 0.1–0.9)
MONO%: 31.5 % — AB (ref 0.0–14.0)
NEUT%: 44.2 % (ref 38.4–76.8)
NEUTROS ABS: 1 10*3/uL — AB (ref 1.5–6.5)
Platelets: 265 10*3/uL (ref 145–400)
RBC: 3.43 10*6/uL — AB (ref 3.70–5.45)
RDW: 18.8 % — ABNORMAL HIGH (ref 11.2–14.5)
WBC: 2.3 10*3/uL — AB (ref 3.9–10.3)
lymph#: 0.5 10*3/uL — ABNORMAL LOW (ref 0.9–3.3)

## 2017-01-10 MED ORDER — SODIUM CHLORIDE 0.9 % IV SOLN
Freq: Once | INTRAVENOUS | Status: AC
  Administered 2017-01-10: 14:00:00 via INTRAVENOUS

## 2017-01-10 MED ORDER — PEMBROLIZUMAB CHEMO INJECTION 100 MG/4ML
200.0000 mg | Freq: Once | INTRAVENOUS | Status: AC
  Administered 2017-01-10: 200 mg via INTRAVENOUS
  Filled 2017-01-10: qty 8

## 2017-01-10 MED ORDER — SODIUM CHLORIDE 0.9% FLUSH
10.0000 mL | INTRAVENOUS | Status: DC | PRN
  Filled 2017-01-10: qty 10

## 2017-01-10 MED ORDER — HEPARIN SOD (PORK) LOCK FLUSH 100 UNIT/ML IV SOLN
500.0000 [IU] | Freq: Once | INTRAVENOUS | Status: DC | PRN
  Filled 2017-01-10: qty 5

## 2017-01-10 MED ORDER — SODIUM CHLORIDE 0.9% FLUSH
10.0000 mL | INTRAVENOUS | Status: DC | PRN
  Administered 2017-01-10: 10 mL via INTRAVENOUS
  Filled 2017-01-10: qty 10

## 2017-01-10 NOTE — Telephone Encounter (Signed)
I have tried 3 numbers.  Two just ring and finally disconnect, the other does not have a VM that has been set up.  I will send a letter for her to contact me.

## 2017-01-10 NOTE — Progress Notes (Signed)
Per Dr [name] OK for [facility] today with ANC 1.0

## 2017-01-10 NOTE — Patient Instructions (Signed)
[facility] Discharge Instructions for Patients Receiving Chemotherapy  Today you received the following chemotherapy agents:  Keytruda.  To help prevent nausea and vomiting after your treatment, we encourage you to take your nausea medication as prescribed.   If you develop nausea and vomiting that is not controlled by your nausea medication, call the clinic.   BELOW ARE SYMPTOMS THAT SHOULD BE REPORTED IMMEDIATELY:  *FEVER GREATER THAN 100.5 F  *CHILLS WITH OR WITHOUT FEVER  NAUSEA AND VOMITING THAT IS NOT CONTROLLED WITH YOUR NAUSEA MEDICATION  *UNUSUAL SHORTNESS OF BREATH  *UNUSUAL BRUISING OR BLEEDING  TENDERNESS IN MOUTH AND THROAT WITH OR WITHOUT PRESENCE OF ULCERS  *URINARY PROBLEMS  *BOWEL PROBLEMS  UNUSUAL RASH Items with * indicate a potential emergency and should be followed up as soon as possible.  Feel free to call the clinic you have any questions or concerns. The clinic phone number is [phone].  Please show the CHEMO ALERT CARD at check-in to the Emergency Department and triage nurse.   

## 2017-01-13 ENCOUNTER — Ambulatory Visit: Admission: RE | Admit: 2017-01-13 | Payer: Medicare HMO | Source: Ambulatory Visit | Admitting: Radiation Oncology

## 2017-01-13 ENCOUNTER — Ambulatory Visit: Payer: Medicare HMO

## 2017-01-14 ENCOUNTER — Ambulatory Visit
Admission: RE | Admit: 2017-01-14 | Discharge: 2017-01-14 | Disposition: A | Discharge status: Home / Self Care | Payer: Medicare HMO | Source: Ambulatory Visit | Attending: Radiation Oncology | Admitting: Radiation Oncology

## 2017-01-14 DIAGNOSIS — Z9221 Personal history of antineoplastic chemotherapy: Secondary | ICD-10-CM | POA: Diagnosis not present

## 2017-01-14 DIAGNOSIS — N644 Mastodynia: Secondary | ICD-10-CM | POA: Diagnosis not present

## 2017-01-14 DIAGNOSIS — Z79899 Other long term (current) drug therapy: Secondary | ICD-10-CM | POA: Diagnosis not present

## 2017-01-14 DIAGNOSIS — Z171 Estrogen receptor negative status [ER-]: Secondary | ICD-10-CM | POA: Diagnosis not present

## 2017-01-14 DIAGNOSIS — C7981 Secondary malignant neoplasm of breast: Secondary | ICD-10-CM | POA: Diagnosis not present

## 2017-01-14 DIAGNOSIS — C50911 Malignant neoplasm of unspecified site of right female breast: Secondary | ICD-10-CM | POA: Diagnosis not present

## 2017-01-14 DIAGNOSIS — Z510 Encounter for antineoplastic radiation therapy: Secondary | ICD-10-CM | POA: Diagnosis not present

## 2017-01-14 DIAGNOSIS — Z7982 Long term (current) use of aspirin: Secondary | ICD-10-CM | POA: Diagnosis not present

## 2017-01-14 DIAGNOSIS — C773 Secondary and unspecified malignant neoplasm of axilla and upper limb lymph nodes: Secondary | ICD-10-CM | POA: Diagnosis not present

## 2017-01-14 MED ORDER — RADIAPLEXRX EX GEL
Freq: Once | CUTANEOUS | Status: AC
  Administered 2017-01-14: 15:00:00 via TOPICAL

## 2017-01-14 MED ORDER — ALRA NON-METALLIC DEODORANT (RAD-ONC)
1.0000 "application " | Freq: Once | TOPICAL | Status: AC
  Administered 2017-01-14: 1 via TOPICAL

## 2017-01-14 NOTE — Progress Notes (Signed)
Pt here for patient teaching.  Pt given Radiation and You booklet, Alra deodorant and Radiaplex gel.  Reviewed areas of pertinence such as fatigue, skin changes, breast tenderness and breast swelling . Pt able to give teach back of to pat skin and use unscented/gentle soap,apply Radiaplex bid and avoid applying anything to skin within 4 hours of treatment. Pt demonstrated understanding and verbalizes understanding of information given and will contact nursing with any questions or concerns.

## 2017-01-15 ENCOUNTER — Ambulatory Visit
Admission: RE | Admit: 2017-01-15 | Discharge: 2017-01-15 | Disposition: A | Discharge status: Home / Self Care | Payer: Medicare HMO | Source: Ambulatory Visit | Attending: Radiation Oncology | Admitting: Radiation Oncology

## 2017-01-15 DIAGNOSIS — C50911 Malignant neoplasm of unspecified site of right female breast: Secondary | ICD-10-CM | POA: Diagnosis not present

## 2017-01-15 DIAGNOSIS — Z7982 Long term (current) use of aspirin: Secondary | ICD-10-CM | POA: Diagnosis not present

## 2017-01-15 DIAGNOSIS — Z510 Encounter for antineoplastic radiation therapy: Secondary | ICD-10-CM | POA: Diagnosis not present

## 2017-01-15 DIAGNOSIS — Z9221 Personal history of antineoplastic chemotherapy: Secondary | ICD-10-CM | POA: Diagnosis not present

## 2017-01-15 DIAGNOSIS — Z171 Estrogen receptor negative status [ER-]: Secondary | ICD-10-CM | POA: Diagnosis not present

## 2017-01-15 DIAGNOSIS — Z79899 Other long term (current) drug therapy: Secondary | ICD-10-CM | POA: Diagnosis not present

## 2017-01-15 DIAGNOSIS — N644 Mastodynia: Secondary | ICD-10-CM | POA: Diagnosis not present

## 2017-01-15 DIAGNOSIS — C773 Secondary and unspecified malignant neoplasm of axilla and upper limb lymph nodes: Secondary | ICD-10-CM | POA: Diagnosis not present

## 2017-01-15 DIAGNOSIS — C7981 Secondary malignant neoplasm of breast: Secondary | ICD-10-CM | POA: Diagnosis not present

## 2017-01-16 ENCOUNTER — Ambulatory Visit: Payer: Medicare HMO

## 2017-01-17 ENCOUNTER — Ambulatory Visit: Payer: Medicare HMO

## 2017-01-18 ENCOUNTER — Ambulatory Visit: Payer: Medicare HMO

## 2017-01-20 ENCOUNTER — Ambulatory Visit: Payer: Medicare HMO

## 2017-01-20 ENCOUNTER — Encounter (HOSPITAL_COMMUNITY): Payer: Self-pay | Admitting: Emergency Medicine

## 2017-01-20 ENCOUNTER — Emergency Department (HOSPITAL_COMMUNITY)
Admission: EM | Admit: 2017-01-20 | Discharge: 2017-01-20 | Disposition: A | Discharge status: Home / Self Care | Payer: Medicare HMO | Attending: Emergency Medicine | Admitting: Emergency Medicine

## 2017-01-20 ENCOUNTER — Emergency Department (HOSPITAL_COMMUNITY): Payer: Medicare HMO

## 2017-01-20 DIAGNOSIS — C50911 Malignant neoplasm of unspecified site of right female breast: Secondary | ICD-10-CM | POA: Diagnosis not present

## 2017-01-20 DIAGNOSIS — I10 Essential (primary) hypertension: Secondary | ICD-10-CM | POA: Diagnosis not present

## 2017-01-20 DIAGNOSIS — R52 Pain, unspecified: Secondary | ICD-10-CM | POA: Insufficient documentation

## 2017-01-20 DIAGNOSIS — Y842 Radiological procedure and radiotherapy as the cause of abnormal reaction of the patient, or of later complication, without mention of misadventure at the time of the procedure: Secondary | ICD-10-CM | POA: Diagnosis not present

## 2017-01-20 DIAGNOSIS — R9431 Abnormal electrocardiogram [ECG] [EKG]: Secondary | ICD-10-CM | POA: Diagnosis not present

## 2017-01-20 DIAGNOSIS — Z87891 Personal history of nicotine dependence: Secondary | ICD-10-CM | POA: Diagnosis not present

## 2017-01-20 DIAGNOSIS — R110 Nausea: Secondary | ICD-10-CM | POA: Insufficient documentation

## 2017-01-20 DIAGNOSIS — Z79899 Other long term (current) drug therapy: Secondary | ICD-10-CM | POA: Diagnosis not present

## 2017-01-20 DIAGNOSIS — K208 Other esophagitis: Secondary | ICD-10-CM | POA: Diagnosis not present

## 2017-01-20 DIAGNOSIS — R21 Rash and other nonspecific skin eruption: Secondary | ICD-10-CM | POA: Diagnosis not present

## 2017-01-20 DIAGNOSIS — C50919 Malignant neoplasm of unspecified site of unspecified female breast: Secondary | ICD-10-CM | POA: Insufficient documentation

## 2017-01-20 DIAGNOSIS — T66XXXA Radiation sickness, unspecified, initial encounter: Secondary | ICD-10-CM

## 2017-01-20 DIAGNOSIS — R0789 Other chest pain: Secondary | ICD-10-CM | POA: Diagnosis not present

## 2017-01-20 LAB — CBC
HCT: 29 % — ABNORMAL LOW (ref 36.0–46.0)
Hemoglobin: 9 g/dL — ABNORMAL LOW (ref 12.0–15.0)
MCH: 25 pg — ABNORMAL LOW (ref 26.0–34.0)
MCHC: 31 g/dL (ref 30.0–36.0)
MCV: 80.6 fL (ref 78.0–100.0)
PLATELETS: 310 10*3/uL (ref 150–400)
RBC: 3.6 MIL/uL — ABNORMAL LOW (ref 3.87–5.11)
RDW: 17.7 % — AB (ref 11.5–15.5)
WBC: 4.2 10*3/uL (ref 4.0–10.5)

## 2017-01-20 LAB — BASIC METABOLIC PANEL
Anion gap: 11 (ref 5–15)
BUN: 31 mg/dL — AB (ref 6–20)
CALCIUM: 9 mg/dL (ref 8.9–10.3)
CO2: 25 mmol/L (ref 22–32)
CREATININE: 1.63 mg/dL — AB (ref 0.44–1.00)
Chloride: 105 mmol/L (ref 101–111)
GFR calc Af Amer: 36 mL/min — ABNORMAL LOW (ref >60)
GFR, EST NON AFRICAN AMERICAN: 31 mL/min — AB (ref >60)
GLUCOSE: 89 mg/dL (ref 65–99)
Potassium: 5.1 mmol/L (ref 3.5–5.1)
SODIUM: 141 mmol/L (ref 135–145)

## 2017-01-20 LAB — POCT I-STAT TROPONIN I: TROPONIN I, POC: 0.02 ng/mL (ref 0.00–0.08)

## 2017-01-20 MED ORDER — RANITIDINE HCL 150 MG PO CAPS: 150.0000 mg | ORAL_CAPSULE | Freq: Every day | ORAL | 0 refills | Status: AC

## 2017-01-20 MED ORDER — GI COCKTAIL ~~LOC~~
30.0000 mL | Freq: Once | ORAL | Status: AC
  Administered 2017-01-20: 30 mL via ORAL
  Filled 2017-01-20: qty 30

## 2017-01-20 MED ORDER — OXYCODONE HCL 5 MG/5ML PO SOLN
2.5000 mg | Freq: Once | ORAL | Status: AC
  Administered 2017-01-20: 2.5 mg via ORAL
  Filled 2017-01-20: qty 5

## 2017-01-20 MED ORDER — SUCRALFATE 1 GM/10ML PO SUSP: 1.0000 g | Freq: Three times a day (TID) | ORAL | 0 refills | Status: AC

## 2017-01-20 MED ORDER — OXYCODONE HCL 5 MG/5ML PO SOLN: 2.5000 mg | ORAL | 0 refills | Status: DC | PRN

## 2017-01-20 NOTE — Discharge Instructions (Signed)
You can take up to 5 mL of the pain medication as needed. If this is too strong, take smaller amounts to help your pain.

## 2017-01-20 NOTE — ED Notes (Signed)
Pt will have blood collected by port

## 2017-01-20 NOTE — ED Provider Notes (Signed)
[facility] DEPT Provider Note   CSN: [id number] Arrival date & time: 01/20/17  1520     History   Chief Complaint Chief Complaint  Patient presents with  . Generalized Body Aches    radiation treatment  . Chest Pain    HPI [name] is a 71 y.o. female.  HPI   71 year old female with past medical history as below here with chest pain and pain with swallowing. The patient recently began radiation treatment for breast cancer. She has undergone 2 treatments. She states that after the first treatment, she has since had constant, aching, burning pain in her bilateral breasts and chest. She is also noticed pain with swallowing and an internal tightening and tearing sensation. The symptoms correlate directly with the onset of radiation and she has never had similar symptoms in the past. They do seem to worsen when lying flat as well as when eating foods. She has had difficulty controlling the pain and had tried her oxycodone pills but states they're too strong for her. She presents today due to ongoing and persistent pain. Denies any fevers or chills. Denies any nausea, vomiting, or diarrhea. Denies any dyspnea.  Past Medical History:  Diagnosis Date  . Arthritis   . Breast cancer ([place])    right  . Family history of breast cancer   . GERD (gastroesophageal reflux disease)   . Hypertension   . Shortness of breath   . Sleep apnea   . Stroke [facility])     Patient Active Problem List   Diagnosis Date Noted  . Genetic testing 01/09/2017  . Family history of breast cancer   . Port catheter in place 07/29/2016  . Anemia in neoplastic disease 01/05/2016  . 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 Difficulty walking 12/09/2015  . Swelling of both hands 12/09/2015  . Need for pneumococcal vaccine 10/18/2015  . OSA (obstructive sleep apnea) 07/14/2014  . Muscle spasm 05/08/2014  . Blurry vision, bilateral 04/22/2014  . Healthcare  maintenance 04/22/2014  . HLD (hyperlipidemia) 04/08/2014  . History of stroke 02/03/2014  . Knee pain, bilateral 03/04/2012  . Cardiomegaly 03/04/2012  . Obesity (BMI 30-39.9) 03/04/2012  . CKD (chronic kidney disease) stage 3, GFR 30-59 ml/min 03/04/2012  . Sleep disturbance 03/04/2012  . Hemorrhoids 07/03/2006  . TOBACCO ABUSE, HX OF 07/03/2006  . Essential hypertension 04/30/2006  . GERD 04/30/2006  . LOW BACK PAIN 04/30/2006  . Hx of alcohol use 04/30/2006    Past Surgical History:  Procedure Laterality Date  . IR CV LINE INJECTION  11/28/2016  . IR GENERIC HISTORICAL  06/13/2016   IR US GUIDE VASC ACCESS LEFT 06/13/2016 Corrie Mckusick, DO [facility]-INTERV [facility]  . IR GENERIC HISTORICAL  06/13/2016   IR FLUORO GUIDE PORT INSERTION LEFT 06/13/2016 Corrie Mckusick, DO [facility]-INTER[facility]  . NO PAST SURGERIES      OB History    No data available       Home Medications    Prior to Admission medications   Medication Sig Start Date End Date Taking? Authorizing Provider  amLODipine (NORVASC) 10 MG tablet Take 1 tablet (10 mg total) by mouth daily. 09/25/16   [name], MD  aspirin EC 81 MG tablet Take 1 tablet (81 mg total) by mouth daily. 04/27/14   Rosalin Hawking, MD  atorvastatin (LIPITOR) 20 MG tablet TAKE 1 TABLET EVERY DAY Patient taking differently: Take 20 mg by mouth daily 05/28/16   Daryll Drown,  Peri Jefferson, MD  lidocaine-prilocaine (EMLA) cream Apply to port @ 1 hour prior to treatment. 06/19/16   Truitt Merle, MD  metoprolol tartrate (LOPRESSOR) 25 MG tablet TAKE 1 TABLET EVERY DAY 07/09/16   [name], MD  non-metallic deodorant Jethro Poling) MISC Apply 1 application topically daily as needed.    [provider]  ondansetron (ZOFRAN) 8 MG tablet Take by mouth every 8 (eight) hours as needed for nausea or vomiting.    [provider]  oxyCODONE (ROXICODONE) 5 MG/5ML solution Take 2.5-5 mLs (2.5-5 mg total) by mouth every 4 (four) hours as needed for moderate pain or severe pain.  01/20/17   Duffy Bruce, MD  prochlorperazine (COMPAZINE) 10 MG tablet Take 10 mg by mouth every 6 (six) hours as needed for nausea or vomiting.    [provider]  ranitidine (ZANTAC) 150 MG capsule Take 1 capsule (150 mg total) by mouth daily. 01/20/17 01/30/17  Duffy Bruce, MD  sucralfate (CARAFATE) 1 GM/10ML suspension Take 10 mLs (1 g total) by mouth 4 (four) times daily -  with meals and at bedtime. 01/20/17   Duffy Bruce, MD  traMADol (ULTRAM) 50 MG tablet Take 1 tablet (50 mg total) by mouth every 8 (eight) hours as needed. 12/30/16   Gery Pray, MD  Wound Cleansers (RADIAPLEX EX) Apply topically.    [provider]    Family History Family History  Problem Relation Age of Onset  . Heart disease Mother   . Cancer Father 14       throat cancer  . Breast cancer Cousin        paternal first cousin dx in her late 66s-early 60s  . Cancer Sister        "cancer down below"    Social History Social History  Substance Use Topics  . Smoking status: Former Smoker    Packs/day: 0.50    Years: 1.00    Types: Cigarettes    Start date: 06/17/1968    Quit date: 06/17/1973  . Smokeless tobacco: Never Used     Comment: Quit in 73s  . Alcohol use No     Comment: former drinker for approximately 10 years     Allergies   Patient has no known allergies.   Review of Systems Review of Systems  Constitutional: Positive for fatigue. Negative for chills and fever.  HENT: Negative for congestion and rhinorrhea.   Eyes: Negative for visual disturbance.  Respiratory: Positive for chest tightness. Negative for cough, shortness of breath and wheezing.   Cardiovascular: Negative for chest pain and leg swelling.  Gastrointestinal: Positive for abdominal pain and nausea. Negative for diarrhea and vomiting.  Genitourinary: Negative for dysuria and flank pain.  Musculoskeletal: Negative for neck pain and neck stiffness.  Skin: Positive for rash. Negative for wound.    Allergic/Immunologic: Negative for immunocompromised state.  Neurological: Negative for syncope, weakness and headaches.  All other systems reviewed and are negative.    Physical Exam Updated Vital Signs BP 137/61 (BP Location: Left Arm)   Pulse 80   Temp 97.9 F (36.6 C) (Oral)   Resp (!) 22   SpO2 100%   Physical Exam  Constitutional: She is oriented to person, place, and time. She appears well-developed and well-nourished. No distress.  HENT:  Head: Normocephalic and atraumatic.  Eyes: Conjunctivae are normal.  Neck: Neck supple.  Cardiovascular: Normal rate, regular rhythm and normal heart sounds.  Exam reveals no friction rub.   No murmur heard. Pulmonary/Chest:  Effort normal and breath sounds normal. No respiratory distress. She has no wheezes. She has no rales. She exhibits tenderness (Radiation changes with skin erythema and thickening to the bilateral breasts, with significant tenderness to palpation. No purulence or expressible drainage.).  Abdominal: Soft. Bowel sounds are normal. She exhibits no distension. There is no tenderness. There is no rebound and no guarding.  Musculoskeletal: She exhibits no edema.  Neurological: She is alert and oriented to person, place, and time. She exhibits normal muscle tone.  Skin: Skin is warm. Capillary refill takes less than 2 seconds.  Psychiatric: She has a normal mood and affect.  Nursing note and vitals reviewed.    ED Treatments / Results  Labs (all labs ordered are listed, but only abnormal results are displayed) Labs Reviewed  BASIC METABOLIC PANEL - Abnormal; Notable for the following:       Result Value   BUN 31 (*)    Creatinine, Ser 1.63 (*)    GFR calc non Af Amer 31 (*)    GFR calc Af Amer 36 (*)    All other components within normal limits  CBC - Abnormal; Notable for the following:    RBC 3.60 (*)    Hemoglobin 9.0 (*)    HCT 29.0 (*)    MCH 25.0 (*)    RDW 17.7 (*)    All other components within normal  limits  I-STAT TROPONIN, ED  POCT I-STAT TROPONIN I    EKG  EKG Interpretation  Date/Time:  Monday January 20 2017 16:24:44 EDT Ventricular Rate:  81 PR Interval:    QRS Duration: 99 QT Interval:  388 QTC Calculation: 451 R Axis:   -20 Text Interpretation:  Accelerated junctional rhythm Borderline left axis deviation Low voltage, precordial leads Since last EKG, voltage is decreased Otherwise no significant change Confirmed by Duffy Bruce [phone]) on 01/20/2017 7:50:10 PM       Radiology Dg Chest 2 View  Result Date: 01/20/2017 CLINICAL DATA:  Generalized body aches and nausea for 1 week, began radiation therapy 1 week ago, chest burning and tightness, hypertension, RIGHT breast cancer EXAM: CHEST  2 VIEW COMPARISON:  11/28/2015; PET-CT 11/27/2016 FINDINGS: LEFT jugular Port-A-Cath with tip projecting over SVC. Mild enlargement of cardiac silhouette. Tortuous thoracic aorta. Mediastinal contours and pulmonary vascularity otherwise normal. Lungs clear. No pleural effusion or pneumothorax. Bones demineralized. IMPRESSION: No acute abnormalities. Electronically Signed   By: Lavonia Dana M.D.   On: 01/20/2017 17:22    Procedures Procedures (including critical care time)  Medications Ordered in ED Medications  oxyCODONE (ROXICODONE) 5 MG/5ML solution 2.5 mg (2.5 mg Oral Given 01/20/17 2000)  gi cocktail (Maalox,Lidocaine,Donnatal) (30 mLs Oral Given 01/20/17 2001)     Initial Impression / Assessment and Plan / ED Course  I have reviewed the triage vital signs and the nursing notes.  Pertinent labs & imaging results that were available during my care of the patient were reviewed by me and considered in my medical decision making (see chart for details).     71 year old female here with chest wall and epigastric/swallowing pain following radiation treatment to her bilateral breasts. I suspect she has radiation skin changes with pain as well as likely radiation esophagitis. Her chest  x-ray today is clear. She has a normal white count and evidence to suggest significant superimposed infection. Renal function shows mild dehydration but creatinine is near its baseline. She is tolerating by mouth without difficulty here in ED. Otherwise, EKG is nonischemic and troponin  is negative and I do not suspect ischemic disease, PE, or dissection. She was given a lower dose of oxycodone here as well as GI cocktail with marked improvement in her pain. She is very well-appearing following pain control. Will discharge with oxycodone elixir to allow her to titrate to effect, as well as Carafate and Zantac.  This note was prepared with assistance of Systems analyst. Occasional wrong-word or sound-a-like substitutions may have occurred due to the inherent limitations of voice recognition software.   Final Clinical Impressions(s) / ED Diagnoses   Final diagnoses:  Radiation esophagitis  Pain after radiation therapy    New Prescriptions New Prescriptions   OXYCODONE (ROXICODONE) 5 MG/5ML SOLUTION    Take 2.5-5 mLs (2.5-5 mg total) by mouth every 4 (four) hours as needed for moderate pain or severe pain.   RANITIDINE (ZANTAC) 150 MG CAPSULE    Take 1 capsule (150 mg total) by mouth daily.   SUCRALFATE (CARAFATE) 1 GM/10ML SUSPENSION    Take 10 mLs (1 g total) by mouth 4 (four) times daily -  with meals and at bedtime.     Duffy Bruce, MD 01/20/17 [phone]

## 2017-01-20 NOTE — ED Triage Notes (Signed)
Pt c/o generalized body aches, nausea x 1 week onset after radiation treatment 1 week ago. Pt reports chest burning, tightness. Skin on chest is red, irritated, edematous. Pt reports chest tightness is both on skin and deep in chest. Last radiation 1 week ago. Recent chemo, unsure of exact date.

## 2017-01-21 ENCOUNTER — Telehealth: Payer: Self-pay | Admitting: Oncology

## 2017-01-21 ENCOUNTER — Ambulatory Visit: Payer: Medicare HMO

## 2017-01-21 NOTE — Telephone Encounter (Signed)
Called patient regarding her missed radiation appointments.  The voice mail on her phone is not set up.  There was no answer on her spouse's phone who is her emergency contact.

## 2017-01-22 ENCOUNTER — Ambulatory Visit: Payer: Medicare HMO

## 2017-01-22 ENCOUNTER — Telehealth: Payer: Self-pay | Admitting: Oncology

## 2017-01-22 NOTE — Telephone Encounter (Addendum)
Called patient to check on her since she has not come in for radiation.  She said that she wants to stop radiation because the 2 treatments she had "burnt her up" and she had to go the ER for the pain.  She "has been in the bed since."  Asked if she could come in to see Dr. Sondra Come tomorrow to discuss radiation.  She said she could come and an appointment was scheduled for 11:45.

## 2017-01-23 ENCOUNTER — Encounter: Payer: Self-pay | Admitting: Radiation Oncology

## 2017-01-23 ENCOUNTER — Ambulatory Visit: Payer: Medicare HMO

## 2017-01-23 ENCOUNTER — Ambulatory Visit
Admission: RE | Admit: 2017-01-23 | Discharge: 2017-01-23 | Disposition: A | Discharge status: Home / Self Care | Payer: Medicare HMO | Source: Ambulatory Visit | Attending: Radiation Oncology | Admitting: Radiation Oncology

## 2017-01-23 ENCOUNTER — Telehealth: Payer: Self-pay | Admitting: Radiation Oncology

## 2017-01-23 VITALS — BP 142/69 | HR 85 | Temp 97.7°F | Resp 20 | Wt 176.2 lb

## 2017-01-23 DIAGNOSIS — C50911 Malignant neoplasm of unspecified site of right female breast: Secondary | ICD-10-CM

## 2017-01-23 MED ORDER — OXYCODONE-ACETAMINOPHEN 5-325 MG PO TABS: 1.0000 | ORAL_TABLET | ORAL | 0 refills | Status: DC | PRN

## 2017-01-23 MED ORDER — DOXYCYCLINE MONOHYDRATE 100 MG PO TABS: 100.0000 mg | ORAL_TABLET | Freq: Two times a day (BID) | ORAL | 0 refills | Status: DC

## 2017-01-23 MED FILL — DOXYCYCLINE MONO 100 MG CAP: 100 | 10 days supply | Qty: 20 | Fill #0

## 2017-01-23 MED FILL — OXYCODONE-ACETAMINOPHEN 5-3: 5-325 | 5 days supply | Qty: 30 | Fill #0

## 2017-01-23 NOTE — Progress Notes (Addendum)
[name] is here today for a follow-up for her right breast.States that her breast is hurting a 10/10.States that when she takes the pain medication she gets some relief but not much.States that she is feeling some fatigue.States that she is using radioplex. States that she is using Carafate ,to help with her esophagus. Her skin is hyperpigmented.

## 2017-01-23 NOTE — Telephone Encounter (Signed)
Unable to leave voicemail in reference to 1:30 appointment 8/16

## 2017-01-23 NOTE — Progress Notes (Signed)
Radiation Oncology         [phone] ________________________________  Name: [name] MRN: [id number]  Date: 01/23/2017  DOB: May 26, 1946  Follow-Up Visit Note/under treatment note  CC: [name], MD  [name], MD    ICD-10-CM   1. 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   Interval Since Last Radiation:  8 days The patient received 3.6 Gy in 2 fractions directed at both breast areas. She stopped her radiation treatments after 2 sessions  Narrative:  The patient returns today for routine follow-up accompanied by her husband. Pt reports that she has had skin changes (redness) following radiation therapy. Pt reports mild breathing issues. She reports bilateral breast pain, right > left. Pt reports that she voluntarily discontinued radiation therapy after two treatments due to increased breast pain which she contributed to radiation therapy. Pt states that she was evaluated in the ED for her breast pain and esophageal pain on 01/20/17. Pt was given a Rx of liquid oxycodone and carafet following her ED visit. Discussed with pt and her husband that she is not receiving radiation therapy to her esophagus.  On review of systems, pt denies fever, chills. Pt reports redness to her chest wall. Pt reports increased breast pain, right > left. Pt reports esophageal pain. Pt reports mild breathing issues.                              ALLERGIES:  has No Known Allergies.  Meds: Current Outpatient Prescriptions  Medication Sig Dispense Refill  . amLODipine (NORVASC) 10 MG tablet Take 1 tablet (10 mg total) by mouth daily. 90 tablet 3  . aspirin EC 81 MG tablet Take 1 tablet (81 mg total) by mouth daily. 90 tablet 3  . atorvastatin (LIPITOR) 20 MG tablet TAKE 1 TABLET EVERY DAY (Patient taking differently: Take 20 mg by mouth daily) 90 tablet 3  .  lidocaine-prilocaine (EMLA) cream Apply to port @ 1 hour prior to treatment. 30 g 1  . metoprolol tartrate (LOPRESSOR) 25 MG tablet TAKE 1 TABLET EVERY DAY 90 tablet 3  . ondansetron (ZOFRAN) 8 MG tablet Take by mouth every 8 (eight) hours as needed for nausea or vomiting.    Marland Kitchen oxyCODONE (ROXICODONE) 5 MG/5ML solution Take 2.5-5 mLs (2.5-5 mg total) by mouth every 4 (four) hours as needed for moderate pain or severe pain. 120 mL 0  . prochlorperazine (COMPAZINE) 10 MG tablet Take 10 mg by mouth every 6 (six) hours as needed for nausea or vomiting.    . ranitidine (ZANTAC) 150 MG capsule Take 1 capsule (150 mg total) by mouth daily. 10 capsule 0  . sucralfate (CARAFATE) 1 GM/10ML suspension Take 10 mLs (1 g total) by mouth 4 (four) times daily -  with meals and at bedtime. 420 mL 0  . traMADol (ULTRAM) 50 MG tablet Take 1 tablet (50 mg total) by mouth every 8 (eight) hours as needed. 60 tablet 1  . Wound Cleansers (RADIAPLEX EX) Apply topically.    Marland Kitchen doxycycline (ADOXA) 100 MG tablet Take 1 tablet (100 mg total) by mouth 2 (two) times daily. 20 tablet 0  . non-metallic deodorant (ALRA) MISC Apply 1 application topically daily as needed.    Marland Kitchen oxyCODONE-acetaminophen (PERCOCET/ROXICET) 5-325 MG tablet Take 1 tablet by mouth  every 4 (four) hours as needed for severe pain. 30 tablet 0   No current facility-administered medications for this encounter.    Facility-Administered Medications Ordered in Other Encounters  Medication Dose Route Frequency Provider Last Rate Last Dose  . sodium chloride flush (NS) 0.9 % injection 10 mL  10 mL Intravenous PRN Truitt Merle, MD   10 mL at 08/12/16 1754  . sodium chloride flush (NS) 0.9 % injection 10 mL  10 mL Intravenous PRN Truitt Merle, MD   10 mL at 11/18/16 1516   REVIEW OF SYSTEMS: A 10+ POINT REVIEW OF SYSTEMS WAS OBTAINED including neurology, dermatology, psychiatry, cardiac, respiratory, lymph, extremities, GI, GU, musculoskeletal, constitutional, reproductive,  HEENT. All pertinent positives are noted in the HPI. All others are negative.  Physical Findings: The patient is in no acute distress. Patient is alert and oriented.  weight is 176 lb 4 oz (79.9 kg). Her oral temperature is 97.7 F (36.5 C). Her blood pressure is 142/69 (abnormal) and her pulse is 85. Her respiration is 20 and oxygen saturation is 99%. .  No significant changes. Lungs are clear to auscultation bilaterally. Heart has regular rate and rhythm. No palpable cervical, supraclavicular, or axillary adenopathy. Abdomen soft, non-tender, normal bowel sounds. Examination of the breast area reveals diffuse infiltration of both breasts with cancer and skin involvement. Pt appears to have more erythema than on last exam, concerning for cellulitis.    Lab Findings: Lab Results  Component Value Date   WBC 4.2 01/20/2017   HGB 9.0 (L) 01/20/2017   HCT 29.0 (L) 01/20/2017   MCV 80.6 01/20/2017   PLT 310 01/20/2017    Radiographic Findings: Dg Chest 2 View  Result Date: 01/20/2017 CLINICAL DATA:  Generalized body aches and nausea for 1 week, began radiation therapy 1 week ago, chest burning and tightness, hypertension, RIGHT breast cancer EXAM: CHEST  2 VIEW COMPARISON:  11/28/2015; PET-CT 11/27/2016 FINDINGS: LEFT jugular Port-A-Cath with tip projecting over SVC. Mild enlargement of cardiac silhouette. Tortuous thoracic aorta. Mediastinal contours and pulmonary vascularity otherwise normal. Lungs clear. No pleural effusion or pneumothorax. Bones demineralized. IMPRESSION: No acute abnormalities. Electronically Signed   By: Lavonia Dana M.D.   On: 01/20/2017 17:22    Impression:   Pt likely has cellulitis on top of diffuse tumor infiltration of both breasts. She will be placed on a10 day course of doxycyline. Pt will return in 7 days for further evaluation. At this point, she does not wish to resume radiation therapy, but will reassess after she completes her abx therapy. Pt was given percocet  (pill) Rx to use when her liquid oxycodone runs out.  Plan:  Pt will follow up with Radiation Oncology in 7 days.   ____________________________________ -----------------------------------  Blair Promise, PhD, MD   This document serves as a record of services personally performed by Gery Pray, MD. It was created on her behalf by Steva Colder, a trained medical scribe. The creation of this record is based on the scribe's personal observations and the provider's statements to them. This document has been checked and approved by the attending provider.

## 2017-01-24 ENCOUNTER — Ambulatory Visit: Payer: Medicare HMO

## 2017-01-27 ENCOUNTER — Encounter: Payer: Self-pay | Admitting: Pharmacist

## 2017-01-27 ENCOUNTER — Ambulatory Visit: Payer: Medicare HMO

## 2017-01-27 DIAGNOSIS — C50911 Malignant neoplasm of unspecified site of right female breast: Secondary | ICD-10-CM

## 2017-01-27 NOTE — Progress Notes (Signed)
Telephone documentation  Studycode:[id number]  Attempted third and final call patient and review with her the pharmacogenetic information that was obtained for the "Pharmacogenetic analysis of toxicities related to administration of taxanes in breast cancer patients" study. Unable to reach patient and unable to leave voicemail.   Below are the results of the buccal swab.   **The buccal swab testing was NOT conducted at a CLIA validated lab. The patient was reminded that the information given was for informational proposes only and should NOT be used to make clinical decisions.   Gene Phenotype  CYP3A4 Normal metabolizer   CYP3A5 Normal metabolizer   SLCO1B1 Normal function  ABCB1 Low function   Darl Pikes, PharmD, BCPS Hematology/Oncology Clinical Pharmacist [facility] [phone]  01/27/2017 3:52 PM

## 2017-01-28 ENCOUNTER — Ambulatory Visit: Payer: Medicare HMO

## 2017-01-29 ENCOUNTER — Ambulatory Visit: Admission: RE | Admit: 2017-01-29 | Payer: Medicare HMO | Source: Ambulatory Visit

## 2017-01-30 ENCOUNTER — Ambulatory Visit: Admission: RE | Admit: 2017-01-30 | Payer: Medicare HMO | Source: Ambulatory Visit

## 2017-01-30 ENCOUNTER — Ambulatory Visit
Admission: RE | Admit: 2017-01-30 | Discharge: 2017-01-30 | Disposition: A | Discharge status: Home / Self Care | Payer: Medicare HMO | Source: Ambulatory Visit | Attending: Radiation Oncology | Admitting: Radiation Oncology

## 2017-01-30 ENCOUNTER — Other Ambulatory Visit (HOSPITAL_BASED_OUTPATIENT_CLINIC_OR_DEPARTMENT_OTHER): Payer: Medicare HMO

## 2017-01-30 ENCOUNTER — Ambulatory Visit (HOSPITAL_BASED_OUTPATIENT_CLINIC_OR_DEPARTMENT_OTHER): Payer: Medicare HMO | Admitting: Hematology

## 2017-01-30 ENCOUNTER — Encounter: Payer: Self-pay | Admitting: Hematology

## 2017-01-30 ENCOUNTER — Telehealth: Payer: Self-pay | Admitting: Hematology

## 2017-01-30 VITALS — BP 140/69 | HR 83 | Temp 98.1°F | Resp 20 | Ht 64.0 in | Wt 117.5 lb

## 2017-01-30 VITALS — BP 135/65 | HR 81 | Temp 98.4°F | Resp 20 | Wt 176.0 lb

## 2017-01-30 DIAGNOSIS — C773 Secondary and unspecified malignant neoplasm of axilla and upper limb lymph nodes: Secondary | ICD-10-CM | POA: Diagnosis not present

## 2017-01-30 DIAGNOSIS — Z510 Encounter for antineoplastic radiation therapy: Secondary | ICD-10-CM | POA: Diagnosis not present

## 2017-01-30 DIAGNOSIS — D6959 Other secondary thrombocytopenia: Secondary | ICD-10-CM

## 2017-01-30 DIAGNOSIS — Z9221 Personal history of antineoplastic chemotherapy: Secondary | ICD-10-CM | POA: Diagnosis not present

## 2017-01-30 DIAGNOSIS — C50911 Malignant neoplasm of unspecified site of right female breast: Secondary | ICD-10-CM

## 2017-01-30 DIAGNOSIS — N183 Chronic kidney disease, stage 3 unspecified: Secondary | ICD-10-CM

## 2017-01-30 DIAGNOSIS — Z171 Estrogen receptor negative status [ER-]: Secondary | ICD-10-CM

## 2017-01-30 DIAGNOSIS — G4733 Obstructive sleep apnea (adult) (pediatric): Secondary | ICD-10-CM

## 2017-01-30 DIAGNOSIS — D701 Agranulocytosis secondary to cancer chemotherapy: Secondary | ICD-10-CM | POA: Diagnosis not present

## 2017-01-30 DIAGNOSIS — C7981 Secondary malignant neoplasm of breast: Secondary | ICD-10-CM | POA: Diagnosis not present

## 2017-01-30 DIAGNOSIS — Z79899 Other long term (current) drug therapy: Secondary | ICD-10-CM | POA: Diagnosis not present

## 2017-01-30 DIAGNOSIS — N644 Mastodynia: Secondary | ICD-10-CM | POA: Diagnosis not present

## 2017-01-30 DIAGNOSIS — E669 Obesity, unspecified: Secondary | ICD-10-CM

## 2017-01-30 DIAGNOSIS — Z7982 Long term (current) use of aspirin: Secondary | ICD-10-CM | POA: Diagnosis not present

## 2017-01-30 DIAGNOSIS — D630 Anemia in neoplastic disease: Secondary | ICD-10-CM

## 2017-01-30 DIAGNOSIS — I10 Essential (primary) hypertension: Secondary | ICD-10-CM

## 2017-01-30 LAB — COMPREHENSIVE METABOLIC PANEL
ALBUMIN: 3.4 g/dL — AB (ref 3.5–5.0)
ALK PHOS: 63 U/L (ref 40–150)
ALT: 9 U/L (ref 0–55)
AST: 20 U/L (ref 5–34)
Anion Gap: 10 mEq/L (ref 3–11)
BILIRUBIN TOTAL: 0.39 mg/dL (ref 0.20–1.20)
BUN: 34.9 mg/dL — ABNORMAL HIGH (ref 7.0–26.0)
CALCIUM: 9.3 mg/dL (ref 8.4–10.4)
CO2: 25 mEq/L (ref 22–29)
Chloride: 105 mEq/L (ref 98–109)
Creatinine: 1.9 mg/dL — ABNORMAL HIGH (ref 0.6–1.1)
EGFR: 30 mL/min/{1.73_m2} — AB (ref >90)
Glucose: 90 mg/dl (ref 70–140)
POTASSIUM: 4.8 meq/L (ref 3.5–5.1)
Sodium: 139 mEq/L (ref 136–145)
TOTAL PROTEIN: 6.9 g/dL (ref 6.4–8.3)

## 2017-01-30 LAB — CBC WITH DIFFERENTIAL/PLATELET
BASO%: 1.5 % (ref 0.0–2.0)
BASOS ABS: 0.1 10*3/uL (ref 0.0–0.1)
EOS ABS: 0.1 10*3/uL (ref 0.0–0.5)
EOS%: 1.6 % (ref 0.0–7.0)
HEMATOCRIT: 27.1 % — AB (ref 34.8–46.6)
HEMOGLOBIN: 8.5 g/dL — AB (ref 11.6–15.9)
LYMPH#: 0.3 10*3/uL — AB (ref 0.9–3.3)
LYMPH%: 6.3 % — ABNORMAL LOW (ref 14.0–49.7)
MCH: 24.4 pg — AB (ref 25.1–34.0)
MCHC: 31.2 g/dL — ABNORMAL LOW (ref 31.5–36.0)
MCV: 78.4 fL — AB (ref 79.5–101.0)
MONO#: 0.7 10*3/uL (ref 0.1–0.9)
MONO%: 16.2 % — ABNORMAL HIGH (ref 0.0–14.0)
NEUT%: 74.4 % (ref 38.4–76.8)
NEUTROS ABS: 3 10*3/uL (ref 1.5–6.5)
Platelets: 263 10*3/uL (ref 145–400)
RBC: 3.46 10*6/uL — ABNORMAL LOW (ref 3.70–5.45)
RDW: 19.6 % — AB (ref 11.2–14.5)
WBC: 4.1 10*3/uL (ref 3.9–10.3)

## 2017-01-30 MED ORDER — MIRTAZAPINE 15 MG PO TABS: 15.0000 mg | ORAL_TABLET | Freq: Every day | ORAL | 2 refills | Status: AC

## 2017-01-30 MED ORDER — LACTULOSE 10 GM/15ML PO SOLN: 10.0000 g | Freq: Three times a day (TID) | ORAL | 0 refills | Status: DC | PRN

## 2017-01-30 MED ORDER — OXYCODONE HCL ER 10 MG PO T12A: 10.0000 mg | EXTENDED_RELEASE_TABLET | Freq: Two times a day (BID) | ORAL | 0 refills | Status: DC

## 2017-01-30 MED FILL — MIRTAZAPINE 15 MG TABLET: 15 | 30 days supply | Qty: 30 | Fill #0

## 2017-01-30 MED FILL — LACTULOSE 10 GM/15 ML SOLN: 10 | 2 days supply | Qty: 240 | Fill #0

## 2017-01-30 NOTE — Progress Notes (Addendum)
[name] is here today for a follow up and discussion on how to proceed with radiation.  Patient reports pain 10/10 to chest and back and states she takes pain medication and some times it helps and others it does not.. Patient reports that she is unable to let anything touch it.  The skin appears swollen, inflammed, red, and warm to touch.  Patients energy level is depleted.  Patients appetite is decreased. Patient states she is using holy oil (olive oil) that has been blessed at her church.    Vitals:   01/30/17 1335  BP: 135/65  Pulse: 81  Resp: 20  Temp: 98.4 F (36.9 C)  TempSrc: Oral  SpO2: 99%  Weight: 176 lb (79.8 kg)   Wt Readings from Last 3 Encounters:  01/30/17 176 lb (79.8 kg)  01/23/17 176 lb 4 oz (79.9 kg)  01/10/17 174 lb 1.6 oz (79 kg)

## 2017-01-30 NOTE — Telephone Encounter (Signed)
Gave pt avs and calendar for all of her remaining appts in August.

## 2017-01-30 NOTE — Progress Notes (Signed)
Radiation Oncology         [phone] ________________________________  Name: [name] MRN: [id number]  Date: 01/30/2017  DOB: 1945/12/22    Follow-Up Visit Note  CC: Brandy Falcon, MD  Brandy Merle, MD    ICD-10-CM   1. 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    Interval Since Last Radiation:  2 weeks ; The Right breast and Left breast were treated to 3.6 Gy in 2 fractions, 01/14/2017 to 01/15/2017  Narrative:  The patient returns today for follow-up and discussion on how to proceed with radiation.  The patient voluntarily discontinued radiation therapy after two treatments due to increased breast pain which she attributed to radiation therapy. Patient reports pain 10/10 to chest and back and states she takes pain medication and sometimes it helps and others it does not. States pain relief from medication does not last more than three hours. Patient reports that she is unable to let anything touch it. States pain is worse on the right than the left. The skin appears swollen, inflammed, red, and warm to touch. Patient's energy level is depleted. Patient's appetite is decreased. She has two more days of antibiotic left.                               ALLERGIES:  has No Known Allergies.  Meds: Current Outpatient Prescriptions  Medication Sig Dispense Refill  . amLODipine (NORVASC) 10 MG tablet Take 1 tablet (10 mg total) by mouth daily. 90 tablet 3  . aspirin EC 81 MG tablet Take 1 tablet (81 mg total) by mouth daily. 90 tablet 3  . atorvastatin (LIPITOR) 20 MG tablet TAKE 1 TABLET EVERY DAY (Patient taking differently: Take 20 mg by mouth daily) 90 tablet 3  . doxycycline (ADOXA) 100 MG tablet Take 1 tablet (100 mg total) by mouth 2 (two) times daily. 20 tablet 0  . lidocaine-prilocaine (EMLA) cream Apply to port @ 1 hour prior to treatment. 30 g  1  . metoprolol tartrate (LOPRESSOR) 25 MG tablet TAKE 1 TABLET EVERY DAY 90 tablet 3  . non-metallic deodorant (ALRA) MISC Apply 1 application topically daily as needed.    . ondansetron (ZOFRAN) 8 MG tablet Take by mouth every 8 (eight) hours as needed for nausea or vomiting.    Marland Kitchen oxyCODONE (ROXICODONE) 5 MG/5ML solution Take 2.5-5 mLs (2.5-5 mg total) by mouth every 4 (four) hours as needed for moderate pain or severe pain. 120 mL 0  . oxyCODONE-acetaminophen (PERCOCET/ROXICET) 5-325 MG tablet Take 1 tablet by mouth every 4 (four) hours as needed for severe pain. 30 tablet 0  . prochlorperazine (COMPAZINE) 10 MG tablet Take 10 mg by mouth every 6 (six) hours as needed for nausea or vomiting.    . ranitidine (ZANTAC) 150 MG capsule Take 1 capsule (150 mg total) by mouth daily. 10 capsule 0  . sucralfate (CARAFATE) 1 GM/10ML suspension Take 10 mLs (1 g total) by mouth 4 (four) times daily -  with meals and at bedtime. 420 mL 0  . traMADol (ULTRAM) 50 MG tablet Take 1 tablet (50 mg total) by mouth every 8 (eight) hours as needed. 60 tablet 1  . Wound Cleansers (RADIAPLEX EX) Apply topically.     No current facility-administered medications for this encounter.  Facility-Administered Medications Ordered in Other Encounters  Medication Dose Route Frequency Provider Last Rate Last Dose  . sodium chloride flush (NS) 0.9 % injection 10 mL  10 mL Intravenous PRN Brandy Merle, MD   10 mL at 08/12/16 1754  . sodium chloride flush (NS) 0.9 % injection 10 mL  10 mL Intravenous PRN Brandy Merle, MD   10 mL at 11/18/16 1516    Physical Findings: The patient is in no acute distress. Patient is alert and oriented.  weight is 176 lb (79.8 kg). Her oral temperature is 98.4 F (36.9 C). Her blood pressure is 135/65 and her pulse is 81. Her respiration is 20 and oxygen saturation is 99%. .   Presents in wheelchair. Breast exam shows chest / breast area is unchanged. Areas of erythema consistent with tumor  infiltration of the cutaneous tissues. Both breasts are very tight and very indurated, consistent with tumor infiltration throughout.   Lab Findings: Lab Results  Component Value Date   WBC 4.2 01/20/2017   HGB 9.0 (L) 01/20/2017   HCT 29.0 (L) 01/20/2017   MCV 80.6 01/20/2017   PLT 310 01/20/2017    Radiographic Findings: Dg Chest 2 View  Result Date: 01/20/2017 CLINICAL DATA:  Generalized body aches and nausea for 1 week, began radiation therapy 1 week ago, chest burning and tightness, hypertension, RIGHT breast cancer EXAM: CHEST  2 VIEW COMPARISON:  11/28/2015; PET-CT 11/27/2016 FINDINGS: LEFT jugular Port-A-Cath with tip projecting over SVC. Mild enlargement of cardiac silhouette. Tortuous thoracic aorta. Mediastinal contours and pulmonary vascularity otherwise normal. Lungs clear. No pleural effusion or pneumothorax. Bones demineralized. IMPRESSION: No acute abnormalities. Electronically Signed   By: Lavonia Dana M.D.   On: 01/20/2017 17:22    Impression: Patient is still somewhat hesitant to starting radiation but since it is her only reasonable option she will likely proceed with restarting treatment on Monday. She has been on Keytruda, this has not seemed to be helping her. The doxycycline prescription did not seem to improve the situation so the patient likely did not have cellulitis. She may need a long acting narcotic in light of her progressive pain.  Plan:  Proceed with restarting radiation therapy on Monday if she is agreeable. We will continue to monitor the patient's symptoms closely. She will meet with Dr. [name] later today.  -----------------------------------  Blair Promise, PhD, MD  This document serves as a record of services personally performed by Gery Pray, MD. It was created on his behalf by Arlyce Harman, a trained medical scribe. The creation of this record is based on the scribe's personal observations and the provider's statements to them. This document  has been checked and approved by the attending provider.

## 2017-01-30 NOTE — Progress Notes (Signed)
[place]  [phone] [phone]  Clinic Follow Up Note   Patient Care Team: [name], MD as PCP - General (Internal Medicine) 01/30/2017   CHIEF COMPLAINTS:  Follow up right breast cancer   Oncology History   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 [facility])   Staging form: Breast, AJCC 7th Edition   - Clinical stage from 11/30/2015: Stage IV (T4d, N3c, M1) - Signed by Truitt Merle, MD on 01/05/2016      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 [facility])   11/30/2015 Initial Diagnosis    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 [facility])      11/30/2015 Initial Biopsy    Right axilla lymph node biopsy showed metastatic poorly differentiated adenocarcinoma, IHC positive for CK AE1/AE3, CK7, E-cadherin, CDX-2, and focally very weak to equivocal staining for ER      12/21/2015 Imaging    Bilateral breast MRI showed extensive non-mass enhancement involving the entire right breast, right axillary, supraclavicular, and internal mammary adenopathy, new left axillary adenopathy      12/27/2015 Mammogram    Diagnostic mammogram and ultrasound of right breast and axilla showed diffuse skin thickening and increased density, no focal mass, numerous enlarged right axillary lymph node, the largest measuring 5.3cm.       12/27/2015 Initial Biopsy    Right breast core needle biopsy showed invasive ductal carcinoma, and DCIS, lymphovascular invasion identified      12/27/2015 Receptors her2    ER negative, PR negative, Ki-67 70%, HER-2 negative      01/03/2016 Imaging    PET scan showed diffuse asymmetric right breast hypermetabolic soft tissue density and skin thickening, hypermetabolic metastatic lymphadenopathy in bilateral axillary and subpectoral regions, right supraclavicular region and the right cervical level 5      01/10/2016 - 05/28/2016 Chemotherapy    Weekly Taxol 80 mg/m, second cycle was  postponed to 8/22 due to pt's non-compliance, stopped due to disease progression. She developed mild peripheral neuropathy in early Dec 2017.      02/07/2016 Pathology Results    Left axillary lymph node biopsy showed metastatic poorly differentiated carcinoma, triple negative.      04/24/2016 Imaging    CT CHEST, ABDOMEN AND PELVIS WITH CONTRAST IMPRESSION: 1. Bulky right axillary and mild right retropectoral and right supraclavicular lymphadenopathy is mildly decreased since 01/03/2016 PET-CT. 2. Left axillary lymphadenopathy is mildly increased. 3. Diffuse asymmetric thickening of the skin and fibroglandular soft tissues of the right breast is not appreciably changed. 4. No definite additional sites of metastatic disease in the chest, abdomen or pelvis. 5. Subcentimeter right liver lobe lesion is too small to characterize and is probably stable. 6. Additional findings include aortic atherosclerosis, 1 vessel coronary atherosclerosis, tiny hiatal hernia and myomatous uterus.      06/05/2016 Imaging    PET scan showed progressive right breast mass, extending in size and metabolic activity. Enlarging bilateral axillary adenopathy likewise in increased activity. A right supraclavicular lymph node is slightly smaller than before, no other new metastasis.      06/21/2016 - 08/12/2016 Chemotherapy    Adriamycin 60 mg/m, and Cytoxan 600 mg/m, every 2-3 weeks, for a total of 4 cycles.       08/23/2016 PET scan    1. Response to therapy. Significant decrease in size and hypermetabolism of right breast mass and bilateral axillary nodal metastasis. Hypermetabolism within a right supraclavicular node has resolved.  2. New soft tissue fullness and hypermetabolism within the lateral left breast, suspicious for either metastatic disease or new primary. Mastitis could look similar. 3. No extrathoracic metastatic disease identified. 4.  Coronary artery atherosclerosis. Aortic atherosclerosis. 5.  Diffuse marrow hypermetabolism is likely related to stimulation by chemotherapy. This decreases sensitivity for osseous metastasis.      09/09/2016 -  Chemotherapy    Carboplatin AUC 2, and gemcitabine 1000 mg/m, on day 1 and 8 every 21 days, dose reduced from cycle 2 due to significant neutropenia and thrombocytopenia. Neulasta added on day 9 from cycle 2 and granix added day 2 cycle 2  Due to cancer progression change treatment to Eribulin infusion 2 weeks on and 1 week starting 6/21       11/27/2016 PET scan    PET 11/27/16 IMPRESSION: 1. Interval progression of disease. 2. There is increased hypermetabolic activity within bilateral axillary and right pectoral lymph nodes which are similar in overall size to the prior study. 3. There is also extensive hypermetabolic activity throughout both breasts which is nodular and progressive, concerning for local extension or inflammatory breast cancer. 4. New multifocal hypermetabolic osseous metastatic disease. 5. No evidence of metastatic disease to the lungs or within the abdomen or pelvis.       01/07/2017 Genetic Testing    Negative genetic testing on the common hereditary cancer panel.  The Hereditary Gene Panel offered by [facility] includes sequencing and/or deletion duplication testing of the following 46 genes: APC, ATM, AXIN2, BARD1, BMPR1A, BRCA1, BRCA2, BRIP1, CDH1, CDKN2A (p14ARF), CDKN2A (p16INK4a), CHEK2, CTNNA1, DICER1, EPCAM (Deletion/duplication testing only), GREM1 (promoter region deletion/duplication testing only), KIT, MEN1, MLH1, MSH2, MSH3, MSH6, MUTYH, NBN, NF1, NHTL1, PALB2, PDGFRA, PMS2, POLD1, POLE, PTEN, RAD50, RAD51C, RAD51D, SDHB, SDHC, SDHD, SMAD4, SMARCA4. STK11, TP53, TSC1, TSC2, and VHL.  The following genes were evaluated for sequence changes only: SDHA and HOXB13 c.251G>A variant only.  The report date is January 07, 2017.        HISTORY OF PRESENTING ILLNESS (12/14/2015) :  [name] 71 y.o. female is  here because of her recently diagnosed metastatic carcinoma to right axilla. She is accompanied by her husband to the clinic today.  She noticed a lump at right axilla in 08/2015, no pain or other complains, she also report right breast swelling, no pain, skin erythema, nipple discharge or other complain. She otherwise feels well. She was initially seen at urgent care in March, then was referred to establish her care with prior care physician at [facility] internal medicine center. Her screening mammogram was negative in January 2017. She subsequently underwent CT chest on 09/27/2015 which showed enlarged and inflamed right breast, bulky right axillary lymph nodes. She was referred to breast Center for diagnostic mammogram and ultrasound of right breast, which was negative. She underwent ultrasound guided right axillary node biopsy on 12/05/2015, which reviewed port differentiated carcinoma. ER weakly focally positive.  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 She works independently. She lives with her husband. She denies any fever, night sweats, chills, or skin itchiness.  CURRENT THERAPY: 4th line Eribulin infusion 2 weeks on and  1 week off, and Keytruda every 3 weeks, started 12/05/16, Eribulin held after 7/12 due to palliative radiation   INTERIM HISTORY:  Nakeya returns for follow up. The patient is accompanied by her husband today. She reports she had issues and thought it was related to her RT, she only had it for. She now has more pain and redness in her right breast. She was afraid to take it again. She finds it harder to lower her right arm due to her growing lymph node. She is taking oxycodone  to help for the pain but lasts for only 2-3 hours, she takes it 3 times a day. She still has tramadol but it is not strong enough for her. Her husband reports she does not eat a lot of food. She is concerned with her appetite. She has trouble sleeping due to pain and she can only lay on her back. She has slight pain. Her port location is sore.  She feels some days she is fatigued and others she can get up and do things. She has some chest pain but no SOB.    MEDICAL HISTORY:  Past Medical History:  Diagnosis Date  . Arthritis   . Breast cancer ([place])    right  . Family history of breast cancer   . GERD (gastroesophageal reflux disease)   . Hypertension   . Shortness of breath   . Sleep apnea   . Stroke [facility])     SURGICAL HISTORY: Past Surgical History:  Procedure Laterality Date  . IR CV LINE INJECTION  11/28/2016  . IR GENERIC HISTORICAL  06/13/2016   IR US GUIDE VASC ACCESS LEFT 06/13/2016 Corrie Mckusick, DO [facility]-INTERV RAD  . IR GENERIC HISTORICAL  06/13/2016   IR FLUORO GUIDE PORT INSERTION LEFT 06/13/2016 Corrie Mckusick, DO [facility]-INTER[facility]  . NO PAST SURGERIES      SOCIAL HISTORY: Social History   Social History  . Marital status: Married    Spouse name: N/A  . Number of children: 4  . Years of education: 8 TH   Occupational History  . Not on file.   Social History Main Topics  . Smoking status: Former Smoker    Packs/day: 0.50    Years: 1.00    Types: Cigarettes    Start date: 06/17/1968    Quit date: 06/17/1973  . Smokeless tobacco: Never Used     Comment: Quit in 34s  . Alcohol use No     Comment: former drinker for approximately 10 years  . Drug use: No  . Sexual activity: Not on file   Other Topics Concern  . Not on file   Social History Narrative   Patient is married with 3 children still living and 1 deceased son.   Patient is right handed.   Patient has 8 th grade education.   Patient drinks 2-3 servings daily.    FAMILY HISTORY: Family History    Problem Relation Age of Onset  . Heart disease Mother   . Cancer Father 2       throat cancer  . Breast cancer Cousin        paternal first cousin dx in her late 98s-early 60s  . Cancer Sister        "cancer down below"    ALLERGIES:  has No Known Allergies.  MEDICATIONS:  Current Outpatient Prescriptions  Medication Sig Dispense Refill  . amLODipine (NORVASC) 10 MG tablet Take 1 tablet (10 mg total) by mouth daily. 90 tablet 3  .  aspirin EC 81 MG tablet Take 1 tablet (81 mg total) by mouth daily. 90 tablet 3  . atorvastatin (LIPITOR) 20 MG tablet TAKE 1 TABLET EVERY DAY (Patient taking differently: Take 20 mg by mouth daily) 90 tablet 3  . doxycycline (ADOXA) 100 MG tablet Take 1 tablet (100 mg total) by mouth 2 (two) times daily. 20 tablet 0  . lactulose (CHRONULAC) 10 GM/15ML solution Take 15-30 mLs (10-20 g total) by mouth every 8 (eight) hours as needed for mild constipation. 240 mL 0  . lidocaine-prilocaine (EMLA) cream Apply to port @ 1 hour prior to treatment. 30 g 1  . metoprolol tartrate (LOPRESSOR) 25 MG tablet TAKE 1 TABLET EVERY DAY 90 tablet 3  . mirtazapine (REMERON) 15 MG tablet Take 1 tablet (15 mg total) by mouth at bedtime. 30 tablet 2  . non-metallic deodorant (ALRA) MISC Apply 1 application topically daily as needed.    . ondansetron (ZOFRAN) 8 MG tablet Take by mouth every 8 (eight) hours as needed for nausea or vomiting.    Marland Kitchen oxyCODONE (OXYCONTIN) 10 mg 12 hr tablet Take 1 tablet (10 mg total) by mouth every 12 (twelve) hours. 30 tablet 0  . oxyCODONE (ROXICODONE) 5 MG/5ML solution Take 2.5-5 mLs (2.5-5 mg total) by mouth every 4 (four) hours as needed for moderate pain or severe pain. 120 mL 0  . oxyCODONE-acetaminophen (PERCOCET/ROXICET) 5-325 MG tablet Take 1 tablet by mouth every 4 (four) hours as needed for severe pain. 30 tablet 0  . prochlorperazine (COMPAZINE) 10 MG tablet Take 10 mg by mouth every 6 (six) hours as needed for nausea or vomiting.    .  ranitidine (ZANTAC) 150 MG capsule Take 1 capsule (150 mg total) by mouth daily. 10 capsule 0  . sucralfate (CARAFATE) 1 GM/10ML suspension Take 10 mLs (1 g total) by mouth 4 (four) times daily -  with meals and at bedtime. 420 mL 0  . traMADol (ULTRAM) 50 MG tablet Take 1 tablet (50 mg total) by mouth every 8 (eight) hours as needed. 60 tablet 1  . Wound Cleansers (RADIAPLEX EX) Apply topically.     No current facility-administered medications for this visit.    Facility-Administered Medications Ordered in Other Visits  Medication Dose Route Frequency Provider Last Rate Last Dose  . sodium chloride flush (NS) 0.9 % injection 10 mL  10 mL Intravenous PRN Truitt Merle, MD   10 mL at 08/12/16 1754  . sodium chloride flush (NS) 0.9 % injection 10 mL  10 mL Intravenous PRN Truitt Merle, MD   10 mL at 11/18/16 1516       REVIEW OF SYSTEMS:  Constitutional: Denies fevers, chills or abnormal night sweats  (+)low appetite (+)dizziness (+) trouble sleeping  Eyes: Denies blurriness of vision, double vision or watery eyes Ears, nose, mouth, throat, and face: Denies mucositis  Respiratory: Denies cough, dyspnea or wheezes (+) occasional chest pain Cardiovascular: Denies palpitation, chest discomfort  Gastrointestinal:  Denies nausea, heartburn  (+)constpation  Skin: no skin rashes.  Lymphatics: Denies new lymphadenopathy or easy bruising Musculoskeletal: (+) difficulty ambulating Neurological: Negative Behavioral/Psych: Mood is stable, no new changes (+) numbness and tingling hands and feet, overall much improved  Breast: (+) pain/hardening in left breast, worsened (+) right breast is hardened, worsened (+)10/10 pain in right breast All other systems were reviewed with the patient and are negative.   PHYSICAL EXAMINATION: ECOG PERFORMANCE STATUS: 1 Vitals:   01/30/17 1545  BP: 140/69  Pulse: 83  Resp:  20  Temp: 98.1 F (36.7 C)  TempSrc: Oral  SpO2: 100%  Weight: 117 lb 8 oz (53.3 kg)  Height:  _0  (1.626 m)    GENERAL:alert, no distress and comfortable SKIN: skin color, texture, turgor are normal, no rashes or significant lesions EYES: normal, conjunctiva are pink and non-injected, sclera clear OROPHARYNX:no exudate, no erythema and lips, buccal mucosa, and tongue normal  NECK: supple, thyroid normal size, non-tender, without nodularity LYMPH:  no palpable lymphadenopathy in the cervical, axillary or inguinal LUNGS: clear to auscultation and percussion with normal breathing effort HEART: regular rate & rhythm and no murmurs and no lower extremity edema ABDOMEN:abdomen soft, non-tender and normal bowel sounds Musculoskeletal:no cyanosis of digits and no clubbing  PSYCH: alert & oriented x 3 with fluent speech NEURO: Decreased vibration sensation on bilateral ankle and wrist. EXTREMITIES: Bilateral hand and lower extremity edema. Breasts: (+) her entire right breast is very firm., with associated skin edema, and there are multiple small areas in the low part of right breast which are erythematous, concerning for direct skin invasion from the tumor. (+) There is a 4X5 cm lymph node at the anterior right axilla, close to right chest wall of breast margin, no tenderness, movable.  (+) There is a orange size mass in central left breast, firm, and mildly tender. (+) There is a palpable 2 cm lymph node in the left axilla. (+) There are multiple new subcutaneous nodules below the right breast, which is highly suspicious for skin metastasis. Same appearance, but has gotten worse since previous exam    LABORATORY DATA:  I have reviewed the data as listed CBC Latest Ref Rng & Units 01/30/2017 01/20/2017 01/10/2017  WBC 3.9 - 10.3 10e3/uL 4.1 4.2 2.3(L)  Hemoglobin 11.6 - 15.9 g/dL 8.5(L) 9.0(L) 8.8(L)  Hematocrit 34.8 - 46.6 % 27.1(L) 29.0(L) 27.9(L)  Platelets 145 - 400 10e3/uL 263 310 265   CMP Latest Ref Rng & Units 01/30/2017 01/20/2017 01/10/2017  Glucose 70 - 140 mg/dl 90 89 86  BUN 7.0  - 26.0 mg/dL 34.9(H) 31(H) 26.8(H)  Creatinine 0.6 - 1.1 mg/dL 1.9(H) 1.63(H) 1.3(H)  Sodium 136 - 145 mEq/L 139 141 139  Potassium 3.5 - 5.1 mEq/L 4.8 5.1 4.7  Chloride 101 - 111 mmol/L - 105 -  CO2 22 - 29 mEq/L _1 Calcium 8.4 - 10.4 mg/dL 9.3 9.0 9.3  Total Protein 6.4 - 8.3 g/dL 6.9 - 7.3  Total Bilirubin 0.20 - 1.20 mg/dL 0.39 - 0.55  Alkaline Phos 40 - 150 U/L 63 - 62  AST 5 - 34 U/L 20 - 15  ALT 0 - 55 U/L 9 - 9    CA27.29:  01/05/2016: 155.9 02/27/2016: 96.7 03/26/2016: 64.7 04/23/2016: 65.4 05/21/2016: 99.2 07/15/2016: 215.4  08/12/2016: 176.9 09/09/2016: 112 10/07/16: 109.6 11/18/16: 251.6 12/05/2016: 239.8 12/26/2016: 291.8 01/30/17: PENDING    PATHOLOGY REPORT: Diagnosis 11/30/2015 Lymph node, needle/core biopsy, right breast/axilla - LYMPH NODE POSITIVE FOR METASTATIC POORLY DIFFERENTIATED CARCINOMA. - SEE COMMENT. Microscopic Comment Immunohistochemical stains are performed. The tumor is positive for cytokeratin AE1/AE3, cytokeratin 7 and E-cadherin. CDX-2 demonstrates weak positivity. Estrogen receptor demonstrates focal very weak to equivocal staining. Gross cystic disease fluid protein, cytokeratin 20, TTF-1, Melan-A and S100 are all negative. The findings are consistent with a poorly differentiated carcinoma. Given the staining pattern, an upper gastrointestinal and pancreatobiliary primary source should be ruled out. In addition, given the axillary location of the biopsy and focal weak to equivocal estrogen receptor staining,  ruling out a poorly differentiated carcinoma of breast primary source is also prudent. Lastly, a gynecologic primary may be considered. Correlation with clinical and radiologic impression is essential. Dr. Vicente Males has seen this case in consultation with essential agreement of the above diagnosis and comment. The findings are called to the [facility] on 12/04/15. (RAH:gt, 12/04/15)  Diagnosis 12/27/2015 Breast, right,  needle core biopsy, central - INVASIVE MAMMARY CARCINOMA. - MAMMARY CARCINOMA IN SITU. - LYMPH/VASCULAR INVASION IS IDENTIFIED. - SEE COMMENT. Microscopic Comment An E-cadherin stain will be performed to determine if the carcinoma is ductal or lobular in nature. The results of the stain will be reported in an addendum to follow. Although definitive grading of breast carcinoma is best done on excision, the features of the invasive tumor from the right central breast biopsy are compatible with a grade 3 breast carcinoma. Breast prognostic markers will be performed and reported in an addendum. Findings are called to the [facility] on 12/28/2015. Dr. Lyndon Code has seen this case in consultation with agreement. (RH:kh 12-28-15) ADDENDUM: An E-cadherin immunohistochemical stain is performed which is positive in both the invasive and in situ components confirming the ductal nature of both (i.e. invasive ductal carcinoma with ductal carcinoma in situ). (RAH:gt, 12/29/15) PROGNOSTIC INDICATORS Results: IMMUNOHISTOCHEMICAL AND MORPHOMETRIC ANALYSIS PERFORMED MANUALLY Estrogen Receptor: 0%, NEGATIVE Progesterone Receptor: 0%, NEGATIVE Proliferation Marker Ki67: 70% Results: HER2 - NEGATIVE RATIO OF HER2/CEP17 SIGNALS 1.13 AVERAGE HER2 COPY NUMBER PER CELL 1.75  Diagnosis 02/07/2016 Lymph node, needle/core biopsy, left axillary - METASTATIC POORLY DIFFERENTIATED CARCINOMA. Results: IMMUNOHISTOCHEMICAL AND MORPHOMETRIC ANALYSIS PERFORMED MANUALLY Estrogen Receptor: 0%, NEGATIVE Progesterone Receptor: 0%, NEGATIVE Results: HER2 - NEGATIVE RATIO OF HER2/CEP17 SIGNALS 1.21 AVERAGE HER2 COPY NUMBER PER CELL 2.05  RADIOGRAPHIC STUDIES: I have personally reviewed the radiological images as listed and agreed with the findings in the report.  PET 11/27/16 IMPRESSION: 1. Interval progression of disease. 2. There is increased hypermetabolic activity within bilateral axillary and right  pectoral lymph nodes which are similar in overall size to the prior study. 3. There is also extensive hypermetabolic activity throughout both breasts which is nodular and progressive, concerning for local extension or inflammatory breast cancer. 4. New multifocal hypermetabolic osseous metastatic disease. 5. No evidence of metastatic disease to the lungs or within the abdomen or pelvis.  PET 08/23/2016 IMPRESSION: 1. Response to therapy. Significant decrease in size and hypermetabolism of right breast mass and bilateral axillary nodal metastasis. Hypermetabolism within a right supraclavicular node has resolved. 2. New soft tissue fullness and hypermetabolism within the lateral left breast, suspicious for either metastatic disease or new primary. Mastitis could look similar. 3. No extrathoracic metastatic disease identified. 4.  Coronary artery atherosclerosis. Aortic atherosclerosis. 5. Diffuse marrow hypermetabolism is likely related to stimulation by chemotherapy. This decreases sensitivity for osseous metastasis.  PET 06/05/2016 IMPRESSION: 1. Progressive right breast mass, expanding in size and metabolic activity. Enlarging but bilateral axillary adenopathy likewise increased activity. A right supraclavicular lymph node is slightly smaller than previous but has a higher standard uptake value. Overall appearance compatible with progression but no further new metastatic spread compared to 01/03/2016. 2. Coronary, aortic arch, and branch vessel atherosclerotic vascular disease. Aortoiliac atherosclerotic vascular disease. 3. Extensive edema along the anterior chest wall especially both breasts, right greater than left.  ASSESSMENT & PLAN:  71 y.o. female, with past medical history of HTN, GERD, sleep apnea, presented with bulky right axillary adenopathy.  1. Primary cancer of right breast with metastasis  to distant lymph nodes, invasive ductal carcinoma and DCIS, ER-/PR-/HER2-,   [id number], stage IV  -I previously reviewed her mammogram, ultrasound and a CT chest, and biopsy pathology findings with patient and her husband in details -I previously discussed her breast MRI, PET scan as this is abiopsy results in great details with patient and her family members  -Her breast MRI findings and breast biopsy confirmed primary breast cancer, triple negative. Unfortunately her cancer has metastasized to left axilla and cervical lymph nodes, which are distant metastasis.  -We previously reviewed the natural history of metastatic triple negative breast cancer, which is very aggressive, and her cancer is incurable at this stage  -Giving the metastatic disease, surgery up front is not indicated, I recommended systemic chemotherapy. We previously discussed PET scan form 11/27/16 shows her cancer is progressing and has metastasized to both breast, lymph nodes and in parts of her bone.  -she is currently on 4th line chemo with Eribulin for 2 weeks on and 1 week off along with and immunotherapy drug Nat Math, based on the [place]. It is off label use.   -I previously tested her BRCA1/2 to see if she is a candidate for PARPA inhibitor, the test was done on 7/16/208, result is still pending  -she has started 4th line Eribulin and Keytruda  -Due to the concern of his of progression, and likely direct skin invasion, which can cause open wound, I have referred her to radiation oncology to consider palliative radiation to right breast, which she will start on 7/31  -She has been tolerating chemotherapy poorly with significant pancytopenia. Eribulin was held after second dose on 12/26/16 due to poor tolerance and pending palliative radiation. -She was seen by a radiation oncologist Dr. Sondra Come and is scheduled to started palliative of bilateral breast radiation, however patient request is to stop due to her worsening breast pain after 2 sessions - I discussed that she has been through multiple  lines of chemotherapy so RT is her next best option. I discussed that RT will overall help her with her pain.  -Due to growth of tumor, her port is closer to it. And she experiences soreness there but it is still accessible.  -Labs reviewed and she is slightly anemic, 8.5, but does not need blood transfusion, I will re-evaluate at next lab.  -will continue Keytruda infusion, reschedule from tomorrow to next week per her request  2. ANEMIA in neoplastic disease -She previously developed normocytic mild anemia in the past few months. -Her anemia workup previously showed a normal folic acid and Y07 level, increased ferritin, decreased serum iron, TIBC and transferrin saturation. This is most consistent with anemia of chronic disease, secondary to her CKD and underlying malignancy.  -I previously encouraged her to continue oral iron supplement -Blood transfusion if hemoglobin less than 8.0.  3. Neutropenia and thrombocytopenia from chemotherapy -She had nadir plt 26K and ANC 0.4K after first cycle chemotherapy -Her blood counts are recovering. -We previously reduced her chemotherapy dose the following cycle.  4. CKD stage III -Her GFR was previously in 40-50's  -No lab signs of tumor lysis -Possibly related to her underlying hypertension and diuretics  -Her Cr has previously much improved since she stopped her Lasix. -Cr increased to 1.6 as of 01/02/17, so she received IVF. -Avoid nephrotoxins, such as NSAIDs and IV CT contrast - Patient should stay well hydrated.   5. HTN, GERD, obesity, sleep apnea -She'll continue follow-up with her primary care physician  6. Right breast  pain -secondary to breast cancer, previously much improved after chemo  -continue tramadol as needed  -She previously felt hardening in right breast but no pain.  - Continue Tramadol prescribed by Dr. Sondra Come. -For her worsening pain, I prescribe Oxycontin 37m pain medication to take 1-2 times a day and use low dose  oxycodone as needed in between. I discussed that this can cause constipation So I will prescribe her lactulose to help.   7. Dizziness, anorexia and fatigue  -She has been quite fatigued, and complained of intermittent dizziness, especially when she stands up the week of 7/20 -secondary to chemotherapy. She does not have orthopedic hypotension, but I gave IVF previously.  -improved this week - The patient can ambulate around the house with help, but is scared of falling. I encouraged her to use a walker around the home as a precaution. - She is in a wheelchair in the clinic. She still has trouble ambulating  -For her appetite I will order Mirtazapine for her to take at night.   7. Goal of care discussion  -We previously discussed the incurable nature of her cancer, and the overall poor prognosis, especially if she does not have good response to chemotherapy or progress on chemo -The patient understands the goal of care is palliative. -I previously recommend DNR/DNI, she will think about it    PLAN -she has agreed to restart radiation next Monday -Order Mirtazapine, Lactulose and Oxycontin [id number] -Move her Keytruda infusion from tomorrow to next week around her radiation appointment  -Lab and f/u on 8/30 or 8/31 before or after RT, OK to see her at 12:45pm     I spent 25 minutes counseling the patient face to face. The total time spent in the appointment was 30 minutes and more than 50% was on counseling.  This document serves as a record of services personally performed by YaTruitt MerleMD. It was created on her behalf by AmJoslyn Devona trained medical scribe. The creation of this record is based on the scribe's personal observations and the provider's statements to them. This document has been checked and approved by the attending provider.   I have reviewed the above documentation for accuracy and completeness and I agree with the above.    FeTruitt MerleMD 01/30/2017

## 2017-01-31 ENCOUNTER — Ambulatory Visit: Payer: Medicare HMO

## 2017-01-31 LAB — CANCER ANTIGEN 27.29: CA 27.29: 394.2 U/mL — ABNORMAL HIGH (ref 0.0–38.6)

## 2017-02-03 ENCOUNTER — Ambulatory Visit
Admission: RE | Admit: 2017-02-03 | Discharge: 2017-02-03 | Disposition: A | Discharge status: Home / Self Care | Payer: Medicare HMO | Source: Ambulatory Visit | Attending: Radiation Oncology | Admitting: Radiation Oncology

## 2017-02-03 DIAGNOSIS — N644 Mastodynia: Secondary | ICD-10-CM | POA: Diagnosis not present

## 2017-02-03 DIAGNOSIS — C50911 Malignant neoplasm of unspecified site of right female breast: Secondary | ICD-10-CM | POA: Diagnosis not present

## 2017-02-03 DIAGNOSIS — Z171 Estrogen receptor negative status [ER-]: Secondary | ICD-10-CM | POA: Diagnosis not present

## 2017-02-03 DIAGNOSIS — Z510 Encounter for antineoplastic radiation therapy: Secondary | ICD-10-CM | POA: Diagnosis not present

## 2017-02-03 DIAGNOSIS — Z7982 Long term (current) use of aspirin: Secondary | ICD-10-CM | POA: Diagnosis not present

## 2017-02-03 DIAGNOSIS — C773 Secondary and unspecified malignant neoplasm of axilla and upper limb lymph nodes: Secondary | ICD-10-CM | POA: Diagnosis not present

## 2017-02-03 DIAGNOSIS — C7981 Secondary malignant neoplasm of breast: Secondary | ICD-10-CM | POA: Diagnosis not present

## 2017-02-03 DIAGNOSIS — Z9221 Personal history of antineoplastic chemotherapy: Secondary | ICD-10-CM | POA: Diagnosis not present

## 2017-02-03 DIAGNOSIS — Z79899 Other long term (current) drug therapy: Secondary | ICD-10-CM | POA: Diagnosis not present

## 2017-02-04 ENCOUNTER — Ambulatory Visit
Admission: RE | Admit: 2017-02-04 | Discharge: 2017-02-04 | Disposition: A | Discharge status: Home / Self Care | Payer: Medicare HMO | Source: Ambulatory Visit | Attending: Radiation Oncology | Admitting: Radiation Oncology

## 2017-02-04 ENCOUNTER — Other Ambulatory Visit: Payer: Self-pay | Admitting: Radiation Oncology

## 2017-02-04 DIAGNOSIS — C50911 Malignant neoplasm of unspecified site of right female breast: Secondary | ICD-10-CM | POA: Diagnosis not present

## 2017-02-04 DIAGNOSIS — Z171 Estrogen receptor negative status [ER-]: Secondary | ICD-10-CM | POA: Diagnosis not present

## 2017-02-04 DIAGNOSIS — C7981 Secondary malignant neoplasm of breast: Secondary | ICD-10-CM | POA: Diagnosis not present

## 2017-02-04 DIAGNOSIS — N644 Mastodynia: Secondary | ICD-10-CM | POA: Diagnosis not present

## 2017-02-04 DIAGNOSIS — C773 Secondary and unspecified malignant neoplasm of axilla and upper limb lymph nodes: Secondary | ICD-10-CM | POA: Diagnosis not present

## 2017-02-04 DIAGNOSIS — Z7982 Long term (current) use of aspirin: Secondary | ICD-10-CM | POA: Diagnosis not present

## 2017-02-04 DIAGNOSIS — Z510 Encounter for antineoplastic radiation therapy: Secondary | ICD-10-CM | POA: Diagnosis not present

## 2017-02-04 DIAGNOSIS — Z9221 Personal history of antineoplastic chemotherapy: Secondary | ICD-10-CM | POA: Diagnosis not present

## 2017-02-04 DIAGNOSIS — Z79899 Other long term (current) drug therapy: Secondary | ICD-10-CM | POA: Diagnosis not present

## 2017-02-04 MED ORDER — RADIAPLEXRX EX GEL
Freq: Once | CUTANEOUS | Status: AC
  Administered 2017-02-04: 17:00:00 via TOPICAL

## 2017-02-04 MED ORDER — OXYCODONE-ACETAMINOPHEN 5-325 MG PO TABS: 1.0000 | ORAL_TABLET | ORAL | 0 refills | Status: DC | PRN

## 2017-02-04 MED ORDER — OXYCODONE HCL ER 10 MG PO T12A: 10.0000 mg | EXTENDED_RELEASE_TABLET | Freq: Two times a day (BID) | ORAL | 0 refills | Status: DC

## 2017-02-04 MED ORDER — SONAFINE EX EMUL
1.0000 "application " | Freq: Once | CUTANEOUS | Status: AC
  Administered 2017-02-04: 1 via TOPICAL

## 2017-02-04 MED FILL — OXYCODONE-ACETAMINOPHEN 5-3: 5-325 | 5 days supply | Qty: 30 | Fill #0

## 2017-02-04 MED FILL — OxyCONTIN 10 MG T12A: 10 | 15 days supply | Qty: 30 | Fill #0

## 2017-02-05 ENCOUNTER — Ambulatory Visit
Admission: RE | Admit: 2017-02-05 | Discharge: 2017-02-05 | Disposition: A | Discharge status: Home / Self Care | Payer: Medicare HMO | Source: Ambulatory Visit | Attending: Radiation Oncology | Admitting: Radiation Oncology

## 2017-02-05 DIAGNOSIS — N644 Mastodynia: Secondary | ICD-10-CM | POA: Diagnosis not present

## 2017-02-05 DIAGNOSIS — Z7982 Long term (current) use of aspirin: Secondary | ICD-10-CM | POA: Diagnosis not present

## 2017-02-05 DIAGNOSIS — Z79899 Other long term (current) drug therapy: Secondary | ICD-10-CM | POA: Diagnosis not present

## 2017-02-05 DIAGNOSIS — C773 Secondary and unspecified malignant neoplasm of axilla and upper limb lymph nodes: Secondary | ICD-10-CM | POA: Diagnosis not present

## 2017-02-05 DIAGNOSIS — Z171 Estrogen receptor negative status [ER-]: Secondary | ICD-10-CM | POA: Diagnosis not present

## 2017-02-05 DIAGNOSIS — C7981 Secondary malignant neoplasm of breast: Secondary | ICD-10-CM | POA: Diagnosis not present

## 2017-02-05 DIAGNOSIS — Z9221 Personal history of antineoplastic chemotherapy: Secondary | ICD-10-CM | POA: Diagnosis not present

## 2017-02-05 DIAGNOSIS — Z510 Encounter for antineoplastic radiation therapy: Secondary | ICD-10-CM | POA: Diagnosis not present

## 2017-02-05 DIAGNOSIS — C50911 Malignant neoplasm of unspecified site of right female breast: Secondary | ICD-10-CM | POA: Diagnosis not present

## 2017-02-06 ENCOUNTER — Ambulatory Visit
Admission: RE | Admit: 2017-02-06 | Discharge: 2017-02-06 | Disposition: A | Discharge status: Home / Self Care | Payer: Medicare HMO | Source: Ambulatory Visit | Attending: Radiation Oncology | Admitting: Radiation Oncology

## 2017-02-06 DIAGNOSIS — Z171 Estrogen receptor negative status [ER-]: Secondary | ICD-10-CM | POA: Diagnosis not present

## 2017-02-06 DIAGNOSIS — Z510 Encounter for antineoplastic radiation therapy: Secondary | ICD-10-CM | POA: Diagnosis not present

## 2017-02-06 DIAGNOSIS — Z7982 Long term (current) use of aspirin: Secondary | ICD-10-CM | POA: Diagnosis not present

## 2017-02-06 DIAGNOSIS — C7981 Secondary malignant neoplasm of breast: Secondary | ICD-10-CM | POA: Diagnosis not present

## 2017-02-06 DIAGNOSIS — C50911 Malignant neoplasm of unspecified site of right female breast: Secondary | ICD-10-CM | POA: Diagnosis not present

## 2017-02-06 DIAGNOSIS — Z79899 Other long term (current) drug therapy: Secondary | ICD-10-CM | POA: Diagnosis not present

## 2017-02-06 DIAGNOSIS — Z9221 Personal history of antineoplastic chemotherapy: Secondary | ICD-10-CM | POA: Diagnosis not present

## 2017-02-06 DIAGNOSIS — N644 Mastodynia: Secondary | ICD-10-CM | POA: Diagnosis not present

## 2017-02-06 DIAGNOSIS — C773 Secondary and unspecified malignant neoplasm of axilla and upper limb lymph nodes: Secondary | ICD-10-CM | POA: Diagnosis not present

## 2017-02-07 ENCOUNTER — Other Ambulatory Visit: Payer: Self-pay

## 2017-02-07 ENCOUNTER — Ambulatory Visit (HOSPITAL_BASED_OUTPATIENT_CLINIC_OR_DEPARTMENT_OTHER): Payer: Medicare HMO

## 2017-02-07 ENCOUNTER — Ambulatory Visit
Admission: RE | Admit: 2017-02-07 | Discharge: 2017-02-07 | Disposition: A | Discharge status: Home / Self Care | Payer: Medicare HMO | Source: Ambulatory Visit | Attending: Radiation Oncology | Admitting: Radiation Oncology

## 2017-02-07 VITALS — BP 129/63 | HR 79 | Temp 98.6°F | Resp 20 | Wt 176.5 lb

## 2017-02-07 DIAGNOSIS — C773 Secondary and unspecified malignant neoplasm of axilla and upper limb lymph nodes: Secondary | ICD-10-CM

## 2017-02-07 DIAGNOSIS — C7981 Secondary malignant neoplasm of breast: Secondary | ICD-10-CM | POA: Diagnosis not present

## 2017-02-07 DIAGNOSIS — C50911 Malignant neoplasm of unspecified site of right female breast: Secondary | ICD-10-CM

## 2017-02-07 DIAGNOSIS — Z510 Encounter for antineoplastic radiation therapy: Secondary | ICD-10-CM | POA: Diagnosis not present

## 2017-02-07 DIAGNOSIS — Z171 Estrogen receptor negative status [ER-]: Secondary | ICD-10-CM | POA: Diagnosis not present

## 2017-02-07 DIAGNOSIS — N644 Mastodynia: Secondary | ICD-10-CM | POA: Diagnosis not present

## 2017-02-07 DIAGNOSIS — Z7982 Long term (current) use of aspirin: Secondary | ICD-10-CM | POA: Diagnosis not present

## 2017-02-07 DIAGNOSIS — Z9221 Personal history of antineoplastic chemotherapy: Secondary | ICD-10-CM | POA: Diagnosis not present

## 2017-02-07 DIAGNOSIS — Z5112 Encounter for antineoplastic immunotherapy: Secondary | ICD-10-CM | POA: Diagnosis not present

## 2017-02-07 DIAGNOSIS — Z79899 Other long term (current) drug therapy: Secondary | ICD-10-CM | POA: Diagnosis not present

## 2017-02-07 LAB — BASIC METABOLIC PANEL
ANION GAP: 10 meq/L (ref 3–11)
BUN: 40.7 mg/dL — ABNORMAL HIGH (ref 7.0–26.0)
CALCIUM: 8.9 mg/dL (ref 8.4–10.4)
CO2: 23 mEq/L (ref 22–29)
Chloride: 106 mEq/L (ref 98–109)
Creatinine: 1.5 mg/dL — ABNORMAL HIGH (ref 0.6–1.1)
EGFR: 40 mL/min/{1.73_m2} — AB (ref >90)
Glucose: 80 mg/dl (ref 70–140)
POTASSIUM: 4.8 meq/L (ref 3.5–5.1)
Sodium: 139 mEq/L (ref 136–145)

## 2017-02-07 MED ORDER — SODIUM CHLORIDE 0.9 % IV SOLN
200.0000 mg | Freq: Once | INTRAVENOUS | Status: AC
  Administered 2017-02-07: 200 mg via INTRAVENOUS
  Filled 2017-02-07: qty 8

## 2017-02-07 MED ORDER — HEPARIN SOD (PORK) LOCK FLUSH 100 UNIT/ML IV SOLN
500.0000 [IU] | Freq: Once | INTRAVENOUS | Status: AC | PRN
  Administered 2017-02-07: 500 [IU]
  Filled 2017-02-07: qty 5

## 2017-02-07 MED ORDER — SODIUM CHLORIDE 0.9% FLUSH
10.0000 mL | INTRAVENOUS | Status: DC | PRN
  Administered 2017-02-07: 10 mL
  Filled 2017-02-07: qty 10

## 2017-02-07 MED ORDER — SODIUM CHLORIDE 0.9 % IV SOLN
Freq: Once | INTRAVENOUS | Status: AC
  Administered 2017-02-07: 15:00:00 via INTRAVENOUS

## 2017-02-07 NOTE — Patient Instructions (Signed)
[facility] Discharge Instructions for Patients Receiving Chemotherapy  Today you received the following chemotherapy agents:  Keytruda.  To help prevent nausea and vomiting after your treatment, we encourage you to take your nausea medication as prescribed.   If you develop nausea and vomiting that is not controlled by your nausea medication, call the clinic.   BELOW ARE SYMPTOMS THAT SHOULD BE REPORTED IMMEDIATELY:  *FEVER GREATER THAN 100.5 F  *CHILLS WITH OR WITHOUT FEVER  NAUSEA AND VOMITING THAT IS NOT CONTROLLED WITH YOUR NAUSEA MEDICATION  *UNUSUAL SHORTNESS OF BREATH  *UNUSUAL BRUISING OR BLEEDING  TENDERNESS IN MOUTH AND THROAT WITH OR WITHOUT PRESENCE OF ULCERS  *URINARY PROBLEMS  *BOWEL PROBLEMS  UNUSUAL RASH Items with * indicate a potential emergency and should be followed up as soon as possible.  Feel free to call the clinic you have any questions or concerns. The clinic phone number is [phone].  Please show the CHEMO ALERT CARD at check-in to the Emergency Department and triage nurse.   

## 2017-02-07 NOTE — Progress Notes (Signed)
Ok to proceed with BMP only today per MD [name]

## 2017-02-10 ENCOUNTER — Ambulatory Visit
Admission: RE | Admit: 2017-02-10 | Discharge: 2017-02-10 | Disposition: A | Discharge status: Home / Self Care | Payer: Medicare HMO | Source: Ambulatory Visit | Attending: Radiation Oncology | Admitting: Radiation Oncology

## 2017-02-10 DIAGNOSIS — C773 Secondary and unspecified malignant neoplasm of axilla and upper limb lymph nodes: Secondary | ICD-10-CM | POA: Diagnosis not present

## 2017-02-10 DIAGNOSIS — Z171 Estrogen receptor negative status [ER-]: Secondary | ICD-10-CM | POA: Diagnosis not present

## 2017-02-10 DIAGNOSIS — Z7982 Long term (current) use of aspirin: Secondary | ICD-10-CM | POA: Diagnosis not present

## 2017-02-10 DIAGNOSIS — C50911 Malignant neoplasm of unspecified site of right female breast: Secondary | ICD-10-CM | POA: Diagnosis not present

## 2017-02-10 DIAGNOSIS — C7981 Secondary malignant neoplasm of breast: Secondary | ICD-10-CM | POA: Diagnosis not present

## 2017-02-10 DIAGNOSIS — N644 Mastodynia: Secondary | ICD-10-CM | POA: Diagnosis not present

## 2017-02-10 DIAGNOSIS — Z510 Encounter for antineoplastic radiation therapy: Secondary | ICD-10-CM | POA: Diagnosis not present

## 2017-02-10 DIAGNOSIS — Z79899 Other long term (current) drug therapy: Secondary | ICD-10-CM | POA: Diagnosis not present

## 2017-02-10 DIAGNOSIS — Z9221 Personal history of antineoplastic chemotherapy: Secondary | ICD-10-CM | POA: Diagnosis not present

## 2017-02-11 ENCOUNTER — Ambulatory Visit
Admission: RE | Admit: 2017-02-11 | Discharge: 2017-02-11 | Disposition: A | Discharge status: Home / Self Care | Payer: Medicare HMO | Source: Ambulatory Visit | Attending: Radiation Oncology | Admitting: Radiation Oncology

## 2017-02-11 DIAGNOSIS — N644 Mastodynia: Secondary | ICD-10-CM | POA: Diagnosis not present

## 2017-02-11 DIAGNOSIS — C50911 Malignant neoplasm of unspecified site of right female breast: Secondary | ICD-10-CM | POA: Diagnosis not present

## 2017-02-11 DIAGNOSIS — C7981 Secondary malignant neoplasm of breast: Secondary | ICD-10-CM | POA: Diagnosis not present

## 2017-02-11 DIAGNOSIS — Z9221 Personal history of antineoplastic chemotherapy: Secondary | ICD-10-CM | POA: Diagnosis not present

## 2017-02-11 DIAGNOSIS — C773 Secondary and unspecified malignant neoplasm of axilla and upper limb lymph nodes: Secondary | ICD-10-CM | POA: Diagnosis not present

## 2017-02-11 DIAGNOSIS — Z171 Estrogen receptor negative status [ER-]: Secondary | ICD-10-CM | POA: Diagnosis not present

## 2017-02-11 DIAGNOSIS — Z79899 Other long term (current) drug therapy: Secondary | ICD-10-CM | POA: Diagnosis not present

## 2017-02-11 DIAGNOSIS — Z510 Encounter for antineoplastic radiation therapy: Secondary | ICD-10-CM | POA: Diagnosis not present

## 2017-02-11 DIAGNOSIS — Z7982 Long term (current) use of aspirin: Secondary | ICD-10-CM | POA: Diagnosis not present

## 2017-02-12 ENCOUNTER — Emergency Department (HOSPITAL_COMMUNITY): Payer: Medicare HMO

## 2017-02-12 ENCOUNTER — Ambulatory Visit: Payer: Medicare HMO

## 2017-02-12 ENCOUNTER — Emergency Department (HOSPITAL_COMMUNITY)
Admission: EM | Admit: 2017-02-12 | Discharge: 2017-02-12 | Disposition: A | Discharge status: Home / Self Care | Payer: Medicare HMO | Attending: Emergency Medicine | Admitting: Emergency Medicine

## 2017-02-12 ENCOUNTER — Encounter (HOSPITAL_COMMUNITY): Payer: Self-pay

## 2017-02-12 DIAGNOSIS — I10 Essential (primary) hypertension: Secondary | ICD-10-CM | POA: Diagnosis not present

## 2017-02-12 DIAGNOSIS — R52 Pain, unspecified: Secondary | ICD-10-CM

## 2017-02-12 DIAGNOSIS — G893 Neoplasm related pain (acute) (chronic): Secondary | ICD-10-CM | POA: Diagnosis not present

## 2017-02-12 DIAGNOSIS — Z87891 Personal history of nicotine dependence: Secondary | ICD-10-CM | POA: Diagnosis not present

## 2017-02-12 DIAGNOSIS — Y842 Radiological procedure and radiotherapy as the cause of abnormal reaction of the patient, or of later complication, without mention of misadventure at the time of the procedure: Secondary | ICD-10-CM | POA: Insufficient documentation

## 2017-02-12 DIAGNOSIS — R079 Chest pain, unspecified: Secondary | ICD-10-CM | POA: Diagnosis not present

## 2017-02-12 DIAGNOSIS — R0789 Other chest pain: Secondary | ICD-10-CM | POA: Insufficient documentation

## 2017-02-12 DIAGNOSIS — Z79899 Other long term (current) drug therapy: Secondary | ICD-10-CM | POA: Insufficient documentation

## 2017-02-12 DIAGNOSIS — D0591 Unspecified type of carcinoma in situ of right breast: Secondary | ICD-10-CM | POA: Insufficient documentation

## 2017-02-12 LAB — CBC WITH DIFFERENTIAL/PLATELET
BASOS ABS: 0 10*3/uL (ref 0.0–0.1)
BASOS PCT: 1 %
EOS ABS: 0.1 10*3/uL (ref 0.0–0.7)
EOS PCT: 2 %
HCT: 26.9 % — ABNORMAL LOW (ref 36.0–46.0)
HEMOGLOBIN: 8.3 g/dL — AB (ref 12.0–15.0)
Lymphocytes Relative: 10 %
Lymphs Abs: 0.4 10*3/uL — ABNORMAL LOW (ref 0.7–4.0)
MCH: 23.6 pg — ABNORMAL LOW (ref 26.0–34.0)
MCHC: 30.9 g/dL (ref 30.0–36.0)
MCV: 76.6 fL — ABNORMAL LOW (ref 78.0–100.0)
Monocytes Absolute: 0.3 10*3/uL (ref 0.1–1.0)
Monocytes Relative: 6 %
NEUTROS PCT: 81 %
Neutro Abs: 3.5 10*3/uL (ref 1.7–7.7)
PLATELETS: 246 10*3/uL (ref 150–400)
RBC: 3.51 MIL/uL — AB (ref 3.87–5.11)
RDW: 18 % — ABNORMAL HIGH (ref 11.5–15.5)
WBC: 4.3 10*3/uL (ref 4.0–10.5)

## 2017-02-12 LAB — COMPREHENSIVE METABOLIC PANEL
ALBUMIN: 3.4 g/dL — AB (ref 3.5–5.0)
ALK PHOS: 49 U/L (ref 38–126)
ALT: 11 U/L — AB (ref 14–54)
AST: 23 U/L (ref 15–41)
Anion gap: 10 (ref 5–15)
BUN: 35 mg/dL — ABNORMAL HIGH (ref 6–20)
CALCIUM: 8.7 mg/dL — AB (ref 8.9–10.3)
CHLORIDE: 106 mmol/L (ref 101–111)
CO2: 22 mmol/L (ref 22–32)
CREATININE: 1.51 mg/dL — AB (ref 0.44–1.00)
GFR calc non Af Amer: 34 mL/min — ABNORMAL LOW (ref >60)
GFR, EST AFRICAN AMERICAN: 39 mL/min — AB (ref >60)
Glucose, Bld: 103 mg/dL — ABNORMAL HIGH (ref 65–99)
Potassium: 4.6 mmol/L (ref 3.5–5.1)
SODIUM: 138 mmol/L (ref 135–145)
Total Bilirubin: 0.4 mg/dL (ref 0.3–1.2)
Total Protein: 6.8 g/dL (ref 6.5–8.1)

## 2017-02-12 LAB — I-STAT TROPONIN, ED: TROPONIN I, POC: 0.05 ng/mL (ref 0.00–0.08)

## 2017-02-12 MED ORDER — LEVOFLOXACIN 500 MG PO TABS
500.0000 mg | ORAL_TABLET | Freq: Once | ORAL | Status: AC
Start: 2017-02-12 — End: 2017-02-12
  Administered 2017-02-12: 500 mg via ORAL
  Filled 2017-02-12: qty 1

## 2017-02-12 MED ORDER — LEVOFLOXACIN 500 MG PO TABS: 500.0000 mg | ORAL_TABLET | Freq: Every day | ORAL | 0 refills | Status: DC

## 2017-02-12 MED ORDER — ONDANSETRON 4 MG PO TBDP
4.0000 mg | ORAL_TABLET | Freq: Once | ORAL | Status: AC
  Administered 2017-02-12: 4 mg via ORAL
  Filled 2017-02-12: qty 1

## 2017-02-12 MED ORDER — OXYCODONE-ACETAMINOPHEN 5-325 MG PO TABS
2.0000 | ORAL_TABLET | Freq: Once | ORAL | Status: AC
  Administered 2017-02-12: 2 via ORAL
  Filled 2017-02-12: qty 2

## 2017-02-12 NOTE — ED Notes (Signed)
Bed: JG28 Expected date:  Expected time:  Means of arrival:  Comments: Breast cancer/chest pain

## 2017-02-12 NOTE — ED Notes (Signed)
Pt has hx of breast cancer and called EMS tonight after waking up with a burning sensation across her chest, pt has had radiation this week but this reaction is new Pt has a port in her left chest

## 2017-02-12 NOTE — Discharge Instructions (Signed)
Please take your OxyContin twice daily as prescribed. Please take your oxycodone/acetaminophen 1-2 tablets every 4-6 hours as needed for pain.

## 2017-02-12 NOTE — ED Provider Notes (Signed)
TIME SEEN: 4:52 AM  CHIEF COMPLAINT: Chest wall pain  HPI: Patient is a 71 year old female with history of hypertension, CVA, GERD, right breast cancer currently on radiation. States she has been having radiation this entire week and started having skin changes and burning over the past several days. She is on pain medication home but she states she did not take it prior to arrival because it hurt too much. No shortness of breath, nausea, vomiting, cough, fever, lower extremity swelling or pain. She feels that this is pain in the skin of her chest. Patient reports the pain is been constant for over 24 hours. No aggravating or alleviating factors. No radiation of pain. Patient is a poor historian.  Patient gave verbal permission to utilize photo for medical documentation only. The image was not stored on any personal device.   ROS: See HPI Constitutional: no fever  Eyes: no drainage  ENT: no runny nose   Cardiovascular:   chest pain  Resp: no SOB  GI: no vomiting GU: no dysuria Integumentary: no rash  Allergy: no hives  Musculoskeletal: no leg swelling  Neurological: no slurred speech ROS otherwise negative  PAST MEDICAL HISTORY/PAST SURGICAL HISTORY:  Past Medical History:  Diagnosis Date  . Arthritis   . Breast cancer ([place])    right  . Family history of breast cancer   . GERD (gastroesophageal reflux disease)   . Hypertension   . Shortness of breath   . Sleep apnea   . Stroke [facility])     MEDICATIONS:  Prior to Admission medications   Medication Sig Start Date End Date Taking? Authorizing Provider  amLODipine (NORVASC) 10 MG tablet Take 1 tablet (10 mg total) by mouth daily. 09/25/16   [name], MD  aspirin EC 81 MG tablet Take 1 tablet (81 mg total) by mouth daily. 04/27/14   Rosalin Hawking, MD  atorvastatin (LIPITOR) 20 MG tablet TAKE 1 TABLET EVERY DAY Patient taking differently: Take 20 mg by mouth daily 05/28/16   [name], MD  doxycycline (ADOXA) 100 MG  tablet Take 1 tablet (100 mg total) by mouth 2 (two) times daily. 01/23/17   Gery Pray, MD  lactulose (CHRONULAC) 10 GM/15ML solution Take 15-30 mLs (10-20 g total) by mouth every 8 (eight) hours as needed for mild constipation. 01/30/17   Truitt Merle, MD  lidocaine-prilocaine (EMLA) cream Apply to port @ 1 hour prior to treatment. 06/19/16   Truitt Merle, MD  metoprolol tartrate (LOPRESSOR) 25 MG tablet TAKE 1 TABLET EVERY DAY 07/09/16   [name], MD  mirtazapine (REMERON) 15 MG tablet Take 1 tablet (15 mg total) by mouth at bedtime. 01/30/17   Truitt Merle, MD  non-metallic deodorant Jethro Poling) MISC Apply 1 application topically daily as needed.    [provider]  ondansetron (ZOFRAN) 8 MG tablet Take by mouth every 8 (eight) hours as needed for nausea or vomiting.    [provider]  oxyCODONE (OXYCONTIN) 10 mg 12 hr tablet Take 1 tablet (10 mg total) by mouth every 12 (twelve) hours. 02/04/17   Gery Pray, MD  oxyCODONE (ROXICODONE) 5 MG/5ML solution Take 2.5-5 mLs (2.5-5 mg total) by mouth every 4 (four) hours as needed for moderate pain or severe pain. 01/20/17   Duffy Bruce, MD  oxyCODONE-acetaminophen (PERCOCET/ROXICET) 5-325 MG tablet Take 1 tablet by mouth every 4 (four) hours as needed for severe pain. 02/04/17   Gery Pray, MD  prochlorperazine (COMPAZINE) 10 MG tablet Take 10 mg by  mouth every 6 (six) hours as needed for nausea or vomiting.    [provider]  ranitidine (ZANTAC) 150 MG capsule Take 1 capsule (150 mg total) by mouth daily. 01/20/17 01/30/17  Duffy Bruce, MD  sucralfate (CARAFATE) 1 GM/10ML suspension Take 10 mLs (1 g total) by mouth 4 (four) times daily -  with meals and at bedtime. 01/20/17   Duffy Bruce, MD  traMADol (ULTRAM) 50 MG tablet Take 1 tablet (50 mg total) by mouth every 8 (eight) hours as needed. 12/30/16   Gery Pray, MD  Wound Cleansers (RADIAPLEX EX) Apply topically.    [provider]  Wound Dressings (SONAFINE EX)  Apply topically.    [provider]    ALLERGIES:  No Known Allergies  SOCIAL HISTORY:  Social History  Substance Use Topics  . Smoking status: Former Smoker    Packs/day: 0.50    Years: 1.00    Types: Cigarettes    Start date: 06/17/1968    Quit date: 06/17/1973  . Smokeless tobacco: Never Used     Comment: Quit in 62s  . Alcohol use No     Comment: former drinker for approximately 10 years    FAMILY HISTORY: Family History  Problem Relation Age of Onset  . Heart disease Mother   . Cancer Father 70       throat cancer  . Breast cancer Cousin        paternal first cousin dx in her late 29s-early 60s  . Cancer Sister        "cancer down below"    EXAM: BP (!) 179/69 (BP Location: Left Wrist)   Pulse 85   Temp 97.8 F (36.6 C) (Oral)   SpO2 100% RR 20 CONSTITUTIONAL: Alert and oriented and responds appropriately to questions. Elderly, chronically ill-appearing, poor historian, repeatedly stating "I'm burning", patient is elderly HEAD: Normocephalic EYES: Conjunctivae clear, pupils appear equal, EOMI ENT: normal nose; moist mucous membranes NECK: Supple, no meningismus, no nuchal rigidity, no LAD  CARD: RRR; S1 and S2 appreciated; no murmurs, no clicks, no rubs, no gallops RESP: Normal chest excursion without splinting or tachypnea; breath sounds clear and equal bilaterally; no wheezes, no rhonchi, no rales, no hypoxia or respiratory distress, speaking full sentences ABD/GI: Normal bowel sounds; non-distended; soft, non-tender, no rebound, no guarding, no peritoneal signs, no hepatosplenomegaly BACK:  The back appears normal and is non-tender to palpation, there is no CVA tenderness EXT: Normal ROM in all joints; non-tender to palpation; no edema; normal capillary refill; no cyanosis, no calf tenderness or swelling    SKIN: Normal color for age and race; warm; no rash, skin changes noted to both breasts consistent with radiation changes (see below) NEURO: Moves  all extremities equally PSYCH: The patient's mood and manner are appropriate. Grooming and personal hygiene are appropriate.  MEDICAL DECISION MAKING: Patient here with pain in the skin from radiation changes of her breasts. There is no obvious sign of infection at this time. She does appear uncomfortable. We'll provide her with Percocet and Zofran. She is a poor historian but my suspicion that this is ACS is low but I will obtain an EKG, troponin and chest x-ray. Doubt PE at this time. No tachycardia, tachypnea or hypoxia. Doubt dissection.  ED PROGRESS: 6:30 AM  Pt now sleeping comfortably.  7:10 AM  Pt is still hemodynamically stable without complaints. Chest x-ray being read as possible patchy opacity in the right lung but she has no leukocytosis, fever or cough.  I will discharge her out of her on antibiotics given she is immunocompromised as I cannot fully exclude out pneumonia but at this time I do not feel she needs admission. She is anemic but this is chronic for her. No neutropenia. She has chronic kidney disease which is stable. Troponin is negative but again I doubt that this is ACS. We'll discharge with prescription for Levaquin. She has pain medication at home.  Had lengthy discussion with patient and family members. It does not appear she has been taking her OxyContin and oxycodone as prescribed. She states she cannot read well. We have labeled these bottles clearly and family states they will help her with her pain medication at home. Family is comfortable with the plan for discharge home. They state that they will follow-up with their oncologist.   At this time, I do not feel there is any life-threatening condition present. I have reviewed and discussed all results (EKG, imaging, lab, urine as appropriate) and exam findings with patient/family. I have reviewed nursing notes and appropriate previous records.  I feel the patient is safe to be discharged home without further emergent workup  and can continue workup as an outpatient as needed. Discussed usual and customary return precautions. Patient/family verbalize understanding and are comfortable with this plan.  Outpatient follow-up has been provided if needed. All questions have been answered.    Date: 02/12/2017 6:27 AM    Rate: 85  Rhythm: normal sinus rhythm  QRS Axis: normal  Intervals: normal  ST/T Wave abnormalities: normal  Conduction Disutrbances: none  Narrative Interpretation: unremarkable             Ward, Delice Bison, DO 02/12/17 [id number]

## 2017-02-13 ENCOUNTER — Ambulatory Visit: Payer: Medicare HMO | Admitting: Hematology

## 2017-02-13 ENCOUNTER — Other Ambulatory Visit: Payer: Medicare HMO

## 2017-02-13 ENCOUNTER — Ambulatory Visit: Payer: Medicare HMO

## 2017-02-14 ENCOUNTER — Ambulatory Visit: Payer: Medicare HMO

## 2017-02-17 ENCOUNTER — Ambulatory Visit: Payer: Medicare HMO

## 2017-02-18 ENCOUNTER — Telehealth: Payer: Self-pay | Admitting: Oncology

## 2017-02-18 ENCOUNTER — Ambulatory Visit: Payer: Medicare HMO

## 2017-02-18 ENCOUNTER — Telehealth: Payer: Self-pay | Admitting: Hematology

## 2017-02-18 NOTE — Telephone Encounter (Signed)
sw pt to confirm 9/7 appt at 230 pm per sch msg

## 2017-02-18 NOTE — Telephone Encounter (Signed)
Called [name] and asked how she is doing.  She said she is "going bad" and that her skin on her chest is red.  Asked if she would like to come in for radiation tomorrow or to just see the doctor. She said she would like to come in at 1:30 tomorrow to see Dr. Sondra Come.

## 2017-02-19 ENCOUNTER — Ambulatory Visit: Admission: RE | Admit: 2017-02-19 | Payer: Medicare HMO | Source: Ambulatory Visit

## 2017-02-19 ENCOUNTER — Ambulatory Visit: Payer: Medicare HMO

## 2017-02-19 ENCOUNTER — Encounter: Payer: Self-pay | Admitting: Radiation Oncology

## 2017-02-19 ENCOUNTER — Ambulatory Visit
Admission: RE | Admit: 2017-02-19 | Discharge: 2017-02-19 | Disposition: A | Discharge status: Home / Self Care | Payer: Medicare HMO | Source: Ambulatory Visit | Attending: Radiation Oncology | Admitting: Radiation Oncology

## 2017-02-19 VITALS — BP 134/63 | HR 86 | Temp 99.1°F

## 2017-02-19 DIAGNOSIS — C50911 Malignant neoplasm of unspecified site of right female breast: Secondary | ICD-10-CM

## 2017-02-19 DIAGNOSIS — C7981 Secondary malignant neoplasm of breast: Secondary | ICD-10-CM | POA: Diagnosis not present

## 2017-02-19 DIAGNOSIS — Z171 Estrogen receptor negative status [ER-]: Secondary | ICD-10-CM | POA: Diagnosis not present

## 2017-02-19 DIAGNOSIS — Z79899 Other long term (current) drug therapy: Secondary | ICD-10-CM | POA: Diagnosis not present

## 2017-02-19 DIAGNOSIS — Z9221 Personal history of antineoplastic chemotherapy: Secondary | ICD-10-CM | POA: Diagnosis not present

## 2017-02-19 DIAGNOSIS — Z510 Encounter for antineoplastic radiation therapy: Secondary | ICD-10-CM | POA: Diagnosis not present

## 2017-02-19 DIAGNOSIS — Z7982 Long term (current) use of aspirin: Secondary | ICD-10-CM | POA: Diagnosis not present

## 2017-02-19 DIAGNOSIS — N644 Mastodynia: Secondary | ICD-10-CM | POA: Diagnosis not present

## 2017-02-19 DIAGNOSIS — C773 Secondary and unspecified malignant neoplasm of axilla and upper limb lymph nodes: Secondary | ICD-10-CM | POA: Diagnosis not present

## 2017-02-19 MED ORDER — RADIAPLEXRX EX GEL
Freq: Once | CUTANEOUS | Status: AC
  Administered 2017-02-19: 15:00:00 via TOPICAL

## 2017-02-19 MED ORDER — OXYCODONE-ACETAMINOPHEN 5-325 MG PO TABS: 1.0000 | ORAL_TABLET | ORAL | 0 refills | Status: AC | PRN

## 2017-02-19 MED ORDER — OXYCODONE HCL ER 10 MG PO T12A: 10.0000 mg | EXTENDED_RELEASE_TABLET | Freq: Two times a day (BID) | ORAL | 0 refills | Status: AC

## 2017-02-19 MED FILL — OXYCODONE-ACETAMINOPHEN 5-3: 5-325 | 10 days supply | Qty: 60 | Fill #0

## 2017-02-19 MED FILL — OxyCONTIN 10 MG T12A: 10 | 30 days supply | Qty: 60 | Fill #0

## 2017-02-19 NOTE — Progress Notes (Signed)
  Radiation Oncology         [phone] ________________________________  Name: [name] MRN: [id number]  Date: 02/19/2017  DOB: 08-29-1945  Weekly Radiation Therapy Management    ICD-10-CM   1. 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 . . . . . . . The patient presents for routine under treatment assessment.                                 Patient reports having pain at an 8/10 today in both breasts with the right worse than the left. She is taking oxycodone 10 mg every 12 hours, percocet for break through and tramadol every 8 hours. She is now out of percocet. She reports she is very fatigued and can't get out of bed to do anything. She reports having nausea and vomiting every time she eats and thinks it is from taking levaquin. She is only eating small amounts of mashed potatoes and applesauce. She vomited today on arrival to the clinic. She also reports that food is burning her mouth and everything tastes bitter.   She reports that she has not had a bowel movement in more than a week. She reports abdominal bloating. She took miralax this morning. The skin on her chest is red with hyperpigmentation. She is using radiaplex and a refill has been provided. She did present to the emergency room recently with chest pain and some dyspnea.                                 Set-up films were reviewed.                                 The chart was checked. Physical Findings. . .  oral temperature is 99.1 F (37.3 C). Her blood pressure is 134/63 and her pulse is 86. Her oxygen saturation is 99%. . Weight essentially stable.  Presents in wheelchair. Lungs are clear to auscultation bilaterally. Heart has regular rate and rhythm. No palpable cervical, supraclavicular, or axillary adenopathy. Patient has some hyperpigmentation changes along both breasts. She has  erythematous nodules along the skin consistent with skin metastasis. No thrush noted. Both breasts essentially replaced by a rockhard tumor. Impression . . . . . . . The patient is tolerating radiation. She continues to feel that the radiation is causing her discomfort in both breasts.  I discussed with the patient and her husband that extensive tumor in both breasts is causing her pain. Plan . . . . . . . . . . . . The patient wants to hold off on restarting radiation treatment. She will meet with Dr. [name] this Friday. I have refilled her percocet and oxycodone today.   ________________________________   Blair Promise, PhD, MD  This document serves as a record of services personally performed by Gery Pray, MD. It was created on his behalf by Arlyce Harman, a trained medical scribe. The creation of this record is based on the scribe's personal observations and the provider's statements to them. This document has been checked and approved by the attending provider.

## 2017-02-19 NOTE — Progress Notes (Signed)
Patient reports having pain at an 8/10 today in both breasts with the right worse than the left.  She is taking oxycodone 10 mg q 12 hours, percocet for break thourgh and tramadol every 8 hours.  She is now out of percocet.  She reports she is very fatigued and can't get out of bed to do anything.  She reports having nausea and vomiting every time she eats and thinks it is from taking levaquin.  She is only eating small amounts of mashed potatoes and applesauce.  She vomited today on arrival to the clinic.  She also reports that food is burning her mouth and everything tastes bitter.  She does have some white spots on her tongue.   She reports that she has not had a bowel movement in more than a week.  She took miralax this morning.  The skin on her chest is red with hyperpigmentation.  She is using radiaplex and a refill has been provided.  BP 134/63 (BP Location: Left Arm, Patient Position: Sitting)   Pulse 86   Temp 99.1 F (37.3 C) (Oral)   SpO2 99%

## 2017-02-19 NOTE — Progress Notes (Signed)
[place]  [phone] [phone]  Clinic Follow Up Note   Patient Care Team: [name], MD as PCP - General (Internal Medicine) 02/21/2017   CHIEF COMPLAINTS:  Follow up right breast cancer   Oncology History   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 [facility])   Staging form: Breast, AJCC 7th Edition   - Clinical stage from 11/30/2015: Stage IV (T4d, N3c, M1) - Signed by Truitt Merle, MD on 01/05/2016      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 [facility])   11/30/2015 Initial Diagnosis    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 [facility])      11/30/2015 Initial Biopsy    Right axilla lymph node biopsy showed metastatic poorly differentiated adenocarcinoma, IHC positive for CK AE1/AE3, CK7, E-cadherin, CDX-2, and focally very weak to equivocal staining for ER      12/21/2015 Imaging    Bilateral breast MRI showed extensive non-mass enhancement involving the entire right breast, right axillary, supraclavicular, and internal mammary adenopathy, new left axillary adenopathy      12/27/2015 Mammogram    Diagnostic mammogram and ultrasound of right breast and axilla showed diffuse skin thickening and increased density, no focal mass, numerous enlarged right axillary lymph node, the largest measuring 5.3cm.       12/27/2015 Initial Biopsy    Right breast core needle biopsy showed invasive ductal carcinoma, and DCIS, lymphovascular invasion identified      12/27/2015 Receptors her2    ER negative, PR negative, Ki-67 70%, HER-2 negative      01/03/2016 Imaging    PET scan showed diffuse asymmetric right breast hypermetabolic soft tissue density and skin thickening, hypermetabolic metastatic lymphadenopathy in bilateral axillary and subpectoral regions, right supraclavicular region and the right cervical level 5      01/10/2016 - 05/28/2016 Chemotherapy    Weekly Taxol 80 mg/m, second cycle was  postponed to 8/22 due to pt's non-compliance, stopped due to disease progression. She developed mild peripheral neuropathy in early Dec 2017.      02/07/2016 Pathology Results    Left axillary lymph node biopsy showed metastatic poorly differentiated carcinoma, triple negative.      04/24/2016 Imaging    CT CHEST, ABDOMEN AND PELVIS WITH CONTRAST IMPRESSION: 1. Bulky right axillary and mild right retropectoral and right supraclavicular lymphadenopathy is mildly decreased since 01/03/2016 PET-CT. 2. Left axillary lymphadenopathy is mildly increased. 3. Diffuse asymmetric thickening of the skin and fibroglandular soft tissues of the right breast is not appreciably changed. 4. No definite additional sites of metastatic disease in the chest, abdomen or pelvis. 5. Subcentimeter right liver lobe lesion is too small to characterize and is probably stable. 6. Additional findings include aortic atherosclerosis, 1 vessel coronary atherosclerosis, tiny hiatal hernia and myomatous uterus.      06/05/2016 Imaging    PET scan showed progressive right breast mass, extending in size and metabolic activity. Enlarging bilateral axillary adenopathy likewise in increased activity. A right supraclavicular lymph node is slightly smaller than before, no other new metastasis.      06/21/2016 - 08/12/2016 Chemotherapy    Adriamycin 60 mg/m, and Cytoxan 600 mg/m, every 2-3 weeks, for a total of 4 cycles.       08/23/2016 PET scan    1. Response to therapy. Significant decrease in size and hypermetabolism of right breast mass and bilateral axillary nodal metastasis. Hypermetabolism within a right supraclavicular node has resolved.  2. New soft tissue fullness and hypermetabolism within the lateral left breast, suspicious for either metastatic disease or new primary. Mastitis could look similar. 3. No extrathoracic metastatic disease identified. 4.  Coronary artery atherosclerosis. Aortic atherosclerosis. 5.  Diffuse marrow hypermetabolism is likely related to stimulation by chemotherapy. This decreases sensitivity for osseous metastasis.      09/09/2016 -  Chemotherapy    Carboplatin AUC 2, and gemcitabine 1000 mg/m, on day 1 and 8 every 21 days, dose reduced from cycle 2 due to significant neutropenia and thrombocytopenia. Neulasta added on day 9 from cycle 2 and granix added day 2 cycle 2  Due to cancer progression change treatment to Eribulin infusion 2 weeks on and 1 week starting 6/21,  Eribulin held after 7/12 due to palliative radiation         11/27/2016 PET scan    PET 11/27/16 IMPRESSION: 1. Interval progression of disease. 2. There is increased hypermetabolic activity within bilateral axillary and right pectoral lymph nodes which are similar in overall size to the prior study. 3. There is also extensive hypermetabolic activity throughout both breasts which is nodular and progressive, concerning for local extension or inflammatory breast cancer. 4. New multifocal hypermetabolic osseous metastatic disease. 5. No evidence of metastatic disease to the lungs or within the abdomen or pelvis.       01/07/2017 Genetic Testing    Negative genetic testing on the common hereditary cancer panel.  The Hereditary Gene Panel offered by [facility] includes sequencing and/or deletion duplication testing of the following 46 genes: APC, ATM, AXIN2, BARD1, BMPR1A, BRCA1, BRCA2, BRIP1, CDH1, CDKN2A (p14ARF), CDKN2A (p16INK4a), CHEK2, CTNNA1, DICER1, EPCAM (Deletion/duplication testing only), GREM1 (promoter region deletion/duplication testing only), KIT, MEN1, MLH1, MSH2, MSH3, MSH6, MUTYH, NBN, NF1, NHTL1, PALB2, PDGFRA, PMS2, POLD1, POLE, PTEN, RAD50, RAD51C, RAD51D, SDHB, SDHC, SDHD, SMAD4, SMARCA4. STK11, TP53, TSC1, TSC2, and VHL.  The following genes were evaluated for sequence changes only: SDHA and HOXB13 c.251G>A variant only.  The report date is January 07, 2017.       02/03/2017 -  Radiation  Therapy    Palliative radiation with Dr.Kinard        HISTORY OF PRESENTING ILLNESS (12/14/2015) :  [name] 71 y.o. 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 She is accompanied by her husband to the clinic today.  She noticed a lump at right axilla in 08/2015, no pain or other complains, she also report right breast swelling, no pain, skin erythema, nipple discharge or other complain. She otherwise feels well. She was initially seen at urgent care in March, then was referred to establish her care with prior care physician at [facility] internal medicine center. Her screening mammogram was negative in January 2017. She subsequently underwent CT chest on 09/27/2015 which showed enlarged and inflamed right breast, bulky right axillary lymph nodes. She was referred to breast Center for diagnostic mammogram and ultrasound of right breast, which was negative. She underwent ultrasound guided right axillary node biopsy on 12/05/2015, which reviewed port differentiated carcinoma. ER weakly focally positive.  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  She works independently. She lives with her husband. She denies any fever, night sweats, chills, or skin itchiness.  CURRENT THERAPY: 4th line Eribulin infusion 2 weeks on and 1 week off, and Keytruda every 3 weeks, started 12/05/16, Eribulin held after 7/12 due to palliative radiation    INTERIM HISTORY:  Jeniffer returns for follow up. The patient is accompanied by her husband today.  She came to the clinic very tired. She reports she went to ED last night for constipation. She had taken miralax four times and  Doculax and milk of magnesia twice. Her last BM was over a week ago. She and husband reports to never getting through.  She went to radiation two days ago with 1/3 dose. The pain on radiation was improving but now that she does not go often she feels more pain and uncomfortable and no energy. She thinks her breast is leaking and sore.  She is taking pain medication, oxycodone, OxyContin, and percocetas prescribed.   She took Lactulose and she finished it. She reports nothing worked. She feels nauseous  She expertises swelling in her lower abdomen and she wears depends.  She is eating no more than 2 table spoons of food due to her having dysphagia  and she drinks 1 water bottle a day. She sleeps all the time.    MEDICAL HISTORY:  Past Medical History:  Diagnosis Date  . Arthritis   . Breast cancer ([place])    right  . Family history of breast cancer   . GERD (gastroesophageal reflux disease)   . Hypertension   . Shortness of breath   . Sleep apnea   . Stroke [facility])     SURGICAL HISTORY: Past Surgical History:  Procedure Laterality Date  . IR CV LINE INJECTION  11/28/2016  . IR GENERIC HISTORICAL  06/13/2016   IR US GUIDE VASC ACCESS LEFT 06/13/2016 Corrie Mckusick, DO [facility]-INTERV RAD  . IR GENERIC HISTORICAL  06/13/2016   IR FLUORO GUIDE PORT INSERTION LEFT 06/13/2016 Corrie Mckusick, DO [facility]-INTER[facility]  . NO PAST SURGERIES      SOCIAL HISTORY: Social History   Social History  . Marital status: Married    Spouse name: N/A  . Number of children: 4  . Years of education: 8 TH   Occupational History  . Not on file.   Social History Main Topics  . Smoking status: Former Smoker    Packs/day: 0.50    Years: 1.00    Types: Cigarettes    Start date: 06/17/1968    Quit date: 06/17/1973  . Smokeless tobacco: Never Used     Comment: Quit in 78s  . Alcohol use No     Comment: former drinker for approximately 10 years  . Drug use: No  . Sexual activity: Not on file   Other Topics Concern   . Not on file   Social History Narrative   Patient is married with 3 children still living and 1 deceased son.   Patient is right handed.   Patient has 8 th grade education.   Patient drinks 2-3 servings daily.    FAMILY HISTORY: Family History  Problem Relation Age of Onset  . Heart disease Mother   . Cancer Father 75       throat cancer  . Breast cancer Cousin        paternal first cousin dx in her late 70s-early 60s  . Cancer Sister        "cancer down below"  ALLERGIES:  has No Known Allergies.  MEDICATIONS:  Current Outpatient Prescriptions  Medication Sig Dispense Refill  . oxyCODONE (OXYCONTIN) 10 mg 12 hr tablet Take 1 tablet (10 mg total) by mouth every 12 (twelve) hours. 60 tablet 0  . oxyCODONE-acetaminophen (PERCOCET/ROXICET) 5-325 MG tablet Take 1 tablet by mouth every 4 (four) hours as needed for severe pain. 60 tablet 0  . polyethylene glycol (MIRALAX) packet Take 17 g by mouth daily. Start by taking it 4 times a day for the next 4 days. And then definitely continue once a day. 14 each 3  . sucralfate (CARAFATE) 1 GM/10ML suspension Take 10 mLs (1 g total) by mouth 4 (four) times daily -  with meals and at bedtime. 420 mL 0  . traMADol (ULTRAM) 50 MG tablet Take 1 tablet (50 mg total) by mouth every 8 (eight) hours as needed. 60 tablet 1  . amLODipine (NORVASC) 10 MG tablet Take 1 tablet (10 mg total) by mouth daily. (Patient not taking: Reported on 02/19/2017) 90 tablet 3  . aspirin EC 81 MG tablet Take 1 tablet (81 mg total) by mouth daily. (Patient not taking: Reported on 02/19/2017) 90 tablet 3  . atorvastatin (LIPITOR) 20 MG tablet TAKE 1 TABLET EVERY DAY (Patient not taking: Reported on 02/20/2017) 90 tablet 3  . lactulose (CHRONULAC) 10 GM/15ML solution Take 15-30 mLs (10-20 g total) by mouth every 8 (eight) hours as needed for mild constipation. (Patient not taking: Reported on 02/19/2017) 240 mL 0  . levofloxacin (LEVAQUIN) 500 MG tablet Take 1 tablet (500 mg  total) by mouth daily. (Patient not taking: Reported on 02/20/2017) 7 tablet 0  . lidocaine-prilocaine (EMLA) cream Apply to port @ 1 hour prior to treatment. (Patient not taking: Reported on 02/21/2017) 30 g 1  . metoprolol tartrate (LOPRESSOR) 25 MG tablet TAKE 1 TABLET EVERY DAY (Patient not taking: Reported on 02/21/2017) 90 tablet 3  . mirtazapine (REMERON) 15 MG tablet Take 1 tablet (15 mg total) by mouth at bedtime. (Patient not taking: Reported on 02/20/2017) 30 tablet 2  . ondansetron (ZOFRAN) 8 MG tablet Take by mouth every 8 (eight) hours as needed for nausea or vomiting.    . prochlorperazine (COMPAZINE) 10 MG tablet Take 10 mg by mouth every 6 (six) hours as needed for nausea or vomiting.    . ranitidine (ZANTAC) 150 MG capsule Take 1 capsule (150 mg total) by mouth daily. 10 capsule 0   No current facility-administered medications for this visit.    Facility-Administered Medications Ordered in Other Visits  Medication Dose Route Frequency Provider Last Rate Last Dose  . sodium chloride flush (NS) 0.9 % injection 10 mL  10 mL Intravenous PRN Truitt Merle, MD   10 mL at 08/12/16 1754  . sodium chloride flush (NS) 0.9 % injection 10 mL  10 mL Intravenous PRN Truitt Merle, MD   10 mL at 11/18/16 1516       REVIEW OF SYSTEMS:  Constitutional: Denies fevers, chills or abnormal night sweats  (+)very low appetite (+) trouble sleeping  Eyes: Denies blurriness of vision, double vision or watery eyes Ears, nose, mouth, throat, and face: Denies mucositis  Respiratory: Denies cough, dyspnea or wheezes (+) occasional chest pain Cardiovascular: Denies palpitation, chest discomfort  Gastrointestinal:  Denies nausea, heartburn  (+) severe constpation (+) nauseous  Skin: no skin rashes.  Lymphatics: Denies new lymphadenopathy or easy bruising Musculoskeletal: (+) difficulty ambulating Neurological: Negative Behavioral/Psych: Mood is stable, no new changes  Breast: (+)  pain/hardening in left breast,  worsened (+) right breast is hardened, worsened (+)10/10 pain in right breast All other systems were reviewed with the patient and are negative.   PHYSICAL EXAMINATION: ECOG PERFORMANCE STATUS: 1 Vitals:   02/21/17 1439  BP: 119/79  Pulse: 84  Resp: 18  Temp: 98.3 F (36.8 C)  SpO2: 100%  Weight: 173 lb (78.5 kg)  Height: _0  (1.626 m)    GENERAL:alert, no distress and comfortable SKIN: skin color, texture, turgor are normal, no rashes or significant lesions EYES: normal, conjunctiva are pink and non-injected, sclera clear OROPHARYNX:no exudate, no erythema and lips, buccal mucosa, and tongue normal  NECK: supple, thyroid normal size, non-tender, without nodularity LYMPH:  no palpable lymphadenopathy in the cervical, axillary or inguinal LUNGS: clear to auscultation and percussion with normal breathing effort HEART: regular rate & rhythm and no murmurs and no lower extremity edema ABDOMEN:abdomen soft, non-tender and normal bowel sounds (+)  Musculoskeletal:no cyanosis of digits and no clubbing  PSYCH: alert & oriented x 3 with fluent speech NEURO: Decreased vibration sensation on bilateral ankle and wrist. EXTREMITIES: Bilateral hand and lower extremity edema. Breasts: (+) her entire right breast is very firm., with associated skin edema, and there are multiple small areas in the low part of right breast which are erythematous, concerning for direct skin invasion from the tumor. (+) There is a 4X5 cm lymph node at the anterior right axilla, close to right chest wall of breast margin, no tenderness, movable.  (+) There is a orange size mass in central left breast, firm, and mildly tender. (+) There is a palpable 2 cm lymph node in the left axilla. (+) There are multiple new subcutaneous nodules below the right breast, which is highly suspicious for skin metastasis,  (+) few more skin ulcers on bottom of right breast, no discharge, tender   LABORATORY DATA:  I have reviewed the  data as listed CBC Latest Ref Rng & Units 02/21/2017 02/20/2017 02/12/2017  WBC 3.9 - 10.3 10e3/uL 3.6(L) 3.6(L) 4.3  Hemoglobin 11.6 - 15.9 g/dL 8.4(L) 8.2(L) 8.3(L)  Hematocrit 34.8 - 46.6 % 26.8(L) 27.0(L) 26.9(L)  Platelets 145 - 400 10e3/uL 241 254 246   CMP Latest Ref Rng & Units 02/21/2017 02/20/2017 02/12/2017  Glucose 70 - 140 mg/dl 89 85 103(H)  BUN 7.0 - 26.0 mg/dL 32.2(H) 35(H) 35(H)  Creatinine 0.6 - 1.1 mg/dL 1.7(H) 1.80(H) 1.51(H)  Sodium 136 - 145 mEq/L 137 136 138  Potassium 3.5 - 5.1 mEq/L 4.7 4.8 4.6  Chloride 101 - 111 mmol/L - 103 106  CO2 22 - 29 mEq/L _1 Calcium 8.4 - 10.4 mg/dL 9.2 8.6(L) 8.7(L)  Total Protein 6.4 - 8.3 g/dL 6.9 6.7 6.8  Total Bilirubin 0.20 - 1.20 mg/dL 0.45 0.5 0.4  Alkaline Phos 40 - 150 U/L 50 41 49  AST 5 - 34 U/L _2 ALT 0-55 U/L U/L <6 8(L) 11(L)    CA27.29:  01/05/2016: 155.9 02/27/2016: 96.7 03/26/2016: 64.7 04/23/2016: 65.4 05/21/2016: 99.2 07/15/2016: 215.4  08/12/2016: 176.9 09/09/2016: 112 10/07/16: 109.6 11/18/16: 251.6 12/05/2016: 239.8 12/26/2016: 291.8 01/30/17: 394.2 02/21/17: PENDING    PATHOLOGY REPORT: Diagnosis 11/30/2015 Lymph node, needle/core biopsy, right breast/axilla - LYMPH NODE POSITIVE FOR METASTATIC POORLY DIFFERENTIATED CARCINOMA. - SEE COMMENT. Microscopic Comment Immunohistochemical stains are performed. The tumor is positive for cytokeratin AE1/AE3, cytokeratin 7 and E-cadherin. CDX-2 demonstrates weak positivity. Estrogen receptor demonstrates focal very weak to equivocal staining. Gross cystic disease fluid protein,  cytokeratin 20, TTF-1, Melan-A and S100 are all negative. The findings are consistent with a poorly differentiated carcinoma. Given the staining pattern, an upper gastrointestinal and pancreatobiliary primary source should be ruled out. In addition, given the axillary location of the biopsy and focal weak to equivocal estrogen receptor staining, ruling out a poorly differentiated carcinoma  of breast primary source is also prudent. Lastly, a gynecologic primary may be considered. Correlation with clinical and radiologic impression is essential. Dr. Vicente Males has seen this case in consultation with essential agreement of the above diagnosis and comment. The findings are called to the [facility] on 12/04/15. (RAH:gt, 12/04/15)  Diagnosis 12/27/2015 Breast, right, needle core biopsy, central - INVASIVE MAMMARY CARCINOMA. - MAMMARY CARCINOMA IN SITU. - LYMPH/VASCULAR INVASION IS IDENTIFIED. - SEE COMMENT. Microscopic Comment An E-cadherin stain will be performed to determine if the carcinoma is ductal or lobular in nature. The results of the stain will be reported in an addendum to follow. Although definitive grading of breast carcinoma is best done on excision, the features of the invasive tumor from the right central breast biopsy are compatible with a grade 3 breast carcinoma. Breast prognostic markers will be performed and reported in an addendum. Findings are called to the [facility] on 12/28/2015. Dr. Lyndon Code has seen this case in consultation with agreement. (RH:kh 12-28-15) ADDENDUM: An E-cadherin immunohistochemical stain is performed which is positive in both the invasive and in situ components confirming the ductal nature of both (i.e. invasive ductal carcinoma with ductal carcinoma in situ). (RAH:gt, 12/29/15) PROGNOSTIC INDICATORS Results: IMMUNOHISTOCHEMICAL AND MORPHOMETRIC ANALYSIS PERFORMED MANUALLY Estrogen Receptor: 0%, NEGATIVE Progesterone Receptor: 0%, NEGATIVE Proliferation Marker Ki67: 70% Results: HER2 - NEGATIVE RATIO OF HER2/CEP17 SIGNALS 1.13 AVERAGE HER2 COPY NUMBER PER CELL 1.75  Diagnosis 02/07/2016 Lymph node, needle/core biopsy, left axillary - METASTATIC POORLY DIFFERENTIATED CARCINOMA. Results: IMMUNOHISTOCHEMICAL AND MORPHOMETRIC ANALYSIS PERFORMED MANUALLY Estrogen Receptor: 0%, NEGATIVE Progesterone  Receptor: 0%, NEGATIVE Results: HER2 - NEGATIVE RATIO OF HER2/CEP17 SIGNALS 1.21 AVERAGE HER2 COPY NUMBER PER CELL 2.05  RADIOGRAPHIC STUDIES: I have personally reviewed the radiological images as listed and agreed with the findings in the report.  PET 11/27/16 IMPRESSION: 1. Interval progression of disease. 2. There is increased hypermetabolic activity within bilateral axillary and right pectoral lymph nodes which are similar in overall size to the prior study. 3. There is also extensive hypermetabolic activity throughout both breasts which is nodular and progressive, concerning for local extension or inflammatory breast cancer. 4. New multifocal hypermetabolic osseous metastatic disease. 5. No evidence of metastatic disease to the lungs or within the abdomen or pelvis.  PET 08/23/2016 IMPRESSION: 1. Response to therapy. Significant decrease in size and hypermetabolism of right breast mass and bilateral axillary nodal metastasis. Hypermetabolism within a right supraclavicular node has resolved. 2. New soft tissue fullness and hypermetabolism within the lateral left breast, suspicious for either metastatic disease or new primary. Mastitis could look similar. 3. No extrathoracic metastatic disease identified. 4.  Coronary artery atherosclerosis. Aortic atherosclerosis. 5. Diffuse marrow hypermetabolism is likely related to stimulation by chemotherapy. This decreases sensitivity for osseous metastasis.  PET 06/05/2016 IMPRESSION: 1. Progressive right breast mass, expanding in size and metabolic activity. Enlarging but bilateral axillary adenopathy likewise increased activity. A right supraclavicular lymph node is slightly smaller than previous but has a higher standard uptake value. Overall appearance compatible with progression but no further new metastatic spread compared to 01/03/2016. 2. Coronary, aortic arch, and branch vessel atherosclerotic vascular disease. Aortoiliac  atherosclerotic vascular disease. 3. Extensive edema along the anterior chest wall especially both breasts, right greater than left.  ASSESSMENT & PLAN:  71 y.o. female, with past medical history of HTN, GERD, sleep apnea, presented with bulky right axillary adenopathy.  1. 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 Unfortunately her cancer has metastasized to left axilla and cervical lymph nodes, which are distant metastasis.  -We previously reviewed the natural history of metastatic triple negative breast cancer, which is very aggressive, and her cancer is incurable at this stage  -Giving the metastatic disease, surgery up front is not indicated, I recommended systemic chemotherapy. We previously discussed PET scan form 11/27/16 shows her cancer is progressing and has metastasized to both breast, lymph nodes and in parts of her bone.  -she is currently on 4th line chemo with Eribulin for 2 weeks on and 1 week off along with and immunotherapy drug Nat Math, based on the [place]. It is off label use.   -I previously tested her BRCA1/2 to see if she is a candidate for PARPA inhibitor, the test was done on 7/16/208, result is still pending  -she has started 4th line Eribulin and Keytruda  -Due to the concern of his of progression, and likely direct skin invasion, which can cause open wound, I have referred her to radiation oncology to consider palliative radiation to right breast, which she will start on 7/31  -She has been tolerating  chemotherapy poorly with significant pancytopenia. Eribulin was held after second dose on 12/26/16 due to poor tolerance and pending palliative radiation. -She was seen by a radiation oncologist Dr. Sondra Come and has started palliative of bilateral breast radiation, however patient requested to stop due to her worsening breast pain after 2 sessions, did restarted again, and stopped again this week due to severe pain and limited response  - I discussed that she has been through multiple lines of chemotherapy, with limited and short duration response. Her overall prognosis is very poor. -I think further radiation may also not help her much, I will discuss with Dr. Sondra Come again. -due to her worsening overall condition and poor performance  Status, lack of effective treatment options, I recommend hospice. I have discussed the service with patient and her husband, to help her manage her symptoms at home. She is open to the idea, will think about it over the weekend. -Labs reviewed and she is slightly anemic, 8.4, but does not need blood transfusion, Her Liver function has slightly worsened. I will give IVF today -Beryle Flock will be stopped due to disease progression  -I plan to call her next Monday for f/u, and likely will refer her to hospice   2. ANEMIA in neoplastic disease -She previously developed normocytic mild anemia in the past few months. -Her anemia workup previously showed a normal folic acid and I14 level, increased ferritin, decreased serum iron, TIBC and transferrin saturation. This is most consistent with anemia of chronic disease, secondary to her CKD and underlying malignancy.  -I previously encouraged her to continue oral iron supplement -Blood transfusion if hemoglobin less than 8.0.  3. CKD stage III -Her GFR was previously in 40-50's  -No lab signs of tumor lysis -Possibly related to her underlying hypertension and diuretics  -Her Cr has previously much improved since she stopped her  Lasix. -Cr increased to 1.6 as of 01/02/17, so she received IVF. -Avoid nephrotoxins, such as NSAIDs and IV CT contrast - Patient should stay well hydrated.  -Her Liver function has worsened slightly as of 02/21/17, I will give IVF today   4. HTN, GERD, obesity, sleep apnea -She'll continue follow-up with her primary care physician  5 Right breast pain -secondary to breast cancer, previously much improved after chemo, it has gotten much worse lately due to -For her worsening pain, I prescribe Oxycontin 62m pain medication to take 1-2 times a day and use low dose oxycodone as needed in between. I discussed that this can cause constipation So I will prescribe her lactulose to help.  -Her pain is 9-10/10 still so to better control her pain she will increase Oxycontine to 213mq12h.    6. Dizziness, anorexia and fatigue  -She has been quite fatigued, and complained of intermittent dizziness, especially when she stands up the week of 7/20 -secondary to chemotherapy. She does not have orthopedic hypotension, but I gave IVF previously.  -improved this week - The patient can ambulate around the house with help, but is scared of falling. I encouraged her to use a walker around the home as a precaution. - She is in a wheelchair in the clinic. She still has trouble ambulating  -For her appetite I will order Mirtazapine for her to take at night.   7. Severe constipation -Secondary to pain medication -She has used Miralx, milk of magnesia, dulcolax and lactulose.  -She went to ED for severe constipation, she had not gone for over a week -I suggest Magnesium citrate, use the whole bottle, afterward take lactulose every 2 hours until she has a bowel movement. As maintenance she will take lactulose 2-3 times a day to make sure she has a regular BM. I gave printed instructions.    8. Goal of care -we discussed the goal of care at can with patient and her husband in detail today, due to her lack of  response to chemotherapy and radiation, worsening performance status, I recommend hospice. She is open to this, ere discussed with her children this weekend.  PLAN -IVF today  -Stop Keytruda due to disease progression  -constipation management discussed with her in details -increase OxyContin to 2042m12h -Will call her to f/u her decision on hospice -I will discuss with Dr. KinSondra Come there is more benefit for continuing palliative RT  -F/u pending   I spent 35 minutes counseling the patient face to face. The total time spent in the appointment was 40 minutes and more than 50% was on counseling.  This document serves as a record of services personally performed by YanTruitt MerleD. It was created on her behalf by AmoJoslyn Devon trained medical scribe. The creation of this record is based on the scribe's personal observations and the provider's statements to them. This document has been checked and approved by the attending provider.   I have reviewed the above documentation for accuracy and completeness and I agree with the above.    FenTruitt MerleD 02/21/2017

## 2017-02-20 ENCOUNTER — Emergency Department (HOSPITAL_COMMUNITY): Payer: Medicare HMO

## 2017-02-20 ENCOUNTER — Encounter (HOSPITAL_COMMUNITY): Payer: Self-pay | Admitting: Emergency Medicine

## 2017-02-20 ENCOUNTER — Emergency Department (HOSPITAL_COMMUNITY)
Admission: EM | Admit: 2017-02-20 | Discharge: 2017-02-21 | Disposition: A | Discharge status: Home / Self Care | Payer: Medicare HMO | Attending: Emergency Medicine | Admitting: Emergency Medicine

## 2017-02-20 ENCOUNTER — Ambulatory Visit: Payer: Medicare HMO

## 2017-02-20 DIAGNOSIS — Z8673 Personal history of transient ischemic attack (TIA), and cerebral infarction without residual deficits: Secondary | ICD-10-CM | POA: Insufficient documentation

## 2017-02-20 DIAGNOSIS — Z87891 Personal history of nicotine dependence: Secondary | ICD-10-CM | POA: Insufficient documentation

## 2017-02-20 DIAGNOSIS — K5900 Constipation, unspecified: Secondary | ICD-10-CM | POA: Diagnosis not present

## 2017-02-20 DIAGNOSIS — C50911 Malignant neoplasm of unspecified site of right female breast: Secondary | ICD-10-CM | POA: Insufficient documentation

## 2017-02-20 DIAGNOSIS — I129 Hypertensive chronic kidney disease with stage 1 through stage 4 chronic kidney disease, or unspecified chronic kidney disease: Secondary | ICD-10-CM | POA: Insufficient documentation

## 2017-02-20 DIAGNOSIS — D259 Leiomyoma of uterus, unspecified: Secondary | ICD-10-CM | POA: Diagnosis not present

## 2017-02-20 DIAGNOSIS — N183 Chronic kidney disease, stage 3 (moderate): Secondary | ICD-10-CM | POA: Insufficient documentation

## 2017-02-20 DIAGNOSIS — Z79899 Other long term (current) drug therapy: Secondary | ICD-10-CM | POA: Insufficient documentation

## 2017-02-20 LAB — COMPREHENSIVE METABOLIC PANEL
ALK PHOS: 41 U/L (ref 38–126)
ALT: 8 U/L — AB (ref 14–54)
ANION GAP: 9 (ref 5–15)
AST: 21 U/L (ref 15–41)
Albumin: 3.4 g/dL — ABNORMAL LOW (ref 3.5–5.0)
BILIRUBIN TOTAL: 0.5 mg/dL (ref 0.3–1.2)
BUN: 35 mg/dL — ABNORMAL HIGH (ref 6–20)
CO2: 24 mmol/L (ref 22–32)
CREATININE: 1.8 mg/dL — AB (ref 0.44–1.00)
Calcium: 8.6 mg/dL — ABNORMAL LOW (ref 8.9–10.3)
Chloride: 103 mmol/L (ref 101–111)
GFR, EST AFRICAN AMERICAN: 32 mL/min — AB (ref >60)
GFR, EST NON AFRICAN AMERICAN: 27 mL/min — AB (ref >60)
Glucose, Bld: 85 mg/dL (ref 65–99)
Potassium: 4.8 mmol/L (ref 3.5–5.1)
Sodium: 136 mmol/L (ref 135–145)
TOTAL PROTEIN: 6.7 g/dL (ref 6.5–8.1)

## 2017-02-20 LAB — CBC WITH DIFFERENTIAL/PLATELET
BASOS PCT: 1 %
Basophils Absolute: 0 10*3/uL (ref 0.0–0.1)
EOS ABS: 0.1 10*3/uL (ref 0.0–0.7)
Eosinophils Relative: 2 %
HCT: 27 % — ABNORMAL LOW (ref 36.0–46.0)
HEMOGLOBIN: 8.2 g/dL — AB (ref 12.0–15.0)
Lymphocytes Relative: 16 %
Lymphs Abs: 0.6 10*3/uL — ABNORMAL LOW (ref 0.7–4.0)
MCH: 23 pg — ABNORMAL LOW (ref 26.0–34.0)
MCHC: 30.4 g/dL (ref 30.0–36.0)
MCV: 75.8 fL — ABNORMAL LOW (ref 78.0–100.0)
MONOS PCT: 10 %
Monocytes Absolute: 0.4 10*3/uL (ref 0.1–1.0)
NEUTROS PCT: 71 %
Neutro Abs: 2.5 10*3/uL (ref 1.7–7.7)
Platelets: 254 10*3/uL (ref 150–400)
RBC: 3.56 MIL/uL — AB (ref 3.87–5.11)
RDW: 18.5 % — ABNORMAL HIGH (ref 11.5–15.5)
WBC: 3.6 10*3/uL — AB (ref 4.0–10.5)

## 2017-02-20 LAB — LIPASE, BLOOD: Lipase: 14 U/L (ref 11–51)

## 2017-02-20 MED ORDER — IOPAMIDOL (ISOVUE-300) INJECTION 61%
100.0000 mL | Freq: Once | INTRAVENOUS | Status: AC | PRN
  Administered 2017-02-20: 80 mL via INTRAVENOUS

## 2017-02-20 MED ORDER — SODIUM CHLORIDE 0.9 % IV SOLN
INTRAVENOUS | Status: DC
  Administered 2017-02-20: 21:00:00 via INTRAVENOUS

## 2017-02-20 MED ORDER — POLYETHYLENE GLYCOL 3350 17 G PO PACK: 17.0000 g | PACK | Freq: Every day | ORAL | 3 refills | Status: AC

## 2017-02-20 MED ORDER — MAGNESIUM CITRATE PO SOLN: 1.0000 | Freq: Once | ORAL | 0 refills | Status: AC

## 2017-02-20 MED ORDER — SODIUM CHLORIDE 0.9 % IV BOLUS (SEPSIS)
250.0000 mL | Freq: Once | INTRAVENOUS | Status: AC
  Administered 2017-02-20: 250 mL via INTRAVENOUS

## 2017-02-20 MED ORDER — IOPAMIDOL (ISOVUE-300) INJECTION 61%
INTRAVENOUS | Status: AC
  Filled 2017-02-20: qty 100

## 2017-02-20 NOTE — ED Notes (Signed)
Patient requested an IV instead of accessing her port. Patient states her port is swollen and inflamed due to radiation. Successful IV was placed.

## 2017-02-20 NOTE — ED Provider Notes (Signed)
[facility] DEPT Provider Note   CSN: [id number] Arrival date & time: 02/20/17  1735     History   Chief Complaint Chief Complaint  Patient presents with  . Constipation    HPI [name] is a 71 y.o. female.  Patient has a history of metastatic breast cancer. Followed by hematology oncology. Patient is concerned that she is constipated. Patient states her abdomen is distended and she straining to have bowel movements. Denies any blood in the bowel movements. Patient's last chemotherapy was a week ago. Patient has follow-up with hematology oncology scheduled for tomorrow.Patient is on pain medication.      Past Medical History:  Diagnosis Date  . Arthritis   . Breast cancer ([place])    right  . Family history of breast cancer   . GERD (gastroesophageal reflux disease)   . Hypertension   . Shortness of breath   . Sleep apnea   . Stroke [facility])     Patient Active Problem List   Diagnosis Date Noted  . Genetic testing 01/09/2017  . Family history of breast cancer   . Port catheter in place 07/29/2016  . Anemia in neoplastic disease 01/05/2016  . 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 ([place]) 12/16/2015  . Difficulty walking 12/09/2015  . Swelling of both hands 12/09/2015  . Need for pneumococcal vaccine 10/18/2015  . OSA (obstructive sleep apnea) 07/14/2014  . Muscle spasm 05/08/2014  . Blurry vision, bilateral 04/22/2014  . Healthcare maintenance 04/22/2014  . HLD (hyperlipidemia) 04/08/2014  . History of stroke 02/03/2014  . Knee pain, bilateral 03/04/2012  . Cardiomegaly 03/04/2012  . Obesity (BMI 30-39.9) 03/04/2012  . CKD (chronic kidney disease) stage 3, GFR 30-59 ml/min 03/04/2012  . Sleep disturbance 03/04/2012  . Hemorrhoids 07/03/2006  . TOBACCO ABUSE, HX OF 07/03/2006  . Essential hypertension 04/30/2006  . GERD 04/30/2006  . LOW BACK PAIN 04/30/2006  . Hx of alcohol use 04/30/2006    Past Surgical History:  Procedure  Laterality Date  . IR CV LINE INJECTION  11/28/2016  . IR GENERIC HISTORICAL  06/13/2016   IR US GUIDE VASC ACCESS LEFT 06/13/2016 Corrie Mckusick, DO [facility]-INTERV RAD  . IR GENERIC HISTORICAL  06/13/2016   IR FLUORO GUIDE PORT INSERTION LEFT 06/13/2016 Corrie Mckusick, DO [facility]INTER[facility]  . NO PAST SURGERIES      OB History    No data available       Home Medications    Prior to Admission medications   Medication Sig Start Date End Date Taking? Authorizing Provider  lidocaine-prilocaine (EMLA) cream Apply to port @ 1 hour prior to treatment. 06/19/16  Yes Truitt Merle, MD  metoprolol tartrate (LOPRESSOR) 25 MG tablet TAKE 1 TABLET EVERY DAY 07/09/16  Yes [name], MD  ondansetron (ZOFRAN) 8 MG tablet Take by mouth every 8 (eight) hours as needed for nausea or vomiting.   Yes [provider]  oxyCODONE (OXYCONTIN) 10 mg 12 hr tablet Take 1 tablet (10 mg total) by mouth every 12 (twelve) hours. 02/19/17  Yes Gery Pray, MD  oxyCODONE-acetaminophen (PERCOCET/ROXICET) 5-325 MG tablet Take 1 tablet by mouth every 4 (four) hours as needed for severe pain. 02/19/17  Yes Gery Pray, MD  prochlorperazine (COMPAZINE) 10 MG tablet Take 10 mg by mouth every 6 (six) hours as needed for nausea or vomiting.   Yes [provider]  traMADol (ULTRAM) 50 MG tablet Take 1 tablet (50 mg total) by mouth every 8 (eight)  hours as needed. 12/30/16  Yes Gery Pray, MD  amLODipine (NORVASC) 10 MG tablet Take 1 tablet (10 mg total) by mouth daily. Patient not taking: Reported on 02/19/2017 09/25/16   [name], MD  aspirin EC 81 MG tablet Take 1 tablet (81 mg total) by mouth daily. Patient not taking: Reported on 02/19/2017 04/27/14   Rosalin Hawking, MD  atorvastatin (LIPITOR) 20 MG tablet TAKE 1 TABLET EVERY DAY Patient not taking: Reported on 02/20/2017 05/28/16   [name], MD  lactulose (CHRONULAC) 10 GM/15ML solution Take 15-30 mLs (10-20 g total) by mouth every 8 (eight) hours as needed for  mild constipation. Patient not taking: Reported on 02/19/2017 01/30/17   Truitt Merle, MD  levofloxacin (LEVAQUIN) 500 MG tablet Take 1 tablet (500 mg total) by mouth daily. Patient not taking: Reported on 02/20/2017 02/12/17   Ward, Delice Bison, DO  magnesium citrate SOLN Take 296 mLs (1 Bottle total) by mouth once. 02/20/17 02/20/17  Fredia Sorrow, MD  mirtazapine (REMERON) 15 MG tablet Take 1 tablet (15 mg total) by mouth at bedtime. Patient not taking: Reported on 02/20/2017 01/30/17   Truitt Merle, MD  polyethylene glycol [facility]) packet Take 17 g by mouth daily. Start by taking it 4 times a day for the next 4 days. And then definitely continue once a day. 02/20/17   Fredia Sorrow, MD  ranitidine (ZANTAC) 150 MG capsule Take 1 capsule (150 mg total) by mouth daily. 01/20/17 01/30/17  Duffy Bruce, MD  sucralfate (CARAFATE) 1 GM/10ML suspension Take 10 mLs (1 g total) by mouth 4 (four) times daily -  with meals and at bedtime. Patient not taking: Reported on 02/19/2017 01/20/17   Duffy Bruce, MD    Family History Family History  Problem Relation Age of Onset  . Heart disease Mother   . Cancer Father 60       throat cancer  . Breast cancer Cousin        paternal first cousin dx in her late 3s-early 60s  . Cancer Sister        "cancer down below"    Social History Social History  Substance Use Topics  . Smoking status: Former Smoker    Packs/day: 0.50    Years: 1.00    Types: Cigarettes    Start date: 06/17/1968    Quit date: 06/17/1973  . Smokeless tobacco: Never Used     Comment: Quit in 24s  . Alcohol use No     Comment: former drinker for approximately 10 years     Allergies   Patient has no known allergies.   Review of Systems Review of Systems  Constitutional: Negative for fever.  HENT: Negative for congestion.   Eyes: Negative for visual disturbance.  Respiratory: Negative for shortness of breath.   Cardiovascular: Negative for chest pain.  Gastrointestinal: Positive for  abdominal distention and abdominal pain. Negative for blood in stool, nausea and vomiting.  Genitourinary: Negative for dysuria.  Musculoskeletal: Negative for back pain.  Skin: Negative for rash.  Neurological: Negative for headaches.  Hematological: Does not bruise/bleed easily.  Psychiatric/Behavioral: Negative for confusion.     Physical Exam Updated Vital Signs BP 134/69 (BP Location: Right Arm)   Pulse 81   Temp 98.4 F (36.9 C) (Oral)   Resp 18   SpO2 94%   Physical Exam  Constitutional: She is oriented to person, place, and time. She appears well-developed and well-nourished.  HENT:  Head: Normocephalic and atraumatic.  Mouth/Throat: Oropharynx  is clear and moist.  Eyes: Pupils are equal, round, and reactive to light. Conjunctivae and EOM are normal.  Neck: Normal range of motion. Neck supple.  Cardiovascular: Normal rate and regular rhythm.   Pulmonary/Chest: Effort normal and breath sounds normal.  Abdominal: Soft. Bowel sounds are normal. She exhibits distension. There is no tenderness.  Musculoskeletal: Normal range of motion.  Neurological: She is alert and oriented to person, place, and time.  Skin: Skin is warm.  Nursing note and vitals reviewed.    ED Treatments / Results  Labs (all labs ordered are listed, but only abnormal results are displayed) Labs Reviewed  COMPREHENSIVE METABOLIC PANEL - Abnormal; Notable for the following:       Result Value   BUN 35 (*)    Creatinine, Ser 1.80 (*)    Calcium 8.6 (*)    Albumin 3.4 (*)    ALT 8 (*)    GFR calc non Af Amer 27 (*)    GFR calc Af Amer 32 (*)    All other components within normal limits  CBC WITH DIFFERENTIAL/PLATELET - Abnormal; Notable for the following:    WBC 3.6 (*)    RBC 3.56 (*)    Hemoglobin 8.2 (*)    HCT 27.0 (*)    MCV 75.8 (*)    MCH 23.0 (*)    RDW 18.5 (*)    Lymphs Abs 0.6 (*)    All other components within normal limits  LIPASE, BLOOD    EKG  EKG  Interpretation None       Radiology Ct Abdomen Pelvis W Contrast  Result Date: 02/20/2017 CLINICAL DATA:  Breast cancer, constipated EXAM: CT ABDOMEN AND PELVIS WITH CONTRAST TECHNIQUE: Multidetector CT imaging of the abdomen and pelvis was performed using the standard protocol following bolus administration of intravenous contrast. CONTRAST:  48mL ISOVUE-300 IOPAMIDOL (ISOVUE-300) INJECTION 61% COMPARISON:  PET CT 11/27/2016, CT abdomen pelvis 04/24/2016 FINDINGS: Lower chest: Large right breast mass, measuring 9.4 cm, incompletely visualized with overlying skin thickening and edema. Nipple retraction is present. Diffuse nodularity right breast and chest wall. Partially visualized nodule at the inferior aspect of the left breast. Heart size is borderline enlarged. Small bilateral right greater than left pleural effusions. Bronchiectasis in the right lower lobe with partial consolidation. Hepatobiliary: No focal hepatic abnormality. Dilated gallbladder. No calcified stones or biliary dilatation Pancreas: Unremarkable. No pancreatic ductal dilatation or surrounding inflammatory changes. Spleen: Normal in size without focal abnormality. Adrenals/Urinary Tract: Adrenal glands are unremarkable. Kidneys are normal, without renal calculi, focal lesion, or hydronephrosis. Bladder is unremarkable. Stomach/Bowel: Stomach is within normal limits. Appendix appears normal. No evidence of bowel wall thickening, distention, or inflammatory changes. Vascular/Lymphatic: Aortic atherosclerosis. Slight increase in size of small retroperitoneal lymph nodes. Reproductive: Uterine fibroids.  No adnexal masses. Other: Negative for free air. No ascites. Large amount of subcutaneous edema. Musculoskeletal: No acute osseous abnormality. Faint sclerosis in the left iliac bone corresponding to PET demonstrated skeletal metastatic focus. IMPRESSION: 1. Negative for a bowel obstruction or colon wall thickening. There is moderate stool  in the colon. 2. Large partially visualized right breast mass with diffuse infiltrative nodularity of the right breast and lower and lateral chest wall. Diffuse skin thickening with right nipple retraction. Partially visualized nodule inferior aspect of left breast. Findings consistent with known breast cancer. Slight increased retroperitoneal lymph nodes since the prior CT. Faint sclerotic lesion left iliac bone corresponding to pet CT metastatic focus. 3. Small bilateral effusions right greater than  left. Bronchiectasis in the right lower low with partial consolidation, possible infiltrate. 4. Dilated gallbladder without calcified stones or biliary dilatation 5. Anasarca 6. Fibroid uterus Electronically Signed   By: Donavan Foil M.D.   On: 02/20/2017 23:08    Procedures Procedures (including critical care time)  Medications Ordered in ED Medications  0.9 %  sodium chloride infusion ( Intravenous New Bag/Given 02/20/17 2116)  iopamidol (ISOVUE-300) 61 % injection (not administered)  sodium chloride 0.9 % bolus 250 mL (250 mLs Intravenous New Bag/Given 02/20/17 2116)  iopamidol (ISOVUE-300) 61 % injection 100 mL (80 mLs Intravenous Contrast Given 02/20/17 2226)     Initial Impression / Assessment and Plan / ED Course  I have reviewed the triage vital signs and the nursing notes.  Pertinent labs & imaging results that were available during my care of the patient were reviewed by me and considered in my medical decision making (see chart for details).     Patient nontoxic no acute distress. Patient's labs without significant abnormality. Slightly worse renal function. CT scan with contrast shows moderate constipation. No evidence of obstruction. No other complicating factors within the abdomen. Other findings are consistent with her of breast cancer.  Will treat patient with MiraLAX 4 times a day for the next 4 days. And a dose of magnesium citrate. And continuing MiraLAX daily. Patient will have  follow-up with hematology oncology tomorrow as scheduled, and they may want to modify this regiment. Patient stable for discharge home.  Final Clinical Impressions(s) / ED Diagnoses   Final diagnoses:  Constipation, unspecified constipation type  Malignant neoplasm of right female breast, unspecified estrogen receptor status, unspecified site of breast (HCC)    New Prescriptions New Prescriptions   MAGNESIUM CITRATE SOLN    Take 296 mLs (1 Bottle total) by mouth once.   POLYETHYLENE GLYCOL (MIRALAX) PACKET    Take 17 g by mouth daily. Start by taking it 4 times a day for the next 4 days. And then definitely continue once a day.     Fredia Sorrow, MD 02/20/17 [phone]

## 2017-02-20 NOTE — ED Triage Notes (Signed)
Pt reports she is constipated. Tried home treatments with no relief. LBM 11 days ago. Pt on treatment for breast cancer. Last chemo a week ago.

## 2017-02-20 NOTE — Discharge Instructions (Signed)
Keep your appointment with hematology oncology is scheduled for tomorrow. Start taking the MiraLAX 4 times a day at least for the next 4 days to help clear out the constipation. Also tomorrow when the convenient take the magnesium citrate as directed.would also recommend doing the MiraLAX daily for the forseable future.

## 2017-02-21 ENCOUNTER — Ambulatory Visit (HOSPITAL_BASED_OUTPATIENT_CLINIC_OR_DEPARTMENT_OTHER): Payer: Medicare HMO | Admitting: Hematology

## 2017-02-21 ENCOUNTER — Ambulatory Visit (HOSPITAL_BASED_OUTPATIENT_CLINIC_OR_DEPARTMENT_OTHER): Payer: Medicare HMO

## 2017-02-21 ENCOUNTER — Other Ambulatory Visit (HOSPITAL_BASED_OUTPATIENT_CLINIC_OR_DEPARTMENT_OTHER): Payer: Medicare HMO

## 2017-02-21 ENCOUNTER — Ambulatory Visit: Payer: Medicare HMO

## 2017-02-21 VITALS — BP 136/63 | HR 84 | Resp 18

## 2017-02-21 VITALS — BP 119/79 | HR 84 | Temp 98.3°F | Resp 18 | Ht 64.0 in | Wt 173.0 lb

## 2017-02-21 DIAGNOSIS — C50911 Malignant neoplasm of unspecified site of right female breast: Secondary | ICD-10-CM

## 2017-02-21 DIAGNOSIS — I959 Hypotension, unspecified: Secondary | ICD-10-CM

## 2017-02-21 DIAGNOSIS — D630 Anemia in neoplastic disease: Secondary | ICD-10-CM | POA: Diagnosis not present

## 2017-02-21 DIAGNOSIS — N183 Chronic kidney disease, stage 3 unspecified: Secondary | ICD-10-CM

## 2017-02-21 DIAGNOSIS — C773 Secondary and unspecified malignant neoplasm of axilla and upper limb lymph nodes: Secondary | ICD-10-CM | POA: Diagnosis not present

## 2017-02-21 DIAGNOSIS — G4733 Obstructive sleep apnea (adult) (pediatric): Secondary | ICD-10-CM | POA: Diagnosis not present

## 2017-02-21 DIAGNOSIS — E669 Obesity, unspecified: Secondary | ICD-10-CM

## 2017-02-21 DIAGNOSIS — E860 Dehydration: Secondary | ICD-10-CM

## 2017-02-21 LAB — COMPREHENSIVE METABOLIC PANEL
ALK PHOS: 50 U/L (ref 40–150)
ALT: <6 U/L (ref 0–55)
AST: 19 U/L (ref 5–34)
Albumin: 3.3 g/dL — ABNORMAL LOW (ref 3.5–5.0)
Anion Gap: 12 mEq/L — ABNORMAL HIGH (ref 3–11)
BILIRUBIN TOTAL: 0.45 mg/dL (ref 0.20–1.20)
BUN: 32.2 mg/dL — AB (ref 7.0–26.0)
CALCIUM: 9.2 mg/dL (ref 8.4–10.4)
CHLORIDE: 103 meq/L (ref 98–109)
CO2: 22 mEq/L (ref 22–29)
Creatinine: 1.7 mg/dL — ABNORMAL HIGH (ref 0.6–1.1)
EGFR: 34 mL/min/{1.73_m2} — ABNORMAL LOW (ref >90)
Glucose: 89 mg/dl (ref 70–140)
POTASSIUM: 4.7 meq/L (ref 3.5–5.1)
Sodium: 137 mEq/L (ref 136–145)
Total Protein: 6.9 g/dL (ref 6.4–8.3)

## 2017-02-21 LAB — CBC WITH DIFFERENTIAL/PLATELET
BASO%: 1.7 % (ref 0.0–2.0)
BASOS ABS: 0.1 10*3/uL (ref 0.0–0.1)
EOS%: 1.7 % (ref 0.0–7.0)
Eosinophils Absolute: 0.1 10*3/uL (ref 0.0–0.5)
HEMATOCRIT: 26.8 % — AB (ref 34.8–46.6)
HGB: 8.4 g/dL — ABNORMAL LOW (ref 11.6–15.9)
LYMPH%: 6 % — ABNORMAL LOW (ref 14.0–49.7)
MCH: 23.3 pg — AB (ref 25.1–34.0)
MCHC: 31.3 g/dL — ABNORMAL LOW (ref 31.5–36.0)
MCV: 74.5 fL — ABNORMAL LOW (ref 79.5–101.0)
MONO#: 0.6 10*3/uL (ref 0.1–0.9)
MONO%: 18 % — AB (ref 0.0–14.0)
NEUT#: 2.6 10*3/uL (ref 1.5–6.5)
NEUT%: 72.6 % (ref 38.4–76.8)
Platelets: 241 10*3/uL (ref 145–400)
RBC: 3.59 10*6/uL — AB (ref 3.70–5.45)
RDW: 20.1 % — ABNORMAL HIGH (ref 11.2–14.5)
WBC: 3.6 10*3/uL — ABNORMAL LOW (ref 3.9–10.3)
lymph#: 0.2 10*3/uL — ABNORMAL LOW (ref 0.9–3.3)

## 2017-02-21 MED ORDER — SODIUM CHLORIDE 0.9 % IV SOLN
Freq: Once | INTRAVENOUS | Status: AC
  Administered 2017-02-21: 17:00:00 via INTRAVENOUS

## 2017-02-21 MED ORDER — LACTULOSE 10 GM/15ML PO SOLN: 10.0000 g | ORAL | 2 refills | Status: AC | PRN

## 2017-02-21 NOTE — Patient Instructions (Signed)
Dehydration, Adult Dehydration is when there is not enough fluid or water in your body. This happens when you lose more fluids than you take in. Dehydration can range from mild to very bad. It should be treated right away to keep it from getting very bad. Symptoms of mild dehydration may include:  Thirst.  Dry lips.  Slightly dry mouth.  Dry, warm skin.  Dizziness. Symptoms of moderate dehydration may include:  Very dry mouth.  Muscle cramps.  Dark pee (urine). Pee may be the color of tea.  Your body making less pee.  Your eyes making fewer tears.  Heartbeat that is uneven or faster than normal (palpitations).  Headache.  Light-headedness, especially when you stand up from sitting.  Fainting (syncope). Symptoms of very bad dehydration may include:  Changes in skin, such as: ? Cold and clammy skin. ? Blotchy (mottled) or pale skin. ? Skin that does not quickly return to normal after being lightly pinched and let go (poor skin turgor).  Changes in body fluids, such as: ? Feeling very thirsty. ? Your eyes making fewer tears. ? Not sweating when body temperature is high, such as in hot weather. ? Your body making very little pee.  Changes in vital signs, such as: ? Weak pulse. ? Pulse that is more than 100 beats a minute when you are sitting still. ? Fast breathing. ? Low blood pressure.  Other changes, such as: ? Sunken eyes. ? Cold hands and feet. ? Confusion. ? Lack of energy (lethargy). ? Trouble waking up from sleep. ? Short-term weight loss. ? Unconsciousness. Follow these instructions at home:  If told by your doctor, drink an ORS: ? Make an ORS by using instructions on the package. ? Start by drinking small amounts, about  cup (120 mL) every 5-10 minutes. ? Slowly drink more until you have had the amount that your doctor said to have.  Drink enough clear fluid to keep your pee clear or pale yellow. If you were told to drink an ORS, finish the ORS  first, then start slowly drinking clear fluids. Drink fluids such as: ? Water. Do not drink only water by itself. Doing that can make the salt (sodium) level in your body get too low (hyponatremia). ? Ice chips. ? Fruit juice that you have added water to (diluted). ? Low-calorie sports drinks.  Avoid: ? Alcohol. ? Drinks that have a lot of sugar. These include high-calorie sports drinks, fruit juice that does not have water added, and soda. ? Caffeine. ? Foods that are greasy or have a lot of fat or sugar.  Take over-the-counter and prescription medicines only as told by your doctor.  Do not take salt tablets. Doing that can make the salt level in your body get too high (hypernatremia).  Eat foods that have minerals (electrolytes). Examples include bananas, oranges, potatoes, tomatoes, and spinach.  Keep all follow-up visits as told by your doctor. This is important. Contact a doctor if:  You have belly (abdominal) pain that: ? Gets worse. ? Stays in one area (localizes).  You have a rash.  You have a stiff neck.  You get angry or annoyed more easily than normal (irritability).  You are more sleepy than normal.  You have a harder time waking up than normal.  You feel: ? Weak. ? Dizzy. ? Very thirsty.  You have peed (urinated) only a small amount of very dark pee during 6-8 hours. Get help right away if:  You have symptoms of   very bad dehydration.  You cannot drink fluids without throwing up (vomiting).  Your symptoms get worse with treatment.  You have a fever.  You have a very bad headache.  You are throwing up or having watery poop (diarrhea) and it: ? Gets worse. ? Does not go away.  You have blood or something green (bile) in your throw-up.  You have blood in your poop (stool). This may cause poop to look black and tarry.  You have not peed in 6-8 hours.  You pass out (faint).  Your heart rate when you are sitting still is more than 100 beats a  minute.  You have trouble breathing. This information is not intended to replace advice given to you by your health care provider. Make sure you discuss any questions you have with your health care provider. Document Released: 03/30/2009 Document Revised: 12/22/2015 Document Reviewed: 07/28/2015 Elsevier Interactive Patient Education  2018 [employer]  

## 2017-02-22 ENCOUNTER — Encounter: Payer: Self-pay | Admitting: Hematology

## 2017-02-22 LAB — CANCER ANTIGEN 27.29: CA 27.29: 728 U/mL — ABNORMAL HIGH (ref 0.0–38.6)

## 2017-02-24 ENCOUNTER — Telehealth: Payer: Self-pay | Admitting: Hematology

## 2017-02-24 ENCOUNTER — Ambulatory Visit: Payer: Medicare HMO

## 2017-02-24 NOTE — Telephone Encounter (Signed)
Per 9/7 los - no los at check out

## 2017-02-25 ENCOUNTER — Ambulatory Visit: Payer: Medicare HMO

## 2017-02-26 ENCOUNTER — Ambulatory Visit: Payer: Medicare HMO

## 2017-02-27 ENCOUNTER — Encounter: Payer: Self-pay | Admitting: Radiation Oncology

## 2017-02-27 ENCOUNTER — Telehealth: Payer: Self-pay | Admitting: *Deleted

## 2017-02-27 ENCOUNTER — Ambulatory Visit: Payer: Medicare HMO

## 2017-02-27 NOTE — Progress Notes (Signed)
°  Radiation Oncology         [phone]) [phone] ________________________________  Name: [name] MRN: [id number]  Date: 02/27/2017  DOB: 18-May-1946  End of Treatment Note  Diagnosis:   71 y.o. 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      Indication for treatment:  Palliative   Radiation treatment dates:   01/14/17 - 02/11/17  Site/dose:    1. Right breast, 16.2 Gy in 9 fractions 2. Left breast, 16.2 Gy in 9 fractions  Only received 9 out of the 25 fractions, patient did not complete treatment.  Beams/energy:  Photon, 3D technique  6X/ 10X ,right breast 6X, left breast  Narrative: The patient completed only 9 out of the 25 fractions to her bilateral breast, receiving only 16.2 Gy. The patient reported having pain on both breast, the right breast being more painful then the left. Patient reported pain bein 10/10 when raising her arm for treatment on right breast. She reported fatigue, dizziness. Both breast were firm with redness and hyperpigmentaton, c/w with tumor infiltration. Plan: The patient has completed radiation treatment. The patient will return to radiation oncology clinic for routine followup in one month. I advised them to call or return sooner if they have any questions or concerns related to their recovery or treatment.  -----------------------------------  Blair Promise, PhD, MD  This document serves as a record of services personally performed by Gery Pray MD. It was created on his behalf by Delton Coombes, a trained medical scribe. The creation of this record is based on the scribe's personal observations and the provider's statements to them. This document has been checked and approved by the attending provider.

## 2017-02-27 NOTE — Telephone Encounter (Signed)
Attempted to call pt without answer and no voice mail available.  Called husband Jeneen Rinks on cell phone and left message on voice mail re:  What decision has pt made about Hospice.   Asked Jeneen Rinks to call office back to let nurse know if pt has agreed for hospice referral. Also to follow up about pt's constipation problem.

## 2017-02-28 ENCOUNTER — Ambulatory Visit: Payer: Medicare HMO

## 2017-02-28 ENCOUNTER — Telehealth: Payer: Self-pay | Admitting: *Deleted

## 2017-02-28 NOTE — Telephone Encounter (Signed)
Pt stated that she had not made a decision onf hospice care yet, that she needed to speak with 2 of her daughters.  When she has spoken with them she will have one of them give [place] a call.  She stated that the red medicine was relieving her of constipation.

## 2017-03-03 ENCOUNTER — Ambulatory Visit: Payer: Medicare HMO

## 2017-03-04 ENCOUNTER — Ambulatory Visit: Payer: Medicare HMO

## 2017-03-05 ENCOUNTER — Ambulatory Visit: Payer: Medicare HMO

## 2017-03-06 ENCOUNTER — Ambulatory Visit: Payer: Medicare HMO

## 2017-03-07 ENCOUNTER — Ambulatory Visit: Payer: Medicare HMO

## 2017-03-10 ENCOUNTER — Telehealth: Payer: Self-pay | Admitting: *Deleted

## 2017-03-10 ENCOUNTER — Ambulatory Visit: Payer: Medicare HMO

## 2017-03-10 NOTE — Telephone Encounter (Signed)
[name] [name] stated that her mother [name] has agreed to getting the  hospice service.

## 2017-03-10 NOTE — Telephone Encounter (Signed)
Please refer her to hospice, thanks  Truitt Merle MD

## 2017-03-11 ENCOUNTER — Ambulatory Visit: Payer: Medicare HMO

## 2017-03-12 ENCOUNTER — Ambulatory Visit: Payer: Medicare HMO

## 2017-03-12 NOTE — Telephone Encounter (Signed)
Spoke with Eliezer Lofts at [facility] and made pt's referral today.  Informed Eliezer Lofts that Dr. [name] will be the attending, activate hospice standing orders, and asked hospice physicians to assist with symptoms management for pt's comfort.  Jenna voiced understanding.

## 2017-03-15 ENCOUNTER — Emergency Department (HOSPITAL_COMMUNITY)

## 2017-03-15 ENCOUNTER — Encounter (HOSPITAL_COMMUNITY): Payer: Self-pay | Admitting: Emergency Medicine

## 2017-03-15 ENCOUNTER — Inpatient Hospital Stay (HOSPITAL_COMMUNITY)
Admission: EM | Admit: 2017-03-15 | Discharge: 2017-03-18 | DRG: 598 | Disposition: A | Discharge status: Hospice | Attending: Family Medicine | Admitting: Family Medicine

## 2017-03-15 DIAGNOSIS — R748 Abnormal levels of other serum enzymes: Secondary | ICD-10-CM | POA: Diagnosis present

## 2017-03-15 DIAGNOSIS — I248 Other forms of acute ischemic heart disease: Secondary | ICD-10-CM | POA: Diagnosis present

## 2017-03-15 DIAGNOSIS — R55 Syncope and collapse: Secondary | ICD-10-CM | POA: Diagnosis not present

## 2017-03-15 DIAGNOSIS — K5900 Constipation, unspecified: Secondary | ICD-10-CM | POA: Diagnosis present

## 2017-03-15 DIAGNOSIS — G4733 Obstructive sleep apnea (adult) (pediatric): Secondary | ICD-10-CM | POA: Diagnosis present

## 2017-03-15 DIAGNOSIS — Z808 Family history of malignant neoplasm of other organs or systems: Secondary | ICD-10-CM

## 2017-03-15 DIAGNOSIS — E785 Hyperlipidemia, unspecified: Secondary | ICD-10-CM | POA: Diagnosis present

## 2017-03-15 DIAGNOSIS — I10 Essential (primary) hypertension: Secondary | ICD-10-CM | POA: Diagnosis present

## 2017-03-15 DIAGNOSIS — J90 Pleural effusion, not elsewhere classified: Secondary | ICD-10-CM

## 2017-03-15 DIAGNOSIS — C7951 Secondary malignant neoplasm of bone: Secondary | ICD-10-CM | POA: Diagnosis present

## 2017-03-15 DIAGNOSIS — Z79891 Long term (current) use of opiate analgesic: Secondary | ICD-10-CM

## 2017-03-15 DIAGNOSIS — K219 Gastro-esophageal reflux disease without esophagitis: Secondary | ICD-10-CM | POA: Diagnosis present

## 2017-03-15 DIAGNOSIS — N179 Acute kidney failure, unspecified: Secondary | ICD-10-CM | POA: Diagnosis present

## 2017-03-15 DIAGNOSIS — R5383 Other fatigue: Secondary | ICD-10-CM

## 2017-03-15 DIAGNOSIS — R0602 Shortness of breath: Secondary | ICD-10-CM

## 2017-03-15 DIAGNOSIS — C50911 Malignant neoplasm of unspecified site of right female breast: Principal | ICD-10-CM | POA: Diagnosis present

## 2017-03-15 DIAGNOSIS — R590 Localized enlarged lymph nodes: Secondary | ICD-10-CM | POA: Diagnosis present

## 2017-03-15 DIAGNOSIS — Z8249 Family history of ischemic heart disease and other diseases of the circulatory system: Secondary | ICD-10-CM

## 2017-03-15 DIAGNOSIS — R404 Transient alteration of awareness: Secondary | ICD-10-CM | POA: Diagnosis not present

## 2017-03-15 DIAGNOSIS — Z79899 Other long term (current) drug therapy: Secondary | ICD-10-CM

## 2017-03-15 DIAGNOSIS — E875 Hyperkalemia: Secondary | ICD-10-CM | POA: Diagnosis present

## 2017-03-15 DIAGNOSIS — D630 Anemia in neoplastic disease: Secondary | ICD-10-CM | POA: Diagnosis present

## 2017-03-15 DIAGNOSIS — R531 Weakness: Secondary | ICD-10-CM | POA: Diagnosis not present

## 2017-03-15 DIAGNOSIS — C787 Secondary malignant neoplasm of liver and intrahepatic bile duct: Secondary | ICD-10-CM | POA: Diagnosis present

## 2017-03-15 DIAGNOSIS — J910 Malignant pleural effusion: Secondary | ICD-10-CM | POA: Diagnosis present

## 2017-03-15 DIAGNOSIS — R109 Unspecified abdominal pain: Secondary | ICD-10-CM | POA: Diagnosis present

## 2017-03-15 DIAGNOSIS — Z87891 Personal history of nicotine dependence: Secondary | ICD-10-CM

## 2017-03-15 DIAGNOSIS — Z8673 Personal history of transient ischemic attack (TIA), and cerebral infarction without residual deficits: Secondary | ICD-10-CM

## 2017-03-15 DIAGNOSIS — Z66 Do not resuscitate: Secondary | ICD-10-CM | POA: Diagnosis present

## 2017-03-15 DIAGNOSIS — Z803 Family history of malignant neoplasm of breast: Secondary | ICD-10-CM

## 2017-03-15 DIAGNOSIS — Z9889 Other specified postprocedural states: Secondary | ICD-10-CM

## 2017-03-15 LAB — BASIC METABOLIC PANEL
ANION GAP: 14 (ref 5–15)
BUN: 46 mg/dL — ABNORMAL HIGH (ref 6–20)
CALCIUM: 9.2 mg/dL (ref 8.9–10.3)
CO2: 21 mmol/L — AB (ref 22–32)
Chloride: 101 mmol/L (ref 101–111)
Creatinine, Ser: 2.57 mg/dL — ABNORMAL HIGH (ref 0.44–1.00)
GFR calc non Af Amer: 18 mL/min — ABNORMAL LOW (ref >60)
GFR, EST AFRICAN AMERICAN: 21 mL/min — AB (ref >60)
Glucose, Bld: 91 mg/dL (ref 65–99)
POTASSIUM: 5.8 mmol/L — AB (ref 3.5–5.1)
Sodium: 136 mmol/L (ref 135–145)

## 2017-03-15 LAB — CBC
HEMATOCRIT: 31.7 % — AB (ref 36.0–46.0)
HEMOGLOBIN: 9.6 g/dL — AB (ref 12.0–15.0)
MCH: 22.5 pg — ABNORMAL LOW (ref 26.0–34.0)
MCHC: 30.3 g/dL (ref 30.0–36.0)
MCV: 74.2 fL — AB (ref 78.0–100.0)
Platelets: 268 10*3/uL (ref 150–400)
RBC: 4.27 MIL/uL (ref 3.87–5.11)
RDW: 19.1 % — ABNORMAL HIGH (ref 11.5–15.5)
WBC: 5.3 10*3/uL (ref 4.0–10.5)

## 2017-03-15 LAB — URINALYSIS, ROUTINE W REFLEX MICROSCOPIC
BILIRUBIN URINE: NEGATIVE
Glucose, UA: NEGATIVE mg/dL
HGB URINE DIPSTICK: NEGATIVE
KETONES UR: 5 mg/dL — AB
NITRITE: NEGATIVE
PH: 5 (ref 5.0–8.0)
Protein, ur: NEGATIVE mg/dL
SPECIFIC GRAVITY, URINE: 1.016 (ref 1.005–1.030)

## 2017-03-15 LAB — I-STAT TROPONIN, ED: Troponin i, poc: 0.02 ng/mL (ref 0.00–0.08)

## 2017-03-15 MED ORDER — SODIUM CHLORIDE 0.9% FLUSH
3.0000 mL | Freq: Two times a day (BID) | INTRAVENOUS | Status: DC
  Administered 2017-03-15 – 2017-03-16 (×3): 3 mL via INTRAVENOUS

## 2017-03-15 MED ORDER — IPRATROPIUM-ALBUTEROL 0.5-2.5 (3) MG/3ML IN SOLN
3.0000 mL | Freq: Once | RESPIRATORY_TRACT | Status: AC
  Administered 2017-03-15: 3 mL via RESPIRATORY_TRACT
  Filled 2017-03-15: qty 3

## 2017-03-15 MED ORDER — ONDANSETRON HCL 4 MG/2ML IJ SOLN
4.0000 mg | Freq: Four times a day (QID) | INTRAMUSCULAR | Status: DC | PRN
  Administered 2017-03-15 – 2017-03-16 (×2): 4 mg via INTRAVENOUS
  Filled 2017-03-15: qty 2

## 2017-03-15 MED ORDER — ALBUTEROL SULFATE (2.5 MG/3ML) 0.083% IN NEBU: 5.0000 mg | INHALATION_SOLUTION | Freq: Once | RESPIRATORY_TRACT | Status: DC

## 2017-03-15 MED ORDER — OXYCODONE-ACETAMINOPHEN 5-325 MG PO TABS
1.0000 | ORAL_TABLET | Freq: Once | ORAL | Status: AC
  Administered 2017-03-15: 1 via ORAL
  Filled 2017-03-15: qty 1

## 2017-03-15 MED ORDER — LIDOCAINE HCL 2 % EX GEL
1.0000 "application " | Freq: Once | CUTANEOUS | Status: DC
  Filled 2017-03-15: qty 11

## 2017-03-15 MED ORDER — HEPARIN SODIUM (PORCINE) 5000 UNIT/ML IJ SOLN
5000.0000 [IU] | Freq: Three times a day (TID) | INTRAMUSCULAR | Status: DC
  Administered 2017-03-16 – 2017-03-18 (×7): 5000 [IU] via SUBCUTANEOUS
  Filled 2017-03-15 (×7): qty 1

## 2017-03-15 MED ORDER — HYDROMORPHONE HCL 1 MG/ML IJ SOLN
1.0000 mg | Freq: Once | INTRAMUSCULAR | Status: AC
  Administered 2017-03-15: 1 mg via INTRAVENOUS
  Filled 2017-03-15: qty 1

## 2017-03-15 MED ORDER — SODIUM CHLORIDE 0.9 % IV BOLUS (SEPSIS)
1000.0000 mL | Freq: Once | INTRAVENOUS | Status: AC
  Administered 2017-03-15: 1000 mL via INTRAVENOUS

## 2017-03-15 MED ORDER — HEPARIN SODIUM (PORCINE) 5000 UNIT/ML IJ SOLN: 5000.0000 [IU] | Freq: Three times a day (TID) | INTRAMUSCULAR | Status: DC

## 2017-03-15 MED ORDER — SODIUM CHLORIDE 0.9 % IV SOLN
INTRAVENOUS | Status: AC
  Administered 2017-03-15: 22:00:00 via INTRAVENOUS

## 2017-03-15 NOTE — ED Provider Notes (Signed)
[facility] DEPT Provider Note   CSN: [id number] Arrival date & time: 03/15/17  1151     History   Chief Complaint Chief Complaint  Patient presents with  . Near Syncope    HPI  [name] is a [name] with a history of stage 4 metastatic breat cancer, previous stroke, and HTN, who presents today with a near-syncopal episode while she was straining to use the bathroom, and afterwards she just didn't feel well. Pt reports she just felt very light-headed, no syncope or falls. Pt reports she has not had a bowel movement in > 10 days, reports some generalized abdominal discomfort, no nausea or vomiting. Pt has a round of radiation recently and reports a lot of chest pain from this, she reports burning pain and tightness of the chest, with associated SOB, pt feels like she can't take a deep breath, these symptoms seem to be worse than usual today. No lower extrmeity swelling or pain. Pt denies any fevers or chills, pt does reports some burning with urination today. Denies headaches or vision changes, no numbness or weakness of extremities. Hospice care nurse was at the home with her today.      Past Medical History:  Diagnosis Date  . Arthritis   . Breast cancer ([place])    right  . Family history of breast cancer   . GERD (gastroesophageal reflux disease)   . Hypertension   . Shortness of breath   . Sleep apnea   . Stroke [facility])     Patient Active Problem List   Diagnosis Date Noted  . Genetic testing 01/09/2017  . Family history of breast cancer   . Port catheter in place 07/29/2016  . Anemia in neoplastic disease 01/05/2016  . 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 ([place]) 12/16/2015  . Difficulty walking 12/09/2015  . Swelling of both hands 12/09/2015  . Need for pneumococcal vaccine 10/18/2015  . OSA (obstructive sleep apnea) 07/14/2014  . Muscle spasm 05/08/2014  . Blurry vision, bilateral 04/22/2014  . Healthcare maintenance  04/22/2014  . HLD (hyperlipidemia) 04/08/2014  . History of stroke 02/03/2014  . Knee pain, bilateral 03/04/2012  . Cardiomegaly 03/04/2012  . Obesity (BMI 30-39.9) 03/04/2012  . CKD (chronic kidney disease) stage 3, GFR 30-59 ml/min 03/04/2012  . Sleep disturbance 03/04/2012  . Hemorrhoids 07/03/2006  . TOBACCO ABUSE, HX OF 07/03/2006  . Essential hypertension 04/30/2006  . GERD 04/30/2006  . LOW BACK PAIN 04/30/2006  . Hx of alcohol use 04/30/2006    Past Surgical History:  Procedure Laterality Date  . IR CV LINE INJECTION  11/28/2016  . IR GENERIC HISTORICAL  06/13/2016   IR US GUIDE VASC ACCESS LEFT 06/13/2016 Corrie Mckusick, DO [facility]-INTERV RAD  . IR GENERIC HISTORICAL  06/13/2016   IR FLUORO GUIDE PORT INSERTION LEFT 06/13/2016 Corrie Mckusick, DO [facility]-INTERV RAD  . NO PAST SURGERIES      OB History    No data available       Home Medications    Prior to Admission medications   Medication Sig Start Date End Date Taking? Authorizing Provider  lactulose (CHRONULAC) 10 GM/15ML solution Take 15-30 mLs (10-20 g total) by mouth every 4 (four) hours as needed for mild constipation. 02/21/17  Yes Truitt Merle, MD  lidocaine-prilocaine (EMLA) cream Apply to port @ 1 hour prior to treatment. 06/19/16  Yes Truitt Merle, MD  metoprolol tartrate (LOPRESSOR) 25 MG tablet TAKE 1 TABLET EVERY DAY 07/09/16  Yes [name], MD  ondansetron (ZOFRAN) 8 MG tablet Take by mouth every 8 (eight) hours as needed for nausea or vomiting.   Yes [provider]  oxyCODONE (OXYCONTIN) 10 mg 12 hr tablet Take 1 tablet (10 mg total) by mouth every 12 (twelve) hours. 02/19/17  Yes Gery Pray, MD  oxyCODONE-acetaminophen (PERCOCET/ROXICET) 5-325 MG tablet Take 1 tablet by mouth every 4 (four) hours as needed for severe pain. 02/19/17  Yes Gery Pray, MD  polyethylene glycol [facility]) packet Take 17 g by mouth daily. Start by taking it 4 times a day for the next 4 days. And then definitely continue once a  day. 02/20/17  Yes Fredia Sorrow, MD  prochlorperazine (COMPAZINE) 10 MG tablet Take 10 mg by mouth every 6 (six) hours as needed for nausea or vomiting.   Yes [provider]  sucralfate (CARAFATE) 1 GM/10ML suspension Take 10 mLs (1 g total) by mouth 4 (four) times daily -  with meals and at bedtime. 01/20/17  Yes Duffy Bruce, MD  traMADol (ULTRAM) 50 MG tablet Take 1 tablet (50 mg total) by mouth every 8 (eight) hours as needed. 12/30/16  Yes Gery Pray, MD  amLODipine (NORVASC) 10 MG tablet Take 1 tablet (10 mg total) by mouth daily. Patient not taking: Reported on 02/19/2017 09/25/16   [name], MD  aspirin EC 81 MG tablet Take 1 tablet (81 mg total) by mouth daily. Patient not taking: Reported on 02/19/2017 04/27/14   Rosalin Hawking, MD  atorvastatin (LIPITOR) 20 MG tablet TAKE 1 TABLET EVERY DAY Patient not taking: Reported on 02/20/2017 05/28/16   [name], MD  levofloxacin (LEVAQUIN) 500 MG tablet Take 1 tablet (500 mg total) by mouth daily. Patient not taking: Reported on 02/20/2017 02/12/17   Ward, Delice Bison, DO  mirtazapine (REMERON) 15 MG tablet Take 1 tablet (15 mg total) by mouth at bedtime. Patient not taking: Reported on 02/20/2017 01/30/17   Truitt Merle, MD  ranitidine (ZANTAC) 150 MG capsule Take 1 capsule (150 mg total) by mouth daily. 01/20/17 01/30/17  Duffy Bruce, MD    Family History Family History  Problem Relation Age of Onset  . Heart disease Mother   . Cancer Father 33       throat cancer  . Breast cancer Cousin        paternal first cousin dx in her late 14s-early 60s  . Cancer Sister        "cancer down below"    Social History Social History  Substance Use Topics  . Smoking status: Former Smoker    Packs/day: 0.50    Years: 1.00    Types: Cigarettes    Start date: 06/17/1968    Quit date: 06/17/1973  . Smokeless tobacco: Never Used     Comment: Quit in 54s  . Alcohol use No     Comment: former drinker for approximately 10 years      Allergies   Patient has no known allergies.   Review of Systems Review of Systems  Constitutional: Negative for chills and fever.  HENT: Negative for congestion, rhinorrhea and sore throat.   Eyes: Negative for visual disturbance.  Respiratory: Positive for chest tightness and shortness of breath. Negative for cough.   Cardiovascular: Positive for chest pain. Negative for palpitations and leg swelling.  Gastrointestinal: Positive for abdominal pain and constipation. Negative for blood in stool, diarrhea, nausea and vomiting.  Genitourinary: Positive for dysuria.  Musculoskeletal: Negative for back pain.  Skin: Negative for pallor and rash.  Neurological: Positive for dizziness and light-headedness. Negative for facial asymmetry, speech difficulty, weakness, numbness and headaches.     Physical Exam Updated Vital Signs BP (!) 161/87 (BP Location: Left Arm)   Pulse 93   Temp 98.3 F (36.8 C) (Oral)   Resp (!) 26   SpO2 98%   Physical Exam  Constitutional: She appears well-developed and well-nourished. No distress.  HENT:  Head: Normocephalic and atraumatic.  Eyes: Pupils are equal, round, and reactive to light. EOM are normal. Right eye exhibits no discharge. Left eye exhibits no discharge.  Neck: Neck supple.  Cardiovascular: Normal rate, regular rhythm, normal heart sounds and intact distal pulses.   Pulmonary/Chest: Effort normal. No respiratory distress. She exhibits tenderness.  Breath sounds diminished on the right, no wheezes or rales, no evidence of respiratory distress  Abdominal: Soft. Bowel sounds are normal. There is tenderness.  Abdomen soft, BS X 4, no masses, tenderness throughout abdomen, minimal guarding, neg Murphys, non-tender at McBurney's point, no rebound  Musculoskeletal: She exhibits no edema or deformity.  Neurological: She is alert. Coordination normal.  Speech is clear, able to follow commands CN III-XII intact Normal strength in upper and  lower extremities bilaterally including dorsiflexion and plantar flexion, strong and equal grip strength Sensation normal to light and sharp touch Moves extremities without ataxia, coordination intact  Skin: Skin is warm and dry. Capillary refill takes less than 2 seconds. She is not diaphoretic.  Erythema of chest and bilateral breasts with minimal drainage consistent with radiation changes, no evidence of cellulitis  Psychiatric: She has a normal mood and affect. Her behavior is normal.  Nursing note and vitals reviewed.    ED Treatments / Results  Labs (all labs ordered are listed, but only abnormal results are displayed) Labs Reviewed  BASIC METABOLIC PANEL - Abnormal; Notable for the following:       Result Value   Potassium 5.8 (*)    CO2 21 (*)    BUN 46 (*)    Creatinine, Ser 2.57 (*)    GFR calc non Af Amer 18 (*)    GFR calc Af Amer 21 (*)    All other components within normal limits  CBC - Abnormal; Notable for the following:    Hemoglobin 9.6 (*)    HCT 31.7 (*)    MCV 74.2 (*)    MCH 22.5 (*)    RDW 19.1 (*)    All other components within normal limits  URINALYSIS, ROUTINE W REFLEX MICROSCOPIC  I-STAT TROPONIN, ED    EKG  EKG Interpretation None       Radiology Dg Chest 2 View  Result Date: 03/15/2017 CLINICAL DATA:  Near syncopal episode at home today. Stage IV breast cancer with bone metastases. Ex-smoker. EXAM: CHEST  2 VIEW COMPARISON:  02/12/2017.  PET-CT dated 11/27/2016. FINDINGS: Interval large right pleural effusion and moderate left pleural effusion with bilateral airspace opacity. Grossly stable heart size and tortuous aorta. The left jugular porta catheter tip remains in the superior vena cava. Thoracic spine degenerative changes. IMPRESSION: 1. Interval large right pleural effusion and moderate-sized left pleural effusion. 2. Interval bilateral atelectasis. Underlying pneumonia cannot be excluded on either side. Electronically Signed   By: Claudie Revering M.D.   On: 03/15/2017 14:37    Procedures Procedures (including critical care time)  Medications Ordered in ED Medications  sodium chloride 0.9 % bolus 1,000 mL (1,000 mLs Intravenous New Bag/Given 03/15/17 1618)  oxyCODONE-acetaminophen (PERCOCET/ROXICET) 5-325 MG per tablet 1 tablet (1 tablet Oral Given 03/15/17 1618)  ipratropium-albuterol (DUONEB) 0.5-2.5 (3) MG/3ML nebulizer solution 3 mL (3 mLs Nebulization Given 03/15/17 1557)     Initial Impression / Assessment and Plan / ED Course  I have reviewed the triage vital signs and the nursing notes.  Pertinent labs & imaging results that were available during my care of the patient were reviewed by me and considered in my medical decision making (see chart for details).  Pt present with near syncopal episode, CP and shortness of breath worse than usual. Pt mildly hypertensive and intermittently tachypneic, vitals otherwise normal. EKG with no remarkable changes from previous, trop negative. Lab significant for AKI and hyperkalemia to 5.8, no associated EKG changes. Fluids and albuterol Neb given. No leukocytosis, Hgb improved from previous.  Diminished lung sounds on the right, CXR shows large pleural effusion on the right, and moderate on the left (effusions likely due to metastatic cancer and radiation), bilateral atelectasis, PNA cannot be excluded, given these findings will obtain a CT chest, pt also at risk for PE given cancer hx and worsening SOB, but cannot receive contrast with AKI. Pt not a candidate for V/Q with pleural effusions. Given abdominal tenderness and no bowel movement in > 10 days, will obtain Abd CT to assess for obstruction.  Pt will need to be admitted for AKI, At shift change care was transferred to [facility], PA-C who will follow pending studies, re-evaulate and determine if any other consults or interventions are needed before admission.    Patient discussed with Dr. Sherry Ruffing, who saw patient as well and  agrees with plan.   Final Clinical Impressions(s) / ED Diagnoses   Final diagnoses:  AKI (acute kidney injury) ([place])  Near syncope  Shortness of breath    New Prescriptions New Prescriptions   No medications on file     Janet Berlin 03/15/17 1831    Tegeler, Gwenyth Allegra, MD 03/17/17 [phone]

## 2017-03-15 NOTE — Progress Notes (Signed)
[facility] Liaison: RN  Spoke with Dr. Sherry Ruffing regarding patient.  They will be running some tests to confirm that nothing is going on in chest and no SBO.  Symptoms that patient has presented in the ED are not consistent with the information she gave [facility] RN that went to patient's home today.   [facility] RN was told that she had been having chest pain.  Per ED note, patient is now stating she had near syncopal event at home after straining to have a bowel movement.  Patient also told them that she has had trouble with bowel movements x 2 weeks.    I will continue to monitor patient while she is hospitalized.  Thank you,  Edyth Gunnels, RN, BSN [facility] Liaison [phone]  All hospital liaisons are now on [place].

## 2017-03-15 NOTE — ED Provider Notes (Signed)
Physical Exam  BP (!) 148/93 (BP Location: Right Arm)   Pulse 91   Temp 98.3 F (36.8 C) (Oral)   Resp 20   SpO2 100%   Assumed care from Benedetto Goad, PA-C at [phone]. Briefly, the patient is a 71 y.o. female with PMHx of  has a past medical history of Arthritis; Breast cancer ([place]); Family history of breast cancer; GERD (gastroesophageal reflux disease); Hypertension; Shortness of breath; Sleep apnea; and Stroke ([place]). Here with a syncopal episode that occurred while straining on the toilet, no BM x 11 days and increasing SOB. Patient did not hit her head or sustain any trauma in this syncopal episode. Patient noted to have bilateral pleural effusions on chest x-ray. AK I noted on lab work. Potassium 5.8 and no EKG changes.   Labs Reviewed  BASIC METABOLIC PANEL - Abnormal; Notable for the following:       Result Value   Potassium 5.8 (*)    CO2 21 (*)    BUN 46 (*)    Creatinine, Ser 2.57 (*)    GFR calc non Af Amer 18 (*)    GFR calc Af Amer 21 (*)    All other components within normal limits  CBC - Abnormal; Notable for the following:    Hemoglobin 9.6 (*)    HCT 31.7 (*)    MCV 74.2 (*)    MCH 22.5 (*)    RDW 19.1 (*)    All other components within normal limits  URINALYSIS, ROUTINE W REFLEX MICROSCOPIC  I-STAT TROPONIN, ED    Course of Care:  Physical Exam  Constitutional: She appears well-developed and well-nourished. No distress.  Sitting comfortably in bed.  HENT:  Head: Normocephalic and atraumatic.  Eyes: Conjunctivae are normal. Right eye exhibits no discharge. Left eye exhibits no discharge.  EOMs normal to gross examination.  Neck: Normal range of motion.  Cardiovascular: Normal rate and regular rhythm.   Intact, 2+ radial pulse.  Pulmonary/Chest: No respiratory distress. She has wheezes.  Patient short of breath while speaking. Lung sounds diminished in right lower lung field. Bilateral rales auscultated.  Abdominal: She exhibits no distension.   Genitourinary:  Genitourinary Comments: Exam performed with nurse chaperone present. Large amount of soft stool present in rectal vault. No masses. Normal rectal tone. Soft brown stool removed from rectum.  Musculoskeletal: Normal range of motion.  Neurological: She is alert.  Cranial nerves intact to gross observation. Patient moves extremities without difficulty.  Skin: Skin is warm and dry. She is not diaphoretic. There is erythema.  Erythema and scarring over anterior thorax w/ radiation changes.  Psychiatric: She has a normal mood and affect. Her behavior is normal. Judgment and thought content normal.  Nursing note and vitals reviewed.   ED Course  Fecal disimpaction Date/Time: 03/15/2017 9:31 PM Performed by: Albesa Seen Authorized by: Langston Masker B  Consent: Verbal consent obtained. Consent given by: patient Patient understanding: patient states understanding of the procedure being performed Patient consent: the patient's understanding of the procedure matches consent given Procedure consent: procedure consent matches procedure scheduled Relevant documents present and verified: Verbal consent. Comments: Patient tolerated procedure well. Soft stool present in rectal full. Minimal stool disimpacted due to soft stool in rectal vault.     MDM  Patient to be admitted for AKI and pleural effusions, likely malignant 2/2 patient's breast cancer diagnosis. CT chest demonstrates bilateral pleural effusions. CT abdomen and pelvis with no acute abnormality, but demonstrates dilation of rectum due  to stool volume. Fecal disimpact with attempted emergency department prior to admission. Patient tolerated well.       Albesa Seen, PA-C 03/16/17 Alroy Bailiff    Gareth Morgan, MD 03/16/17 1229

## 2017-03-15 NOTE — H&P (Signed)
History and Physical    [name] YQI:[id number] DOB: 05-14-1946 DOA: 03/15/2017  PCP: [name], MD   Patient coming from: Home.  I have personally briefly reviewed patient's old medical records in [place]  Chief Complaint: Syncopal episode.  HPI: [name] is a 71 y.o. female with medical history significant of osteoarthritis, GERD, hypertension, shortness of breath, sleep apnea not on CPAP, history of stroke, metastatic breast cancer who is on hospice and is coming to the emergency department after passing out at home while trying to have a bowel movement. She complains of  constipation for about 11-12 days. She states that prior to having LOC, she felt flushed and warm. Per family members, the patient was almost out of for about 2 minutes. She complains of worsening dyspnea, transient chest pressure while straining, shortness of breath, dizziness, orthopnea, nausea, abdominal pain, decreased appetite and night sweats. She also mentions that she has been having a sinus infection for the past 3 weeks. She denies GU symptoms, polyuria, polydipsia or blurry vision.   ED Course: Initial vital signs temperature 98.70F, pulse 93, respiration 26, blood pressure 161/87 and O2 sat 98%. She received normal saline 1 L bolus, Percocet 10/18/2023 one tablet by mouth,Zofran4 mg IVP,a DuoNeb and hydromorphone 1 mg IVP.   WBC was 5.3, hemoglobin 9.6 g/dL and platelets 268. Her sodium 136, potassium 5.8,, Chloride 101, bicarbonate 21 mmol/L. Her BUN was 46, glucose 91 and creatinine 2.57 mg/dL (most recent baseline around 1.7-1.8).   Imaging: Chest radiograph, CT chest and abdomen/pelvis showed large right pleural effusion, moderate left pleural effusion, increased mass on both breasts, bilateral axillary nodes, new liver metastases, gallbladder sludge among other findings. Please see images sent for radiology report for further detail.  Review of Systems: As per HPI otherwise 10  point review of systems negative.    Past Medical History:  Diagnosis Date  . Arthritis   . Breast cancer ([place])    right  . Family history of breast cancer   . GERD (gastroesophageal reflux disease)   . Hypertension   . Shortness of breath   . Sleep apnea   . Stroke [facility])     Past Surgical History:  Procedure Laterality Date  . IR CV LINE INJECTION  11/28/2016  . IR GENERIC HISTORICAL  06/13/2016   IR US GUIDE VASC ACCESS LEFT 06/13/2016 Corrie Mckusick, DO [facility]-INTERV RAD  . IR GENERIC HISTORICAL  06/13/2016   IR FLUORO GUIDE PORT INSERTION LEFT 06/13/2016 Corrie Mckusick, DO [facility]-INTER[facility]  . NO PAST SURGERIES       reports that she quit smoking about 43 years ago. Her smoking use included Cigarettes. She started smoking about 48 years ago. She has a 0.50 pack-year smoking history. She has never used smokeless tobacco. She reports that she does not drink alcohol or use drugs.  No Known Allergies  Family History  Problem Relation Age of Onset  . Heart disease Mother   . Cancer Father 70       throat cancer  . Breast cancer Cousin        paternal first cousin dx in her late 30s-early 60s  . Cancer Sister        "cancer down below"    Prior to Admission medications   Medication Sig Start Date End Date Taking? Authorizing Provider  lactulose (CHRONULAC) 10 GM/15ML solution Take 15-30 mLs (10-20 g total) by mouth every 4 (four) hours as needed for mild constipation. 02/21/17  Yes Truitt Merle, MD  lidocaine-prilocaine (EMLA) cream Apply to port @ 1 hour prior to treatment. 06/19/16  Yes Truitt Merle, MD  metoprolol tartrate (LOPRESSOR) 25 MG tablet TAKE 1 TABLET EVERY DAY 07/09/16  Yes [name], MD  ondansetron (ZOFRAN) 8 MG tablet Take by mouth every 8 (eight) hours as needed for nausea or vomiting.   Yes [provider]  oxyCODONE (OXYCONTIN) 10 mg 12 hr tablet Take 1 tablet (10 mg total) by mouth every 12 (twelve) hours. 02/19/17  Yes Gery Pray, MD    oxyCODONE-acetaminophen (PERCOCET/ROXICET) 5-325 MG tablet Take 1 tablet by mouth every 4 (four) hours as needed for severe pain. 02/19/17  Yes Gery Pray, MD  polyethylene glycol [facility]) packet Take 17 g by mouth daily. Start by taking it 4 times a day for the next 4 days. And then definitely continue once a day. 02/20/17  Yes Fredia Sorrow, MD  prochlorperazine (COMPAZINE) 10 MG tablet Take 10 mg by mouth every 6 (six) hours as needed for nausea or vomiting.   Yes [provider]  sucralfate (CARAFATE) 1 GM/10ML suspension Take 10 mLs (1 g total) by mouth 4 (four) times daily -  with meals and at bedtime. 01/20/17  Yes Duffy Bruce, MD  traMADol (ULTRAM) 50 MG tablet Take 1 tablet (50 mg total) by mouth every 8 (eight) hours as needed. 12/30/16  Yes Gery Pray, MD  amLODipine (NORVASC) 10 MG tablet Take 1 tablet (10 mg total) by mouth daily. Patient not taking: Reported on 02/19/2017 09/25/16   [name], MD  aspirin EC 81 MG tablet Take 1 tablet (81 mg total) by mouth daily. Patient not taking: Reported on 02/19/2017 04/27/14   Rosalin Hawking, MD  atorvastatin (LIPITOR) 20 MG tablet TAKE 1 TABLET EVERY DAY Patient not taking: Reported on 02/20/2017 05/28/16   [name], MD  levofloxacin (LEVAQUIN) 500 MG tablet Take 1 tablet (500 mg total) by mouth daily. Patient not taking: Reported on 02/20/2017 02/12/17   Ward, Delice Bison, DO  mirtazapine (REMERON) 15 MG tablet Take 1 tablet (15 mg total) by mouth at bedtime. Patient not taking: Reported on 02/20/2017 01/30/17   Truitt Merle, MD  ranitidine (ZANTAC) 150 MG capsule Take 1 capsule (150 mg total) by mouth daily. 01/20/17 01/30/17  Duffy Bruce, MD    Physical Exam: Vitals:   03/15/17 1557 03/15/17 1604 03/15/17 1833 03/15/17 2001  BP:  (!) 148/93 (!) 176/110 (!) 158/85  Pulse: 96 91 (!) 106 (!) 102  Resp: (!) 24 20 18 18   Temp:    98.7 F (37.1 C)  TempSrc:    Oral  SpO2: 100% 100% 96% 95%    Constitutional: Looks  chronically ill, bit is in NAD, calm, comfortable Eyes: PERRL, lids and conjunctivae normal ENMT: Mucous membranes and lips are dry. Posterior pharynx clear of any exudate or lesions. Neck: normal, supple, no masses, no thyromegaly Respiratory: Decreased breath sounds on both bases, no wheezing, no crackles. Normal respiratory effort. No accessory muscle use.  Cardiovascular: Regular rate and rhythm, no murmurs / rubs / gallops. No extremity edema. 2+ pedal pulses. No carotid bruits.  Abdomen: Soft, mild diffuse tenderness, no guarding/rebound/masses palpated. No hepatosplenomegaly. Bowel sounds positive.  Musculoskeletal: no clubbing / cyanosis. Good ROM, no contractures. Normal muscle tone.  Skin: Positive erythema on anterior chest wall from radiation therapy. Neurologic: CN 2-12 grossly intact. Sensation intact, DTR normal. Strength 5/5 in all 4.  Psychiatric: Normal judgment and insight. Alert and  oriented x 4. Normal mood.    Labs on Admission: I have personally reviewed following labs and imaging studies  CBC:  Recent Labs Lab 03/15/17 1529  WBC 5.3  HGB 9.6*  HCT 31.7*  MCV 74.2*  PLT 528   Basic Metabolic Panel:  Recent Labs Lab 03/15/17 1416  NA 136  K 5.8*  CL 101  CO2 21*  GLUCOSE 91  BUN 46*  CREATININE 2.57*  CALCIUM 9.2   GFR: CrCl cannot be calculated (Unknown ideal weight.). Liver Function Tests: No results for input(s): AST, ALT, ALKPHOS, BILITOT, PROT, ALBUMIN in the last 168 hours. No results for input(s): LIPASE, AMYLASE in the last 168 hours. No results for input(s): AMMONIA in the last 168 hours. Coagulation Profile: No results for input(s): INR, PROTIME in the last 168 hours. Cardiac Enzymes: No results for input(s): CKTOTAL, CKMB, CKMBINDEX, TROPONINI in the last 168 hours. BNP (last 3 results) No results for input(s): PROBNP in the last 8760 hours. HbA1C: No results for input(s): HGBA1C in the last 72 hours. CBG: No results for  input(s): GLUCAP in the last 168 hours. Lipid Profile: No results for input(s): CHOL, HDL, LDLCALC, TRIG, CHOLHDL, LDLDIRECT in the last 72 hours. Thyroid Function Tests: No results for input(s): TSH, T4TOTAL, FREET4, T3FREE, THYROIDAB in the last 72 hours. Anemia Panel: No results for input(s): VITAMINB12, FOLATE, FERRITIN, TIBC, IRON, RETICCTPCT in the last 72 hours. Urine analysis:    Component Value Date/Time   COLORURINE YELLOW 03/15/2017 1758   APPEARANCEUR HAZY (A) 03/15/2017 1758   LABSPEC 1.016 03/15/2017 1758   PHURINE 5.0 03/15/2017 1758   GLUCOSEU NEGATIVE 03/15/2017 1758   HGBUR NEGATIVE 03/15/2017 1758   BILIRUBINUR NEGATIVE 03/15/2017 1758   KETONESUR 5 (A) 03/15/2017 1758   PROTEINUR NEGATIVE 03/15/2017 1758   UROBILINOGEN 1.0 12/12/2011 2300   NITRITE NEGATIVE 03/15/2017 1758   LEUKOCYTESUR TRACE (A) 03/15/2017 1758    Radiological Exams on Admission: Ct Abdomen Pelvis Wo Contrast  Result Date: 03/15/2017 CLINICAL DATA:  Metastatic right breast cancer. No bowel movements. Worsening bilateral pleural effusions. EXAM: CT CHEST, ABDOMEN AND PELVIS WITHOUT CONTRAST TECHNIQUE: Multidetector CT imaging of the chest, abdomen and pelvis was performed following the standard protocol without IV contrast. COMPARISON:  Chest radiograph from earlier today. 02/20/2017 CT abdomen/pelvis. 11/27/2016 PET-CT. FINDINGS: CT CHEST FINDINGS Cardiovascular: Normal heart size. Trace pericardial effusion/thickening, slightly increased. Left anterior descending coronary atherosclerosis. Left internal jugular MediPort terminates in the lower third of the superior vena cava. Atherosclerotic nonaneurysmal thoracic aorta. Normal caliber pulmonary arteries. Mediastinum/Nodes: No discrete thyroid nodules. Unremarkable esophagus. Multiple enlarged right axillary nodes, not appreciably changed since 11/27/2016 PET-CT, largest 3.5 cm (series 2/ image 17). Multiple enlarged left axillary nodes measuring up  to the 1.7 cm, increased from 1.1 cm. No pathologically enlarged mediastinal or gross hilar nodes on this noncontrast scan. Lungs/Pleura: No pneumothorax. Large right and moderate left dependent pleural effusions, increased bilaterally since 02/20/2017. Near complete right and moderate dependent left lower lobe compressive atelectasis. No discrete lung masses or significant pulmonary nodules in the limited aerated portions of the lungs. Musculoskeletal: No aggressive appearing focal osseous lesions. Marked thoracic spondylosis. Marked skin thickening throughout the bilateral chest wall. Masslike soft tissue density throughout the bilateral breasts and thick patchy diffuse subcutaneous fat stranding throughout the chest, worsened. CT ABDOMEN PELVIS FINDINGS Hepatobiliary: Normal liver size. New segment 7 right liver lobe 1.3 cm hypodense lesion (series 2/ image 45). No additional liver lesions. Distended sludge filled gallbladder with no radiopaque  cholelithiasis, gallbladder wall thickening or pericholecystic fluid . No biliary ductal dilatation. Pancreas: Normal, with no mass or duct dilation. Spleen: Normal size. No mass. Adrenals/Urinary Tract: No discrete adrenal nodules. No renal stones. No hydronephrosis. No contour deforming renal masses. Normal bladder. Stomach/Bowel: Grossly normal stomach. Normal caliber small bowel with no small bowel wall thickening. Normal appendix. Normal caliber large bowel with no large bowel wall thickening or significant pericolonic fat stranding. Minimal sigmoid diverticulosis. Mild to moderate rectal distention with stool up to 7.2 cm diameter. No definite rectal wall thickening . Vascular/Lymphatic: Atherosclerotic nonaneurysmal abdominal aorta . New mild left para-aortic adenopathy measuring up to 1.0 cm (series 2/ image 66). No additional pathologically enlarged abdominopelvic nodes. Reproductive: Stable mildly enlarged myomatous uterus with a few coarsely calcified fibroids.  No adnexal masses. Other: No pneumoperitoneum, ascites or focal fluid collection. Prominent anasarca. Musculoskeletal: No aggressive appearing focal osseous lesions. Moderate lumbar spondylosis. IMPRESSION: 1. Large right and moderate left dependent pleural effusions, significantly increased bilaterally since 02/20/2017 CT abdomen study. Associated prominent bibasilar atelectasis. Trace pericardial effusion/thickening, slightly increased. 2. Increased masslike soft tissue density throughout both breasts and marked skin thickening and thick patchy diffuse subcutaneous fat stranding throughout the bilateral chest wall. Findings are worrisome for a combination of progressive infiltrative bilateral chest wall malignancy and anasarca. 3. Bilateral axillary adenopathy, increased on the left. Mild retroperitoneal adenopathy, increased . 4. New small posterior right liver dome metastasis. 5. Nonobstructive bowel gas pattern. Mild to moderate rectal distention with stool, without specific evidence of stercoral colitis. 6. Distended non-thick walled sludge-filled gallbladder without radiopaque cholelithiasis. No biliary ductal dilatation. Electronically Signed   By: Ilona Sorrel M.D.   On: 03/15/2017 17:49   Dg Chest 2 View  Result Date: 03/15/2017 CLINICAL DATA:  Near syncopal episode at home today. Stage IV breast cancer with bone metastases. Ex-smoker. EXAM: CHEST  2 VIEW COMPARISON:  02/12/2017.  PET-CT dated 11/27/2016. FINDINGS: Interval large right pleural effusion and moderate left pleural effusion with bilateral airspace opacity. Grossly stable heart size and tortuous aorta. The left jugular porta catheter tip remains in the superior vena cava. Thoracic spine degenerative changes. IMPRESSION: 1. Interval large right pleural effusion and moderate-sized left pleural effusion. 2. Interval bilateral atelectasis. Underlying pneumonia cannot be excluded on either side. Electronically Signed   By: Claudie Revering M.D.    On: 03/15/2017 14:37   Ct Chest Wo Contrast  Result Date: 03/15/2017 CLINICAL DATA:  Metastatic right breast cancer. No bowel movements. Worsening bilateral pleural effusions. EXAM: CT CHEST, ABDOMEN AND PELVIS WITHOUT CONTRAST TECHNIQUE: Multidetector CT imaging of the chest, abdomen and pelvis was performed following the standard protocol without IV contrast. COMPARISON:  Chest radiograph from earlier today. 02/20/2017 CT abdomen/pelvis. 11/27/2016 PET-CT. FINDINGS: CT CHEST FINDINGS Cardiovascular: Normal heart size. Trace pericardial effusion/thickening, slightly increased. Left anterior descending coronary atherosclerosis. Left internal jugular MediPort terminates in the lower third of the superior vena cava. Atherosclerotic nonaneurysmal thoracic aorta. Normal caliber pulmonary arteries. Mediastinum/Nodes: No discrete thyroid nodules. Unremarkable esophagus. Multiple enlarged right axillary nodes, not appreciably changed since 11/27/2016 PET-CT, largest 3.5 cm (series 2/ image 17). Multiple enlarged left axillary nodes measuring up to the 1.7 cm, increased from 1.1 cm. No pathologically enlarged mediastinal or gross hilar nodes on this noncontrast scan. Lungs/Pleura: No pneumothorax. Large right and moderate left dependent pleural effusions, increased bilaterally since 02/20/2017. Near complete right and moderate dependent left lower lobe compressive atelectasis. No discrete lung masses or significant pulmonary nodules in the limited aerated portions  of the lungs. Musculoskeletal: No aggressive appearing focal osseous lesions. Marked thoracic spondylosis. Marked skin thickening throughout the bilateral chest wall. Masslike soft tissue density throughout the bilateral breasts and thick patchy diffuse subcutaneous fat stranding throughout the chest, worsened. CT ABDOMEN PELVIS FINDINGS Hepatobiliary: Normal liver size. New segment 7 right liver lobe 1.3 cm hypodense lesion (series 2/ image 45). No additional  liver lesions. Distended sludge filled gallbladder with no radiopaque cholelithiasis, gallbladder wall thickening or pericholecystic fluid . No biliary ductal dilatation. Pancreas: Normal, with no mass or duct dilation. Spleen: Normal size. No mass. Adrenals/Urinary Tract: No discrete adrenal nodules. No renal stones. No hydronephrosis. No contour deforming renal masses. Normal bladder. Stomach/Bowel: Grossly normal stomach. Normal caliber small bowel with no small bowel wall thickening. Normal appendix. Normal caliber large bowel with no large bowel wall thickening or significant pericolonic fat stranding. Minimal sigmoid diverticulosis. Mild to moderate rectal distention with stool up to 7.2 cm diameter. No definite rectal wall thickening . Vascular/Lymphatic: Atherosclerotic nonaneurysmal abdominal aorta . New mild left para-aortic adenopathy measuring up to 1.0 cm (series 2/ image 66). No additional pathologically enlarged abdominopelvic nodes. Reproductive: Stable mildly enlarged myomatous uterus with a few coarsely calcified fibroids. No adnexal masses. Other: No pneumoperitoneum, ascites or focal fluid collection. Prominent anasarca. Musculoskeletal: No aggressive appearing focal osseous lesions. Moderate lumbar spondylosis. IMPRESSION: 1. Large right and moderate left dependent pleural effusions, significantly increased bilaterally since 02/20/2017 CT abdomen study. Associated prominent bibasilar atelectasis. Trace pericardial effusion/thickening, slightly increased. 2. Increased masslike soft tissue density throughout both breasts and marked skin thickening and thick patchy diffuse subcutaneous fat stranding throughout the bilateral chest wall. Findings are worrisome for a combination of progressive infiltrative bilateral chest wall malignancy and anasarca. 3. Bilateral axillary adenopathy, increased on the left. Mild retroperitoneal adenopathy, increased . 4. New small posterior right liver dome  metastasis. 5. Nonobstructive bowel gas pattern. Mild to moderate rectal distention with stool, without specific evidence of stercoral colitis. 6. Distended non-thick walled sludge-filled gallbladder without radiopaque cholelithiasis. No biliary ductal dilatation. Electronically Signed   By: Ilona Sorrel M.D.   On: 03/15/2017 17:49   06/14/2016 Echo complete ------------------------------------------------------------------- LV EF: 60% -   65%  ------------------------------------------------------------------- Indications:      V58.11 Chemotherapy Evaluation.  ------------------------------------------------------------------- History:   PMH:   Stroke.  Risk factors:  Hypertension.  ------------------------------------------------------------------- Study Conclusions  - Left ventricle: The cavity size was normal. There was moderate   concentric hypertrophy. Systolic function was normal. The   estimated ejection fraction was in the range of 60% to 65%. Wall   motion was normal; there were no regional wall motion   abnormalities. Doppler parameters are consistent with abnormal   left ventricular relaxation (grade 1 diastolic dysfunction).  02/03/2014 carotid Doppler  Summary: Findings suggest 1-39% internal carotid artery stenosis bilaterally. Vertebral arteries are patent with antegrade flow. Velocities in left common carotid artery are elevated to the point that hemodynamically significant stenosis is likely present, which might falsely lower the velocities in the left internal carotid. Additional imaging recommended.  EKG: Independently reviewed.  EKG results not available on the computer, but EDP reports no EKG changes.  Assessment/Plan Principal Problem:   Syncope Likely vasovagal in nature. Observation/telemetry. Continue supplemental oxygen. Trend troponin level. Check echocardiogram in a.m.  Active Problems:   Hyperkalemia Received IV fluids, beta agonist and  fecal disimpaction. Follow-up potassium level in a.m.    Essential hypertension Continue metoprolol 25 mg by mouth daily. Hold amlodipine. Monitor blood pressure.  GERD Continue PPI and Carafate    Constipation Advised to use multiple agents to avoid severe constipation    HLD (hyperlipidemia) Hold atorvastatin while in the hospital. CT scan showing new liver metastases.    OSA (obstructive sleep apnea) Not on BiPAP. Supplemental oxygen while in the hospital.    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 [facility]) Consider palliative care consult.    Anemia in neoplastic disease Monitor hematocrit and hemoglobin.    DVT prophylaxis: Heparin SQ. Code Status: Initially DO NOT RESUSCITATE status, but wants to reverse to full code. Family Communication: Several grandkids and kids by bedside. Disposition Plan: Admit for his syncopal episode workup. Consideration for palliative care evaluation Consults called:  Admission status:  Observation/telemetry.   Reubin Milan MD [facility] Pager [phone]  If 7PM-7AM, please contact night-coverage [email or web address] Password [email or web address]  03/15/2017, 9:04 PM

## 2017-03-15 NOTE — ED Triage Notes (Signed)
Pt from home via EMS after a near syncopal episode. Pt sts that after she had BM this am, she felt like she was going to pass out. Pt continued to feel bad after lying down and called EMS. Pt is stage 4 breast ca with bone mets. Pt is under hospice care but is a DNR and wants all interventions. Pt is A&O and in NAD

## 2017-03-15 NOTE — ED Notes (Signed)
Pt has c/o feeling "hot".   Afebrile by oral temp probe.  She c/o feeling hot mainly to her chest which has the radiation burns.  A small fan has been provided to blow cooler air onto the patient that she states is feeling better.

## 2017-03-16 ENCOUNTER — Other Ambulatory Visit (HOSPITAL_COMMUNITY): Payer: Medicare HMO

## 2017-03-16 ENCOUNTER — Inpatient Hospital Stay (HOSPITAL_COMMUNITY)

## 2017-03-16 DIAGNOSIS — G4733 Obstructive sleep apnea (adult) (pediatric): Secondary | ICD-10-CM | POA: Diagnosis present

## 2017-03-16 DIAGNOSIS — J910 Malignant pleural effusion: Secondary | ICD-10-CM | POA: Diagnosis present

## 2017-03-16 DIAGNOSIS — R590 Localized enlarged lymph nodes: Secondary | ICD-10-CM | POA: Diagnosis present

## 2017-03-16 DIAGNOSIS — R109 Unspecified abdominal pain: Secondary | ICD-10-CM | POA: Diagnosis present

## 2017-03-16 DIAGNOSIS — Z79891 Long term (current) use of opiate analgesic: Secondary | ICD-10-CM | POA: Diagnosis not present

## 2017-03-16 DIAGNOSIS — Z8673 Personal history of transient ischemic attack (TIA), and cerebral infarction without residual deficits: Secondary | ICD-10-CM | POA: Diagnosis not present

## 2017-03-16 DIAGNOSIS — R0602 Shortness of breath: Secondary | ICD-10-CM | POA: Diagnosis present

## 2017-03-16 DIAGNOSIS — I10 Essential (primary) hypertension: Secondary | ICD-10-CM | POA: Diagnosis not present

## 2017-03-16 DIAGNOSIS — C7951 Secondary malignant neoplasm of bone: Secondary | ICD-10-CM | POA: Diagnosis present

## 2017-03-16 DIAGNOSIS — C50911 Malignant neoplasm of unspecified site of right female breast: Secondary | ICD-10-CM | POA: Diagnosis present

## 2017-03-16 DIAGNOSIS — K219 Gastro-esophageal reflux disease without esophagitis: Secondary | ICD-10-CM | POA: Diagnosis present

## 2017-03-16 DIAGNOSIS — R748 Abnormal levels of other serum enzymes: Secondary | ICD-10-CM | POA: Diagnosis present

## 2017-03-16 DIAGNOSIS — C787 Secondary malignant neoplasm of liver and intrahepatic bile duct: Secondary | ICD-10-CM | POA: Diagnosis present

## 2017-03-16 DIAGNOSIS — D630 Anemia in neoplastic disease: Secondary | ICD-10-CM | POA: Diagnosis present

## 2017-03-16 DIAGNOSIS — E785 Hyperlipidemia, unspecified: Secondary | ICD-10-CM | POA: Diagnosis present

## 2017-03-16 DIAGNOSIS — E875 Hyperkalemia: Secondary | ICD-10-CM | POA: Diagnosis not present

## 2017-03-16 DIAGNOSIS — Z66 Do not resuscitate: Secondary | ICD-10-CM | POA: Diagnosis present

## 2017-03-16 DIAGNOSIS — N179 Acute kidney failure, unspecified: Secondary | ICD-10-CM | POA: Diagnosis not present

## 2017-03-16 DIAGNOSIS — K5909 Other constipation: Secondary | ICD-10-CM

## 2017-03-16 DIAGNOSIS — Z79899 Other long term (current) drug therapy: Secondary | ICD-10-CM | POA: Diagnosis not present

## 2017-03-16 DIAGNOSIS — R55 Syncope and collapse: Secondary | ICD-10-CM | POA: Diagnosis not present

## 2017-03-16 DIAGNOSIS — Z808 Family history of malignant neoplasm of other organs or systems: Secondary | ICD-10-CM | POA: Diagnosis not present

## 2017-03-16 DIAGNOSIS — I248 Other forms of acute ischemic heart disease: Secondary | ICD-10-CM | POA: Diagnosis present

## 2017-03-16 DIAGNOSIS — K5900 Constipation, unspecified: Secondary | ICD-10-CM | POA: Diagnosis present

## 2017-03-16 DIAGNOSIS — Z8249 Family history of ischemic heart disease and other diseases of the circulatory system: Secondary | ICD-10-CM | POA: Diagnosis not present

## 2017-03-16 DIAGNOSIS — I361 Nonrheumatic tricuspid (valve) insufficiency: Secondary | ICD-10-CM | POA: Diagnosis not present

## 2017-03-16 DIAGNOSIS — Z87891 Personal history of nicotine dependence: Secondary | ICD-10-CM | POA: Diagnosis not present

## 2017-03-16 LAB — BASIC METABOLIC PANEL
ANION GAP: 14 (ref 5–15)
BUN: 47 mg/dL — AB (ref 6–20)
CO2: 20 mmol/L — AB (ref 22–32)
Calcium: 8.7 mg/dL — ABNORMAL LOW (ref 8.9–10.3)
Chloride: 103 mmol/L (ref 101–111)
Creatinine, Ser: 2.48 mg/dL — ABNORMAL HIGH (ref 0.44–1.00)
GFR calc Af Amer: 22 mL/min — ABNORMAL LOW (ref >60)
GFR calc non Af Amer: 19 mL/min — ABNORMAL LOW (ref >60)
GLUCOSE: 101 mg/dL — AB (ref 65–99)
POTASSIUM: 5.7 mmol/L — AB (ref 3.5–5.1)
Sodium: 137 mmol/L (ref 135–145)

## 2017-03-16 LAB — CBC WITH DIFFERENTIAL/PLATELET
BASOS PCT: 0 %
Basophils Absolute: 0 10*3/uL (ref 0.0–0.1)
EOS PCT: 0 %
Eosinophils Absolute: 0 10*3/uL (ref 0.0–0.7)
HCT: 29.2 % — ABNORMAL LOW (ref 36.0–46.0)
Hemoglobin: 8.7 g/dL — ABNORMAL LOW (ref 12.0–15.0)
Lymphocytes Relative: 14 %
Lymphs Abs: 0.7 10*3/uL (ref 0.7–4.0)
MCH: 22.3 pg — AB (ref 26.0–34.0)
MCHC: 29.8 g/dL — ABNORMAL LOW (ref 30.0–36.0)
MCV: 74.9 fL — AB (ref 78.0–100.0)
MONO ABS: 0.5 10*3/uL (ref 0.1–1.0)
MONOS PCT: 10 %
NEUTROS PCT: 76 %
Neutro Abs: 3.9 10*3/uL (ref 1.7–7.7)
PLATELETS: 267 10*3/uL (ref 150–400)
RBC: 3.9 MIL/uL (ref 3.87–5.11)
RDW: 19.4 % — ABNORMAL HIGH (ref 11.5–15.5)
WBC: 5.1 10*3/uL (ref 4.0–10.5)

## 2017-03-16 LAB — CBG MONITORING, ED: Glucose-Capillary: 98 mg/dL (ref 65–99)

## 2017-03-16 LAB — TROPONIN I
Troponin I: 0.3 ng/mL (ref <0.03)
Troponin I: 0.84 ng/mL (ref <0.03)

## 2017-03-16 MED ORDER — ASPIRIN EC 81 MG PO TBEC
81.0000 mg | DELAYED_RELEASE_TABLET | Freq: Every day | ORAL | Status: DC
  Administered 2017-03-16 – 2017-03-18 (×3): 81 mg via ORAL
  Filled 2017-03-16 (×3): qty 1

## 2017-03-16 MED ORDER — METOPROLOL TARTRATE 25 MG PO TABS
25.0000 mg | ORAL_TABLET | Freq: Every day | ORAL | Status: DC
  Administered 2017-03-16 – 2017-03-18 (×3): 25 mg via ORAL
  Filled 2017-03-16 (×3): qty 1

## 2017-03-16 MED ORDER — OXYCODONE HCL ER 10 MG PO T12A
10.0000 mg | EXTENDED_RELEASE_TABLET | Freq: Two times a day (BID) | ORAL | Status: DC
  Administered 2017-03-16 – 2017-03-18 (×5): 10 mg via ORAL
  Filled 2017-03-16 (×5): qty 1

## 2017-03-16 MED ORDER — OXYCODONE-ACETAMINOPHEN 5-325 MG PO TABS
1.0000 | ORAL_TABLET | ORAL | Status: DC | PRN
  Administered 2017-03-16 – 2017-03-18 (×3): 1 via ORAL
  Filled 2017-03-16 (×2): qty 1

## 2017-03-16 MED ORDER — SUCRALFATE 1 GM/10ML PO SUSP
1.0000 g | Freq: Three times a day (TID) | ORAL | Status: DC
  Administered 2017-03-16 – 2017-03-18 (×8): 1 g via ORAL
  Filled 2017-03-16 (×9): qty 10

## 2017-03-16 MED ORDER — LACTULOSE 10 GM/15ML PO SOLN
10.0000 g | Freq: Three times a day (TID) | ORAL | Status: DC
  Administered 2017-03-16 – 2017-03-17 (×5): 10 g via ORAL
  Filled 2017-03-16 (×5): qty 30

## 2017-03-16 MED ORDER — SENNA 8.6 MG PO TABS
1.0000 | ORAL_TABLET | Freq: Every day | ORAL | Status: DC
  Administered 2017-03-16: 8.6 mg via ORAL
  Filled 2017-03-16 (×2): qty 1

## 2017-03-16 MED ORDER — FAMOTIDINE 20 MG PO TABS
20.0000 mg | ORAL_TABLET | Freq: Two times a day (BID) | ORAL | Status: DC
  Administered 2017-03-16 – 2017-03-17 (×4): 20 mg via ORAL
  Filled 2017-03-16 (×4): qty 1

## 2017-03-16 MED ORDER — SODIUM POLYSTYRENE SULFONATE 15 GM/60ML PO SUSP
15.0000 g | Freq: Once | ORAL | Status: AC
  Administered 2017-03-16: 15 g via ORAL
  Filled 2017-03-16: qty 60

## 2017-03-16 MED ORDER — PROCHLORPERAZINE MALEATE 10 MG PO TABS
10.0000 mg | ORAL_TABLET | Freq: Four times a day (QID) | ORAL | Status: DC | PRN
  Filled 2017-03-16: qty 1

## 2017-03-16 MED ORDER — POLYETHYLENE GLYCOL 3350 17 G PO PACK
17.0000 g | PACK | Freq: Every day | ORAL | Status: DC
  Administered 2017-03-16: 17 g via ORAL
  Filled 2017-03-16 (×2): qty 1

## 2017-03-16 MED ORDER — MIRTAZAPINE 15 MG PO TABS
15.0000 mg | ORAL_TABLET | Freq: Every day | ORAL | Status: DC
  Administered 2017-03-16 – 2017-03-17 (×2): 15 mg via ORAL
  Filled 2017-03-16 (×2): qty 1

## 2017-03-16 NOTE — Procedures (Signed)
Ultrasound-guided therapeutic right thoracentesis performed yielding 0.8 liters of cloudy amber colored fluid. No immediate complications. Follow-up chest x-ray pending.  Patient requested the procedure be terminated at this time secondary to chest pain despite a moderate amount of fluid remaining.  [name] E 1:44 PM 03/16/2017

## 2017-03-16 NOTE — ED Notes (Signed)
Call report to Briana at [phone] at 1300

## 2017-03-16 NOTE — Progress Notes (Signed)
Pole Ojea RN visit at 4:20pm  This is a related and covered GIP admission of 03/16/17 with [facility] diagnosis of Breast Cancer, per Dr. Tomasa Hosteller.  Patient is a FULL CODE.  Family activated EMS after near syncopal event while trying to have a bowel movement.  Patient also complained of chest pain.  [facility] on call RN was notified and went to the home after EMS, but family still wanted EMS to transport to [facility].   Visited with patient/family at bedside.  Patient is feeling "some better since thoracentesis earlier today".  Patient is alert and oriented, but "exhausted".  Many family members are at bedside.  Per husband, "we are talking about her maybe going back to DNR, we don't have any control over this, so we are going to have another discussion".    Patient receiving:  Heparin injection 5,000 units, Dose 5,000 units, Q8H via [place].  Continuous medications:  None.  Patient did receive PRN medications:  Ondansetron (ZOFRAN) injection 4 mg, Dose 4 mg, Q6H PRN via IV at 1309pm; oxyCODONE-acetaminophen (PERCOCET/ROXICET) 5-325 MG per tablet 1 tablet, Dose 1 tablet, Q4H PRN via PO at 1309pm.  Dr. Tomasa Hosteller and Dr. [name] notified of admission.  Transfer summary and medication list placed on shadow chart.  If you have any hospice related questions or concerns, please feel free to call.   Thank you,  Edyth Gunnels, RN, BSN [facility] Liaison [phone]  All hospital liaisons are now on [place].

## 2017-03-16 NOTE — ED Notes (Signed)
Hospitalist paged with troponin results.  Awaiting further orders.

## 2017-03-16 NOTE — ED Notes (Signed)
Fluids stopped.  Will draw Troponin in 75mins.

## 2017-03-16 NOTE — Progress Notes (Signed)
[facility] Liaison:  RN visit  Patient was seen in the ED with family at bedside.  Patient states she is feeling better this morning.   Patient is waiting on being admitted, ED is having to hold patients until availability on the floor.   Patient is alert and oriented.  Patient states she has 2/10 chest pain that has never resolved.  Patient NAD at this time.  I did speak with patient and family about the reversal of code status and what that meant to them.  Patient did not want to talk about it.  Her family was more interested in what a code would actually look like if she had to be coded.  I did speak with them about the same.  They advised that they are going to discuss among themselves and they may want to change back to DNR.  Per Dr. Wendee Beavers, patient will be worked up for syncope, and will get a visit from PT/OT to ensure she is able to ambulate well before going home.  They are going to give her meds to bring potassium levels down and help patient with constipation.   We will continue seeing patient while hospitalized.  If you have any hospital related questions, please feel free to call.  Thank you,  Edyth Gunnels  [facility] Liaison [phone]  All hospital liaisons are now on [place].

## 2017-03-16 NOTE — ED Notes (Signed)
Date and time results received: 03/16/17  0144 am  Test: troponin Critical Value: 0.30  Name of Provider Notified: Palumbo Md  Orders Received? Or Actions Taken?: MD notified.

## 2017-03-16 NOTE — ED Notes (Addendum)
Pt readjusted, updated, and verbalized understanding.

## 2017-03-16 NOTE — ED Notes (Signed)
Pt transported to Ultrasound.  

## 2017-03-16 NOTE — Progress Notes (Addendum)
PROGRESS NOTE    [name]  WUJ:[id number] DOB: [birth year] DOA: 03/15/2017 PCP: [name], MD    Brief Narrative:  71 y.o. female with medical history significant of osteoarthritis, GERD, hypertension, shortness of breath, sleep apnea not on CPAP, history of stroke, metastatic breast cancer who is on hospice and is coming to the emergency department after passing out at home while trying to have a bowel movement.    Assessment & Plan:   Principal Problem:   Syncope - Pt stated she did not pass out. But felt like it.  - Place PT consult  Addendum: Elevated troponin - In context of patient with pleural effusions complaining of shortness of breath not complaining of chest pain. I suspect this is secondary to demand ischemia. Will monitor on telemetry  Active Problems:   Essential hypertension - Stable on Metoprolol    GERD - Pt on Famotidine    Constipation - Pt on Senna and miralax    HLD (hyperlipidemia) -  No chest pain reported.    OSA (obstructive sleep apnea)   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 [facility]) - Pt has further metastasis, reports of pleural effusion and patient complaining of increase in sob. Will place order for thoracentesis    Anemia in neoplastic disease   Hyperkalemia - kayexalate and reassess - Telemetry monitoring  DVT prophylaxis: heparin Code Status: Full Family Communication: d/c family at bedside. Disposition Plan: Pending improvement in condition   Consultants:   none   Procedures: Place order for US guided thoracentesis   Antimicrobials:  None  Subjective: The patient has no new complaints.  Objective: Vitals:   03/16/17 0300 03/16/17 0400 03/16/17 0500 03/16/17 0600  BP: 120/80 139/84 138/67 126/87  Pulse: (!) 104 (!) 108 (!) 109 (!) 110  Resp:      Temp:      TempSrc:      SpO2: 93% 92% 93% 94%    Intake/Output Summary (Last 24 hours) at 03/16/17 0936 Last data filed at  03/16/17 0551  Gross per 24 hour  Intake              500 ml  Output                0 ml  Net              500 ml   There were no vitals filed for this visit.  Examination:  General exam: Appears calm and comfortable, in nad. Respiratory system: equal chest rise, decreased sounds at bases, no wheezes Cardiovascular system: S1 & S2 heard, RRR. No JVD, murmurs, rubs, gallops or clicks. No pedal edema. Gastrointestinal system: Abdomen is nondistended, soft and nontender. No guarding Central nervous system: Alert and oriented. No focal neurological deficits. Extremities: Symmetric 5 x 5 power. Skin: No rashes, lesions or ulcers, on limited exam. Psychiatry:Mood & affect appropriate.     Data Reviewed: I have personally reviewed following labs and imaging studies  CBC:  Recent Labs Lab 03/15/17 1529 03/16/17 0801  WBC 5.3 5.1  NEUTROABS  --  PENDING  HGB 9.6* 8.7*  HCT 31.7* 29.2*  MCV 74.2* 74.9*  PLT 268 956   Basic Metabolic Panel:  Recent Labs Lab 03/15/17 1416 03/16/17 0801  NA 136 137  K 5.8* 5.7*  CL 101 103  CO2 21* 20*  GLUCOSE 91 101*  BUN 46* 47*  CREATININE 2.57* 2.48*  CALCIUM 9.2 8.7*   GFR: CrCl cannot  be calculated (Unknown ideal weight.). Liver Function Tests: No results for input(s): AST, ALT, ALKPHOS, BILITOT, PROT, ALBUMIN in the last 168 hours. No results for input(s): LIPASE, AMYLASE in the last 168 hours. No results for input(s): AMMONIA in the last 168 hours. Coagulation Profile: No results for input(s): INR, PROTIME in the last 168 hours. Cardiac Enzymes:  Recent Labs Lab 03/16/17 0043 03/16/17 0801  TROPONINI 0.30* 0.84*   BNP (last 3 results) No results for input(s): PROBNP in the last 8760 hours. HbA1C: No results for input(s): HGBA1C in the last 72 hours. CBG:  Recent Labs Lab 03/16/17 0611  GLUCAP 98   Lipid Profile: No results for input(s): CHOL, HDL, LDLCALC, TRIG, CHOLHDL, LDLDIRECT in the last 72  hours. Thyroid Function Tests: No results for input(s): TSH, T4TOTAL, FREET4, T3FREE, THYROIDAB in the last 72 hours. Anemia Panel: No results for input(s): VITAMINB12, FOLATE, FERRITIN, TIBC, IRON, RETICCTPCT in the last 72 hours. Sepsis Labs: No results for input(s): PROCALCITON, LATICACIDVEN in the last 168 hours.  No results found for this or any previous visit (from the past 240 hour(s)).       Radiology Studies: Ct Abdomen Pelvis Wo Contrast  Result Date: 03/15/2017 CLINICAL DATA:  Metastatic right breast cancer. No bowel movements. Worsening bilateral pleural effusions. EXAM: CT CHEST, ABDOMEN AND PELVIS WITHOUT CONTRAST TECHNIQUE: Multidetector CT imaging of the chest, abdomen and pelvis was performed following the standard protocol without IV contrast. COMPARISON:  Chest radiograph from earlier today. 02/20/2017 CT abdomen/pelvis. 11/27/2016 PET-CT. FINDINGS: CT CHEST FINDINGS Cardiovascular: Normal heart size. Trace pericardial effusion/thickening, slightly increased. Left anterior descending coronary atherosclerosis. Left internal jugular MediPort terminates in the lower third of the superior vena cava. Atherosclerotic nonaneurysmal thoracic aorta. Normal caliber pulmonary arteries. Mediastinum/Nodes: No discrete thyroid nodules. Unremarkable esophagus. Multiple enlarged right axillary nodes, not appreciably changed since 11/27/2016 PET-CT, largest 3.5 cm (series 2/ image 17). Multiple enlarged left axillary nodes measuring up to the 1.7 cm, increased from 1.1 cm. No pathologically enlarged mediastinal or gross hilar nodes on this noncontrast scan. Lungs/Pleura: No pneumothorax. Large right and moderate left dependent pleural effusions, increased bilaterally since 02/20/2017. Near complete right and moderate dependent left lower lobe compressive atelectasis. No discrete lung masses or significant pulmonary nodules in the limited aerated portions of the lungs. Musculoskeletal: No  aggressive appearing focal osseous lesions. Marked thoracic spondylosis. Marked skin thickening throughout the bilateral chest wall. Masslike soft tissue density throughout the bilateral breasts and thick patchy diffuse subcutaneous fat stranding throughout the chest, worsened. CT ABDOMEN PELVIS FINDINGS Hepatobiliary: Normal liver size. New segment 7 right liver lobe 1.3 cm hypodense lesion (series 2/ image 45). No additional liver lesions. Distended sludge filled gallbladder with no radiopaque cholelithiasis, gallbladder wall thickening or pericholecystic fluid . No biliary ductal dilatation. Pancreas: Normal, with no mass or duct dilation. Spleen: Normal size. No mass. Adrenals/Urinary Tract: No discrete adrenal nodules. No renal stones. No hydronephrosis. No contour deforming renal masses. Normal bladder. Stomach/Bowel: Grossly normal stomach. Normal caliber small bowel with no small bowel wall thickening. Normal appendix. Normal caliber large bowel with no large bowel wall thickening or significant pericolonic fat stranding. Minimal sigmoid diverticulosis. Mild to moderate rectal distention with stool up to 7.2 cm diameter. No definite rectal wall thickening . Vascular/Lymphatic: Atherosclerotic nonaneurysmal abdominal aorta . New mild left para-aortic adenopathy measuring up to 1.0 cm (series 2/ image 66). No additional pathologically enlarged abdominopelvic nodes. Reproductive: Stable mildly enlarged myomatous uterus with a few coarsely calcified fibroids. No adnexal  masses. Other: No pneumoperitoneum, ascites or focal fluid collection. Prominent anasarca. Musculoskeletal: No aggressive appearing focal osseous lesions. Moderate lumbar spondylosis. IMPRESSION: 1. Large right and moderate left dependent pleural effusions, significantly increased bilaterally since 02/20/2017 CT abdomen study. Associated prominent bibasilar atelectasis. Trace pericardial effusion/thickening, slightly increased. 2. Increased  masslike soft tissue density throughout both breasts and marked skin thickening and thick patchy diffuse subcutaneous fat stranding throughout the bilateral chest wall. Findings are worrisome for a combination of progressive infiltrative bilateral chest wall malignancy and anasarca. 3. Bilateral axillary adenopathy, increased on the left. Mild retroperitoneal adenopathy, increased . 4. New small posterior right liver dome metastasis. 5. Nonobstructive bowel gas pattern. Mild to moderate rectal distention with stool, without specific evidence of stercoral colitis. 6. Distended non-thick walled sludge-filled gallbladder without radiopaque cholelithiasis. No biliary ductal dilatation. Electronically Signed   By: Ilona Sorrel M.D.   On: 03/15/2017 17:49   Dg Chest 2 View  Result Date: 03/15/2017 CLINICAL DATA:  Near syncopal episode at home today. Stage IV breast cancer with bone metastases. Ex-smoker. EXAM: CHEST  2 VIEW COMPARISON:  02/12/2017.  PET-CT dated 11/27/2016. FINDINGS: Interval large right pleural effusion and moderate left pleural effusion with bilateral airspace opacity. Grossly stable heart size and tortuous aorta. The left jugular porta catheter tip remains in the superior vena cava. Thoracic spine degenerative changes. IMPRESSION: 1. Interval large right pleural effusion and moderate-sized left pleural effusion. 2. Interval bilateral atelectasis. Underlying pneumonia cannot be excluded on either side. Electronically Signed   By: Claudie Revering M.D.   On: 03/15/2017 14:37   Ct Chest Wo Contrast  Result Date: 03/15/2017 CLINICAL DATA:  Metastatic right breast cancer. No bowel movements. Worsening bilateral pleural effusions. EXAM: CT CHEST, ABDOMEN AND PELVIS WITHOUT CONTRAST TECHNIQUE: Multidetector CT imaging of the chest, abdomen and pelvis was performed following the standard protocol without IV contrast. COMPARISON:  Chest radiograph from earlier today. 02/20/2017 CT abdomen/pelvis. 11/27/2016  PET-CT. FINDINGS: CT CHEST FINDINGS Cardiovascular: Normal heart size. Trace pericardial effusion/thickening, slightly increased. Left anterior descending coronary atherosclerosis. Left internal jugular MediPort terminates in the lower third of the superior vena cava. Atherosclerotic nonaneurysmal thoracic aorta. Normal caliber pulmonary arteries. Mediastinum/Nodes: No discrete thyroid nodules. Unremarkable esophagus. Multiple enlarged right axillary nodes, not appreciably changed since 11/27/2016 PET-CT, largest 3.5 cm (series 2/ image 17). Multiple enlarged left axillary nodes measuring up to the 1.7 cm, increased from 1.1 cm. No pathologically enlarged mediastinal or gross hilar nodes on this noncontrast scan. Lungs/Pleura: No pneumothorax. Large right and moderate left dependent pleural effusions, increased bilaterally since 02/20/2017. Near complete right and moderate dependent left lower lobe compressive atelectasis. No discrete lung masses or significant pulmonary nodules in the limited aerated portions of the lungs. Musculoskeletal: No aggressive appearing focal osseous lesions. Marked thoracic spondylosis. Marked skin thickening throughout the bilateral chest wall. Masslike soft tissue density throughout the bilateral breasts and thick patchy diffuse subcutaneous fat stranding throughout the chest, worsened. CT ABDOMEN PELVIS FINDINGS Hepatobiliary: Normal liver size. New segment 7 right liver lobe 1.3 cm hypodense lesion (series 2/ image 45). No additional liver lesions. Distended sludge filled gallbladder with no radiopaque cholelithiasis, gallbladder wall thickening or pericholecystic fluid . No biliary ductal dilatation. Pancreas: Normal, with no mass or duct dilation. Spleen: Normal size. No mass. Adrenals/Urinary Tract: No discrete adrenal nodules. No renal stones. No hydronephrosis. No contour deforming renal masses. Normal bladder. Stomach/Bowel: Grossly normal stomach. Normal caliber small bowel  with no small bowel wall thickening. Normal appendix. Normal caliber  large bowel with no large bowel wall thickening or significant pericolonic fat stranding. Minimal sigmoid diverticulosis. Mild to moderate rectal distention with stool up to 7.2 cm diameter. No definite rectal wall thickening . Vascular/Lymphatic: Atherosclerotic nonaneurysmal abdominal aorta . New mild left para-aortic adenopathy measuring up to 1.0 cm (series 2/ image 66). No additional pathologically enlarged abdominopelvic nodes. Reproductive: Stable mildly enlarged myomatous uterus with a few coarsely calcified fibroids. No adnexal masses. Other: No pneumoperitoneum, ascites or focal fluid collection. Prominent anasarca. Musculoskeletal: No aggressive appearing focal osseous lesions. Moderate lumbar spondylosis. IMPRESSION: 1. Large right and moderate left dependent pleural effusions, significantly increased bilaterally since 02/20/2017 CT abdomen study. Associated prominent bibasilar atelectasis. Trace pericardial effusion/thickening, slightly increased. 2. Increased masslike soft tissue density throughout both breasts and marked skin thickening and thick patchy diffuse subcutaneous fat stranding throughout the bilateral chest wall. Findings are worrisome for a combination of progressive infiltrative bilateral chest wall malignancy and anasarca. 3. Bilateral axillary adenopathy, increased on the left. Mild retroperitoneal adenopathy, increased . 4. New small posterior right liver dome metastasis. 5. Nonobstructive bowel gas pattern. Mild to moderate rectal distention with stool, without specific evidence of stercoral colitis. 6. Distended non-thick walled sludge-filled gallbladder without radiopaque cholelithiasis. No biliary ductal dilatation. Electronically Signed   By: Ilona Sorrel M.D.   On: 03/15/2017 17:49    Scheduled Meds: . aspirin EC  81 mg Oral Daily  . famotidine  20 mg Oral BID  . heparin  5,000 Units Subcutaneous Q8H  .  lactulose  10 g Oral TID  . lidocaine  1 application Urethral Once  . metoprolol tartrate  25 mg Oral Daily  . mirtazapine  15 mg Oral QHS  . oxyCODONE  10 mg Oral Q12H  . polyethylene glycol  17 g Oral Daily  . senna  1 tablet Oral Daily  . sodium chloride flush  3 mL Intravenous Q12H  . sucralfate  1 g Oral TID WC & HS   Continuous Infusions:   LOS: 0 days    Time spent: > 35 minutes  Velvet Bathe, MD [facility] Pager [phone]  If 7PM-7AM, please contact night-coverage [email or web address] Password [email or web address] 03/16/2017, 9:36 AM

## 2017-03-17 ENCOUNTER — Inpatient Hospital Stay (HOSPITAL_COMMUNITY)

## 2017-03-17 ENCOUNTER — Other Ambulatory Visit (HOSPITAL_COMMUNITY): Payer: Medicare HMO

## 2017-03-17 DIAGNOSIS — E875 Hyperkalemia: Secondary | ICD-10-CM

## 2017-03-17 DIAGNOSIS — I10 Essential (primary) hypertension: Secondary | ICD-10-CM

## 2017-03-17 DIAGNOSIS — I361 Nonrheumatic tricuspid (valve) insufficiency: Secondary | ICD-10-CM

## 2017-03-17 LAB — BASIC METABOLIC PANEL
Anion gap: 14 (ref 5–15)
BUN: 55 mg/dL — AB (ref 6–20)
CHLORIDE: 104 mmol/L (ref 101–111)
CO2: 21 mmol/L — ABNORMAL LOW (ref 22–32)
Calcium: 8.8 mg/dL — ABNORMAL LOW (ref 8.9–10.3)
Creatinine, Ser: 3.3 mg/dL — ABNORMAL HIGH (ref 0.44–1.00)
GFR calc Af Amer: 15 mL/min — ABNORMAL LOW (ref >60)
GFR calc non Af Amer: 13 mL/min — ABNORMAL LOW (ref >60)
GLUCOSE: 130 mg/dL — AB (ref 65–99)
POTASSIUM: 5.2 mmol/L — AB (ref 3.5–5.1)
Sodium: 139 mmol/L (ref 135–145)

## 2017-03-17 LAB — ECHOCARDIOGRAM COMPLETE
Height: 64 in
WEIGHTICAEL: 2599.66 [oz_av]

## 2017-03-17 LAB — PHOSPHORUS: Phosphorus: 6.1 mg/dL — ABNORMAL HIGH (ref 2.5–4.6)

## 2017-03-17 LAB — CBC
HCT: 32.7 % — ABNORMAL LOW (ref 36.0–46.0)
Hemoglobin: 9.7 g/dL — ABNORMAL LOW (ref 12.0–15.0)
MCH: 22.1 pg — AB (ref 26.0–34.0)
MCHC: 29.7 g/dL — AB (ref 30.0–36.0)
MCV: 74.5 fL — ABNORMAL LOW (ref 78.0–100.0)
Platelets: 270 10*3/uL (ref 150–400)
RBC: 4.39 MIL/uL (ref 3.87–5.11)
RDW: 19.5 % — ABNORMAL HIGH (ref 11.5–15.5)
WBC: 7.3 10*3/uL (ref 4.0–10.5)

## 2017-03-17 LAB — GLUCOSE, CAPILLARY: Glucose-Capillary: 86 mg/dL (ref 65–99)

## 2017-03-17 LAB — TROPONIN I: TROPONIN I: 0.41 ng/mL — AB (ref <0.03)

## 2017-03-17 LAB — MAGNESIUM: Magnesium: 2.4 mg/dL (ref 1.7–2.4)

## 2017-03-17 MED ORDER — SODIUM POLYSTYRENE SULFONATE 15 GM/60ML PO SUSP
15.0000 g | Freq: Once | ORAL | Status: AC
  Administered 2017-03-17: 15 g via ORAL
  Filled 2017-03-17: qty 60

## 2017-03-17 MED ORDER — DEXTROSE-NACL 5-0.9 % IV SOLN
INTRAVENOUS | Status: DC
  Administered 2017-03-17: 17:00:00 via INTRAVENOUS

## 2017-03-17 MED ORDER — SODIUM CHLORIDE 0.9 % IV BOLUS (SEPSIS)
250.0000 mL | Freq: Once | INTRAVENOUS | Status: AC
  Administered 2017-03-17: 250 mL via INTRAVENOUS

## 2017-03-17 NOTE — Progress Notes (Signed)
PROGRESS NOTE    [name]  GEX:[id number] DOB: 1945-07-18 DOA: 03/15/2017 PCP: [name], MD    Brief Narrative:  71 y.o. female with medical history significant of osteoarthritis, GERD, hypertension, shortness of breath, sleep apnea not on CPAP, history of stroke, metastatic breast cancer who is on hospice and is coming to the emergency department after passing out at home while trying to have a bowel movement.   Hospital stay complicated by AKI secondary to patient not eating or drinking well.   Assessment & Plan:   Principal Problem:   Syncope - Pt stated she did not pass out. But felt like it.  - Place PT consult  Elevated troponin - In context of patient with pleural effusions complaining of shortness of breath not complaining of chest pain. Currently trending down  AKI - secondary to prerenal etiology given elevated bun/creatinine ratio.  - administer fluid bolus and gentle fluid hydration.   Active Problems:   Essential hypertension - Stable on Metoprolol    GERD - Pt on Famotidine    Constipation - Pt on Senna and miralax    HLD (hyperlipidemia) -  No chest pain reported.    OSA (obstructive sleep apnea)   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 [facility]) - Pt has further metastasis, reports of pleural effusion and patient complaining of increase in sob.  - resolved after thoracentesis.     Anemia in neoplastic disease   Hyperkalemia - kayexalate and reassess - Telemetry monitoring  DVT prophylaxis: heparin Code Status: Full Family Communication: d/c family at bedside. Disposition Plan: Pending improvement in condition   Consultants:   none   Procedures: s/p US guided thoracentesis   Antimicrobials:  None  Subjective: No acute issues overnight.  Objective: Vitals:   03/17/17 0635 03/17/17 1030 03/17/17 1328 03/17/17 1512  BP: 105/62 (!) 100/55 (!) 145/113 (!) 112/53  Pulse: 83 81 80 89  Resp: 18 18 20 18    Temp: 97.7 F (36.5 C) 98 F (36.7 C)  97.9 F (36.6 C)  TempSrc: Axillary Oral  Oral  SpO2: 93% 97% 93% 96%  Weight: 73.7 kg (162 lb 7.7 oz)     Height:        Intake/Output Summary (Last 24 hours) at 03/17/17 1532 Last data filed at 03/16/17 1642  Gross per 24 hour  Intake                0 ml  Output                1 ml  Net               -1 ml   Filed Weights   03/16/17 1458 03/17/17 0635  Weight: 74.4 kg (164 lb 0.4 oz) 73.7 kg (162 lb 7.7 oz)    Examination:  General exam: Appears calm and comfortable, in nad. Respiratory system: equal chest rise, decreased sounds at bases, no wheezes Cardiovascular system: S1 & S2 heard, RRR. No JVD, murmurs, rubs, gallops or clicks. No pedal edema. Gastrointestinal system: Abdomen is nondistended, soft and nontender. No guarding Central nervous system: Alert and oriented. No focal neurological deficits. Extremities: Symmetric 5 x 5 power. Skin: No rashes, lesions or ulcers, on limited exam. Psychiatry:Mood & affect appropriate.     Data Reviewed: I have personally reviewed following labs and imaging studies  CBC:  Recent Labs Lab 03/15/17 1529 03/16/17 0801 03/17/17 1418  WBC 5.3 5.1 7.3  NEUTROABS  --  3.9  --   HGB 9.6* 8.7* 9.7*  HCT 31.7* 29.2* 32.7*  MCV 74.2* 74.9* 74.5*  PLT 268 267 948   Basic Metabolic Panel:  Recent Labs Lab 03/15/17 1416 03/16/17 0801 03/17/17 1418  NA 136 137 139  K 5.8* 5.7* 5.2*  CL 101 103 104  CO2 21* 20* 21*  GLUCOSE 91 101* 130*  BUN 46* 47* 55*  CREATININE 2.57* 2.48* 3.30*  CALCIUM 9.2 8.7* 8.8*  MG  --   --  2.4  PHOS  --   --  6.1*   GFR: Estimated Creatinine Clearance: 15.6 mL/min (A) (by C-G formula based on SCr of 3.3 mg/dL (H)). Liver Function Tests: No results for input(s): AST, ALT, ALKPHOS, BILITOT, PROT, ALBUMIN in the last 168 hours. No results for input(s): LIPASE, AMYLASE in the last 168 hours. No results for input(s): AMMONIA in the last 168  hours. Coagulation Profile: No results for input(s): INR, PROTIME in the last 168 hours. Cardiac Enzymes:  Recent Labs Lab 03/16/17 0043 03/16/17 0801 03/17/17 1418  TROPONINI 0.30* 0.84* 0.41*   BNP (last 3 results) No results for input(s): PROBNP in the last 8760 hours. HbA1C: No results for input(s): HGBA1C in the last 72 hours. CBG:  Recent Labs Lab 03/16/17 0611 03/17/17 0633  GLUCAP 98 86   Lipid Profile: No results for input(s): CHOL, HDL, LDLCALC, TRIG, CHOLHDL, LDLDIRECT in the last 72 hours. Thyroid Function Tests: No results for input(s): TSH, T4TOTAL, FREET4, T3FREE, THYROIDAB in the last 72 hours. Anemia Panel: No results for input(s): VITAMINB12, FOLATE, FERRITIN, TIBC, IRON, RETICCTPCT in the last 72 hours. Sepsis Labs: No results for input(s): PROCALCITON, LATICACIDVEN in the last 168 hours.  No results found for this or any previous visit (from the past 240 hour(s)).       Radiology Studies: Ct Abdomen Pelvis Wo Contrast  Result Date: 03/15/2017 CLINICAL DATA:  Metastatic right breast cancer. No bowel movements. Worsening bilateral pleural effusions. EXAM: CT CHEST, ABDOMEN AND PELVIS WITHOUT CONTRAST TECHNIQUE: Multidetector CT imaging of the chest, abdomen and pelvis was performed following the standard protocol without IV contrast. COMPARISON:  Chest radiograph from earlier today. 02/20/2017 CT abdomen/pelvis. 11/27/2016 PET-CT. FINDINGS: CT CHEST FINDINGS Cardiovascular: Normal heart size. Trace pericardial effusion/thickening, slightly increased. Left anterior descending coronary atherosclerosis. Left internal jugular MediPort terminates in the lower third of the superior vena cava. Atherosclerotic nonaneurysmal thoracic aorta. Normal caliber pulmonary arteries. Mediastinum/Nodes: No discrete thyroid nodules. Unremarkable esophagus. Multiple enlarged right axillary nodes, not appreciably changed since 11/27/2016 PET-CT, largest 3.5 cm (series 2/ image  17). Multiple enlarged left axillary nodes measuring up to the 1.7 cm, increased from 1.1 cm. No pathologically enlarged mediastinal or gross hilar nodes on this noncontrast scan. Lungs/Pleura: No pneumothorax. Large right and moderate left dependent pleural effusions, increased bilaterally since 02/20/2017. Near complete right and moderate dependent left lower lobe compressive atelectasis. No discrete lung masses or significant pulmonary nodules in the limited aerated portions of the lungs. Musculoskeletal: No aggressive appearing focal osseous lesions. Marked thoracic spondylosis. Marked skin thickening throughout the bilateral chest wall. Masslike soft tissue density throughout the bilateral breasts and thick patchy diffuse subcutaneous fat stranding throughout the chest, worsened. CT ABDOMEN PELVIS FINDINGS Hepatobiliary: Normal liver size. New segment 7 right liver lobe 1.3 cm hypodense lesion (series 2/ image 45). No additional liver lesions. Distended sludge filled gallbladder with no radiopaque cholelithiasis, gallbladder wall thickening or pericholecystic fluid . No biliary ductal dilatation. Pancreas: Normal, with no mass or  duct dilation. Spleen: Normal size. No mass. Adrenals/Urinary Tract: No discrete adrenal nodules. No renal stones. No hydronephrosis. No contour deforming renal masses. Normal bladder. Stomach/Bowel: Grossly normal stomach. Normal caliber small bowel with no small bowel wall thickening. Normal appendix. Normal caliber large bowel with no large bowel wall thickening or significant pericolonic fat stranding. Minimal sigmoid diverticulosis. Mild to moderate rectal distention with stool up to 7.2 cm diameter. No definite rectal wall thickening . Vascular/Lymphatic: Atherosclerotic nonaneurysmal abdominal aorta . New mild left para-aortic adenopathy measuring up to 1.0 cm (series 2/ image 66). No additional pathologically enlarged abdominopelvic nodes. Reproductive: Stable mildly enlarged  myomatous uterus with a few coarsely calcified fibroids. No adnexal masses. Other: No pneumoperitoneum, ascites or focal fluid collection. Prominent anasarca. Musculoskeletal: No aggressive appearing focal osseous lesions. Moderate lumbar spondylosis. IMPRESSION: 1. Large right and moderate left dependent pleural effusions, significantly increased bilaterally since 02/20/2017 CT abdomen study. Associated prominent bibasilar atelectasis. Trace pericardial effusion/thickening, slightly increased. 2. Increased masslike soft tissue density throughout both breasts and marked skin thickening and thick patchy diffuse subcutaneous fat stranding throughout the bilateral chest wall. Findings are worrisome for a combination of progressive infiltrative bilateral chest wall malignancy and anasarca. 3. Bilateral axillary adenopathy, increased on the left. Mild retroperitoneal adenopathy, increased . 4. New small posterior right liver dome metastasis. 5. Nonobstructive bowel gas pattern. Mild to moderate rectal distention with stool, without specific evidence of stercoral colitis. 6. Distended non-thick walled sludge-filled gallbladder without radiopaque cholelithiasis. No biliary ductal dilatation. Electronically Signed   By: Ilona Sorrel M.D.   On: 03/15/2017 17:49   Dg Chest 1 View  Result Date: 03/16/2017 CLINICAL DATA:  Post right-sided thoracentesis. EXAM: CHEST 1 VIEW COMPARISON:  03/15/2017; chest CT - 03/15/2017 FINDINGS: Grossly unchanged cardiac silhouette and mediastinal contours. Stable position of support apparatus. Interval reduction in persistent small right-sided effusion post thoracentesis. No pneumothorax. Improved aeration of the right lung base with persistent bibasilar heterogeneous / consolidative opacities. Pulmonary vasculature remains indistinct with cephalization of flow. Small layering left-sided pleural effusion. No acute osseus abnormalities. IMPRESSION: 1. Interval reduction in persistent small  right-sided effusion post thoracentesis. No pneumothorax. 2. Improved aeration of the right lung base with persistent bibasilar opacities, atelectasis versus infiltrate. 3. No change to slight increase in size of small left-sided effusion. Electronically Signed   By: Sandi Mariscal M.D.   On: 03/16/2017 14:38   Ct Chest Wo Contrast  Result Date: 03/15/2017 CLINICAL DATA:  Metastatic right breast cancer. No bowel movements. Worsening bilateral pleural effusions. EXAM: CT CHEST, ABDOMEN AND PELVIS WITHOUT CONTRAST TECHNIQUE: Multidetector CT imaging of the chest, abdomen and pelvis was performed following the standard protocol without IV contrast. COMPARISON:  Chest radiograph from earlier today. 02/20/2017 CT abdomen/pelvis. 11/27/2016 PET-CT. FINDINGS: CT CHEST FINDINGS Cardiovascular: Normal heart size. Trace pericardial effusion/thickening, slightly increased. Left anterior descending coronary atherosclerosis. Left internal jugular MediPort terminates in the lower third of the superior vena cava. Atherosclerotic nonaneurysmal thoracic aorta. Normal caliber pulmonary arteries. Mediastinum/Nodes: No discrete thyroid nodules. Unremarkable esophagus. Multiple enlarged right axillary nodes, not appreciably changed since 11/27/2016 PET-CT, largest 3.5 cm (series 2/ image 17). Multiple enlarged left axillary nodes measuring up to the 1.7 cm, increased from 1.1 cm. No pathologically enlarged mediastinal or gross hilar nodes on this noncontrast scan. Lungs/Pleura: No pneumothorax. Large right and moderate left dependent pleural effusions, increased bilaterally since 02/20/2017. Near complete right and moderate dependent left lower lobe compressive atelectasis. No discrete lung masses or significant pulmonary nodules in the  limited aerated portions of the lungs. Musculoskeletal: No aggressive appearing focal osseous lesions. Marked thoracic spondylosis. Marked skin thickening throughout the bilateral chest wall. Masslike  soft tissue density throughout the bilateral breasts and thick patchy diffuse subcutaneous fat stranding throughout the chest, worsened. CT ABDOMEN PELVIS FINDINGS Hepatobiliary: Normal liver size. New segment 7 right liver lobe 1.3 cm hypodense lesion (series 2/ image 45). No additional liver lesions. Distended sludge filled gallbladder with no radiopaque cholelithiasis, gallbladder wall thickening or pericholecystic fluid . No biliary ductal dilatation. Pancreas: Normal, with no mass or duct dilation. Spleen: Normal size. No mass. Adrenals/Urinary Tract: No discrete adrenal nodules. No renal stones. No hydronephrosis. No contour deforming renal masses. Normal bladder. Stomach/Bowel: Grossly normal stomach. Normal caliber small bowel with no small bowel wall thickening. Normal appendix. Normal caliber large bowel with no large bowel wall thickening or significant pericolonic fat stranding. Minimal sigmoid diverticulosis. Mild to moderate rectal distention with stool up to 7.2 cm diameter. No definite rectal wall thickening . Vascular/Lymphatic: Atherosclerotic nonaneurysmal abdominal aorta . New mild left para-aortic adenopathy measuring up to 1.0 cm (series 2/ image 66). No additional pathologically enlarged abdominopelvic nodes. Reproductive: Stable mildly enlarged myomatous uterus with a few coarsely calcified fibroids. No adnexal masses. Other: No pneumoperitoneum, ascites or focal fluid collection. Prominent anasarca. Musculoskeletal: No aggressive appearing focal osseous lesions. Moderate lumbar spondylosis. IMPRESSION: 1. Large right and moderate left dependent pleural effusions, significantly increased bilaterally since 02/20/2017 CT abdomen study. Associated prominent bibasilar atelectasis. Trace pericardial effusion/thickening, slightly increased. 2. Increased masslike soft tissue density throughout both breasts and marked skin thickening and thick patchy diffuse subcutaneous fat stranding throughout the  bilateral chest wall. Findings are worrisome for a combination of progressive infiltrative bilateral chest wall malignancy and anasarca. 3. Bilateral axillary adenopathy, increased on the left. Mild retroperitoneal adenopathy, increased . 4. New small posterior right liver dome metastasis. 5. Nonobstructive bowel gas pattern. Mild to moderate rectal distention with stool, without specific evidence of stercoral colitis. 6. Distended non-thick walled sludge-filled gallbladder without radiopaque cholelithiasis. No biliary ductal dilatation. Electronically Signed   By: Ilona Sorrel M.D.   On: 03/15/2017 17:49   US Thoracentesis Asp Pleural Space W/img Guide  Result Date: 03/16/2017 INDICATION: History of metastatic breast cancer, admitted with syncope. Patient was found have a large right-sided pleural effusion. Request is made for therapeutic thoracentesis. EXAM: ULTRASOUND GUIDED THERAPEUTIC THORACENTESIS MEDICATIONS: 1% xylocaine COMPLICATIONS: None immediate. PROCEDURE: An ultrasound guided thoracentesis was thoroughly discussed with the patient and questions answered. The benefits, risks, alternatives and complications were also discussed. The patient understands and wishes to proceed with the procedure. Written consent was obtained. Ultrasound was performed to localize and mark an adequate pocket of fluid in the right chest. The area was then prepped and draped in the normal sterile fashion. 1% xylocaine was used for local anesthesia. Under ultrasound guidance a Safe-T-Centesis catheter was introduced. Thoracentesis was performed. The catheter was removed and a dressing applied. FINDINGS: A total of approximately 0.8 L of cloudy amber fluid was removed. IMPRESSION: Successful ultrasound guided right thoracentesis yielding 0.8 L of pleural fluid. The procedure was terminated early at the request of the patient. She was having some discomfort in her chest from the procedure and wished to stop despite fluid  remaining on ultrasound. Read by: Saverio Danker, PA-C Electronically Signed   By: Sandi Mariscal M.D.   On: 03/16/2017 14:43    Scheduled Meds: . aspirin EC  81 mg Oral Daily  . famotidine  20  mg Oral BID  . heparin  5,000 Units Subcutaneous Q8H  . lactulose  10 g Oral TID  . lidocaine  1 application Urethral Once  . metoprolol tartrate  25 mg Oral Daily  . mirtazapine  15 mg Oral QHS  . oxyCODONE  10 mg Oral Q12H  . polyethylene glycol  17 g Oral Daily  . senna  1 tablet Oral Daily  . sodium chloride flush  3 mL Intravenous Q12H  . sucralfate  1 g Oral TID WC & HS   Continuous Infusions: . dextrose 5 % and 0.9% NaCl    . sodium chloride       LOS: 1 day    Time spent: > 35 minutes  Velvet Bathe, MD [facility] Pager [phone]  If 7PM-7AM, please contact night-coverage [email or web address] Password [email or web address] 03/17/2017, 3:32 PM

## 2017-03-17 NOTE — Progress Notes (Signed)
[place] RN visit at 9:15am  This is a related and covered GIP admission of 03/16/17 with [facility] diagnosis of Breast Cancer, per Dr. Tomasa Hosteller.  Patient is a FULL CODE.  Family activated EMS after near syncopal event while trying to have a bowel movement.  Patient also complained of chest pain.  [facility] on call RN was notified and went to the home after EMS, but family still wanted EMS to transport to [facility].   Checked in with Bedside RN, who stated patient had a good restful night. MD evaluating patient in room and will plan to discharge patient home today. Visited with patient/family at bedside.  Patient is slightly lethargic and oriented, but denies pain at this time stating "just sleepy" with no dizziness/SOB noted.   Initiated Goals of Care conversation with husband who is agreeable to placing patient on DNR status but is still addressing concerns with his children. Husband ready to "get back home", stating may need a Bedside commode after this admission but no other DME needs identified at this time.  Will contact [place] equipment manager to order.   Patient receiving:  Heparin injection 5,000 units, Dose 5,000 units, Q8H via . Aspirin 81mg  tablet PO BID, Lactulose 10GM TID PO, Lopressor 25mg  tablet PO QD, Remeron 15mg  tablet PO Q12hr, Mirlalx 17g packet PO QD, Kayexalate 15g PO QD,.  Continuous medications:  None.  Patient did receive not receive any PRN medications on night shift.  Transfer summary and medication list placed on shadow chart.  [facility] transports all active [facility] patients when ambulance transportation is appropriate (#[phone]).  If you have any hospice related questions or concerns, please feel free to call.   Thank you,  Gar Ponto RN [facility] Liaison [phone]  All hospital liaisons are now on [place].

## 2017-03-17 NOTE — Discharge Summary (Addendum)
Physician Discharge Summary  [name] RJJ:[id number] DOB: [birth year] DOA: 03/15/2017  PCP: [name], MD  Admit date: 03/15/2017 Discharge date: 03/17/2017  Time spent: > 35 minutes  Recommendations for Outpatient Follow-up:  1. Monitor hemoglobin levels 2. Monitor K levels: administer kayexalate prior to discharge.  3. Addendum: Monitor serum creatinine levels.   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 female with medical history significant of osteoarthritis, GERD, hypertension, shortness of breath, sleep apnea not on CPAP, history of stroke, metastatic breast cancer who is on hospice and is coming to the emergency department after passing out at home while trying to have a bowel movement. She complains of  constipation for about 11-12 days. She states that prior to having LOC, she felt flushed and warm.  Hospital Course:  Principal Problem:   Syncope - Pt stated she did not pass out. But felt like it. Occurred while having a BM, as such suspect vaso vagal. I do not feel echocardiogram is warranted given history. Was in the process of getting the echo during exam. This can be followed up with as outpatient.  Elevated troponin - In context of patient with pleural effusions complaining of shortness of breath not complaining of chest pain. I suspect this is secondary to demand ischemia. No chest pain reported. - Echocardiogram obtained prior to discharge. Pt is not a candidate for aggressive therapy given her  metastatic carcinoma, generalized weakness, pleural effusions  AKI - most likely due to poor intake. Pt is more alert after fluid administration. As such I suspect improvement in oral intake. Will discharge patient at her request. Would recommend reassessing serum creatinine.  Active Problems:   Essential hypertension - Stable on Metoprolol    GERD - Pt on Famotidine    Constipation - Pt on Senna and miralax    HLD (hyperlipidemia) -  No chest pain reported.    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 S/p thoracentesis with almost 1 liter removed. Pt breathing better.    Anemia in neoplastic disease    Hyperkalemia - resolved  Procedures:  None  Consultations:  Hospice services  Discharge Exam: Vitals:   03/17/17 0145 03/17/17 0635  BP: 116/79 105/62  Pulse: 85 83  Resp: 18 18  Temp: 97.6 F (36.4 C) 97.7 F (36.5 C)  SpO2: 96% 93%    General: Pt in nad, alert and awake Cardiovascular: rrr, no rubs Respiratory: decreased breath sounds at bases, no wheezes  Discharge Instructions   Discharge Instructions    Call MD for:  severe uncontrolled pain    Complete by:  As directed    Call MD for:  temperature >100.4    Complete by:  As directed    Diet - low sodium heart healthy    Complete by:  As directed    Increase  activity slowly    Complete by:  As directed      Current Discharge Medication List    CONTINUE these medications which have NOT CHANGED   Details  lactulose (CHRONULAC) 10 GM/15ML solution Take 15-30 mLs (10-20 g total) by mouth every 4 (four) hours as needed for mild constipation. Qty: 473 mL, Refills: 2    lidocaine-prilocaine (EMLA) cream Apply to port @ 1 hour prior to treatment. Qty: 30 g, Refills: 1    metoprolol tartrate (LOPRESSOR) 25 MG tablet TAKE 1 TABLET EVERY  DAY Qty: 90 tablet, Refills: 3    ondansetron (ZOFRAN) 8 MG tablet Take by mouth every 8 (eight) hours as needed for nausea or vomiting.    oxyCODONE (OXYCONTIN) 10 mg 12 hr tablet Take 1 tablet (10 mg total) by mouth every 12 (twelve) hours. Qty: 60 tablet, Refills: 0    oxyCODONE-acetaminophen (PERCOCET/ROXICET) 5-325 MG tablet Take 1 tablet by mouth every 4 (four) hours as needed for severe pain. Qty: 60 tablet, Refills: 0    polyethylene glycol (MIRALAX) packet Take 17 g by mouth daily. Start by taking it 4 times a day for the next 4 days. And then definitely continue once a day. Qty: 14 each, Refills: 3    prochlorperazine (COMPAZINE) 10 MG tablet Take 10 mg by mouth every 6 (six) hours as needed for nausea or vomiting.    sucralfate (CARAFATE) 1 GM/10ML suspension Take 10 mLs (1 g total) by mouth 4 (four) times daily -  with meals and at bedtime. Qty: 420 mL, Refills: 0    traMADol (ULTRAM) 50 MG tablet Take 1 tablet (50 mg total) by mouth every 8 (eight) hours as needed. 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 Qty: 90 tablet, Refills: 3    aspirin EC 81 MG tablet Take 1 tablet (81 mg total) by mouth daily. Qty: 90 tablet, Refills: 3    atorvastatin (LIPITOR) 20 MG tablet TAKE 1 TABLET EVERY DAY Qty: 90 tablet, Refills: 3    mirtazapine (REMERON) 15 MG tablet Take 1 tablet (15 mg total) by mouth at bedtime. Qty: 30 tablet, Refills: 2    ranitidine (ZANTAC) 150 MG capsule Take 1 capsule (150 mg total) by mouth daily. Qty: 10 capsule, Refills: 0      STOP taking these medications     levofloxacin (LEVAQUIN) 500 MG tablet        No Known Allergies    The results of significant diagnostics from this hospitalization (including imaging, microbiology, ancillary and laboratory) are listed below for reference.     Significant Diagnostic Studies: Ct Abdomen Pelvis Wo Contrast  Result Date: 03/15/2017 CLINICAL DATA:  Metastatic right breast cancer. No bowel movements. Worsening bilateral pleural effusions. EXAM: CT CHEST, ABDOMEN AND PELVIS WITHOUT CONTRAST TECHNIQUE: Multidetector CT imaging of the chest, abdomen and pelvis was performed following the standard protocol without IV contrast. COMPARISON:  Chest radiograph from earlier today. 02/20/2017 CT abdomen/pelvis. 11/27/2016 PET-CT. FINDINGS: CT CHEST FINDINGS Cardiovascular: Normal heart size. Trace pericardial effusion/thickening, slightly increased. Left anterior descending coronary atherosclerosis. Left internal jugular MediPort terminates in the lower third of the superior vena cava. Atherosclerotic nonaneurysmal thoracic aorta. Normal caliber pulmonary arteries. Mediastinum/Nodes: No discrete thyroid nodules. Unremarkable esophagus. Multiple enlarged right axillary nodes, not appreciably changed since 11/27/2016 PET-CT, largest 3.5 cm (series 2/ image  17). Multiple enlarged left axillary nodes measuring up to the 1.7 cm, increased from 1.1 cm. No pathologically enlarged mediastinal or gross hilar nodes on this noncontrast scan. Lungs/Pleura: No pneumothorax. Large right and moderate left dependent pleural effusions, increased bilaterally since 02/20/2017. Near complete right and moderate dependent left lower lobe compressive atelectasis. No discrete lung masses or significant pulmonary nodules in the limited aerated portions of the lungs. Musculoskeletal: No aggressive appearing focal osseous lesions. Marked thoracic spondylosis. Marked skin thickening throughout the bilateral chest wall. Masslike soft tissue density throughout the bilateral breasts and thick patchy diffuse subcutaneous fat stranding throughout the chest, worsened. CT ABDOMEN PELVIS FINDINGS Hepatobiliary: Normal liver size. New segment 7 right liver lobe 1.3 cm hypodense lesion (series 2/  image 45). No additional liver lesions. Distended sludge filled gallbladder with no radiopaque cholelithiasis, gallbladder wall thickening or pericholecystic fluid . No biliary ductal dilatation. Pancreas: Normal, with no mass or duct dilation. Spleen: Normal size. No mass. Adrenals/Urinary Tract: No discrete adrenal nodules. No renal stones. No hydronephrosis. No contour deforming renal masses. Normal bladder. Stomach/Bowel: Grossly normal stomach. Normal caliber small bowel with no small bowel wall thickening. Normal appendix. Normal caliber large bowel with no large bowel wall thickening or significant pericolonic fat stranding. Minimal sigmoid diverticulosis. Mild to moderate rectal distention with stool up to 7.2 cm diameter. No definite rectal wall thickening . Vascular/Lymphatic: Atherosclerotic nonaneurysmal abdominal aorta . New mild left para-aortic adenopathy measuring up to 1.0 cm (series 2/ image 66). No additional pathologically enlarged abdominopelvic nodes. Reproductive: Stable mildly enlarged myomatous uterus with a few coarsely calcified fibroids. No adnexal masses. Other: No pneumoperitoneum, ascites or focal fluid collection. Prominent anasarca. Musculoskeletal: No aggressive appearing focal osseous lesions. Moderate lumbar spondylosis. IMPRESSION: 1. Large right and moderate left dependent pleural effusions, significantly increased bilaterally since 02/20/2017 CT abdomen study. Associated prominent bibasilar atelectasis. Trace pericardial effusion/thickening, slightly increased. 2. Increased masslike soft tissue density throughout both breasts and marked skin thickening and thick patchy diffuse subcutaneous fat stranding throughout the bilateral chest wall. Findings are worrisome for a combination of progressive infiltrative bilateral chest wall malignancy and anasarca. 3. Bilateral axillary adenopathy, increased on the left. Mild retroperitoneal adenopathy, increased . 4. New small posterior  right liver dome metastasis. 5. Nonobstructive bowel gas pattern. Mild to moderate rectal distention with stool, without specific evidence of stercoral colitis. 6. Distended non-thick walled sludge-filled gallbladder without radiopaque cholelithiasis. No biliary ductal dilatation. Electronically Signed   By: Ilona Sorrel M.D.   On: 03/15/2017 17:49   Dg Chest 1 View  Result Date: 03/16/2017 CLINICAL DATA:  Post right-sided thoracentesis. EXAM: CHEST 1 VIEW COMPARISON:  03/15/2017; chest CT - 03/15/2017 FINDINGS: Grossly unchanged cardiac silhouette and mediastinal contours. Stable position of support apparatus. Interval reduction in persistent small right-sided effusion post thoracentesis. No pneumothorax. Improved aeration of the right lung base with persistent bibasilar heterogeneous / consolidative opacities. Pulmonary vasculature remains indistinct with cephalization of flow. Small layering left-sided pleural effusion. No acute osseus abnormalities. IMPRESSION: 1. Interval reduction in persistent small right-sided effusion post thoracentesis. No pneumothorax. 2. Improved aeration of the right lung base with persistent bibasilar opacities, atelectasis versus infiltrate. 3. No change to slight increase in size of small left-sided effusion. Electronically Signed   By: Sandi Mariscal M.D.   On: 03/16/2017 14:38   Dg Chest 2 View  Result Date: 03/15/2017 CLINICAL DATA:  Near syncopal episode at home today. Stage IV breast cancer with bone metastases. Ex-smoker. EXAM: CHEST  2 VIEW COMPARISON:  02/12/2017.  PET-CT dated 11/27/2016. FINDINGS: Interval large right pleural effusion and moderate left pleural effusion with bilateral airspace opacity. Grossly stable heart size and tortuous aorta. The left jugular porta catheter tip remains in the superior vena cava. Thoracic spine degenerative changes. IMPRESSION: 1. Interval large right pleural effusion and moderate-sized left pleural effusion. 2. Interval bilateral  atelectasis. Underlying pneumonia cannot be excluded on either side. Electronically Signed   By: Claudie Revering M.D.   On: 03/15/2017 14:37   Ct Chest Wo Contrast  Result Date: 03/15/2017 CLINICAL DATA:  Metastatic right breast cancer. No bowel movements. Worsening bilateral pleural effusions. EXAM: CT CHEST, ABDOMEN AND PELVIS WITHOUT CONTRAST TECHNIQUE: Multidetector CT imaging of the chest, abdomen and pelvis was performed following the standard protocol without IV contrast. COMPARISON:  Chest radiograph from earlier today. 02/20/2017 CT abdomen/pelvis. 11/27/2016 PET-CT. FINDINGS: CT CHEST FINDINGS Cardiovascular: Normal heart size. Trace pericardial effusion/thickening, slightly increased. Left anterior descending coronary atherosclerosis. Left internal jugular MediPort terminates in the lower third of the superior vena cava. Atherosclerotic nonaneurysmal thoracic aorta. Normal caliber pulmonary arteries. Mediastinum/Nodes: No discrete thyroid nodules. Unremarkable esophagus. Multiple enlarged right axillary nodes, not appreciably changed since 11/27/2016 PET-CT, largest 3.5 cm (series 2/ image 17). Multiple enlarged left axillary nodes measuring up to the 1.7 cm, increased from 1.1 cm. No pathologically enlarged mediastinal or gross hilar nodes on this noncontrast scan. Lungs/Pleura: No pneumothorax. Large right and moderate left dependent pleural effusions, increased bilaterally since 02/20/2017. Near complete right and moderate dependent left lower lobe compressive atelectasis. No discrete lung masses or significant pulmonary nodules in the limited aerated portions of the lungs. Musculoskeletal: No aggressive appearing focal osseous lesions. Marked thoracic spondylosis. Marked skin thickening throughout the bilateral chest wall. Masslike soft tissue density throughout the bilateral breasts and thick patchy diffuse subcutaneous fat stranding throughout the chest, worsened. CT ABDOMEN PELVIS FINDINGS  Hepatobiliary: Normal liver size. New segment 7 right liver lobe 1.3 cm hypodense lesion (series 2/ image 45). No additional liver lesions. Distended sludge filled gallbladder with no radiopaque cholelithiasis, gallbladder wall thickening or pericholecystic fluid . No biliary ductal dilatation. Pancreas: Normal, with no mass or duct dilation. Spleen: Normal size. No mass. Adrenals/Urinary Tract: No discrete adrenal nodules. No renal stones. No hydronephrosis. No contour deforming renal masses. Normal bladder. Stomach/Bowel: Grossly normal stomach. Normal caliber small bowel with no small bowel wall thickening. Normal appendix. Normal caliber large bowel with no large bowel wall thickening or significant pericolonic fat stranding. Minimal sigmoid diverticulosis. Mild to moderate rectal distention with stool up to 7.2 cm diameter. No definite rectal wall thickening . Vascular/Lymphatic: Atherosclerotic nonaneurysmal abdominal aorta . New mild left para-aortic adenopathy measuring up to 1.0 cm (series 2/ image 66). No additional pathologically enlarged abdominopelvic nodes. Reproductive: Stable mildly enlarged myomatous uterus with a few coarsely calcified fibroids. No adnexal masses. Other: No pneumoperitoneum, ascites or focal fluid collection. Prominent anasarca. Musculoskeletal: No aggressive appearing focal osseous lesions. Moderate lumbar spondylosis. IMPRESSION: 1. Large right and moderate left dependent pleural effusions, significantly increased bilaterally since 02/20/2017 CT abdomen study. Associated prominent bibasilar atelectasis. Trace pericardial effusion/thickening, slightly increased. 2. Increased masslike soft tissue density throughout both breasts and marked skin thickening and thick patchy diffuse subcutaneous fat stranding throughout the bilateral chest wall. Findings are worrisome for a combination of progressive infiltrative bilateral chest wall malignancy and anasarca. 3. Bilateral axillary  adenopathy, increased on the left. Mild retroperitoneal adenopathy, increased . 4. New small posterior right liver dome metastasis. 5. Nonobstructive bowel gas pattern.  Mild to moderate rectal distention with stool, without specific evidence of stercoral colitis. 6. Distended non-thick walled sludge-filled gallbladder without radiopaque cholelithiasis. No biliary ductal dilatation. Electronically Signed   By: Ilona Sorrel M.D.   On: 03/15/2017 17:49   Ct Abdomen Pelvis W Contrast  Result Date: 02/20/2017 CLINICAL DATA:  Breast cancer, constipated EXAM: CT ABDOMEN AND PELVIS WITH CONTRAST TECHNIQUE: Multidetector CT imaging of the abdomen and pelvis was performed using the standard protocol following bolus administration of intravenous contrast. CONTRAST:  69mL ISOVUE-300 IOPAMIDOL (ISOVUE-300) INJECTION 61% COMPARISON:  PET CT 11/27/2016, CT abdomen pelvis 04/24/2016 FINDINGS: Lower chest: Large right breast mass, measuring 9.4 cm, incompletely visualized with overlying skin thickening and edema. Nipple retraction is present. Diffuse nodularity right breast and chest wall. Partially visualized nodule at the inferior aspect of the left breast. Heart size is borderline enlarged. Small bilateral right greater than left pleural effusions. Bronchiectasis in the right lower lobe with partial consolidation. Hepatobiliary: No focal hepatic abnormality. Dilated gallbladder. No calcified stones or biliary dilatation Pancreas: Unremarkable. No pancreatic ductal dilatation or surrounding inflammatory changes. Spleen: Normal in size without focal abnormality. Adrenals/Urinary Tract: Adrenal glands are unremarkable. Kidneys are normal, without renal calculi, focal lesion, or hydronephrosis. Bladder is unremarkable. Stomach/Bowel: Stomach is within normal limits. Appendix appears normal. No evidence of bowel wall thickening, distention, or inflammatory changes. Vascular/Lymphatic: Aortic atherosclerosis. Slight increase in size  of small retroperitoneal lymph nodes. Reproductive: Uterine fibroids.  No adnexal masses. Other: Negative for free air. No ascites. Large amount of subcutaneous edema. Musculoskeletal: No acute osseous abnormality. Faint sclerosis in the left iliac bone corresponding to PET demonstrated skeletal metastatic focus. IMPRESSION: 1. Negative for a bowel obstruction or colon wall thickening. There is moderate stool in the colon. 2. Large partially visualized right breast mass with diffuse infiltrative nodularity of the right breast and lower and lateral chest wall. Diffuse skin thickening with right nipple retraction. Partially visualized nodule inferior aspect of left breast. Findings consistent with known breast cancer. Slight increased retroperitoneal lymph nodes since the prior CT. Faint sclerotic lesion left iliac bone corresponding to pet CT metastatic focus. 3. Small bilateral effusions right greater than left. Bronchiectasis in the right lower low with partial consolidation, possible infiltrate. 4. Dilated gallbladder without calcified stones or biliary dilatation 5. Anasarca 6. Fibroid uterus Electronically Signed   By: Donavan Foil M.D.   On: 02/20/2017 23:08   US Thoracentesis Asp Pleural Space W/img Guide  Result Date: 03/16/2017 INDICATION: History of metastatic breast cancer, admitted with syncope. Patient was found have a large right-sided pleural effusion. Request is made for therapeutic thoracentesis. EXAM: ULTRASOUND GUIDED THERAPEUTIC THORACENTESIS MEDICATIONS: 1% xylocaine COMPLICATIONS: None immediate. PROCEDURE: An ultrasound guided thoracentesis was thoroughly discussed with the patient and questions answered. The benefits, risks, alternatives and complications were also discussed. The patient understands and wishes to proceed with the procedure. Written consent was obtained. Ultrasound was performed to localize and mark an adequate pocket of fluid in the right chest. The area was then prepped  and draped in the normal sterile fashion. 1% xylocaine was used for local anesthesia. Under ultrasound guidance a Safe-T-Centesis catheter was introduced. Thoracentesis was performed. The catheter was removed and a dressing applied. FINDINGS: A total of approximately 0.8 L of cloudy amber fluid was removed. IMPRESSION: Successful ultrasound guided right thoracentesis yielding 0.8 L of pleural fluid. The procedure was terminated early at the request of the patient. She was having some discomfort in her chest from the procedure and wished  to stop despite fluid remaining on ultrasound. Read by: Saverio Danker, PA-C Electronically Signed   By: Sandi Mariscal M.D.   On: 03/16/2017 14:43    Microbiology: No results found for this or any previous visit (from the past 240 hour(s)).   Labs: Basic Metabolic Panel:  Recent Labs Lab 03/15/17 1416 03/16/17 0801  NA 136 137  K 5.8* 5.7*  CL 101 103  CO2 21* 20*  GLUCOSE 91 101*  BUN 46* 47*  CREATININE 2.57* 2.48*  CALCIUM 9.2 8.7*   Liver Function Tests: No results for input(s): AST, ALT, ALKPHOS, BILITOT, PROT, ALBUMIN in the last 168 hours. No results for input(s): LIPASE, AMYLASE in the last 168 hours. No results for input(s): AMMONIA in the last 168 hours. CBC:  Recent Labs Lab 03/15/17 1529 03/16/17 0801  WBC 5.3 5.1  NEUTROABS  --  3.9  HGB 9.6* 8.7*  HCT 31.7* 29.2*  MCV 74.2* 74.9*  PLT 268 267   Cardiac Enzymes:  Recent Labs Lab 03/16/17 0043 03/16/17 0801  TROPONINI 0.30* 0.84*   BNP: BNP (last 3 results) No results for input(s): BNP in the last 8760 hours.  ProBNP (last 3 results) No results for input(s): PROBNP in the last 8760 hours.  CBG:  Recent Labs Lab 03/16/17 0611 03/17/17 0633  GLUCAP 98 86       Signed:  Velvet Bathe MD.  [facility] 03/17/2017, 9:33 AM

## 2017-03-17 NOTE — Progress Notes (Signed)
Pt & family  requested to hold CXR at this time. [name]

## 2017-03-17 NOTE — Progress Notes (Signed)
*  PRELIMINARY RESULTS* Echocardiogram 2D Echocardiogram has been performed.  [name] 03/17/2017, 11:57 AM 

## 2017-03-17 NOTE — Progress Notes (Signed)
[facility] Work note Patient is recent admission to [facility] and was full code upon admission to services. She was resting in bed asleep and husband, grandson, Bonnita Nasuti were present in the room and offered updates. Patient is to be discharged back home today per family. They are coping with changes in patient condition and are working to accept. Family want to care for her at home and have discussed bedside commode with hospital liaison RN, Judeen Hammans . Support, education offered.  Katherina Right, [place]

## 2017-03-17 NOTE — Evaluation (Signed)
Physical Therapy Evaluation Patient Details Name: [name] MRN: [id number] DOB: 10-07-1945 Today's Date: 03/17/2017   History of Present Illness  71 yo female admitted weith syncope. Hx of OA, HTN, SOB, sleep apnea, CVA, met breast cancer.   Clinical Impression  Limited eval. Mod assist for bed mobility. While mobilizing to EOB, noted jerking of UEs and LEs. Pt then began returning to supine. Assisted pt back to bed. Family (daughter) present during session. Daughter did state "pt sleeps 95% of the time at home.". Daughter also stated that pt shakes at times but not quite like she did during today's session. Daughter reported pt is to d/c back home on today. Encouraged daughter to speak with RN if she had concerns. Per chart, pt is set to return home with hospice care.     Follow Up Recommendations Supervision/Assistance - 24 hour (home with hospice per notes)    Equipment Recommendations  Hospital Bed; Taylor; 3n1; Wheelchair   Recommendations for Other Services       Precautions / Restrictions Precautions Precautions: Fall Restrictions Weight Bearing Restrictions: No      Mobility  Bed Mobility Overal bed mobility: Needs Assistance Bed Mobility: Supine to Sit;Sit to Supine     Supine to sit: Mod assist;HOB elevated Sit to supine: Mod assist;HOB elevated   General bed mobility comments: Assist for trunk and LEs. Noted UE/LE jerking once pt got halfway to EOB. Pt barely kept eyes open.   Transfers                 General transfer comment: Deferred for safety reasons due to lethargy, jerking  Ambulation/Gait                Stairs            Wheelchair Mobility    Modified Rankin (Stroke Patients Only)       Balance Overall balance assessment: History of Falls                                           Pertinent Vitals/Pain Pain Assessment: Faces Faces Pain Scale: No hurt    Home Living Family/patient  expects to be discharged to:: Private residence Living Arrangements: Spouse/significant other;Children Available Help at Discharge: Family;Available 24 hours/day Type of Home: House Home Access: Stairs to enter Entrance Stairs-Rails: None Entrance Stairs-Number of Steps: 2 Home Layout: One level Home Equipment: Cane - single point      Prior Function Level of Independence: Needs assistance   Gait / Transfers Assistance Needed: ambulatory with a cane per family           Hand Dominance        Extremity/Trunk Assessment   Upper Extremity Assessment Upper Extremity Assessment: Generalized weakness    Lower Extremity Assessment Lower Extremity Assessment: Generalized weakness    Cervical / Trunk Assessment Cervical / Trunk Assessment: Kyphotic  Communication   Communication: No difficulties  Cognition Arousal/Alertness: Lethargic Behavior During Therapy: Flat affect Overall Cognitive Status: Difficult to assess                                        General Comments      Exercises     Assessment/Plan    PT Assessment Patient needs continued PT services  PT Problem List Decreased strength;Decreased mobility;Decreased activity tolerance;Decreased balance;Decreased knowledge of use of DME       PT Treatment Interventions DME instruction;Gait training;Therapeutic activities;Therapeutic exercise;Patient/family education;Functional mobility training    PT Goals (Current goals can be found in the Care Plan section)  Acute Rehab PT Goals Patient Stated Goal: none stated. daughter stated plan was for home PT Goal Formulation: With family Time For Goal Achievement: 03/31/17 Potential to Achieve Goals: Fair    Frequency Min 2X/week   Barriers to discharge        Co-evaluation               AM-PAC PT "6 Clicks" Daily Activity  Outcome Measure Difficulty turning over in bed (including adjusting bedclothes, sheets and blankets)?:  Unable Difficulty moving from lying on back to sitting on the side of the bed? : Unable Difficulty sitting down on and standing up from a chair with arms (e.g., wheelchair, bedside commode, etc,.)?: Unable Help needed moving to and from a bed to chair (including a wheelchair)?: A Lot Help needed walking in hospital room?: A Lot Help needed climbing 3-5 steps with a railing? : Total 6 Click Score: 8    End of Session   Activity Tolerance: Patient limited by lethargy Patient left: in bed;with call bell/phone within reach;with family/visitor present;with bed alarm set   PT Visit Diagnosis: Muscle weakness (generalized) (M62.81);Difficulty in walking, not elsewhere classified (R26.2)    Time: 1202-1216 PT Time Calculation (min) (ACUTE ONLY): 14 min   Charges:   PT Evaluation $PT Eval Low Complexity: 1 Low     PT G Codes:          Weston Anna, MPT Pager: [phone]

## 2017-03-18 DIAGNOSIS — R55 Syncope and collapse: Secondary | ICD-10-CM

## 2017-03-18 LAB — BASIC METABOLIC PANEL
Anion gap: 14 (ref 5–15)
BUN: 59 mg/dL — ABNORMAL HIGH (ref 6–20)
CALCIUM: 8.7 mg/dL — AB (ref 8.9–10.3)
CO2: 22 mmol/L (ref 22–32)
CREATININE: 3.77 mg/dL — AB (ref 0.44–1.00)
Chloride: 105 mmol/L (ref 101–111)
GFR, EST AFRICAN AMERICAN: 13 mL/min — AB (ref >60)
GFR, EST NON AFRICAN AMERICAN: 11 mL/min — AB (ref >60)
Glucose, Bld: 105 mg/dL — ABNORMAL HIGH (ref 65–99)
Potassium: 5 mmol/L (ref 3.5–5.1)
SODIUM: 141 mmol/L (ref 135–145)

## 2017-03-18 LAB — GLUCOSE, CAPILLARY: GLUCOSE-CAPILLARY: 84 mg/dL (ref 65–99)

## 2017-03-18 MED ORDER — FAMOTIDINE 20 MG PO TABS: 20.0000 mg | ORAL_TABLET | Freq: Every day | ORAL | Status: DC

## 2017-03-18 NOTE — Progress Notes (Signed)
PROGRESS NOTE    [name]  HWE:[id number] DOB: [birth year] DOA: 03/15/2017 PCP: [name], MD    Brief Narrative:  71 y.o. female with medical history significant of osteoarthritis, GERD, hypertension, shortness of breath, sleep apnea not on CPAP, history of stroke, metastatic breast cancer who is on hospice and is coming to the emergency department after passing out at home while trying to have a bowel movement.   Hospital stay complicated by AKI secondary to prerenal etiology as patient's oral intake has been poor.  Assessment & Plan:   Principal Problem:   Syncope - Pt stated she did not pass out. Was reported when patient was having a BM. I suspect that this is secondary to vaso vagal episode.  - Echocardiogram obtained and reporting EF of 60-65%  Elevated troponin - In context of patient with pleural effusions complaining of shortness of breath not complaining of chest pain. Currently trending down - Echo reports normal EF and no regional wall motion abnormalities.   AKI - secondary to prerenal etiology given elevated bun/creatinine ratio.  - Pt is more alert after fluids - I will increase fluid rate today and reassess next am her serum creatinine.   Active Problems:   Essential hypertension - Stable on Metoprolol    GERD - Pt on Famotidine    Constipation - Pt on Senna and miralax - Pt had fecal disimpaction in house.     HLD (hyperlipidemia) -  No chest pain reported.    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 - discussed code status with husband who states that patient wanted dnr status    Anemia in neoplastic disease   Hyperkalemia - resolved after kayexalate administration.  DVT prophylaxis: heparin Code Status: After discussion with husband today.  Patient is to be a DNR.  Family Communication: d/c family at bedside. Disposition Plan: Pending improvement in condition   Consultants:   none   Procedures: s/p US guided thoracentesis, echocardiogram.   Antimicrobials:  None  Subjective: Pt has no new complaints, no acute issues overnight.   Objective: Vitals:   03/17/17 1328 03/17/17 1512 03/17/17 2104 03/18/17 0620  BP: (!) 145/113 (!) 112/53 104/61 110/75  Pulse: 80 89 88 80  Resp: 20 18 16 16   Temp:  97.9 F (36.6 C) 98.1 F (36.7 C) 98.1 F (36.7 C)  TempSrc:  Oral Axillary Axillary  SpO2: 93% 96% 93% 91%  Weight:    74.3 kg (163 lb 12.8 oz)  Height:        Intake/Output Summary (Last 24 hours) at 03/18/17 1007 Last data filed at 03/18/17 0600  Gross per 24 hour  Intake          1380.83 ml  Output                0 ml  Net          1380.83 ml   Filed Weights   03/16/17 1458 03/17/17 0635 03/18/17 0620  Weight: 74.4 kg (164 lb 0.4 oz) 73.7 kg (162 lb 7.7 oz) 74.3 kg (163 lb 12.8 oz)    Examination:  General exam:  in nad, Appears calm and comfortable, Respiratory system:  decreased sounds at bases, no wheezes, equal chest rise, Cardiovascular system: S1 & S2 heard, RRR. No JVD, murmurs, rubs, gallops or  clicks. No pedal edema. Gastrointestinal system: Abdomen is nondistended, soft and nontender. No guarding Central nervous system: Alert and oriented. No focal neurological deficits. Extremities: Symmetric 5 x 5 power. Skin: No rashes, lesions or ulcers, on limited exam. Psychiatry: Mood & affect appropriate.     Data Reviewed: I have personally reviewed following labs and imaging studies  CBC:  Recent Labs Lab 03/15/17 1529 03/16/17 0801 03/17/17 1418  WBC 5.3 5.1 7.3  NEUTROABS  --  3.9  --   HGB 9.6* 8.7* 9.7*  HCT 31.7* 29.2* 32.7*  MCV 74.2* 74.9* 74.5*  PLT 268 267 093   Basic Metabolic Panel:  Recent Labs Lab 03/15/17 1416 03/16/17 0801 03/17/17 1418 03/18/17 0922  NA 136  137 139 141  K 5.8* 5.7* 5.2* 5.0  CL 101 103 104 105  CO2 21* 20* 21* 22  GLUCOSE 91 101* 130* 105*  BUN 46* 47* 55* 59*  CREATININE 2.57* 2.48* 3.30* 3.77*  CALCIUM 9.2 8.7* 8.8* 8.7*  MG  --   --  2.4  --   PHOS  --   --  6.1*  --    GFR: Estimated Creatinine Clearance: 13.7 mL/min (A) (by C-G formula based on SCr of 3.77 mg/dL (H)). Liver Function Tests: No results for input(s): AST, ALT, ALKPHOS, BILITOT, PROT, ALBUMIN in the last 168 hours. No results for input(s): LIPASE, AMYLASE in the last 168 hours. No results for input(s): AMMONIA in the last 168 hours. Coagulation Profile: No results for input(s): INR, PROTIME in the last 168 hours. Cardiac Enzymes:  Recent Labs Lab 03/16/17 0043 03/16/17 0801 03/17/17 1418  TROPONINI 0.30* 0.84* 0.41*   BNP (last 3 results) No results for input(s): PROBNP in the last 8760 hours. HbA1C: No results for input(s): HGBA1C in the last 72 hours. CBG:  Recent Labs Lab 03/16/17 0611 03/17/17 0633 03/18/17 0559  GLUCAP 98 86 84   Lipid Profile: No results for input(s): CHOL, HDL, LDLCALC, TRIG, CHOLHDL, LDLDIRECT in the last 72 hours. Thyroid Function Tests: No results for input(s): TSH, T4TOTAL, FREET4, T3FREE, THYROIDAB in the last 72 hours. Anemia Panel: No results for input(s): VITAMINB12, FOLATE, FERRITIN, TIBC, IRON, RETICCTPCT in the last 72 hours. Sepsis Labs: No results for input(s): PROCALCITON, LATICACIDVEN in the last 168 hours.  No results found for this or any previous visit (from the past 240 hour(s)).       Radiology Studies: Dg Chest 1 View  Result Date: 03/16/2017 CLINICAL DATA:  Post right-sided thoracentesis. EXAM: CHEST 1 VIEW COMPARISON:  03/15/2017; chest CT - 03/15/2017 FINDINGS: Grossly unchanged cardiac silhouette and mediastinal contours. Stable position of support apparatus. Interval reduction in persistent small right-sided effusion post thoracentesis. No pneumothorax. Improved aeration of the  right lung base with persistent bibasilar heterogeneous / consolidative opacities. Pulmonary vasculature remains indistinct with cephalization of flow. Small layering left-sided pleural effusion. No acute osseus abnormalities. IMPRESSION: 1. Interval reduction in persistent small right-sided effusion post thoracentesis. No pneumothorax. 2. Improved aeration of the right lung base with persistent bibasilar opacities, atelectasis versus infiltrate. 3. No change to slight increase in size of small left-sided effusion. Electronically Signed   By: Sandi Mariscal M.D.   On: 03/16/2017 14:38   US Thoracentesis Asp Pleural Space W/img Guide  Result Date: 03/16/2017 INDICATION: History of metastatic breast cancer, admitted with syncope. Patient was found have a large right-sided pleural effusion. Request is made for therapeutic thoracentesis. EXAM: ULTRASOUND GUIDED THERAPEUTIC THORACENTESIS MEDICATIONS: 1% xylocaine COMPLICATIONS: None immediate. PROCEDURE: An  ultrasound guided thoracentesis was thoroughly discussed with the patient and questions answered. The benefits, risks, alternatives and complications were also discussed. The patient understands and wishes to proceed with the procedure. Written consent was obtained. Ultrasound was performed to localize and mark an adequate pocket of fluid in the right chest. The area was then prepped and draped in the normal sterile fashion. 1% xylocaine was used for local anesthesia. Under ultrasound guidance a Safe-T-Centesis catheter was introduced. Thoracentesis was performed. The catheter was removed and a dressing applied. FINDINGS: A total of approximately 0.8 L of cloudy amber fluid was removed. IMPRESSION: Successful ultrasound guided right thoracentesis yielding 0.8 L of pleural fluid. The procedure was terminated early at the request of the patient. She was having some discomfort in her chest from the procedure and wished to stop despite fluid remaining on ultrasound. Read  by: Saverio Danker, PA-C Electronically Signed   By: Sandi Mariscal M.D.   On: 03/16/2017 14:43    Scheduled Meds: . aspirin EC  81 mg Oral Daily  . famotidine  20 mg Oral QHS  . heparin  5,000 Units Subcutaneous Q8H  . lactulose  10 g Oral TID  . metoprolol tartrate  25 mg Oral Daily  . mirtazapine  15 mg Oral QHS  . oxyCODONE  10 mg Oral Q12H  . polyethylene glycol  17 g Oral Daily  . senna  1 tablet Oral Daily  . sodium chloride flush  3 mL Intravenous Q12H  . sucralfate  1 g Oral TID WC & HS   Continuous Infusions: . dextrose 5 % and 0.9% NaCl 50 mL/hr at 03/17/17 1712     LOS: 2 days    Time spent: > 35 minutes  Velvet Bathe, MD [facility] Pager [phone]  If 7PM-7AM, please contact night-coverage [email or web address] Password [email or web address] 03/18/2017, 10:07 AM

## 2017-03-18 NOTE — Progress Notes (Signed)
Tosha from lab notified this RN that she was unable to collect BMP from patient. Multiple attempts with no success. Jeannette Corpus NP notified.

## 2017-03-18 NOTE — Progress Notes (Signed)
[facility] RN visit at 9:20 am  This is a related and covered GIP admission of 03/16/17 with [facility] diagnosis of Breast Cancer, per Dr. Tomasa Hosteller. Patient is a FULL CODE. Family activated EMS after near syncopal event while trying to have a bowel movement. Patient also complained of chest pain. [facility] on call RN was notified and went to the home after EMS, but family still wanted EMS to transport to [facility].   Visited with patient/family at bedside.  Patient did not want to talk this morning.  Patient was alert, but wouldn't answer any questions.  Patient NAD.  Patient appears more ill than she did when I saw her on Sunday.  Patient coloring is pale today.  Per husband, patient couldn't complete PT/OT yesterday, because she is too weak.  Patient is having trouble feeding herself per husband report because of weakness also.  Patient is supposed to discharge home today with hospice.  Patient receiving:  Heparin injection 5,000 Units, Dose 5,000 units, Q8H via [place]; oxyCODONE (OXYCONTIN) 12 hr tablet 10 mg, Dose 10 mg, Q12H via PO.  Continuous medications:  Dextrose 5% - 0.9% sodium chloride infusion, Rate 50 mL/hr, Continuous via IV.  Patient has received no PRN medications today thus far.   We will continue to monitor while patient is in the hospital and anticipate any discharge needs.   Thank you,  Edyth Gunnels, RN, BSN [facility] Liaison [phone]  All hospital liaisons are now on [place].

## 2017-03-18 NOTE — Progress Notes (Signed)
Spoke with pt's husband at bedside concerning discharge plan.  Husband states he is taking pt home with [facility]. Spoke with Amy, [facility] Liaison concerning pt's discharge plan and that pt is a Full code. Hospice Liaison explained that Full code was addressed. Family keeping pt a Full Code. Plan to discharge home today with Hospice.

## 2017-03-18 NOTE — Progress Notes (Signed)
EMS called for transportation to home.

## 2017-04-17 DEATH — deceased

## 2017-06-06 ENCOUNTER — Other Ambulatory Visit: Payer: Self-pay | Admitting: Nurse Practitioner

## 2018-04-18 IMAGING — XA IR CENTRAL VENOUS CATHETER
2 series · 5 of 5 positions shown · non-contrast
Comparison: none

INDICATION: Difficult access of left jugular Port-A-Cath

[Series 2: dsa check · 4 of 16 frames shown]
[frame 3/16]
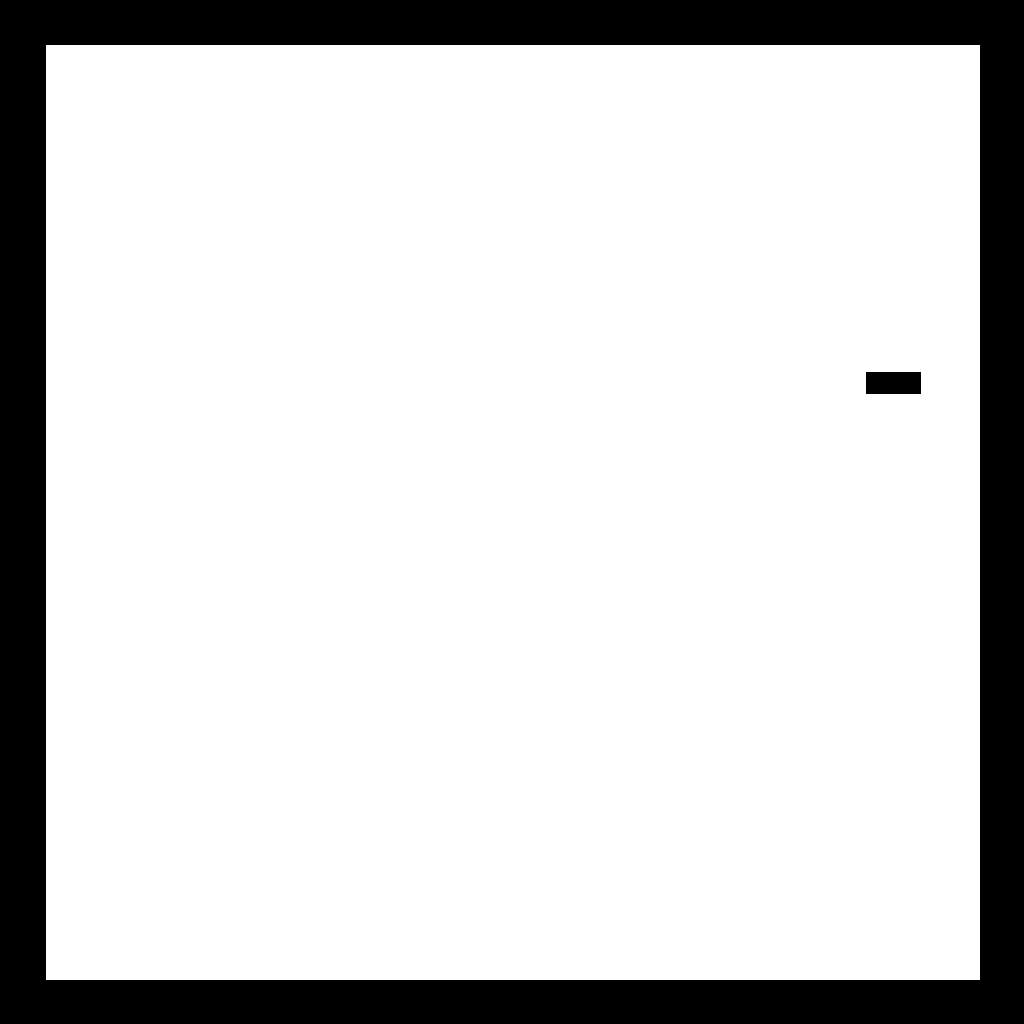
[frame 9/16]
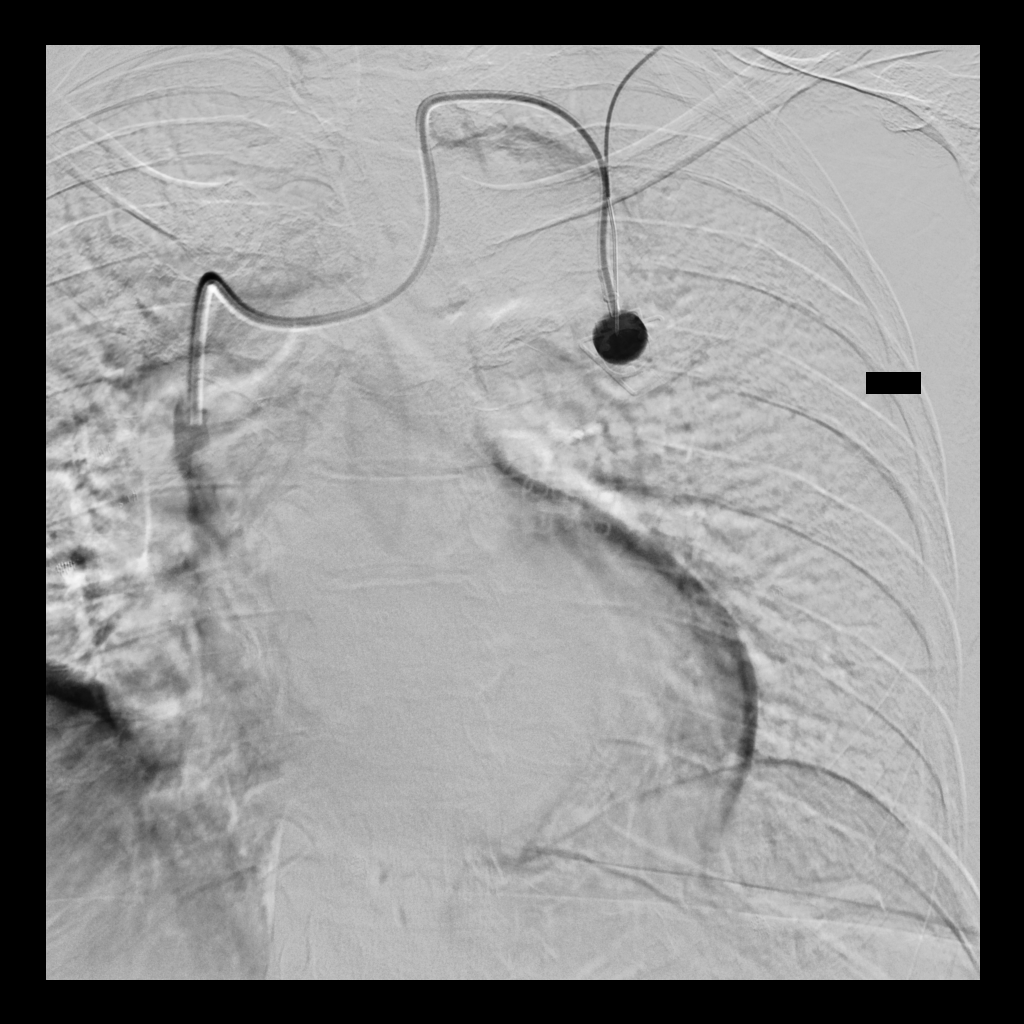
[frame 14/16]
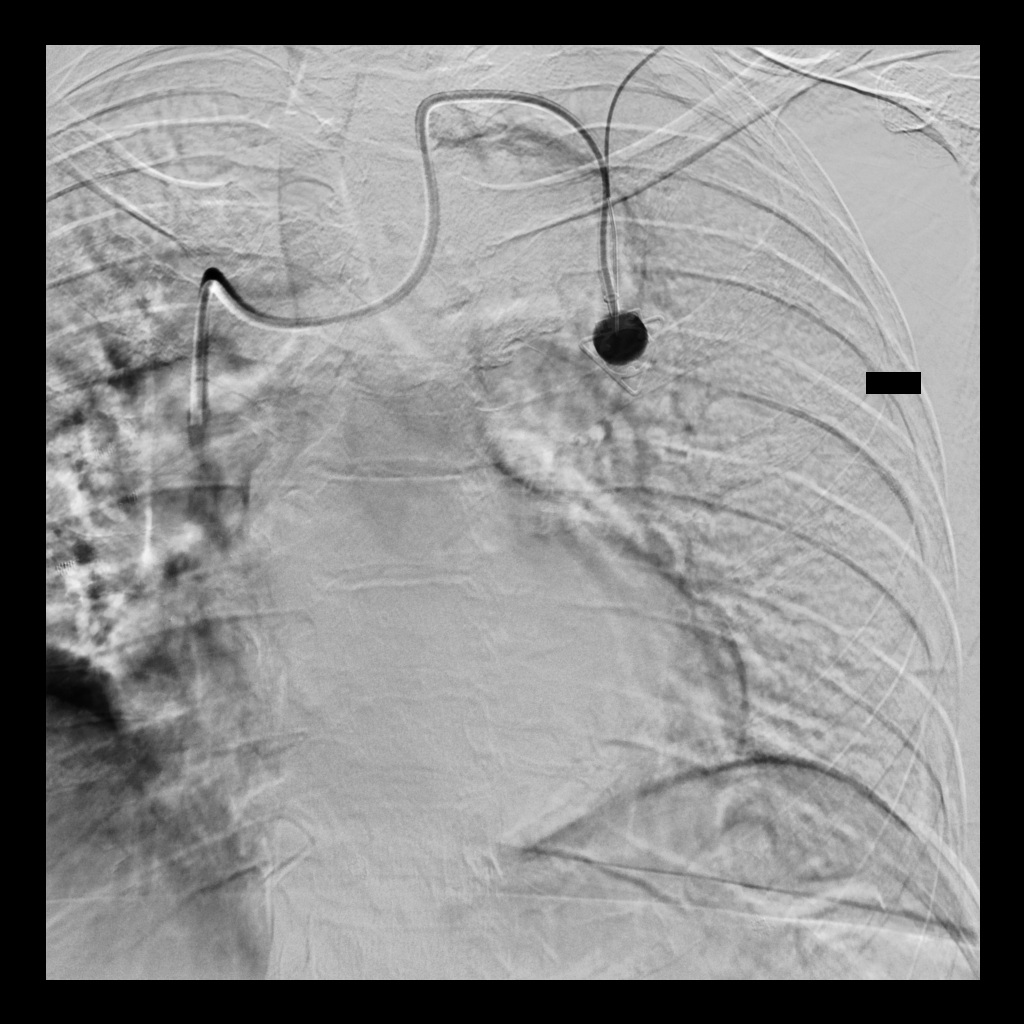
[frame 16/16]
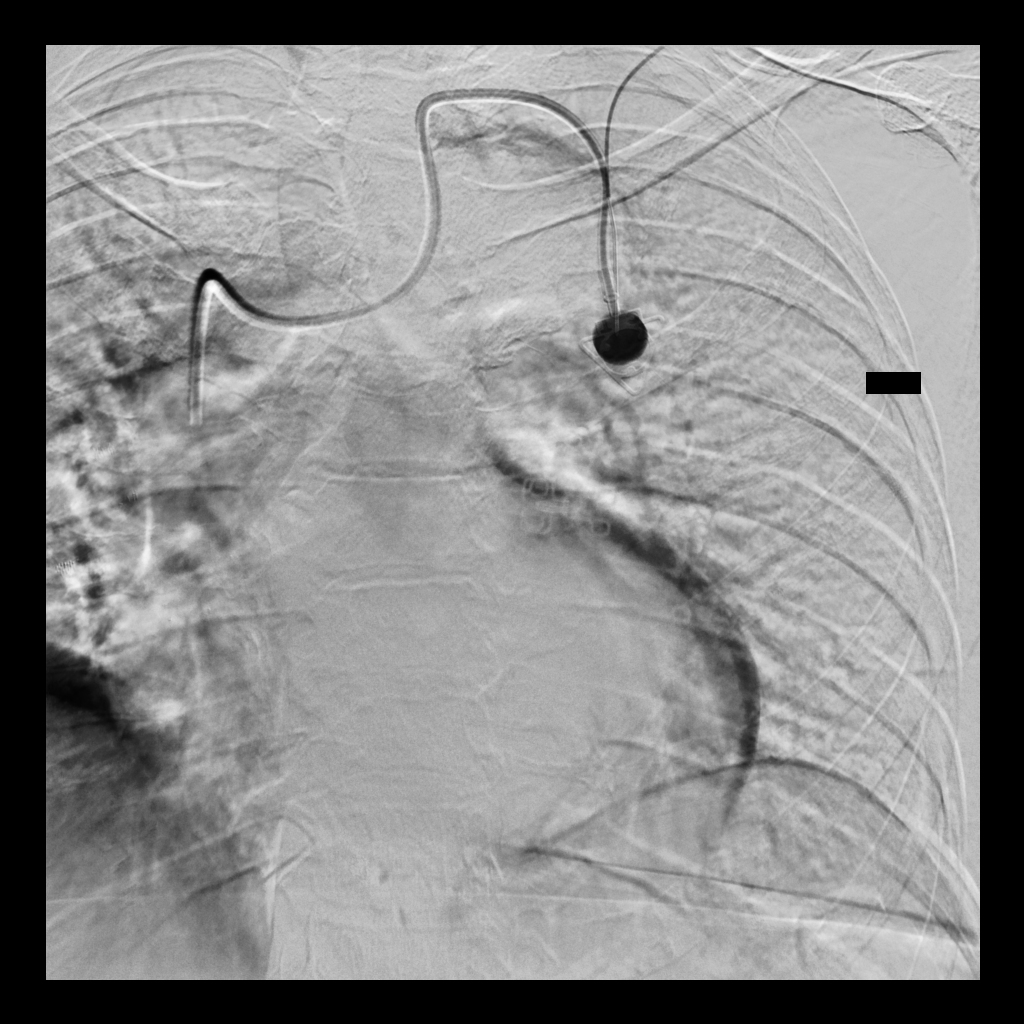

[Series 300: line placements · 1 of 1 slices shown]
[im 1/1]
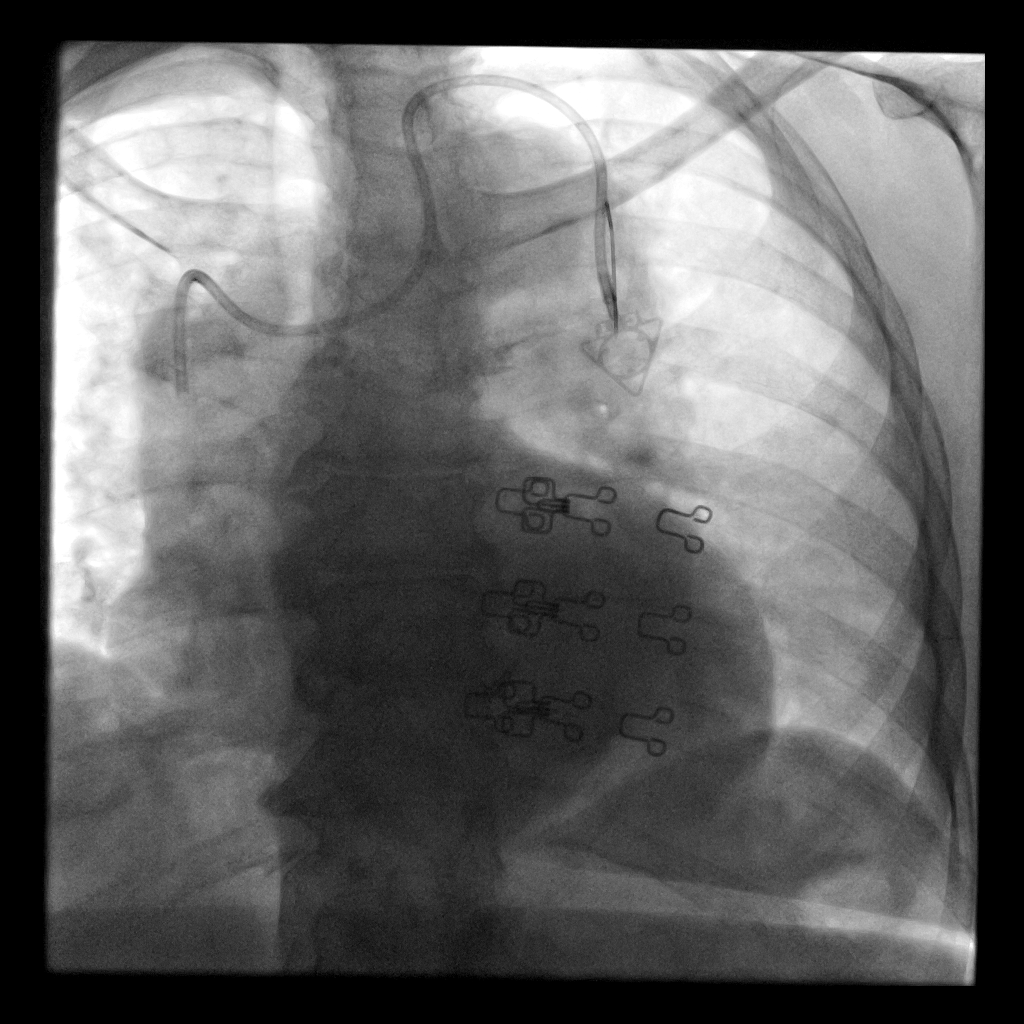

[5 of 5 positions shown; findings below may reference images not displayed]

EXAM:
LEFT JUGULAR PORT-A-CATH INJECTION.

MEDICATIONS:
None.

ANESTHESIA/SEDATION:
None.

FLUOROSCOPY TIME:  Fluoroscopy Time:  minutes 0 seconds (0.3 mGy).

COMPLICATIONS:
None immediate.

PROCEDURE:
Informed written consent was obtained from the patient after a
thorough discussion of the procedural risks, benefits and
alternatives. All questions were addressed. Maximal Sterile Barrier
Technique was utilized including caps, mask, sterile gowns, sterile
gloves, sterile drape, hand hygiene and skin antiseptic. A timeout
was performed prior to the initiation of the procedure.

Contrast was injected into the Port-A-Cath needle and imaging was
obtained.
FINDINGS: The access needle is well positioned within the reservoir. Contrast
fills the reservoir hand catheter. The tip is in the mid SVC.
Contrast fills the SVC and exits into the right atrium. There is no
evidence of fibrin sheath. There is no evidence of contrast
extravasation.
IMPRESSION: The left jugular Port-A-Cath is patent and functioning well without
contrast extravasation, occlusion, or fibrin sheath. The tip is in
the mid SVC.
